# Patient Record
Sex: Male | Born: 1935 | Race: White | Hispanic: No
Health system: Southern US, Community
[De-identification: ages and names within clinical notes are randomized; demographics above are authoritative.]

## PROBLEM LIST (undated history)

## (undated) DIAGNOSIS — F411 Generalized anxiety disorder: Secondary | ICD-10-CM

## (undated) DIAGNOSIS — I10 Essential (primary) hypertension: Secondary | ICD-10-CM

## (undated) DIAGNOSIS — N400 Enlarged prostate without lower urinary tract symptoms: Secondary | ICD-10-CM

## (undated) DIAGNOSIS — I4891 Unspecified atrial fibrillation: Secondary | ICD-10-CM

## (undated) DIAGNOSIS — R351 Nocturia: Secondary | ICD-10-CM

## (undated) DIAGNOSIS — E039 Hypothyroidism, unspecified: Secondary | ICD-10-CM

## (undated) DIAGNOSIS — R599 Enlarged lymph nodes, unspecified: Secondary | ICD-10-CM

## (undated) DIAGNOSIS — J189 Pneumonia, unspecified organism: Secondary | ICD-10-CM

## (undated) DIAGNOSIS — Z87891 Personal history of nicotine dependence: Secondary | ICD-10-CM

## (undated) DIAGNOSIS — I509 Heart failure, unspecified: Secondary | ICD-10-CM

## (undated) DIAGNOSIS — Z950 Presence of cardiac pacemaker: Secondary | ICD-10-CM

## (undated) HISTORY — DX: Pneumonia, unspecified organism: J18.9

## (undated) HISTORY — PX: COLONOSCOPY: SHX174

## (undated) HISTORY — DX: Essential (primary) hypertension: I10

## (undated) HISTORY — DX: Hypothyroidism, unspecified: E03.9

## (undated) HISTORY — DX: Generalized anxiety disorder: F41.1

## (undated) HISTORY — PX: EYE SURGERY: SHX253

## (undated) HISTORY — DX: Presence of cardiac pacemaker: Z95.0

## (undated) HISTORY — DX: Unspecified atrial fibrillation: I48.91

## (undated) HISTORY — DX: Enlarged lymph nodes, unspecified: R59.9

## (undated) HISTORY — PX: HERNIA REPAIR: SHX51

## (undated) HISTORY — DX: Personal history of nicotine dependence: Z87.891

---

## 1998-07-17 ENCOUNTER — Inpatient Hospital Stay (HOSPITAL_COMMUNITY): Admission: EM | Admit: 1998-07-17 | Discharge: 1998-07-20 | Payer: Self-pay | Admitting: *Deleted

## 1998-09-17 HISTORY — PX: EYE SURGERY: SHX253

## 1998-12-21 ENCOUNTER — Ambulatory Visit (HOSPITAL_COMMUNITY): Admission: RE | Admit: 1998-12-21 | Discharge: 1998-12-21 | Payer: Self-pay | Admitting: Cardiology

## 1999-02-16 ENCOUNTER — Encounter: Payer: Self-pay | Admitting: Emergency Medicine

## 1999-02-16 ENCOUNTER — Inpatient Hospital Stay (HOSPITAL_COMMUNITY): Admission: EM | Admit: 1999-02-16 | Discharge: 1999-02-17 | Payer: Self-pay | Admitting: Emergency Medicine

## 1999-12-12 ENCOUNTER — Ambulatory Visit (HOSPITAL_COMMUNITY): Admission: RE | Admit: 1999-12-12 | Discharge: 1999-12-12 | Payer: Self-pay | Admitting: Cardiology

## 2000-11-14 ENCOUNTER — Encounter: Payer: Self-pay | Admitting: Urology

## 2000-11-15 ENCOUNTER — Ambulatory Visit (HOSPITAL_COMMUNITY): Admission: RE | Admit: 2000-11-15 | Discharge: 2000-11-15 | Payer: Self-pay | Admitting: Urology

## 2000-11-15 ENCOUNTER — Encounter (INDEPENDENT_AMBULATORY_CARE_PROVIDER_SITE_OTHER): Payer: Self-pay | Admitting: Specialist

## 2000-11-19 ENCOUNTER — Ambulatory Visit (HOSPITAL_COMMUNITY): Admission: RE | Admit: 2000-11-19 | Discharge: 2000-11-19 | Payer: Self-pay | Admitting: Urology

## 2000-11-19 ENCOUNTER — Encounter: Payer: Self-pay | Admitting: Urology

## 2002-12-23 ENCOUNTER — Ambulatory Visit (HOSPITAL_COMMUNITY): Admission: RE | Admit: 2002-12-23 | Discharge: 2002-12-23 | Payer: Self-pay | Admitting: Gastroenterology

## 2002-12-23 ENCOUNTER — Encounter (INDEPENDENT_AMBULATORY_CARE_PROVIDER_SITE_OTHER): Payer: Self-pay

## 2004-02-05 ENCOUNTER — Emergency Department (HOSPITAL_COMMUNITY): Admission: EM | Admit: 2004-02-05 | Discharge: 2004-02-05 | Payer: Self-pay | Admitting: Emergency Medicine

## 2004-03-06 ENCOUNTER — Emergency Department (HOSPITAL_COMMUNITY): Admission: EM | Admit: 2004-03-06 | Discharge: 2004-03-06 | Payer: Self-pay | Admitting: Emergency Medicine

## 2004-07-20 ENCOUNTER — Ambulatory Visit: Payer: Self-pay | Admitting: Internal Medicine

## 2004-07-26 ENCOUNTER — Ambulatory Visit: Payer: Self-pay | Admitting: Internal Medicine

## 2004-08-11 ENCOUNTER — Ambulatory Visit: Payer: Self-pay

## 2004-08-17 ENCOUNTER — Ambulatory Visit: Payer: Self-pay | Admitting: Internal Medicine

## 2004-09-14 ENCOUNTER — Ambulatory Visit: Payer: Self-pay | Admitting: *Deleted

## 2004-09-19 ENCOUNTER — Ambulatory Visit (HOSPITAL_COMMUNITY): Admission: RE | Admit: 2004-09-19 | Discharge: 2004-09-19 | Payer: Self-pay | Admitting: Internal Medicine

## 2004-09-19 ENCOUNTER — Ambulatory Visit: Payer: Self-pay | Admitting: Internal Medicine

## 2004-10-05 ENCOUNTER — Ambulatory Visit: Payer: Self-pay | Admitting: Cardiology

## 2004-10-14 ENCOUNTER — Ambulatory Visit: Payer: Self-pay | Admitting: Internal Medicine

## 2004-10-25 ENCOUNTER — Ambulatory Visit: Payer: Self-pay | Admitting: Internal Medicine

## 2004-10-26 ENCOUNTER — Ambulatory Visit: Payer: Self-pay | Admitting: Cardiology

## 2004-11-09 ENCOUNTER — Ambulatory Visit: Payer: Self-pay | Admitting: Internal Medicine

## 2004-11-12 ENCOUNTER — Emergency Department (HOSPITAL_COMMUNITY): Admission: EM | Admit: 2004-11-12 | Discharge: 2004-11-13 | Payer: Self-pay | Admitting: Emergency Medicine

## 2004-11-20 ENCOUNTER — Ambulatory Visit: Payer: Self-pay | Admitting: Internal Medicine

## 2004-11-24 ENCOUNTER — Ambulatory Visit (HOSPITAL_COMMUNITY): Admission: RE | Admit: 2004-11-24 | Discharge: 2004-11-24 | Payer: Self-pay | Admitting: Internal Medicine

## 2004-11-30 ENCOUNTER — Ambulatory Visit: Payer: Self-pay | Admitting: Cardiology

## 2004-12-07 ENCOUNTER — Ambulatory Visit: Payer: Self-pay | Admitting: Cardiology

## 2004-12-14 ENCOUNTER — Ambulatory Visit: Payer: Self-pay | Admitting: Internal Medicine

## 2004-12-21 ENCOUNTER — Ambulatory Visit: Payer: Self-pay | Admitting: Internal Medicine

## 2004-12-22 ENCOUNTER — Ambulatory Visit: Payer: Self-pay | Admitting: Internal Medicine

## 2004-12-26 ENCOUNTER — Ambulatory Visit (HOSPITAL_COMMUNITY): Admission: RE | Admit: 2004-12-26 | Discharge: 2004-12-26 | Payer: Self-pay | Admitting: Internal Medicine

## 2004-12-26 ENCOUNTER — Ambulatory Visit: Payer: Self-pay | Admitting: Internal Medicine

## 2005-01-02 ENCOUNTER — Ambulatory Visit: Payer: Self-pay | Admitting: Cardiology

## 2005-01-11 ENCOUNTER — Ambulatory Visit: Payer: Self-pay | Admitting: Cardiology

## 2005-01-18 ENCOUNTER — Ambulatory Visit: Payer: Self-pay | Admitting: Internal Medicine

## 2005-01-22 ENCOUNTER — Ambulatory Visit: Payer: Self-pay | Admitting: Internal Medicine

## 2005-02-08 ENCOUNTER — Ambulatory Visit: Payer: Self-pay | Admitting: Cardiology

## 2005-03-08 ENCOUNTER — Ambulatory Visit: Payer: Self-pay | Admitting: Cardiology

## 2005-04-05 ENCOUNTER — Ambulatory Visit: Payer: Self-pay | Admitting: Cardiology

## 2005-04-18 ENCOUNTER — Ambulatory Visit: Payer: Self-pay | Admitting: Internal Medicine

## 2005-04-25 ENCOUNTER — Ambulatory Visit: Payer: Self-pay | Admitting: Internal Medicine

## 2005-05-03 ENCOUNTER — Ambulatory Visit: Payer: Self-pay | Admitting: Internal Medicine

## 2005-05-23 ENCOUNTER — Ambulatory Visit: Payer: Self-pay | Admitting: Internal Medicine

## 2005-05-28 ENCOUNTER — Ambulatory Visit: Payer: Self-pay | Admitting: Internal Medicine

## 2005-05-30 ENCOUNTER — Ambulatory Visit: Payer: Self-pay | Admitting: Internal Medicine

## 2005-06-04 ENCOUNTER — Ambulatory Visit: Payer: Self-pay | Admitting: Cardiology

## 2005-06-04 ENCOUNTER — Ambulatory Visit: Payer: Self-pay | Admitting: Internal Medicine

## 2005-06-11 ENCOUNTER — Ambulatory Visit: Payer: Self-pay | Admitting: Internal Medicine

## 2005-06-18 ENCOUNTER — Ambulatory Visit: Payer: Self-pay | Admitting: Cardiology

## 2005-06-25 ENCOUNTER — Ambulatory Visit: Payer: Self-pay | Admitting: Cardiology

## 2005-07-11 ENCOUNTER — Ambulatory Visit: Payer: Self-pay | Admitting: Cardiology

## 2005-07-16 ENCOUNTER — Ambulatory Visit: Payer: Self-pay | Admitting: Internal Medicine

## 2005-07-17 ENCOUNTER — Ambulatory Visit: Payer: Self-pay | Admitting: Internal Medicine

## 2005-07-23 ENCOUNTER — Ambulatory Visit: Payer: Self-pay | Admitting: Internal Medicine

## 2005-08-01 ENCOUNTER — Ambulatory Visit: Payer: Self-pay | Admitting: Internal Medicine

## 2005-08-05 ENCOUNTER — Inpatient Hospital Stay (HOSPITAL_COMMUNITY): Admission: EM | Admit: 2005-08-05 | Discharge: 2005-08-08 | Payer: Self-pay | Admitting: Internal Medicine

## 2005-08-07 ENCOUNTER — Ambulatory Visit: Payer: Self-pay | Admitting: Cardiology

## 2005-08-10 ENCOUNTER — Ambulatory Visit: Payer: Self-pay

## 2005-08-20 ENCOUNTER — Ambulatory Visit: Payer: Self-pay | Admitting: Cardiology

## 2005-08-29 ENCOUNTER — Ambulatory Visit: Payer: Self-pay | Admitting: Internal Medicine

## 2005-08-30 ENCOUNTER — Ambulatory Visit: Payer: Self-pay | Admitting: *Deleted

## 2005-09-11 ENCOUNTER — Ambulatory Visit: Payer: Self-pay | Admitting: Internal Medicine

## 2005-10-01 ENCOUNTER — Ambulatory Visit: Payer: Self-pay | Admitting: Internal Medicine

## 2005-10-08 ENCOUNTER — Ambulatory Visit: Payer: Self-pay | Admitting: Internal Medicine

## 2005-10-16 ENCOUNTER — Ambulatory Visit (HOSPITAL_COMMUNITY): Admission: RE | Admit: 2005-10-16 | Discharge: 2005-10-16 | Payer: Self-pay | Admitting: Internal Medicine

## 2005-10-16 ENCOUNTER — Ambulatory Visit: Payer: Self-pay | Admitting: Internal Medicine

## 2005-10-16 HISTORY — PX: PACEMAKER INSERTION: SHX728

## 2005-10-31 ENCOUNTER — Ambulatory Visit: Payer: Self-pay

## 2005-11-05 ENCOUNTER — Ambulatory Visit: Payer: Self-pay | Admitting: Cardiology

## 2005-12-03 ENCOUNTER — Ambulatory Visit: Payer: Self-pay | Admitting: Internal Medicine

## 2005-12-24 ENCOUNTER — Ambulatory Visit: Payer: Self-pay | Admitting: Internal Medicine

## 2005-12-31 ENCOUNTER — Ambulatory Visit: Payer: Self-pay | Admitting: Internal Medicine

## 2006-01-14 ENCOUNTER — Ambulatory Visit: Payer: Self-pay | Admitting: Cardiology

## 2006-01-22 ENCOUNTER — Ambulatory Visit: Payer: Self-pay | Admitting: Internal Medicine

## 2006-02-12 ENCOUNTER — Ambulatory Visit: Payer: Self-pay | Admitting: Internal Medicine

## 2006-03-08 ENCOUNTER — Ambulatory Visit: Payer: Self-pay | Admitting: Internal Medicine

## 2006-03-11 ENCOUNTER — Ambulatory Visit: Payer: Self-pay | Admitting: Cardiology

## 2006-03-22 ENCOUNTER — Ambulatory Visit: Payer: Self-pay | Admitting: Internal Medicine

## 2006-04-08 ENCOUNTER — Ambulatory Visit: Payer: Self-pay | Admitting: Cardiology

## 2006-04-30 ENCOUNTER — Ambulatory Visit: Payer: Self-pay | Admitting: Cardiology

## 2006-05-17 ENCOUNTER — Ambulatory Visit (HOSPITAL_COMMUNITY): Admission: RE | Admit: 2006-05-17 | Discharge: 2006-05-17 | Payer: Self-pay | Admitting: Internal Medicine

## 2006-05-17 ENCOUNTER — Ambulatory Visit: Payer: Self-pay | Admitting: Internal Medicine

## 2006-05-21 ENCOUNTER — Ambulatory Visit: Payer: Self-pay | Admitting: Cardiovascular Disease

## 2006-06-10 ENCOUNTER — Ambulatory Visit: Payer: Self-pay | Admitting: Internal Medicine

## 2006-06-10 ENCOUNTER — Ambulatory Visit: Payer: Self-pay | Admitting: Cardiology

## 2006-06-19 ENCOUNTER — Ambulatory Visit: Payer: Self-pay | Admitting: Internal Medicine

## 2006-07-08 ENCOUNTER — Ambulatory Visit: Payer: Self-pay | Admitting: Cardiovascular Disease

## 2006-07-26 ENCOUNTER — Ambulatory Visit: Payer: Self-pay | Admitting: Cardiology

## 2006-08-05 ENCOUNTER — Ambulatory Visit: Payer: Self-pay | Admitting: Internal Medicine

## 2006-08-05 ENCOUNTER — Ambulatory Visit: Payer: Self-pay | Admitting: Cardiology

## 2006-08-12 ENCOUNTER — Ambulatory Visit: Payer: Self-pay | Admitting: Cardiology

## 2006-09-12 ENCOUNTER — Ambulatory Visit (HOSPITAL_COMMUNITY): Admission: RE | Admit: 2006-09-12 | Discharge: 2006-09-12 | Payer: Self-pay | Admitting: Internal Medicine

## 2006-09-12 ENCOUNTER — Ambulatory Visit: Payer: Self-pay | Admitting: Internal Medicine

## 2006-09-20 ENCOUNTER — Ambulatory Visit: Payer: Self-pay | Admitting: Internal Medicine

## 2006-09-25 ENCOUNTER — Ambulatory Visit: Payer: Self-pay | Admitting: Internal Medicine

## 2006-09-25 ENCOUNTER — Ambulatory Visit (HOSPITAL_COMMUNITY): Admission: RE | Admit: 2006-09-25 | Discharge: 2006-09-26 | Payer: Self-pay | Admitting: Internal Medicine

## 2006-10-02 ENCOUNTER — Ambulatory Visit: Payer: Self-pay | Admitting: Internal Medicine

## 2006-10-02 LAB — CONVERTED CEMR LAB
ALT: 16 units/L (ref 0–40)
AST: 23 units/L (ref 0–37)
Basophils Relative: 0.4 % (ref 0.0–1.0)
Creatinine, Ser: 0.9 mg/dL (ref 0.4–1.5)
Glucose, Bld: 99 mg/dL (ref 70–99)
HCT: 42.4 % (ref 39.0–52.0)
Hemoglobin: 13.9 g/dL (ref 13.0–17.0)
LDL Cholesterol: 125 mg/dL — ABNORMAL HIGH (ref 0–99)
MCHC: 32.8 g/dL (ref 30.0–36.0)
Magnesium: 2.2 mg/dL (ref 1.5–2.5)
Monocytes Absolute: 0.5 10*3/uL (ref 0.2–0.7)
Neutrophils Relative %: 55.8 % (ref 43.0–77.0)
Potassium: 5.1 meq/L (ref 3.5–5.1)
RBC: 4.16 M/uL — ABNORMAL LOW (ref 4.22–5.81)
RDW: 12.5 % (ref 11.5–14.6)
Sodium: 141 meq/L (ref 135–145)
Total CHOL/HDL Ratio: 3.6
VLDL: 15 mg/dL (ref 0–40)
WBC: 5.9 10*3/uL (ref 4.5–10.5)

## 2006-10-03 ENCOUNTER — Ambulatory Visit: Payer: Self-pay | Admitting: Internal Medicine

## 2006-10-09 ENCOUNTER — Ambulatory Visit: Payer: Self-pay

## 2006-10-23 ENCOUNTER — Ambulatory Visit: Payer: Self-pay | Admitting: Internal Medicine

## 2006-10-23 LAB — CONVERTED CEMR LAB
Prothrombin Time: 18.8 s — ABNORMAL HIGH (ref 10.0–14.0)
Vitamin B-12: 928 pg/mL — ABNORMAL HIGH (ref 211–911)

## 2006-11-20 ENCOUNTER — Ambulatory Visit: Payer: Self-pay | Admitting: *Deleted

## 2006-11-28 ENCOUNTER — Ambulatory Visit: Payer: Self-pay | Admitting: Internal Medicine

## 2006-12-11 ENCOUNTER — Ambulatory Visit: Payer: Self-pay | Admitting: Internal Medicine

## 2006-12-11 LAB — CONVERTED CEMR LAB
BUN: 14 mg/dL (ref 6–23)
Calcium: 8.9 mg/dL (ref 8.4–10.5)
Chloride: 105 meq/L (ref 96–112)
GFR calc Af Amer: 139 mL/min
GFR calc non Af Amer: 115 mL/min

## 2006-12-18 ENCOUNTER — Ambulatory Visit: Payer: Self-pay | Admitting: Cardiovascular Disease

## 2006-12-18 ENCOUNTER — Ambulatory Visit: Payer: Self-pay | Admitting: Internal Medicine

## 2007-01-06 ENCOUNTER — Ambulatory Visit: Payer: Self-pay | Admitting: Internal Medicine

## 2007-01-20 ENCOUNTER — Ambulatory Visit: Payer: Self-pay | Admitting: Cardiovascular Disease

## 2007-02-12 ENCOUNTER — Ambulatory Visit: Payer: Self-pay | Admitting: Internal Medicine

## 2007-02-13 ENCOUNTER — Ambulatory Visit: Payer: Self-pay | Admitting: Internal Medicine

## 2007-02-13 LAB — CONVERTED CEMR LAB
Basophils Relative: 0.3 % (ref 0.0–1.0)
CO2: 33 meq/L — ABNORMAL HIGH (ref 19–32)
Creatinine, Ser: 0.9 mg/dL (ref 0.4–1.5)
Eosinophils Relative: 3.3 % (ref 0.0–5.0)
Glucose, Bld: 95 mg/dL (ref 70–99)
HCT: 39.8 % (ref 39.0–52.0)
Hemoglobin: 13.8 g/dL (ref 13.0–17.0)
Lymphocytes Relative: 30.3 % (ref 12.0–46.0)
Monocytes Absolute: 0.6 10*3/uL (ref 0.2–0.7)
Neutro Abs: 2.8 10*3/uL (ref 1.4–7.7)
Neutrophils Relative %: 55.2 % (ref 43.0–77.0)
Potassium: 5 meq/L (ref 3.5–5.1)
RDW: 12.7 % (ref 11.5–14.6)
Sodium: 141 meq/L (ref 135–145)
Vitamin B-12: 891 pg/mL (ref 211–911)

## 2007-02-17 ENCOUNTER — Ambulatory Visit: Payer: Self-pay | Admitting: Cardiovascular Disease

## 2007-02-26 ENCOUNTER — Ambulatory Visit: Payer: Self-pay | Admitting: Internal Medicine

## 2007-03-17 ENCOUNTER — Ambulatory Visit: Payer: Self-pay | Admitting: Cardiology

## 2007-03-31 ENCOUNTER — Ambulatory Visit: Payer: Self-pay | Admitting: Internal Medicine

## 2007-04-14 ENCOUNTER — Ambulatory Visit: Payer: Self-pay | Admitting: Internal Medicine

## 2007-04-14 DIAGNOSIS — T6391XA Toxic effect of contact with unspecified venomous animal, accidental (unintentional), initial encounter: Secondary | ICD-10-CM | POA: Insufficient documentation

## 2007-04-21 ENCOUNTER — Ambulatory Visit: Payer: Self-pay | Admitting: Internal Medicine

## 2007-04-21 LAB — CONVERTED CEMR LAB
BUN: 13 mg/dL (ref 6–23)
Chloride: 102 meq/L (ref 96–112)
Creatinine, Ser: 0.8 mg/dL (ref 0.4–1.5)

## 2007-04-24 ENCOUNTER — Ambulatory Visit: Payer: Self-pay | Admitting: Internal Medicine

## 2007-04-28 ENCOUNTER — Ambulatory Visit: Payer: Self-pay | Admitting: Cardiology

## 2007-05-12 ENCOUNTER — Ambulatory Visit: Payer: Self-pay | Admitting: Cardiology

## 2007-06-04 ENCOUNTER — Ambulatory Visit: Payer: Self-pay | Admitting: Internal Medicine

## 2007-06-09 ENCOUNTER — Ambulatory Visit: Payer: Self-pay | Admitting: Internal Medicine

## 2007-06-09 LAB — CONVERTED CEMR LAB
INR: 5.6 (ref 0.8–1.0)
Prothrombin Time: 30.8 s (ref 10.9–13.3)

## 2007-06-20 ENCOUNTER — Ambulatory Visit: Payer: Self-pay | Admitting: Internal Medicine

## 2007-06-30 ENCOUNTER — Ambulatory Visit: Payer: Self-pay | Admitting: Internal Medicine

## 2007-07-07 ENCOUNTER — Ambulatory Visit: Payer: Self-pay | Admitting: Internal Medicine

## 2007-07-28 ENCOUNTER — Ambulatory Visit: Payer: Self-pay | Admitting: Internal Medicine

## 2007-08-11 ENCOUNTER — Ambulatory Visit: Payer: Self-pay | Admitting: Cardiology

## 2007-09-01 ENCOUNTER — Ambulatory Visit: Payer: Self-pay | Admitting: Internal Medicine

## 2007-09-04 ENCOUNTER — Ambulatory Visit: Payer: Self-pay | Admitting: Internal Medicine

## 2007-09-04 DIAGNOSIS — E039 Hypothyroidism, unspecified: Secondary | ICD-10-CM | POA: Insufficient documentation

## 2007-09-04 HISTORY — DX: Hypothyroidism, unspecified: E03.9

## 2007-09-05 LAB — CONVERTED CEMR LAB
Bilirubin, Direct: 0.2 mg/dL (ref 0.0–0.3)
CO2: 33 meq/L — ABNORMAL HIGH (ref 19–32)
Chloride: 100 meq/L (ref 96–112)
Sodium: 137 meq/L (ref 135–145)
TSH: 0.87 microintl units/mL (ref 0.35–5.50)
Total Protein: 6.8 g/dL (ref 6.0–8.3)

## 2007-09-29 ENCOUNTER — Ambulatory Visit: Payer: Self-pay | Admitting: Cardiology

## 2007-10-03 ENCOUNTER — Ambulatory Visit: Payer: Self-pay | Admitting: Internal Medicine

## 2007-10-03 DIAGNOSIS — I10 Essential (primary) hypertension: Secondary | ICD-10-CM

## 2007-10-03 HISTORY — DX: Essential (primary) hypertension: I10

## 2007-10-17 LAB — CONVERTED CEMR LAB
BUN: 12 mg/dL (ref 6–23)
GFR calc Af Amer: 104 mL/min
Potassium: 5.8 meq/L — ABNORMAL HIGH (ref 3.5–5.1)

## 2007-10-20 ENCOUNTER — Ambulatory Visit: Payer: Self-pay | Admitting: Internal Medicine

## 2007-10-27 ENCOUNTER — Ambulatory Visit: Payer: Self-pay | Admitting: Internal Medicine

## 2007-10-27 LAB — CONVERTED CEMR LAB
BUN: 17 mg/dL (ref 6–23)
Calcium: 9 mg/dL (ref 8.4–10.5)
GFR calc Af Amer: 139 mL/min
GFR calc non Af Amer: 115 mL/min
Glucose, Bld: 63 mg/dL — ABNORMAL LOW (ref 70–99)
Potassium: 5 meq/L (ref 3.5–5.1)

## 2007-10-28 ENCOUNTER — Ambulatory Visit: Payer: Self-pay | Admitting: Internal Medicine

## 2007-11-24 ENCOUNTER — Ambulatory Visit: Payer: Self-pay | Admitting: Cardiology

## 2007-12-23 ENCOUNTER — Ambulatory Visit: Payer: Self-pay | Admitting: Cardiology

## 2008-01-02 ENCOUNTER — Ambulatory Visit: Payer: Self-pay | Admitting: Internal Medicine

## 2008-01-20 ENCOUNTER — Ambulatory Visit: Payer: Self-pay | Admitting: Cardiology

## 2008-02-17 ENCOUNTER — Ambulatory Visit: Payer: Self-pay | Admitting: Cardiology

## 2008-03-16 ENCOUNTER — Ambulatory Visit: Payer: Self-pay | Admitting: Cardiology

## 2008-04-08 ENCOUNTER — Encounter: Payer: Self-pay | Admitting: Internal Medicine

## 2008-04-13 ENCOUNTER — Ambulatory Visit: Payer: Self-pay | Admitting: Internal Medicine

## 2008-04-19 ENCOUNTER — Ambulatory Visit: Payer: Self-pay | Admitting: Internal Medicine

## 2008-04-19 LAB — CONVERTED CEMR LAB
Albumin: 3.6 g/dL (ref 3.5–5.2)
Alkaline Phosphatase: 68 units/L (ref 39–117)
BUN: 14 mg/dL (ref 6–23)
Calcium: 8.6 mg/dL (ref 8.4–10.5)
Creatinine, Ser: 0.8 mg/dL (ref 0.4–1.5)
Eosinophils Absolute: 0.2 10*3/uL (ref 0.0–0.7)
Eosinophils Relative: 2.5 % (ref 0.0–5.0)
GFR calc Af Amer: 119 mL/min
GFR calc non Af Amer: 98 mL/min
Glucose, Bld: 83 mg/dL (ref 70–99)
HCT: 40.7 % (ref 39.0–52.0)
HDL: 47.7 mg/dL (ref >39.0)
Hemoglobin: 13.7 g/dL (ref 13.0–17.0)
MCV: 101.1 fL — ABNORMAL HIGH (ref 78.0–100.0)
Monocytes Absolute: 0.6 10*3/uL (ref 0.1–1.0)
Neutro Abs: 3.4 10*3/uL (ref 1.4–7.7)
Platelets: 244 10*3/uL (ref 150–400)
Potassium: 5 meq/L (ref 3.5–5.1)
TSH: 0.82 microintl units/mL (ref 0.35–5.50)
Total Protein: 6.3 g/dL (ref 6.0–8.3)
Triglycerides: 55 mg/dL (ref 0–149)
WBC: 6.4 10*3/uL (ref 4.5–10.5)

## 2008-04-26 ENCOUNTER — Ambulatory Visit: Payer: Self-pay | Admitting: Internal Medicine

## 2008-04-26 DIAGNOSIS — F411 Generalized anxiety disorder: Secondary | ICD-10-CM | POA: Insufficient documentation

## 2008-04-26 HISTORY — DX: Generalized anxiety disorder: F41.1

## 2008-05-04 ENCOUNTER — Ambulatory Visit: Payer: Self-pay | Admitting: Internal Medicine

## 2008-05-10 ENCOUNTER — Encounter: Admission: RE | Admit: 2008-05-10 | Discharge: 2008-05-10 | Payer: Self-pay | Admitting: Gastroenterology

## 2008-05-10 ENCOUNTER — Encounter: Payer: Self-pay | Admitting: Internal Medicine

## 2008-05-17 ENCOUNTER — Ambulatory Visit: Payer: Self-pay | Admitting: Cardiology

## 2008-05-25 ENCOUNTER — Ambulatory Visit: Payer: Self-pay | Admitting: Internal Medicine

## 2008-05-25 LAB — CONVERTED CEMR LAB
Chloride: 101 meq/L (ref 96–112)
GFR calc Af Amer: 104 mL/min
GFR calc non Af Amer: 86 mL/min
Potassium: 5.3 meq/L — ABNORMAL HIGH (ref 3.5–5.1)
Sodium: 138 meq/L (ref 135–145)
Vitamin B-12: 1035 pg/mL — ABNORMAL HIGH (ref 211–911)

## 2008-06-09 ENCOUNTER — Telehealth: Payer: Self-pay | Admitting: Internal Medicine

## 2008-06-15 ENCOUNTER — Ambulatory Visit: Payer: Self-pay | Admitting: Cardiology

## 2008-07-09 ENCOUNTER — Ambulatory Visit: Payer: Self-pay | Admitting: Internal Medicine

## 2008-07-09 LAB — CONVERTED CEMR LAB
BUN: 12 mg/dL (ref 6–23)
Basophils Absolute: 0 10*3/uL (ref 0.0–0.1)
Basophils Relative: 0.3 % (ref 0.0–3.0)
CO2: 32 meq/L (ref 19–32)
Chloride: 101 meq/L (ref 96–112)
Creatinine, Ser: 0.7 mg/dL (ref 0.4–1.5)
Eosinophils Absolute: 0.1 10*3/uL (ref 0.0–0.7)
Eosinophils Relative: 2.1 % (ref 0.0–5.0)
GFR calc non Af Amer: 115 mL/min
Lymphocytes Relative: 32.6 % (ref 12.0–46.0)
Neutrophils Relative %: 54.5 % (ref 43.0–77.0)
Platelets: 207 10*3/uL (ref 150–400)
Potassium: 4.8 meq/L (ref 3.5–5.1)
RBC: 4.09 M/uL — ABNORMAL LOW (ref 4.22–5.81)
Vitamin B-12: 941 pg/mL — ABNORMAL HIGH (ref 211–911)
WBC: 5.2 10*3/uL (ref 4.5–10.5)

## 2008-07-13 ENCOUNTER — Ambulatory Visit: Payer: Self-pay | Admitting: Cardiovascular Disease

## 2008-08-10 ENCOUNTER — Ambulatory Visit: Payer: Self-pay | Admitting: Cardiology

## 2008-08-30 ENCOUNTER — Ambulatory Visit: Payer: Self-pay | Admitting: Internal Medicine

## 2008-09-01 LAB — CONVERTED CEMR LAB
AST: 26 units/L (ref 0–37)
Alkaline Phosphatase: 61 units/L (ref 39–117)
BUN: 12 mg/dL (ref 6–23)
Bilirubin, Direct: 0.2 mg/dL (ref 0.0–0.3)
Chloride: 105 meq/L (ref 96–112)
GFR calc Af Amer: 139 mL/min
GFR calc non Af Amer: 114 mL/min
Potassium: 4.7 meq/L (ref 3.5–5.1)
Sodium: 140 meq/L (ref 135–145)

## 2008-09-07 ENCOUNTER — Ambulatory Visit: Payer: Self-pay | Admitting: Internal Medicine

## 2008-10-06 ENCOUNTER — Ambulatory Visit: Payer: Self-pay | Admitting: Internal Medicine

## 2008-10-19 ENCOUNTER — Ambulatory Visit: Payer: Self-pay | Admitting: Internal Medicine

## 2008-11-03 ENCOUNTER — Ambulatory Visit: Payer: Self-pay | Admitting: Cardiology

## 2008-11-17 ENCOUNTER — Ambulatory Visit: Payer: Self-pay

## 2008-12-08 ENCOUNTER — Ambulatory Visit: Payer: Self-pay | Admitting: Internal Medicine

## 2008-12-30 ENCOUNTER — Ambulatory Visit: Payer: Self-pay | Admitting: Internal Medicine

## 2008-12-30 DIAGNOSIS — R1011 Right upper quadrant pain: Secondary | ICD-10-CM | POA: Insufficient documentation

## 2008-12-31 ENCOUNTER — Encounter (INDEPENDENT_AMBULATORY_CARE_PROVIDER_SITE_OTHER): Payer: Self-pay

## 2009-01-05 ENCOUNTER — Ambulatory Visit: Payer: Self-pay | Admitting: Internal Medicine

## 2009-01-06 ENCOUNTER — Encounter: Admission: RE | Admit: 2009-01-06 | Discharge: 2009-01-06 | Payer: Self-pay | Admitting: Internal Medicine

## 2009-01-20 ENCOUNTER — Encounter: Payer: Self-pay | Admitting: Internal Medicine

## 2009-01-21 ENCOUNTER — Ambulatory Visit: Payer: Self-pay | Admitting: Cardiovascular Disease

## 2009-01-31 ENCOUNTER — Ambulatory Visit: Payer: Self-pay | Admitting: Cardiovascular Disease

## 2009-02-11 ENCOUNTER — Ambulatory Visit: Payer: Self-pay | Admitting: Internal Medicine

## 2009-02-11 LAB — CONVERTED CEMR LAB: POC INR: 2.3

## 2009-02-15 ENCOUNTER — Encounter: Payer: Self-pay | Admitting: *Deleted

## 2009-03-04 ENCOUNTER — Encounter (INDEPENDENT_AMBULATORY_CARE_PROVIDER_SITE_OTHER): Payer: Self-pay | Admitting: Pharmacist

## 2009-03-04 ENCOUNTER — Ambulatory Visit: Payer: Self-pay | Admitting: Internal Medicine

## 2009-03-04 LAB — CONVERTED CEMR LAB
POC INR: 1.9
Protime: 16.8

## 2009-03-23 ENCOUNTER — Encounter: Payer: Self-pay | Admitting: *Deleted

## 2009-03-25 ENCOUNTER — Ambulatory Visit: Payer: Self-pay | Admitting: Cardiology

## 2009-03-25 LAB — CONVERTED CEMR LAB
INR: 2.7
POC INR: 2.7
Prothrombin Time: 19.9 s

## 2009-04-01 ENCOUNTER — Ambulatory Visit: Payer: Self-pay | Admitting: Internal Medicine

## 2009-04-01 LAB — CONVERTED CEMR LAB
AST: 24 units/L (ref 0–37)
Albumin: 3.7 g/dL (ref 3.5–5.2)
BUN: 10 mg/dL (ref 6–23)
Bilirubin, Direct: 0.2 mg/dL (ref 0.0–0.3)
Calcium: 8.6 mg/dL (ref 8.4–10.5)
Creatinine, Ser: 0.7 mg/dL (ref 0.4–1.5)
GFR calc non Af Amer: 114.28 mL/min (ref >60)
Hemoglobin, Urine: NEGATIVE
Ketones, ur: NEGATIVE mg/dL
Total Bilirubin: 1.2 mg/dL (ref 0.3–1.2)
Total Protein, Urine: NEGATIVE mg/dL
Total Protein: 6.5 g/dL (ref 6.0–8.3)
Urine Glucose: NEGATIVE mg/dL
Urobilinogen, UA: 0.2 (ref 0.0–1.0)

## 2009-04-06 ENCOUNTER — Ambulatory Visit: Payer: Self-pay | Admitting: Internal Medicine

## 2009-04-06 DIAGNOSIS — R599 Enlarged lymph nodes, unspecified: Secondary | ICD-10-CM

## 2009-04-06 HISTORY — DX: Enlarged lymph nodes, unspecified: R59.9

## 2009-04-20 ENCOUNTER — Ambulatory Visit: Payer: Self-pay

## 2009-04-20 ENCOUNTER — Encounter: Payer: Self-pay | Admitting: Internal Medicine

## 2009-04-26 ENCOUNTER — Encounter: Payer: Self-pay | Admitting: Cardiology

## 2009-04-26 ENCOUNTER — Ambulatory Visit: Payer: Self-pay | Admitting: Internal Medicine

## 2009-04-26 LAB — CONVERTED CEMR LAB: POC INR: 1.9

## 2009-05-03 ENCOUNTER — Ambulatory Visit: Payer: Self-pay | Admitting: Internal Medicine

## 2009-05-03 ENCOUNTER — Encounter (INDEPENDENT_AMBULATORY_CARE_PROVIDER_SITE_OTHER): Payer: Self-pay | Admitting: *Deleted

## 2009-05-03 LAB — CONVERTED CEMR LAB
Basophils Absolute: 0 10*3/uL (ref 0.0–0.1)
Basophils Relative: 0.2 % (ref 0.0–3.0)
HCT: 41 % (ref 39.0–52.0)
Hemoglobin: 14 g/dL (ref 13.0–17.0)
Lymphs Abs: 1.8 10*3/uL (ref 0.7–4.0)
Monocytes Relative: 8.2 % (ref 3.0–12.0)
Neutro Abs: 2.7 10*3/uL (ref 1.4–7.7)
RBC: 4.1 M/uL — ABNORMAL LOW (ref 4.22–5.81)
RDW: 12.7 % (ref 11.5–14.6)

## 2009-05-09 ENCOUNTER — Ambulatory Visit: Payer: Self-pay | Admitting: Internal Medicine

## 2009-05-24 ENCOUNTER — Ambulatory Visit: Payer: Self-pay | Admitting: Cardiology

## 2009-06-21 ENCOUNTER — Ambulatory Visit: Payer: Self-pay | Admitting: Cardiology

## 2009-06-21 LAB — CONVERTED CEMR LAB: POC INR: 2.9

## 2009-07-07 ENCOUNTER — Ambulatory Visit: Payer: Self-pay | Admitting: Internal Medicine

## 2009-07-08 LAB — CONVERTED CEMR LAB
Basophils Absolute: 0 10*3/uL (ref 0.0–0.1)
Basophils Relative: 0.6 % (ref 0.0–3.0)
CO2: 33 meq/L — ABNORMAL HIGH (ref 19–32)
Calcium: 8.8 mg/dL (ref 8.4–10.5)
Chloride: 98 meq/L (ref 96–112)
Eosinophils Absolute: 0.2 10*3/uL (ref 0.0–0.7)
Glucose, Bld: 93 mg/dL (ref 70–99)
HCT: 41.5 % (ref 39.0–52.0)
Hemoglobin: 14.4 g/dL (ref 13.0–17.0)
Lymphs Abs: 2 10*3/uL (ref 0.7–4.0)
MCHC: 34.6 g/dL (ref 30.0–36.0)
MCV: 102.4 fL — ABNORMAL HIGH (ref 78.0–100.0)
Monocytes Absolute: 0.6 10*3/uL (ref 0.1–1.0)
Neutro Abs: 2.5 10*3/uL (ref 1.4–7.7)
Potassium: 5.3 meq/L — ABNORMAL HIGH (ref 3.5–5.1)
RBC: 4.05 M/uL — ABNORMAL LOW (ref 4.22–5.81)
RDW: 12.4 % (ref 11.5–14.6)
Sodium: 136 meq/L (ref 135–145)

## 2009-07-14 ENCOUNTER — Ambulatory Visit: Payer: Self-pay | Admitting: Internal Medicine

## 2009-07-14 DIAGNOSIS — Z87891 Personal history of nicotine dependence: Secondary | ICD-10-CM

## 2009-07-14 DIAGNOSIS — R799 Abnormal finding of blood chemistry, unspecified: Secondary | ICD-10-CM | POA: Insufficient documentation

## 2009-07-14 HISTORY — DX: Personal history of nicotine dependence: Z87.891

## 2009-07-19 ENCOUNTER — Ambulatory Visit: Payer: Self-pay | Admitting: Internal Medicine

## 2009-08-16 ENCOUNTER — Ambulatory Visit: Payer: Self-pay | Admitting: Cardiology

## 2009-08-16 LAB — CONVERTED CEMR LAB: POC INR: 2.9

## 2009-09-13 ENCOUNTER — Ambulatory Visit: Payer: Self-pay | Admitting: Cardiology

## 2009-09-23 ENCOUNTER — Ambulatory Visit: Payer: Self-pay | Admitting: Cardiology

## 2009-10-06 ENCOUNTER — Ambulatory Visit: Payer: Self-pay | Admitting: Internal Medicine

## 2009-10-07 LAB — CONVERTED CEMR LAB
Basophils Relative: 0.1 % (ref 0.0–3.0)
CO2: 31 meq/L (ref 19–32)
Calcium: 9.2 mg/dL (ref 8.4–10.5)
Eosinophils Relative: 2.8 % (ref 0.0–5.0)
Glucose, Bld: 98 mg/dL (ref 70–99)
Hemoglobin: 13.1 g/dL (ref 13.0–17.0)
Lymphocytes Relative: 34.4 % (ref 12.0–46.0)
MCHC: 33.3 g/dL (ref 30.0–36.0)
MCV: 102.7 fL — ABNORMAL HIGH (ref 78.0–100.0)
Neutro Abs: 2.6 10*3/uL (ref 1.4–7.7)
Neutrophils Relative %: 51 % (ref 43.0–77.0)
Potassium: 5.1 meq/L (ref 3.5–5.1)
RBC: 3.84 M/uL — ABNORMAL LOW (ref 4.22–5.81)
Sodium: 138 meq/L (ref 135–145)
TSH: 1.08 microintl units/mL (ref 0.35–5.50)
WBC: 5 10*3/uL (ref 4.5–10.5)

## 2009-10-13 ENCOUNTER — Ambulatory Visit: Payer: Self-pay | Admitting: Internal Medicine

## 2009-10-25 ENCOUNTER — Ambulatory Visit: Payer: Self-pay | Admitting: Internal Medicine

## 2009-11-10 ENCOUNTER — Ambulatory Visit: Payer: Self-pay | Admitting: Cardiology

## 2009-11-10 LAB — CONVERTED CEMR LAB: POC INR: 2.1

## 2009-12-08 ENCOUNTER — Ambulatory Visit: Payer: Self-pay | Admitting: Cardiology

## 2009-12-08 LAB — CONVERTED CEMR LAB: POC INR: 2

## 2010-01-09 ENCOUNTER — Ambulatory Visit: Payer: Self-pay | Admitting: Cardiovascular Disease

## 2010-01-09 ENCOUNTER — Ambulatory Visit: Payer: Self-pay | Admitting: Internal Medicine

## 2010-01-09 LAB — CONVERTED CEMR LAB
Basophils Relative: 0.3 % (ref 0.0–3.0)
Chloride: 101 meq/L (ref 96–112)
Eosinophils Relative: 2.6 % (ref 0.0–5.0)
GFR calc non Af Amer: 97.78 mL/min (ref >60)
HCT: 39.5 % (ref 39.0–52.0)
Hemoglobin: 13.7 g/dL (ref 13.0–17.0)
Lymphs Abs: 1.9 10*3/uL (ref 0.7–4.0)
MCV: 99.4 fL (ref 78.0–100.0)
Monocytes Absolute: 0.4 10*3/uL (ref 0.1–1.0)
Monocytes Relative: 9 % (ref 3.0–12.0)
Neutro Abs: 2.1 10*3/uL (ref 1.4–7.7)
Potassium: 4.9 meq/L (ref 3.5–5.1)
RBC: 3.97 M/uL — ABNORMAL LOW (ref 4.22–5.81)
Sodium: 139 meq/L (ref 135–145)
WBC: 4.5 10*3/uL (ref 4.5–10.5)

## 2010-01-10 ENCOUNTER — Ambulatory Visit: Payer: Self-pay | Admitting: Internal Medicine

## 2010-01-31 ENCOUNTER — Telehealth: Payer: Self-pay | Admitting: Internal Medicine

## 2010-02-06 ENCOUNTER — Ambulatory Visit: Payer: Self-pay | Admitting: Cardiology

## 2010-02-06 LAB — CONVERTED CEMR LAB: POC INR: 2.2

## 2010-03-06 ENCOUNTER — Ambulatory Visit: Payer: Self-pay | Admitting: Cardiology

## 2010-04-03 ENCOUNTER — Ambulatory Visit: Payer: Self-pay | Admitting: Cardiology

## 2010-04-17 ENCOUNTER — Encounter: Payer: Self-pay | Admitting: Internal Medicine

## 2010-04-17 ENCOUNTER — Ambulatory Visit: Payer: Self-pay

## 2010-05-01 ENCOUNTER — Ambulatory Visit: Payer: Self-pay | Admitting: Cardiology

## 2010-05-01 LAB — CONVERTED CEMR LAB: POC INR: 2.8

## 2010-05-29 ENCOUNTER — Ambulatory Visit: Payer: Self-pay | Admitting: Internal Medicine

## 2010-05-29 LAB — CONVERTED CEMR LAB: POC INR: 2.7

## 2010-06-26 ENCOUNTER — Ambulatory Visit: Payer: Self-pay | Admitting: Cardiology

## 2010-06-26 LAB — CONVERTED CEMR LAB: POC INR: 2.3

## 2010-07-14 ENCOUNTER — Ambulatory Visit: Payer: Self-pay | Admitting: Internal Medicine

## 2010-07-24 ENCOUNTER — Ambulatory Visit: Payer: Self-pay | Admitting: Internal Medicine

## 2010-07-24 LAB — CONVERTED CEMR LAB: POC INR: 2.6

## 2010-08-22 ENCOUNTER — Ambulatory Visit: Payer: Self-pay | Admitting: Cardiovascular Disease

## 2010-08-22 LAB — CONVERTED CEMR LAB: POC INR: 2.9

## 2010-09-26 ENCOUNTER — Ambulatory Visit: Admission: RE | Admit: 2010-09-26 | Discharge: 2010-09-26 | Payer: Self-pay | Source: Home / Self Care

## 2010-10-19 NOTE — Medication Information (Signed)
Summary: rov/sp  Anticoagulant Therapy  Managed by: Bethena Midget, RN, BSN Referring MD: Daisey Must PCP: Posey Rea, MD Supervising MD: Jens Som MD, Arlys John Indication 1: Atrial Fibrillation (ICD-427.31) Lab Used: [facility] Site: [facility] INR POC 2.2 INR RANGE 2 - 3  Dietary changes: no    Health status changes: no    Bleeding/hemorrhagic complications: no    Recent/future hospitalizations: no    Any changes in medication regimen? no    Recent/future dental: no  Any missed doses?: no       Is patient compliant with meds? yes      Comments: On June 1st having extractions and needs to stop 2 days prior, has been cleared to do so.   Allergies: 1)  ! Welchol (Colesevelam Hcl) 2)  ! Zetia (Ezetimibe) 3)  ! * Clonidine 4)  ! Clonidine Hcl (Clonidine Hcl) 5)  ! Lipitor (Atorvastatin Calcium) 6)  ! Osteo Bi-Flex Adv Double St (Misc Natural Products) 7)  ! Lotensin 8)  ! Cardizem (Diltiazem Hcl) 9)  ! Amoxicillin (Amoxicillin) 10)  ! Amiodarone Hcl (Amiodarone Hcl)  Anticoagulation Management History:      The patient is taking warfarin and comes in today for a routine follow up visit.  Positive risk factors for bleeding include an age of 1 years or older.  The bleeding index is 'intermediate risk'.  Positive CHADS2 values include History of HTN and Age > 59 years old.  The start date was 06/11/2003.  His last INR was 2.7.  Anticoagulation responsible provider: Jens Som MD, Arlys John.  INR POC: 2.2.  Cuvette Lot#: [id number].  Exp: 04/2011.    Anticoagulation Management Assessment/Plan:      The patient's current anticoagulation dose is Coumadin 5 mg tabs: as dirrected.  The target INR is 2 - 3.  The next INR is due 03/06/2010.  Anticoagulation instructions were given to patient.  Results were reviewed/authorized by Bethena Midget, RN, BSN.  He was notified by Bethena Midget, RN, BSN.         Prior Anticoagulation Instructions: INR 2.8  Continue same dose of 1 tablet every day    Current Anticoagulation Instructions: INR 2.2 Continue 5mg s daily. When you resume take 7.5mg s for 2days. Recheck in 4 weeks.

## 2010-10-19 NOTE — Cardiovascular Report (Signed)
Summary: Office Visit   Office Visit   Imported By: Roderic Ovens 05/03/2010 11:34:04  _____________________________________________________________________  External Attachment:    Type:   Image     Comment:   External Document

## 2010-10-19 NOTE — Medication Information (Signed)
Summary: rov/tm  Anticoagulant Therapy  Managed by: Weston Brass, PharmD Referring MD: Daisey Must PCP: Posey Rea, MD Supervising MD: Excell Seltzer MD, Casimiro Needle Indication 1: Atrial Fibrillation (ICD-427.31) Lab Used: [facility] Site: [facility] INR POC 2.8 INR RANGE 2 - 3  Dietary changes: no    Health status changes: no    Bleeding/hemorrhagic complications: no    Recent/future hospitalizations: no    Any changes in medication regimen? no    Recent/future dental: no  Any missed doses?: no       Is patient compliant with meds? yes       Allergies: 1)  ! Welchol (Colesevelam Hcl) 2)  ! Zetia (Ezetimibe) 3)  ! * Clonidine 4)  ! Clonidine Hcl (Clonidine Hcl) 5)  ! Lipitor (Atorvastatin Calcium) 6)  ! Osteo Bi-Flex Adv Double St (Misc Natural Products) 7)  ! Lotensin 8)  ! Cardizem (Diltiazem Hcl) 9)  ! Amoxicillin (Amoxicillin) 10)  ! Amiodarone Hcl (Amiodarone Hcl)  Anticoagulation Management History:      The patient is taking warfarin and comes in today for a routine follow up visit.  Positive risk factors for bleeding include an age of 75 years or older.  The bleeding index is 'intermediate risk'.  Positive CHADS2 values include History of HTN and Age > 75 years old.  The start date was 06/11/2003.  His last INR was 2.7.  Anticoagulation responsible provider: Excell Seltzer MD, Casimiro Needle.  INR POC: 2.8.  Cuvette Lot#: [id number].  Exp: 01/2011.    Anticoagulation Management Assessment/Plan:      The patient's current anticoagulation dose is Coumadin 5 mg tabs: as dirrected.  The target INR is 2 - 3.  The next INR is due 02/06/2010.  Anticoagulation instructions were given to patient.  Results were reviewed/authorized by Weston Brass, PharmD.  He was notified by Weston Brass PharmD.         Prior Anticoagulation Instructions: INR 2.0 Today 7.5mg s then resume 5mg s daily. Recheck in 4 weeks.  Current Anticoagulation Instructions: INR 2.8  Continue same dose of 1 tablet every day

## 2010-10-19 NOTE — Assessment & Plan Note (Signed)
Summary: 3 mos f/u #/cd   Vital Signs:  Patient profile:   77 year old male Weight:      163 pounds Temp:     96.5 degrees F oral Pulse rate:   65 / minute BP sitting:   144 / 80  (left arm)  Vitals Entered By: Tora Perches (October 06, 2009 10:33 AM) CC: f/u Is Patient Diabetic? No   Primary Care Provider:  Simeon Vera, MD  CC:  f/u.  History of Present Illness: The patient presents for a follow up of hypertension, Afib, abn CBC   Preventive Screening-Counseling & Management  Alcohol-Tobacco     Smoking Status: quit  Current Medications (verified): 1)  Levothroid 88 Mcg  Tabs (Levothyroxine Sodium) .... Once Daily 2)  Avapro 300 Mg  Tabs (Irbesartan) .... Once Daily 3)  Furosemide 20 Mg  Tabs (Furosemide) .Marland Kitchen.. 1 Once Daily 4)  Coreg 6.25 Mg  Tabs (Carvedilol) .... 3 Tabs Two Times A Day 5)  Coumadin 5 Mg Tabs (Warfarin Sodium) .... As Dirrected 6)  Depakene 250 Mg Caps (Valproic Acid) .... Once Daily 7)  Vitamin D3 1000 Unit  Tabs (Cholecalciferol) .... Once Daily 8)  Epipen 2-Pak 0.3 Mg/0.59ml (1:1000)  Devi (Epinephrine Hcl (Anaphylaxis)) .... As Dirrected 9)  Botox Cosmetic 50 Unit  Solr (Botulinum Toxin Type A (Cosm)) .... 5 Inject. Each Eye Every 4 Months 10)  Aspirin 81 Mg Tabs (Aspirin) .... Once Daily  Allergies: 1)  ! Welchol (Colesevelam Hcl) 2)  ! Zetia (Ezetimibe) 3)  ! * Clonidine 4)  ! Clonidine Hcl (Clonidine Hcl) 5)  ! Lipitor (Atorvastatin Calcium) 6)  ! Osteo Bi-Flex Adv Double St (Misc Natural Products) 7)  ! Lotensin 8)  ! Cardizem (Diltiazem Hcl) 9)  ! Amoxicillin (Amoxicillin) 10)  ! Amiodarone Hcl (Amiodarone Hcl)  Past History:  Past Medical History: Last updated: 02/09/2009 PACEMAKER, PERMANENT (ICD-V45.01) ATRIAL FIBRILLATION, PAROXYSMAL (ICD-427.31) AV BLOCK, COMPLETE (ICD-426.0) ANTICOAGULATION THERAPY (ICD-V58.61) ESSENTIAL HYPERTENSION, BENIGN (ICD-401.1) RUQ PAIN (ICD-789.01) ANXIETY (ICD-300.00) HYPOTHYROIDISM  (ICD-244.9) BEE STING (ICD-989.5) HA controlled on Depakote  Social History: Last updated: 02/09/2009 Retired Single Former Smoker Alcohol Use - no Drug Use - no  Physical Exam  General:  Well-developed,well-nourished,in no acute distress; alert,appropriate and cooperative throughout examination Nose:  WNL Mouth:  Oral mucosa and oropharynx without lesions or exudates.  Teeth in good repair. Neck:  No deformities, masses, or tenderness noted. Lungs:  CTA Heart:  Irreg Abdomen:  no HSM Msk:  No deformity or scoliosis noted of thoracic or lumbar spine.   Extremities:  WNL Skin:  L shin hematoma  Cervical Nodes:  No lymphadenopathy noted Inguinal Nodes:  No significant adenopathy   Impression & Recommendations:  Problem # 1:  CBC, ABNORMAL (ICD-790.99) Assessment Comment Only  Orders: TLB-TSH (Thyroid Stimulating Hormone) (84443-TSH) TLB-BMP (Basic Metabolic Panel-BMET) (80048-METABOL) TLB-B12, Serum-Total ONLY (16109-U04) TLB-CBC Platelet - w/Differential (85025-CBCD)  Problem # 2:  ATRIAL FIBRILLATION, PAROXYSMAL (ICD-427.31) Assessment: Unchanged  His updated medication list for this problem includes:    Coreg 6.25 Mg Tabs (Carvedilol) .Marland KitchenMarland KitchenMarland KitchenMarland Kitchen 3 tabs two times a day    Coumadin 5 Mg Tabs (Warfarin sodium) .Marland Kitchen... As dirrected    Aspirin 81 Mg Tabs (Aspirin) ..... Once daily  Orders: TLB-TSH (Thyroid Stimulating Hormone) (84443-TSH) TLB-BMP (Basic Metabolic Panel-BMET) (80048-METABOL) TLB-B12, Serum-Total ONLY (54098-J19) TLB-CBC Platelet - w/Differential (85025-CBCD)  Problem # 3:  HYPOTHYROIDISM (ICD-244.9) Assessment: Unchanged  His updated medication list for this problem includes:    Levothroid 88 Mcg Tabs (Levothyroxine  sodium) ..... Once daily  Orders: TLB-TSH (Thyroid Stimulating Hormone) (84443-TSH) TLB-BMP (Basic Metabolic Panel-BMET) (80048-METABOL) TLB-B12, Serum-Total ONLY (04540-J81) TLB-CBC Platelet - w/Differential (85025-CBCD)  Problem # 4:   LYMPHADENOPATHY (ICD-785.6) Assessment: Improved  Complete Medication List: 1)  Levothroid 88 Mcg Tabs (Levothyroxine sodium) .... Once daily 2)  Avapro 300 Mg Tabs (Irbesartan) .... Once daily 3)  Furosemide 20 Mg Tabs (Furosemide) .Marland Kitchen.. 1 once daily 4)  Coreg 6.25 Mg Tabs (Carvedilol) .... 3 tabs two times a day 5)  Coumadin 5 Mg Tabs (Warfarin sodium) .... As dirrected 6)  Depakene 250 Mg Caps (Valproic acid) .... Once daily 7)  Vitamin D3 1000 Unit Tabs (Cholecalciferol) .... Once daily 8)  Epipen 2-pak 0.3 Mg/0.43ml (1:1000) Devi (Epinephrine hcl (anaphylaxis)) .... As dirrected 9)  Botox Cosmetic 50 Unit Solr (Botulinum toxin type a (cosm)) .... 5 inject. each eye every 4 months 10)  Aspirin 81 Mg Tabs (Aspirin) .... Once daily  Patient Instructions: 1)  Please schedule a follow-up appointment in 3 months. 2)  BMP prior to visit, ICD-9: 3)  CBC w/ Diff prior to visit, ICD-9:995.20 Prescriptions: AVAPRO 300 MG  TABS (IRBESARTAN) once daily  #90 x 3   Entered and Authorized by:   Tresa Garter MD   Signed by:   Tresa Garter MD on 10/06/2009   Method used:   Print then Give to Patient   RxID:   [id number]

## 2010-10-19 NOTE — Progress Notes (Signed)
Summary: NEED TO STOP COUMADINN FRO 2 DAY TO HAVE TEETH PULLED   Phone Note From Other Clinic Call back at [phone]   Caller: DR.CARPENTER  Summary of Call: PT HAVING TEETH PULLED NEED TO STOP COUMADIN 2 DAY NEED A CALL BACK Initial call taken by: Judie Grieve,  Jan 31, 2010 12:57 PM  Follow-up for Phone Call        Spoke with Dr. Warren Danes.  He prefers pt's INR be close to 2.0 prior to procedure.  Would like to hold Coumadin for 1-2 days prior to procedure.  There is no date set yet.  He does not need [place] to check an INR prior to the procedure.  Will forward to Dr. Graciela Husbands for his okay.  Follow-up by: Weston Brass PharmD,  Jan 31, 2010 1:52 PM  Additional Follow-up for Phone Call Additional follow up Details #1::        fine Additional Follow-up by: Nathen May, MD, [facility],  Jan 31, 2010 5:05 PM

## 2010-10-19 NOTE — Medication Information (Signed)
Summary: rov/tm  Anticoagulant Therapy  Managed by: Cloyde Reams, RN, BSN Referring MD: Daisey Must PCP: Posey Rea, MD Supervising MD: Shirlee Latch MD, Ean Gettel Indication 1: Atrial Fibrillation (ICD-427.31) Lab Used: [facility] Site: [facility] INR POC 2.1 INR RANGE 2 - 3  Dietary changes: no    Health status changes: no    Bleeding/hemorrhagic complications: no    Recent/future hospitalizations: no    Any changes in medication regimen? no    Recent/future dental: yes     Details: Had dental procedure after last OV, off coumadin x 2 days.    Any missed doses?: no       Is patient compliant with meds? yes       Allergies: 1)  ! Welchol (Colesevelam Hcl) 2)  ! Zetia (Ezetimibe) 3)  ! * Clonidine 4)  ! Clonidine Hcl (Clonidine Hcl) 5)  ! Lipitor (Atorvastatin Calcium) 6)  ! Osteo Bi-Flex Adv Double St (Misc Natural Products) 7)  ! Lotensin 8)  ! Cardizem (Diltiazem Hcl) 9)  ! Amoxicillin (Amoxicillin) 10)  ! Amiodarone Hcl (Amiodarone Hcl)  Anticoagulation Management History:      The patient is taking warfarin and comes in today for a routine follow up visit.  Positive risk factors for bleeding include an age of 75 years or older.  The bleeding index is 'intermediate risk'.  Positive CHADS2 values include History of HTN and Age > 73 years old.  The start date was 06/11/2003.  His last INR was 2.7.  Anticoagulation responsible provider: Shirlee Latch MD, Cleo Villamizar.  INR POC: 2.1.  Cuvette Lot#: [id number].  Exp: 05/2011.    Anticoagulation Management Assessment/Plan:      The patient's current anticoagulation dose is Coumadin 5 mg tabs: as dirrected.  The target INR is 2 - 3.  The next INR is due 04/03/2010.  Anticoagulation instructions were given to patient.  Results were reviewed/authorized by Cloyde Reams, RN, BSN.  He was notified by Cloyde Reams RN.         Prior Anticoagulation Instructions: INR 2.2 Continue 5mg s daily. When you resume take 7.5mg s for 2days. Recheck in 4  weeks.   Current Anticoagulation Instructions: INR 2.1  Continue on same dosage 5mg  daily.  Recheck in 4 weeks.

## 2010-10-19 NOTE — Assessment & Plan Note (Signed)
Summary: 6 MO ROV /NWS #   Vital Signs:  Patient profile:   77 year old male Height:      70 inches Weight:      162 pounds BMI:     23.33 Temp:     96.7 degrees F oral Pulse rate:   72 / minute Pulse rhythm:   regular Resp:     16 per minute BP sitting:   114 / 80  (left arm) Cuff size:   regular  Vitals Entered By: Lanier Prude, CMA(AAMA) (July 14, 2010 10:58 AM) CC: 6 mo f/u Is Patient Diabetic? No   Primary Care Laurette Villescas:  Plotnikov, MD  CC:  6 mo f/u.  History of Present Illness: The patient presents for a follow up of A fib, hypertension, diabetes, hypothyroidism. He had a carot [place] a few years back   Preventive Screening-Counseling & Management  Caffeine-Diet-Exercise     Does Patient Exercise: yes  Current Medications (verified): 1)  Levothroid 88 Mcg  Tabs (Levothyroxine Sodium) .... Once Daily 2)  Avapro 300 Mg  Tabs (Irbesartan) .... Once Daily 3)  Furosemide 20 Mg  Tabs (Furosemide) .Marland Kitchen.. 1 Once Daily 4)  Coreg 6.25 Mg  Tabs (Carvedilol) .... 3 Tabs Two Times A Day 5)  Coumadin 5 Mg Tabs (Warfarin Sodium) .... As Dirrected 6)  Vitamin D3 1000 Unit  Tabs (Cholecalciferol) .... Once Daily 7)  Epipen 2-Pak 0.3 Mg/0.71ml (1:1000)  Devi (Epinephrine Hcl (Anaphylaxis)) .... As Dirrected 8)  Botox Cosmetic 50 Unit  Solr (Botulinum Toxin Type A (Cosm)) .... 2.5 Inject. Each Eye Every 4 Months  Allergies (verified): 1)  ! Welchol (Colesevelam Hcl) 2)  ! Zetia (Ezetimibe) 3)  ! * Clonidine 4)  ! Clonidine Hcl (Clonidine Hcl) 5)  ! Lipitor (Atorvastatin Calcium) 6)  ! Osteo Bi-Flex Adv Double St (Misc Natural Products) 7)  ! Lotensin 8)  ! Cardizem (Diltiazem Hcl) 9)  ! Amoxicillin (Amoxicillin) 10)  ! Amiodarone Hcl (Amiodarone Hcl)  Past History:  Past Medical History: Last updated: 02/09/2009 PACEMAKER, PERMANENT (ICD-V45.01) ATRIAL FIBRILLATION, PAROXYSMAL (ICD-427.31) AV BLOCK, COMPLETE (ICD-426.0) ANTICOAGULATION THERAPY (ICD-V58.61) ESSENTIAL  HYPERTENSION, BENIGN (ICD-401.1) RUQ PAIN (ICD-789.01) ANXIETY (ICD-300.00) HYPOTHYROIDISM (ICD-244.9) BEE STING (ICD-989.5) HA controlled on Depakote  Social History: Retired Single Former Smoker Alcohol Use - no Drug Use - no Regular exercise-yes Does Patient Exercise:  yes  Review of Systems  The patient denies fever, syncope, abdominal pain, melena, and dyspnea on exertion.    Physical Exam  General:  Well-developed,well-nourished,in no acute distress; alert,appropriate and cooperative throughout examination Nose:  WNL Mouth:  Oral mucosa and oropharynx without lesions or exudates.  Teeth in good repair. Neck:  No deformities, masses, or tenderness noted. Lungs:  CTA Heart:  Irreg Abdomen:  no HSM Msk:  No deformity or scoliosis noted of thoracic or lumbar spine.   Neurologic:  alert & oriented X3 and cranial nerves II-XII intact.     Impression & Recommendations:  Problem # 1:  CBC, ABNORMAL (ICD-790.99) Assessment Improved Will watch  Problem # 2:  ATRIAL FIBRILLATION PERMANENT (ICD-427.31) Assessment: Improved  His updated medication list for this problem includes:    Coreg 6.25 Mg Tabs (Carvedilol) .Marland KitchenMarland KitchenMarland KitchenMarland Kitchen 3 tabs two times a day    Coumadin 5 Mg Tabs (Warfarin sodium) .Marland Kitchen... As dirrected  Problem # 3:  ANXIETY (ICD-300.00) Assessment: Improved  Problem # 4:  HYPOTHYROIDISM (ICD-244.9) Assessment: Unchanged  His updated medication list for this problem includes:    Levothroid 88 Mcg Tabs (  Levothyroxine sodium) ..... Once daily  Problem # 5:  LYMPHADENOPATHY (ICD-785.6) R neck Assessment: Unchanged s/p ENT eval and [place]: he was told it was carotid bulb  Complete Medication List: 1)  Levothroid 88 Mcg Tabs (Levothyroxine sodium) .... Once daily 2)  Avapro 300 Mg Tabs (Irbesartan) .... Once daily 3)  Furosemide 20 Mg Tabs (Furosemide) .Marland Kitchen.. 1 once daily 4)  Coreg 6.25 Mg Tabs (Carvedilol) .... 3 tabs two times a day 5)  Coumadin 5 Mg Tabs (Warfarin  sodium) .... As dirrected 6)  Vitamin D3 1000 Unit Tabs (Cholecalciferol) .... Once daily 7)  Epipen 2-pak 0.3 Mg/0.34ml (1:1000) Devi (Epinephrine hcl (anaphylaxis)) .... As dirrected 8)  Botox Cosmetic 50 Unit Solr (Botulinum toxin type a (cosm)) .... 2.5 inject. each eye every 4 months  Patient Instructions: 1)  Please schedule a follow-up appointment in 6 months well w/labs.   Orders Added: 1)  Est. Patient Level IV [16109]   Immunization History:  Influenza Immunization History:    Influenza:  historical (05/31/2010)   Immunization History:  Influenza Immunization History:    Influenza:  Historical (05/31/2010)

## 2010-10-19 NOTE — Medication Information (Signed)
Summary: rov/cs   Anticoagulant Therapy  Managed by: Lyna Poser, PharmD Referring MD: Graciela Husbands MD, Viviann Spare PCP: Posey Rea, MD Supervising MD: Johney Frame MD, Fayrene Fearing Indication 1: Atrial Fibrillation (ICD-427.31) Lab Used: [facility] Site: [facility] INR POC 2.6 INR RANGE 2 - 3  Dietary changes: no    Health status changes: no    Bleeding/hemorrhagic complications: no    Recent/future hospitalizations: no    Any changes in medication regimen? no    Recent/future dental: no  Any missed doses?: no       Is patient compliant with meds? yes       Allergies: 1)  ! Welchol (Colesevelam Hcl) 2)  ! Zetia (Ezetimibe) 3)  ! * Clonidine 4)  ! Clonidine Hcl (Clonidine Hcl) 5)  ! Lipitor (Atorvastatin Calcium) 6)  ! Osteo Bi-Flex Adv Double St (Misc Natural Products) 7)  ! Lotensin 8)  ! Cardizem (Diltiazem Hcl) 9)  ! Amoxicillin (Amoxicillin) 10)  ! Amiodarone Hcl (Amiodarone Hcl)  Anticoagulation Management History:      The patient is taking warfarin and comes in today for a routine follow up visit.  Positive risk factors for bleeding include an age of 75 years or older.  The bleeding index is 'intermediate risk'.  Positive CHADS2 values include History of HTN and Age > 38 years old.  The start date was 06/11/2003.  His last INR was 2.7.  Anticoagulation responsible provider: Davinia Riccardi MD, Fayrene Fearing.  INR POC: 2.6.  Cuvette Lot#: [id number].  Exp: 07/2011.    Anticoagulation Management Assessment/Plan:      The patient's current anticoagulation dose is Coumadin 5 mg tabs: as dirrected.  The target INR is 2 - 3.  The next INR is due 08/22/2010.  Anticoagulation instructions were given to patient.  Results were reviewed/authorized by Lyna Poser, PharmD.         Prior Anticoagulation Instructions: INR 2.3  Continue Coumadin as scheduled:  1 tablet every day of the week.   Return to clinic in 4 weeks.    Current Anticoagulation Instructions: INR 2.6 Continue taking 1 tablet everyday.  Recheck in 4 weeks.

## 2010-10-19 NOTE — Medication Information (Signed)
Summary: rov/sp  Anticoagulant Therapy  Managed by: Cloyde Reams, RN, BSN Referring MD: Graciela Husbands MD, Viviann Spare PCP: Posey Rea, MD Supervising MD: Gala Romney MD, Reuel Boom Indication 1: Atrial Fibrillation (ICD-427.31) Lab Used: [facility] Site: [facility] INR POC 2.4 INR RANGE 2 - 3  Dietary changes: no    Health status changes: no    Bleeding/hemorrhagic complications: no    Recent/future hospitalizations: no    Any changes in medication regimen? no    Recent/future dental: no  Any missed doses?: no       Is patient compliant with meds? yes       Allergies: 1)  ! Welchol (Colesevelam Hcl) 2)  ! Zetia (Ezetimibe) 3)  ! * Clonidine 4)  ! Clonidine Hcl (Clonidine Hcl) 5)  ! Lipitor (Atorvastatin Calcium) 6)  ! Osteo Bi-Flex Adv Double St (Misc Natural Products) 7)  ! Lotensin 8)  ! Cardizem (Diltiazem Hcl) 9)  ! Amoxicillin (Amoxicillin) 10)  ! Amiodarone Hcl (Amiodarone Hcl)  Anticoagulation Management History:      The patient is taking warfarin and comes in today for a routine follow up visit.  Positive risk factors for bleeding include an age of 75 years or older.  The bleeding index is 'intermediate risk'.  Positive CHADS2 values include History of HTN and Age > 29 years old.  The start date was 06/11/2003.  His last INR was 2.7.  Anticoagulation responsible provider: Bensimhon MD, Reuel Boom.  INR POC: 2.4.  Cuvette Lot#: [id number].  Exp: 10/2011.    Anticoagulation Management Assessment/Plan:      The patient's current anticoagulation dose is Coumadin 5 mg tabs: as dirrected.  The target INR is 2 - 3.  The next INR is due 10/24/2010.  Anticoagulation instructions were given to patient.  Results were reviewed/authorized by Cloyde Reams, RN, BSN.  He was notified by Cloyde Reams RN.         Prior Anticoagulation Instructions: INR 2.9  Continue same dose of 1 tablet every day.  Recheck INR in 4 weeks.  Current Anticoagulation Instructions: INR 2.4  Continue on same  dosage 5mg  daily.  Recheck in 4 weeks.

## 2010-10-19 NOTE — Medication Information (Signed)
Summary: rov/tm  Anticoagulant Therapy  Managed by: Rolland Porter, PharmD Referring MD: Graciela Husbands MD, Viviann Spare PCP: Posey Rea, MD Supervising MD: Tenny Craw MD, Gunnar Fusi Indication 1: Atrial Fibrillation (ICD-427.31) Lab Used: [facility] Site: [facility] INR POC 2.7 INR RANGE 2 - 3  Dietary changes: no    Health status changes: no    Bleeding/hemorrhagic complications: no    Recent/future hospitalizations: no    Any changes in medication regimen? no    Recent/future dental: no  Any missed doses?: no       Is patient compliant with meds? yes       Allergies: 1)  ! Welchol (Colesevelam Hcl) 2)  ! Zetia (Ezetimibe) 3)  ! * Clonidine 4)  ! Clonidine Hcl (Clonidine Hcl) 5)  ! Lipitor (Atorvastatin Calcium) 6)  ! Osteo Bi-Flex Adv Double St (Misc Natural Products) 7)  ! Lotensin 8)  ! Cardizem (Diltiazem Hcl) 9)  ! Amoxicillin (Amoxicillin) 10)  ! Amiodarone Hcl (Amiodarone Hcl)  Anticoagulation Management History:      Positive risk factors for bleeding include an age of 17 years or older.  The bleeding index is 'intermediate risk'.  Positive CHADS2 values include History of HTN and Age > 45 years old.  The start date was 06/11/2003.  His last INR was 2.7.  Anticoagulation responsible provider: Tenny Craw MD, Gunnar Fusi.  INR POC: 2.7.  Cuvette Lot#: [id number].  Exp: 06/2011.    Anticoagulation Management Assessment/Plan:      The patient's current anticoagulation dose is Coumadin 5 mg tabs: as dirrected.  The target INR is 2 - 3.  The next INR is due 06/26/2010.  Anticoagulation instructions were given to patient.  Results were reviewed/authorized by Rolland Porter, PharmD.  He was notified by Kennieth Francois.         Prior Anticoagulation Instructions: INR 2.8  Continue 5mg s everyday. Recheck in 4 weeks.   Current Anticoagulation Instructions: INR 2.7  Continue taking one tablet every day. We will see you in four weeks.

## 2010-10-19 NOTE — Medication Information (Signed)
Summary: rov/mlw  Anticoagulant Therapy  Managed by: Cloyde Reams, RN, BSN Referring MD: Daisey Must PCP: Posey Rea, MD Supervising MD: Antoine Poche MD, Fayrene Fearing Indication 1: Atrial Fibrillation (ICD-427.31) Lab Used: [facility] Site: [facility] INR POC 2.1 INR RANGE 2 - 3  Dietary changes: no    Health status changes: no    Bleeding/hemorrhagic complications: no    Recent/future hospitalizations: no    Any changes in medication regimen? no    Recent/future dental: no  Any missed doses?: no       Is patient compliant with meds? yes       Allergies (verified): 1)  ! Welchol (Colesevelam Hcl) 2)  ! Zetia (Ezetimibe) 3)  ! * Clonidine 4)  ! Clonidine Hcl (Clonidine Hcl) 5)  ! Lipitor (Atorvastatin Calcium) 6)  ! Osteo Bi-Flex Adv Double St (Misc Natural Products) 7)  ! Lotensin 8)  ! Cardizem (Diltiazem Hcl) 9)  ! Amoxicillin (Amoxicillin) 10)  ! Amiodarone Hcl (Amiodarone Hcl)  Anticoagulation Management History:      The patient is taking warfarin and comes in today for a routine follow up visit.  Positive risk factors for bleeding include an age of 75 years or older.  The bleeding index is 'intermediate risk'.  Positive CHADS2 values include History of HTN and Age > 75 years old.  The start date was 06/11/2003.  His last INR was 2.7.  Anticoagulation responsible provider: Antoine Poche MD, Fayrene Fearing.  INR POC: 2.1.  Cuvette Lot#: [id number].  Exp: 10/2010.    Anticoagulation Management Assessment/Plan:      The patient's current anticoagulation dose is Coumadin 5 mg tabs: as dirrected.  The target INR is 2 - 3.  The next INR is due 10/21/2009.  Anticoagulation instructions were given to patient.  Results were reviewed/authorized by Cloyde Reams, RN, BSN.  He was notified by Cloyde Reams RN.         Prior Anticoagulation Instructions: INR 3  Take 1 tab daily (5 mg).   Recheck in 3 weeks.  Current Anticoagulation Instructions: INR 2.1  Continue on same dosage 1 tablet  (5mg ) daily.  Recheck in 4 weeks.

## 2010-10-19 NOTE — Medication Information (Signed)
Summary: [name]  Anticoagulant Therapy  Managed by: Bethena Midget, RN, BSN Referring MD: Daisey Must PCP: Posey Rea, MD Supervising MD: Daleen Squibb MD, Maisie Fus Indication 1: Atrial Fibrillation (ICD-427.31) Lab Used: [facility] Site: [facility] INR POC 2.0 INR RANGE 2 - 3  Dietary changes: no    Health status changes: no    Bleeding/hemorrhagic complications: no    Recent/future hospitalizations: no    Any changes in medication regimen? no    Recent/future dental: no  Any missed doses?: no       Is patient compliant with meds? yes       Allergies: 1)  ! Welchol (Colesevelam Hcl) 2)  ! Zetia (Ezetimibe) 3)  ! * Clonidine 4)  ! Clonidine Hcl (Clonidine Hcl) 5)  ! Lipitor (Atorvastatin Calcium) 6)  ! Osteo Bi-Flex Adv Double St (Misc Natural Products) 7)  ! Lotensin 8)  ! Cardizem (Diltiazem Hcl) 9)  ! Amoxicillin (Amoxicillin) 10)  ! Amiodarone Hcl (Amiodarone Hcl)  Anticoagulation Management History:      The patient is taking warfarin and comes in today for a routine follow up visit.  Positive risk factors for bleeding include an age of 75 years or older.  The bleeding index is 'intermediate risk'.  Positive CHADS2 values include History of HTN and Age > 6 years old.  The start date was 06/11/2003.  His last INR was 2.7.  Anticoagulation responsible provider: Daleen Squibb MD, Maisie Fus.  INR POC: 2.0.  Cuvette Lot#: [id number].  Exp: 01/2011.    Anticoagulation Management Assessment/Plan:      The patient's current anticoagulation dose is Coumadin 5 mg tabs: as dirrected.  The target INR is 2 - 3.  The next INR is due 01/06/2010.  Anticoagulation instructions were given to patient.  Results were reviewed/authorized by Bethena Midget, RN, BSN.  He was notified by Bethena Midget, RN, BSN.         Prior Anticoagulation Instructions: The patient is to continue with the same dose of coumadin.  This dosage includes: 5mg  daily.  Current Anticoagulation Instructions: INR 2.0 Today 7.5mg s  then resume 5mg s daily. Recheck in 4 weeks.

## 2010-10-19 NOTE — Medication Information (Signed)
Summary: rov/ln  Anticoagulant Therapy  Managed by: Bethena Midget, RN, BSN Referring MD: Graciela Husbands MD, Viviann Spare PCP: Posey Rea, MD Supervising MD: Shirlee Latch MD, Dalton Indication 1: Atrial Fibrillation (ICD-427.31) Lab Used: [facility] Site: [facility] INR POC 2.8 INR RANGE 2 - 3  Dietary changes: no    Health status changes: no    Bleeding/hemorrhagic complications: no    Recent/future hospitalizations: no    Any changes in medication regimen? no    Recent/future dental: no  Any missed doses?: no       Is patient compliant with meds? yes       Allergies: 1)  ! Welchol (Colesevelam Hcl) 2)  ! Zetia (Ezetimibe) 3)  ! * Clonidine 4)  ! Clonidine Hcl (Clonidine Hcl) 5)  ! Lipitor (Atorvastatin Calcium) 6)  ! Osteo Bi-Flex Adv Double St (Misc Natural Products) 7)  ! Lotensin 8)  ! Cardizem (Diltiazem Hcl) 9)  ! Amoxicillin (Amoxicillin) 10)  ! Amiodarone Hcl (Amiodarone Hcl)  Anticoagulation Management History:      The patient is taking warfarin and comes in today for a routine follow up visit.  Positive risk factors for bleeding include an age of 75 years or older.  The bleeding index is 'intermediate risk'.  Positive CHADS2 values include History of HTN and Age > 75 years old.  The start date was 06/11/2003.  His last INR was 2.7.  Anticoagulation responsible provider: Shirlee Latch MD, Dalton.  INR POC: 2.8.  Cuvette Lot#: [id number].  Exp: 06/2011.    Anticoagulation Management Assessment/Plan:      The patient's current anticoagulation dose is Coumadin 5 mg tabs: as dirrected.  The target INR is 2 - 3.  The next INR is due 05/29/2010.  Anticoagulation instructions were given to patient.  Results were reviewed/authorized by Bethena Midget, RN, BSN.  He was notified by Bethena Midget, RN, BSN.         Prior Anticoagulation Instructions: INR 2.2  Continue same dose of 1 tab daily.  Re-check in 4 weeks.  Current Anticoagulation Instructions: INR 2.8  Continue 5mg s everyday. Recheck  in 4 weeks.

## 2010-10-19 NOTE — Medication Information (Signed)
Summary: rov/tm  Anticoagulant Therapy  Managed by: Charolotte Eke, PharmD Referring MD: Daisey Must PCP: Posey Rea, MD Supervising MD: Antoine Poche MD, Fayrene Fearing Indication 1: Atrial Fibrillation (ICD-427.31) Lab Used: [facility] Site: [facility] INR POC 2.1 INR RANGE 2 - 3  Dietary changes: no    Health status changes: no    Bleeding/hemorrhagic complications: no    Recent/future hospitalizations: no    Any changes in medication regimen? no    Recent/future dental: no  Any missed doses?: no       Is patient compliant with meds? yes       Current Medications (verified): 1)  Levothroid 88 Mcg  Tabs (Levothyroxine Sodium) .... Once Daily 2)  Avapro 300 Mg  Tabs (Irbesartan) .... Once Daily 3)  Furosemide 20 Mg  Tabs (Furosemide) .Marland Kitchen.. 1 Once Daily 4)  Coreg 6.25 Mg  Tabs (Carvedilol) .... 3 Tabs Two Times A Day 5)  Coumadin 5 Mg Tabs (Warfarin Sodium) .... As Dirrected 6)  Vitamin D3 1000 Unit  Tabs (Cholecalciferol) .... Once Daily 7)  Epipen 2-Pak 0.3 Mg/0.48ml (1:1000)  Devi (Epinephrine Hcl (Anaphylaxis)) .... As Dirrected 8)  Botox Cosmetic 50 Unit  Solr (Botulinum Toxin Type A (Cosm)) .... 5 Inject. Each Eye Every 4 Months  Allergies (verified): 1)  ! Welchol (Colesevelam Hcl) 2)  ! Zetia (Ezetimibe) 3)  ! * Clonidine 4)  ! Clonidine Hcl (Clonidine Hcl) 5)  ! Lipitor (Atorvastatin Calcium) 6)  ! Osteo Bi-Flex Adv Double St (Misc Natural Products) 7)  ! Lotensin 8)  ! Cardizem (Diltiazem Hcl) 9)  ! Amoxicillin (Amoxicillin) 10)  ! Amiodarone Hcl (Amiodarone Hcl)  Anticoagulation Management History:      Positive risk factors for bleeding include an age of 35 years or older.  The bleeding index is 'intermediate risk'.  Positive CHADS2 values include History of HTN and Age > 60 years old.  The start date was 06/11/2003.  His last INR was 2.7.  Anticoagulation responsible provider: Antoine Poche MD, Fayrene Fearing.  INR POC: 2.1.  Exp: 12/2010.    Anticoagulation Management  Assessment/Plan:      The patient's current anticoagulation dose is Coumadin 5 mg tabs: as dirrected.  The target INR is 2 - 3.  The next INR is due 12/08/2009.  Anticoagulation instructions were given to patient.  Results were reviewed/authorized by Charolotte Eke, PharmD.  He was notified by Charolotte Eke, PharmD.         Prior Anticoagulation Instructions: INR 2.6  Continue taking 5mg  daily. Recheck in 4 weeks.  Current Anticoagulation Instructions: The patient is to continue with the same dose of coumadin.  This dosage includes: 5mg  daily.

## 2010-10-19 NOTE — Assessment & Plan Note (Signed)
Summary: pacer check/[facility]      Allergies Added:   Primary Provider:  Plotnikov, MD   History of Present Illness:   Mr. Dickison is seen in followup for atrial fibrillation and a permanent. He is status post AV junction ablation following 2 failed PPIs. He is status post pacemaker implantation and his device dependent.  The patient denies SOB, chest pain, edema or palpitations   Current Medications (verified): 1)  Levothroid 88 Mcg  Tabs (Levothyroxine Sodium) .... Once Daily 2)  Avapro 300 Mg  Tabs (Irbesartan) .... Once Daily 3)  Furosemide 20 Mg  Tabs (Furosemide) .Marland Kitchen.. 1 Once Daily 4)  Coreg 6.25 Mg  Tabs (Carvedilol) .... 3 Tabs Two Times A Day 5)  Coumadin 5 Mg Tabs (Warfarin Sodium) .... As Dirrected 6)  Vitamin D3 1000 Unit  Tabs (Cholecalciferol) .... Once Daily 7)  Epipen 2-Pak 0.3 Mg/0.44ml (1:1000)  Devi (Epinephrine Hcl (Anaphylaxis)) .... As Dirrected 8)  Botox Cosmetic 50 Unit  Solr (Botulinum Toxin Type A (Cosm)) .... 5 Inject. Each Eye Every 4 Months 9)  Aspirin 81 Mg Tabs (Aspirin) .... Once Daily  Allergies (verified): 1)  ! Welchol (Colesevelam Hcl) 2)  ! Zetia (Ezetimibe) 3)  ! * Clonidine 4)  ! Clonidine Hcl (Clonidine Hcl) 5)  ! Lipitor (Atorvastatin Calcium) 6)  ! Osteo Bi-Flex Adv Double St (Misc Natural Products) 7)  ! Lotensin 8)  ! Cardizem (Diltiazem Hcl) 9)  ! Amoxicillin (Amoxicillin) 10)  ! Amiodarone Hcl (Amiodarone Hcl)  Past History:  Past Medical History: Last updated: 02/09/2009 PACEMAKER, PERMANENT (ICD-V45.01) ATRIAL FIBRILLATION, PAROXYSMAL (ICD-427.31) AV BLOCK, COMPLETE (ICD-426.0) ANTICOAGULATION THERAPY (ICD-V58.61) ESSENTIAL HYPERTENSION, BENIGN (ICD-401.1) RUQ PAIN (ICD-789.01) ANXIETY (ICD-300.00) HYPOTHYROIDISM (ICD-244.9) BEE STING (ICD-989.5) HA controlled on Depakote  Past Surgical History: Last updated: 02/09/2009 [facility] - PCM - 10/16/05  Family History: Last updated: 09/04/2007 Family History  Hypertension  Social History: Last updated: 02/09/2009 Retired Single Former Smoker Alcohol Use - no Drug Use - no  Vital Signs:  Patient profile:   77 year old male Height:      70 inches Weight:      163 pounds Pulse rate:   65 / minute Pulse rhythm:   regular BP sitting:   141 / 73  (left arm) Cuff size:   large  Vitals Entered By: Oswald Hillock (October 25, 2009 10:56 AM)  Physical Exam  General:  The patient was alert and oriented in no acute distress. HEENT Normal.  Neck veins were flat, carotids were brisk.  Lungs were clear.  Heart sounds were regular without murmurs or gallops.  Abdomen was soft with active bowel sounds. There is no clubbing cyanosis or edema. Skin Warm and dry    PPM Specifications Following MD:  Sherryl Manges, MD     PPM Vendor:  [employer]     PPM Model Number:  [id number]     PPM Serial Number:  [id number] PPM DOI:  10/16/2005     PPM Implanting MD:  Sherryl Manges, MD  Lead 1    Location: RA     DOI: 05/10/1997     Model #: [id number]     Serial #: [id number]     Status: active Lead 2    Location: RV     DOI: 05/10/1997     Model #: [id number]     Serial #: [id number]     Status: active   Indications:  SND   PPM Follow Up Remote Check?  No Battery Voltage:  2.78 V     Battery Est. Longevity:  7 years     Pacer Dependent:  Yes       PPM Device Measurements Atrium  Amplitude: 2.9 mV, Impedance: 369 ohms, Threshold: 0.7 V at 0.4 msec Right Ventricle  Impedance: 1156 ohms, Threshold: 1.25 V at 0.8 msec  Episodes MS Episodes:  105     Percent Mode Switch:  21%     Coumadin:  Yes Atrial Pacing:  76%     Ventricular Pacing:  99%  Parameters Mode:  DDIR     Lower Rate Limit:  65     Upper Rate Limit:  125 Paced AV Delay:  170     Next Cardiology Appt Due:  04/17/2010 Tech Comments:  No parameter changes.  Device function normal.  Longest A-fib 37 days. Checked by [employer].  ROV 6 months clinic. Altha Harm, LPN  October 25, 2009 11:08 AM   Impression &  Recommendations:  Problem # 1:  ATRIAL FIBRILLATION, PAROXYSMAL (ICD-427.31) the patient has permanent atrial fibrillation. We will plan to continue him on his Coumadin. However, we will discontinue his aspirin. It only adds risk  Problem # 2:  AV BLOCK, COMPLETE S/P AV ABLATION (ICD-426.0) stable status post AV junction ablation with pacemaker implantation The following medications were removed from the medication list:    Aspirin 81 Mg Tabs (Aspirin) ..... Once daily His updated medication list for this problem includes:    Coreg 6.25 Mg Tabs (Carvedilol) .Marland KitchenMarland KitchenMarland KitchenMarland Kitchen 3 tabs two times a day    Coumadin 5 Mg Tabs (Warfarin sodium) .Marland Kitchen... As dirrected  Problem # 3:  PACEMAKER, ST J (ICD-V45.01) Device parameters and data were reviewed and no changes were made  Patient Instructions: 1)  Your physician has recommended you make the following change in your medication: STOP ASPIRIN 2)  Your physician recommends that you schedule a follow-up appointment in: 6 MONTHS WITH DEVICE CLINIC

## 2010-10-19 NOTE — Medication Information (Signed)
Summary: rov/mw   Anticoagulant Therapy  Managed by: Weston Brass, PharmD Referring MD: Graciela Husbands MD, Viviann Spare PCP: Posey Rea, MD Supervising MD: Eden Emms MD, Theron Arista Indication 1: Atrial Fibrillation (ICD-427.31) Lab Used: [facility] Site: [name] INR POC 2.9 INR RANGE 2 - 3  Dietary changes: no    Health status changes: no    Bleeding/hemorrhagic complications: no    Recent/future hospitalizations: no    Any changes in medication regimen? no    Recent/future dental: no  Any missed doses?: no       Is patient compliant with meds? yes       Allergies: 1)  ! Welchol (Colesevelam Hcl) 2)  ! Zetia (Ezetimibe) 3)  ! * Clonidine 4)  ! Clonidine Hcl (Clonidine Hcl) 5)  ! Lipitor (Atorvastatin Calcium) 6)  ! Osteo Bi-Flex Adv Double St (Misc Natural Products) 7)  ! Lotensin 8)  ! Cardizem (Diltiazem Hcl) 9)  ! Amoxicillin (Amoxicillin) 10)  ! Amiodarone Hcl (Amiodarone Hcl)  Anticoagulation Management History:      The patient is taking warfarin and comes in today for a routine follow up visit.  Positive risk factors for bleeding include an age of 75 years or older.  The bleeding index is 'intermediate risk'.  Positive CHADS2 values include History of HTN and Age > 56 years old.  The start date was 06/11/2003.  His last INR was 2.7.  Anticoagulation responsible provider: Eden Emms MD, Theron Arista.  INR POC: 2.9.  Cuvette Lot#: [id number].  Exp: 06/2011.    Anticoagulation Management Assessment/Plan:      The patient's current anticoagulation dose is Coumadin 5 mg tabs: as dirrected.  The target INR is 2 - 3.  The next INR is due 09/26/2010.  Anticoagulation instructions were given to patient.  Results were reviewed/authorized by Weston Brass, PharmD.  He was notified by Weston Brass PharmD.         Prior Anticoagulation Instructions: INR 2.6 Continue taking 1 tablet everyday. Recheck in 4 weeks.   Current Anticoagulation Instructions: INR 2.9  Continue same dose of 1 tablet every day.   Recheck INR in 4 weeks.

## 2010-10-19 NOTE — Cardiovascular Report (Signed)
Summary: Office Visit   Office Visit   Imported By: Roderic Ovens 11/04/2009 13:08:21  _____________________________________________________________________  External Attachment:    Type:   Image     Comment:   External Document

## 2010-10-19 NOTE — Assessment & Plan Note (Signed)
Summary: 3 MO ROV /NWS  #   Vital Signs:  Patient profile:   77 year old male Height:      70 inches (177.80 cm) Weight:      160.75 pounds (73.07 kg) O2 Sat:      98 % on Room air Temp:     98.0 degrees F (36.67 degrees C) oral Pulse rate:   65 / minute Pulse rhythm:   regular BP sitting:   112 / 74  (left arm) Cuff size:   regular  Vitals Entered By: Brenton Grills (January 10, 2010 10:58 AM)  O2 Flow:  Room air CC: 3 mo ROV/aj   CC:  3 mo ROV/aj.  Current Medications (verified): 1)  Levothroid 88 Mcg  Tabs (Levothyroxine Sodium) .... Once Daily 2)  Avapro 300 Mg  Tabs (Irbesartan) .... Once Daily 3)  Furosemide 20 Mg  Tabs (Furosemide) .Marland Kitchen.. 1 Once Daily 4)  Coreg 6.25 Mg  Tabs (Carvedilol) .... 3 Tabs Two Times A Day 5)  Coumadin 5 Mg Tabs (Warfarin Sodium) .... As Dirrected 6)  Vitamin D3 1000 Unit  Tabs (Cholecalciferol) .... Once Daily 7)  Epipen 2-Pak 0.3 Mg/0.56ml (1:1000)  Devi (Epinephrine Hcl (Anaphylaxis)) .... As Dirrected 8)  Botox Cosmetic 50 Unit  Solr (Botulinum Toxin Type A (Cosm)) .... 5 Inject. Each Eye Every 4 Months  Allergies (verified): 1)  ! Welchol (Colesevelam Hcl) 2)  ! Zetia (Ezetimibe) 3)  ! * Clonidine 4)  ! Clonidine Hcl (Clonidine Hcl) 5)  ! Lipitor (Atorvastatin Calcium) 6)  ! Osteo Bi-Flex Adv Double St (Misc Natural Products) 7)  ! Lotensin 8)  ! Cardizem (Diltiazem Hcl) 9)  ! Amoxicillin (Amoxicillin) 10)  ! Amiodarone Hcl (Amiodarone Hcl)  Past History:  Past Medical History: Last updated: 02/09/2009 PACEMAKER, PERMANENT (ICD-V45.01) ATRIAL FIBRILLATION, PAROXYSMAL (ICD-427.31) AV BLOCK, COMPLETE (ICD-426.0) ANTICOAGULATION THERAPY (ICD-V58.61) ESSENTIAL HYPERTENSION, BENIGN (ICD-401.1) RUQ PAIN (ICD-789.01) ANXIETY (ICD-300.00) HYPOTHYROIDISM (ICD-244.9) BEE STING (ICD-989.5) HA controlled on Depakote  Social History: Last updated: 02/09/2009 Retired Single Former Smoker Alcohol Use - no Drug Use - no  Review of  Systems  The patient denies fever, weight loss, weight gain, and dyspnea on exertion.    Physical Exam  General:  Well-developed,well-nourished,in no acute distress; alert,appropriate and cooperative throughout examination Nose:  WNL Mouth:  Oral mucosa and oropharynx without lesions or exudates.  Teeth in good repair. Lungs:  CTA Heart:  Irreg Abdomen:  no HSM Msk:  No deformity or scoliosis noted of thoracic or lumbar spine.   Neurologic:  alert & oriented X3 and cranial nerves II-XII intact.   Skin:  L shin hematoma    Impression & Recommendations:  Problem # 1:  CBC, ABNORMAL (ICD-790.99) Assessment Improved The labs were reviewed with the patient.   Problem # 2:  ESSENTIAL HYPERTENSION, BENIGN (ICD-401.1) Assessment: Improved  His updated medication list for this problem includes:    Avapro 300 Mg Tabs (Irbesartan) ..... Once daily    Furosemide 20 Mg Tabs (Furosemide) .Marland Kitchen... 1 once daily    Coreg 6.25 Mg Tabs (Carvedilol) .Marland KitchenMarland KitchenMarland KitchenMarland Kitchen 3 tabs two times a day  BP today: 112/74 Prior BP: 141/73 (10/25/2009)  Labs Reviewed: K+: 4.9 (01/09/2010) Creat: : 0.8 (01/09/2010)   Chol: 155 (04/19/2008)   HDL: 47.7 (04/19/2008)   LDL: 96 (04/19/2008)   TG: 55 (04/19/2008)  Problem # 3:  ATRIAL FIBRILLATION PERMANENT (ICD-427.31) Assessment: Unchanged  His updated medication list for this problem includes:    Coreg 6.25 Mg Tabs (Carvedilol) .Marland KitchenMarland KitchenMarland KitchenMarland Kitchen  3 tabs two times a day    Coumadin 5 Mg Tabs (Warfarin sodium) .Marland Kitchen... As dirrected  Problem # 4:  HYPOTHYROIDISM (ICD-244.9) Assessment: Unchanged  His updated medication list for this problem includes:    Levothroid 88 Mcg Tabs (Levothyroxine sodium) ..... Once daily  Problem # 5:  LYMPHADENOPATHY (ICD-785.6) resolved Assessment: Comment Only  Complete Medication List: 1)  Levothroid 88 Mcg Tabs (Levothyroxine sodium) .... Once daily 2)  Avapro 300 Mg Tabs (Irbesartan) .... Once daily 3)  Furosemide 20 Mg Tabs (Furosemide) .Marland Kitchen.. 1  once daily 4)  Coreg 6.25 Mg Tabs (Carvedilol) .... 3 tabs two times a day 5)  Coumadin 5 Mg Tabs (Warfarin sodium) .... As dirrected 6)  Vitamin D3 1000 Unit Tabs (Cholecalciferol) .... Once daily 7)  Epipen 2-pak 0.3 Mg/0.51ml (1:1000) Devi (Epinephrine hcl (anaphylaxis)) .... As dirrected 8)  Botox Cosmetic 50 Unit Solr (Botulinum toxin type a (cosm)) .... 5 inject. each eye every 4 months  Other Orders: TD Toxoids IM 7 YR + (30865) Admin 1st Vaccine (78469) Admin 1st Vaccine [facility]) [id number])  Patient Instructions: 1)  Please schedule a follow-up appointment in 6 months well w/labs v70.0.   Tetanus/Td Vaccine    Vaccine Type: Td    Site: left deltoid    Mfr: Sanofi Pasteur    Dose: 0.5 ml    Route: IM    Given by: Lucious Groves    Exp. Date: 09/30/2011    Lot #: [id number]    VIS given: 08/05/07 version given January 10, 2010.

## 2010-10-19 NOTE — Medication Information (Signed)
Summary: rov/jm  Anticoagulant Therapy  Managed by: Rolland Porter, PharmD Referring MD: Graciela Husbands MD, Viviann Spare PCP: Posey Rea, MD Supervising MD: Tenny Craw MD, Gunnar Fusi Indication 1: Atrial Fibrillation (ICD-427.31) Lab Used: [facility] Site: [facility] INR POC 2.3 INR RANGE 2 - 3  Dietary changes: no    Health status changes: no    Bleeding/hemorrhagic complications: no    Recent/future hospitalizations: no    Any changes in medication regimen? no    Recent/future dental: yes     Details: Had tooth implants placed about 3 weeks ago, and was off Coumadin for 2 days prior to procedure.   Any missed doses?: no       Is patient compliant with meds? yes       Allergies: 1)  ! Welchol (Colesevelam Hcl) 2)  ! Zetia (Ezetimibe) 3)  ! * Clonidine 4)  ! Clonidine Hcl (Clonidine Hcl) 5)  ! Lipitor (Atorvastatin Calcium) 6)  ! Osteo Bi-Flex Adv Double St (Misc Natural Products) 7)  ! Lotensin 8)  ! Cardizem (Diltiazem Hcl) 9)  ! Amoxicillin (Amoxicillin) 10)  ! Amiodarone Hcl (Amiodarone Hcl)  Anticoagulation Management History:      The patient is taking warfarin and comes in today for a routine follow up visit.  Positive risk factors for bleeding include an age of 22 years or older.  The bleeding index is 'intermediate risk'.  Positive CHADS2 values include History of HTN and Age > 59 years old.  The start date was 06/11/2003.  His last INR was 2.7.  Anticoagulation responsible provider: Tenny Craw MD, Gunnar Fusi.  INR POC: 2.3.  Cuvette Lot#: [id number].  Exp: 07/2011.    Anticoagulation Management Assessment/Plan:      The patient's current anticoagulation dose is Coumadin 5 mg tabs: as dirrected.  The target INR is 2 - 3.  The next INR is due 07/24/2010.  Anticoagulation instructions were given to patient.  Results were reviewed/authorized by Rolland Porter, PharmD.  He was notified by Haynes Hoehn, PharmD Candidate.         Prior Anticoagulation Instructions: INR 2.7  Continue taking one tablet  every day. We will see you in four weeks.     Current Anticoagulation Instructions: INR 2.3  Continue Coumadin as scheduled:  1 tablet every day of the week.   Return to clinic in 4 weeks.

## 2010-10-19 NOTE — Procedures (Signed)
Summary: [facility]      Allergies Added:   Current Medications (verified): 1)  Levothroid 88 Mcg  Tabs (Levothyroxine Sodium) .... Once Daily 2)  Avapro 300 Mg  Tabs (Irbesartan) .... Once Daily 3)  Furosemide 20 Mg  Tabs (Furosemide) .Marland Kitchen.. 1 Once Daily 4)  Coreg 6.25 Mg  Tabs (Carvedilol) .... 3 Tabs Two Times A Day 5)  Coumadin 5 Mg Tabs (Warfarin Sodium) .... As Dirrected 6)  Vitamin D3 1000 Unit  Tabs (Cholecalciferol) .... Once Daily 7)  Epipen 2-Pak 0.3 Mg/0.66ml (1:1000)  Devi (Epinephrine Hcl (Anaphylaxis)) .... As Dirrected 8)  Botox Cosmetic 50 Unit  Solr (Botulinum Toxin Type A (Cosm)) .... 5 Inject. Each Eye Every 4 Months  Allergies (verified): 1)  ! Welchol (Colesevelam Hcl) 2)  ! Zetia (Ezetimibe) 3)  ! * Clonidine 4)  ! Clonidine Hcl (Clonidine Hcl) 5)  ! Lipitor (Atorvastatin Calcium) 6)  ! Osteo Bi-Flex Adv Double St (Misc Natural Products) 7)  ! Lotensin 8)  ! Cardizem (Diltiazem Hcl) 9)  ! Amoxicillin (Amoxicillin) 10)  ! Amiodarone Hcl (Amiodarone Hcl)  PPM Specifications Following MD:  Sherryl Manges, MD     PPM Vendor:  [employer]     PPM Model Number:  [id number]     PPM Serial Number:  [id number] PPM DOI:  10/16/2005     PPM Implanting MD:  Sherryl Manges, MD  Lead 1    Location: [facility]     DOI: 05/10/1997     Model #: [id number]     Serial #: [id number]     Status: active Lead 2    Location: RV     DOI: 05/10/1997     Model #: [id number]     Serial #: [id number]     Status: active   Indications:  SND   PPM Follow Up Battery Voltage:  2.76 V     Battery Est. Longevity:  5.25-7.37yrs     Pacer Dependent:  Yes       PPM Device Measurements Atrium  Amplitude: 5.0 mV, Impedance: 354 ohms,  Right Ventricle  Impedance: 1141 ohms, Threshold: 1.00 V at 0.8 msec  Episodes MS Episodes:  21     Percent Mode Switch:  77%     Coumadin:  Yes Ventricular High Rate:  0     Atrial Pacing:  23%     Ventricular Pacing:  99%  Parameters Mode:  DDIR     Lower Rate Limit:  65     Upper Rate Limit:   125 Paced AV Delay:  170     Tech Comments:  PT IN AF 77% OF TIME.  + COUMADIN.  PACER DEPENDENT --NO RWAVES AT VVI 30. NO CHANGES MADE.  ROV IN 6 MTHS W/SK.  Ricky [name]  April 17, 2010 10:35 AM

## 2010-10-19 NOTE — Medication Information (Signed)
Summary: rov/ewj  Anticoagulant Therapy  Managed by: Weston Brass, PharmD Referring MD: Daisey Must PCP: Posey Rea, MD Supervising MD: Antoine Poche MD, Fayrene Fearing Indication 1: Atrial Fibrillation (ICD-427.31) Lab Used: [facility] Site: [facility] INR POC 2.2 INR RANGE 2 - 3  Dietary changes: no    Health status changes: no    Bleeding/hemorrhagic complications: no    Recent/future hospitalizations: no    Any changes in medication regimen? no    Recent/future dental: no  Any missed doses?: no       Is patient compliant with meds? yes       Allergies: 1)  ! Welchol (Colesevelam Hcl) 2)  ! Zetia (Ezetimibe) 3)  ! * Clonidine 4)  ! Clonidine Hcl (Clonidine Hcl) 5)  ! Lipitor (Atorvastatin Calcium) 6)  ! Osteo Bi-Flex Adv Double St (Misc Natural Products) 7)  ! Lotensin 8)  ! Cardizem (Diltiazem Hcl) 9)  ! Amoxicillin (Amoxicillin) 10)  ! Amiodarone Hcl (Amiodarone Hcl)  Anticoagulation Management History:      The patient is taking warfarin and comes in today for a routine follow up visit.  Positive risk factors for bleeding include an age of 75 years or older.  The bleeding index is 'intermediate risk'.  Positive CHADS2 values include History of HTN and Age > 50 years old.  The start date was 06/11/2003.  His last INR was 2.7.  Anticoagulation responsible provider: Antoine Poche MD, Fayrene Fearing.  INR POC: 2.2.  Cuvette Lot#: [id number].  Exp: 06/2011.    Anticoagulation Management Assessment/Plan:      The patient's current anticoagulation dose is Coumadin 5 mg tabs: as dirrected.  The target INR is 2 - 3.  The next INR is due 05/01/2010.  Anticoagulation instructions were given to patient.  Results were reviewed/authorized by Weston Brass, PharmD.  He was notified by Dillard Cannon.         Prior Anticoagulation Instructions: INR 2.1  Continue on same dosage 5mg  daily.  Recheck in 4 weeks.    Current Anticoagulation Instructions: INR 2.2  Continue same dose of 1 tab daily.  Re-check  in 4 weeks.

## 2010-10-19 NOTE — Medication Information (Signed)
Summary: rov/ewj  Anticoagulant Therapy  Managed by: Cloyde Reams, RN, BSN Referring MD: Daisey Must PCP: Posey Rea, MD Supervising MD: Graciela Husbands MD, Viviann Spare Indication 1: Atrial Fibrillation (ICD-427.31) Lab Used: [facility] Site: [facility] INR POC 2.6 INR RANGE 2 - 3  Dietary changes: no    Health status changes: no    Bleeding/hemorrhagic complications: no    Recent/future hospitalizations: no    Any changes in medication regimen? yes       Details: Started Avelox 1/22. Will finish 1/28.  Recent/future dental: no  Any missed doses?: no       Is patient compliant with meds? yes       Allergies: 1)  ! Welchol (Colesevelam Hcl) 2)  ! Zetia (Ezetimibe) 3)  ! * Clonidine 4)  ! Clonidine Hcl (Clonidine Hcl) 5)  ! Lipitor (Atorvastatin Calcium) 6)  ! Osteo Bi-Flex Adv Double St (Misc Natural Products) 7)  ! Lotensin 8)  ! Cardizem (Diltiazem Hcl) 9)  ! Amoxicillin (Amoxicillin) 10)  ! Amiodarone Hcl (Amiodarone Hcl)  Anticoagulation Management History:      The patient is taking warfarin and comes in today for a routine follow up visit.  Positive risk factors for bleeding include an age of 75 years or older.  The bleeding index is 'intermediate risk'.  Positive CHADS2 values include History of HTN and Age > 36 years old.  The start date was 06/11/2003.  His last INR was 2.7.  Anticoagulation responsible provider: Graciela Husbands MD, Viviann Spare.  INR POC: 2.6.  Cuvette Lot#: [id number].  Exp: 12/2010.    Anticoagulation Management Assessment/Plan:      The patient's current anticoagulation dose is Coumadin 5 mg tabs: as dirrected.  The target INR is 2 - 3.  The next INR is due 11/10/2009.  Anticoagulation instructions were given to patient.  Results were reviewed/authorized by Cloyde Reams, RN, BSN.  He was notified by Lew Dawes, PharmD Candidate.         Prior Anticoagulation Instructions: INR 2.1  Continue on same dosage 1 tablet (5mg ) daily.  Recheck in 4 weeks.    Current  Anticoagulation Instructions: INR 2.6  Continue taking 5mg  daily. Recheck in 4 weeks.

## 2010-10-23 ENCOUNTER — Encounter: Payer: Self-pay | Admitting: Internal Medicine

## 2010-10-23 ENCOUNTER — Encounter (INDEPENDENT_AMBULATORY_CARE_PROVIDER_SITE_OTHER): Payer: MEDICARE | Admitting: Internal Medicine

## 2010-10-23 DIAGNOSIS — I442 Atrioventricular block, complete: Secondary | ICD-10-CM

## 2010-10-23 DIAGNOSIS — Z950 Presence of cardiac pacemaker: Secondary | ICD-10-CM

## 2010-10-24 ENCOUNTER — Encounter (INDEPENDENT_AMBULATORY_CARE_PROVIDER_SITE_OTHER): Payer: MEDICARE

## 2010-10-24 ENCOUNTER — Encounter: Payer: Self-pay | Admitting: Cardiovascular Disease

## 2010-10-24 DIAGNOSIS — Z7901 Long term (current) use of anticoagulants: Secondary | ICD-10-CM

## 2010-10-24 DIAGNOSIS — I4891 Unspecified atrial fibrillation: Secondary | ICD-10-CM

## 2010-10-31 DIAGNOSIS — I4891 Unspecified atrial fibrillation: Secondary | ICD-10-CM

## 2010-10-31 DIAGNOSIS — I4821 Permanent atrial fibrillation: Secondary | ICD-10-CM | POA: Insufficient documentation

## 2010-10-31 HISTORY — DX: Unspecified atrial fibrillation: I48.91

## 2010-11-02 NOTE — Medication Information (Signed)
Summary: [facility]  Anticoagulant Therapy  Managed by: Windell Hummingbird, RN Referring MD: Graciela Husbands MD, Viviann Spare PCP: Posey Rea, MD Supervising MD: Eden Emms MD, Theron Arista Indication 1: Atrial Fibrillation (ICD-427.31) Lab Used: [facility] Site: [facility] INR POC 2.6 INR RANGE 2 - 3  Dietary changes: no    Health status changes: no    Bleeding/hemorrhagic complications: no    Recent/future hospitalizations: no    Any changes in medication regimen? no    Recent/future dental: no  Any missed doses?: no       Is patient compliant with meds? yes       Allergies: 1)  ! Welchol (Colesevelam Hcl) 2)  ! Zetia (Ezetimibe) 3)  ! * Clonidine 4)  ! Clonidine Hcl (Clonidine Hcl) 5)  ! Lipitor (Atorvastatin Calcium) 6)  ! Osteo Bi-Flex Adv Double St (Misc Natural Products) 7)  ! Lotensin 8)  ! Cardizem (Diltiazem Hcl) 9)  ! Amoxicillin (Amoxicillin) 10)  ! Amiodarone Hcl (Amiodarone Hcl)  Anticoagulation Management History:      The patient is taking warfarin and comes in today for a routine follow up visit.  Positive risk factors for bleeding include an age of 75 years or older.  The bleeding index is 'intermediate risk'.  Positive CHADS2 values include History of HTN and Age > 60 years old.  The start date was 06/11/2003.  His last INR was 2.7.  Anticoagulation responsible provider: Eden Emms MD, Theron Arista.  INR POC: 2.6.  Cuvette Lot#: [id number].  Exp: 09/2011.    Anticoagulation Management Assessment/Plan:      The patient's current anticoagulation dose is Coumadin 5 mg tabs: as dirrected.  The target INR is 2 - 3.  The next INR is due 11/21/2010.  Anticoagulation instructions were given to patient.  Results were reviewed/authorized by Windell Hummingbird, RN.  He was notified by Windell Hummingbird, RN.         Prior Anticoagulation Instructions: INR 2.4  Continue on same dosage 5mg  daily.  Recheck in 4 weeks.    Current Anticoagulation Instructions: INR 2.6 Continue taking 1 tablet every day. Recheck  in 4 weeks.

## 2010-11-02 NOTE — Assessment & Plan Note (Signed)
Summary: 1 year rovg/kl  Medications Added MULTIVITAMINS  TABS (MULTIPLE VITAMIN) once daily      Allergies Added:   Primary Provider:  Plotnikov, MD  CC:  1 year rov.  History of Present Illness:   [name] is seen in followup for atrial fibrillation and a permanent. He is status post AV junction ablation following 2 failed PPIs. He is status post pacemaker implantation and his device dependent.  The patient denies SOB, chest pain, edema or palpitations   Current Medications (verified): 1)  Levothroid 88 Mcg  Tabs (Levothyroxine Sodium) .... Once Daily 2)  Avapro 300 Mg  Tabs (Irbesartan) .... Once Daily 3)  Furosemide 20 Mg  Tabs (Furosemide) .Marland Kitchen.. 1 Once Daily 4)  Coreg 6.25 Mg  Tabs (Carvedilol) .... 3 Tabs Two Times A Day 5)  Coumadin 5 Mg Tabs (Warfarin Sodium) .... As Dirrected 6)  Vitamin D3 1000 Unit  Tabs (Cholecalciferol) .... Once Daily 7)  Epipen 2-Pak 0.3 Mg/0.40ml (1:1000)  Devi (Epinephrine Hcl (Anaphylaxis)) .... As Dirrected 8)  Botox Cosmetic 50 Unit  Solr (Botulinum Toxin Type A (Cosm)) .... 2.5 Inject. Each Eye Every 4 Months 9)  Multivitamins  Tabs (Multiple Vitamin) .... Once Daily  Allergies (verified): 1)  ! Welchol (Colesevelam Hcl) 2)  ! Zetia (Ezetimibe) 3)  ! * Clonidine 4)  ! Clonidine Hcl (Clonidine Hcl) 5)  ! Lipitor (Atorvastatin Calcium) 6)  ! Osteo Bi-Flex Adv Double St (Misc Natural Products) 7)  ! Lotensin 8)  ! Cardizem (Diltiazem Hcl) 9)  ! Amoxicillin (Amoxicillin) 10)  ! Amiodarone Hcl (Amiodarone Hcl)  Past History:  Past Medical History: Last updated: 02/09/2009 PACEMAKER, PERMANENT (ICD-V45.01) ATRIAL FIBRILLATION, PAROXYSMAL (ICD-427.31) AV BLOCK, COMPLETE (ICD-426.0) ANTICOAGULATION THERAPY (ICD-V58.61) ESSENTIAL HYPERTENSION, BENIGN (ICD-401.1) RUQ PAIN (ICD-789.01) ANXIETY (ICD-300.00) HYPOTHYROIDISM (ICD-244.9) BEE STING (ICD-989.5) HA controlled on Depakote  Past Surgical History: Last updated:  02/09/2009 [facility] - PCM - 10/16/05  Family History: Last updated: 09/04/2007 Family History Hypertension  Social History: Last updated: 07/14/2010 Retired Single Former Smoker Alcohol Use - no Drug Use - no Regular exercise-yes  Vital Signs:  Patient profile:   77 year old male Height:      70 inches Weight:      160 pounds BMI:     23.04 Pulse rate:   65 / minute Pulse rhythm:   regular BP sitting:   164 / 84  (left arm) Cuff size:   regular  Vitals Entered By: Judithe Modest CMA (October 23, 2010 1:48 PM)  Serial Vital Signs/Assessments:  Time      Position  BP       Pulse  Resp  Temp     By 2:02 PM             156/82   64                    Judithe Modest CMA   Physical Exam  General:  The patient was alert and oriented in no acute distress. HEENT Normal.  Neck veins were flat, carotids were brisk.  Lungs were clear.  Heart sounds were regular without murmurs or gallops.  Abdomen was soft with active bowel sounds. There is no clubbing cyanosis or edema. Skin Warm and dry    EKG  Procedure date:  10/23/2010  Findings:      afib chb pacer  PPM Specifications Following MD:  Sherryl Manges, MD     PPM Vendor:  [facility]  PPM Model Number:  [id number]     PPM Serial Number:  [id number] PPM DOI:  10/16/2005     PPM Implanting MD:  Sherryl Manges, MD  Lead 1    Location: [facility]     DOI: 05/10/1997     Model #: [id number]     Serial #: [id number]     Status: active Lead 2    Location: RV     DOI: 05/10/1997     Model #: 1346T     Serial #: [id number]     Status: active   Indications:  SND   PPM Follow Up Remote Check?  No Battery Voltage:  2.76 V     Battery Est. Longevity:  5 years     Pacer Dependent:  Yes       PPM Device Measurements Atrium  Amplitude: 4.8 mV, Impedance: 357 ohms,  Right Ventricle  Amplitude: 12 mV, Impedance: 1141 ohms, Threshold: 1.0 V at 0.8 msec  Episodes MS Episodes:  1     Percent Mode Switch:  100%     Coumadin:  Yes Atrial  Pacing:  <1%     Ventricular Pacing:  100%  Parameters Mode:  DDIR     Lower Rate Limit:  65     Upper Rate Limit:  125 Paced AV Delay:  170     Next Cardiology Appt Due:  04/18/2011 Tech Comments:  No parameter changes.  Device function normal.  A-flutter with ventricular response @ 30.  No TTM's.  ROV 6 months clinic. Altha Harm, LPN  October 23, 2010 2:36 PM   Impression & Recommendations:  Problem # 1:  ATRIAL FIBRILLATION PERMANENT (ICD-427.31) on coumadin.  discussed alternative agents   will continue with warfarin His updated medication list for this problem includes:    Coreg 6.25 Mg Tabs (Carvedilol) .Marland KitchenMarland KitchenMarland KitchenMarland Kitchen 3 tabs two times a day    Coumadin 5 Mg Tabs (Warfarin sodium) .Marland Kitchen... As dirrected  Problem # 2:  PACEMAKER, ST J (ICD-V45.01) Device parameters and data were reviewed and no changes were made  Problem # 3:  AV BLOCK, COMPLETE S/P AV ABLATION (ICD-426.0)  stable  His updated medication list for this problem includes:    Coreg 6.25 Mg Tabs (Carvedilol) .Marland KitchenMarland KitchenMarland KitchenMarland Kitchen 3 tabs two times a day    Coumadin 5 Mg Tabs (Warfarin sodium) .Marland Kitchen... As dirrected  His updated medication list for this problem includes:    Coreg 6.25 Mg Tabs (Carvedilol) .Marland KitchenMarland KitchenMarland KitchenMarland Kitchen 3 tabs two times a day    Coumadin 5 Mg Tabs (Warfarin sodium) .Marland Kitchen... As dirrected  Patient Instructions: 1)  Your physician recommends that you continue on your current medications as directed. Please refer to the Current Medication list given to you today. 2)  Your physician wants you to follow-up in:   6 months with kristin and paula and year with Dr Logan Bores will receive a reminder letter in the mail two months in advance. If you don't receive a letter, please call our office to schedule the follow-up appointment.

## 2010-11-21 ENCOUNTER — Encounter: Payer: Self-pay | Admitting: Cardiology

## 2010-11-21 ENCOUNTER — Encounter (INDEPENDENT_AMBULATORY_CARE_PROVIDER_SITE_OTHER): Payer: Medicare Other

## 2010-11-21 DIAGNOSIS — I4891 Unspecified atrial fibrillation: Secondary | ICD-10-CM

## 2010-11-21 DIAGNOSIS — Z7901 Long term (current) use of anticoagulants: Secondary | ICD-10-CM

## 2010-11-21 LAB — CONVERTED CEMR LAB: POC INR: 2.9

## 2010-11-28 NOTE — Medication Information (Signed)
Summary: rov/pc  Anticoagulant Therapy  Managed by: Cloyde Reams, RN, BSN Referring MD: Graciela Husbands MD, Viviann Spare PCP: Posey Rea, MD Supervising MD: Antoine Poche MD, Fayrene Fearing Indication 1: Atrial Fibrillation (ICD-427.31) Lab Used: [facility] Site: [facility] INR POC 2.9 INR RANGE 2 - 3  Dietary changes: no    Health status changes: no    Bleeding/hemorrhagic complications: no    Recent/future hospitalizations: no    Any changes in medication regimen? yes       Details: Taking abx x 1 week for dental resons, clindamycin  Recent/future dental: no  Any missed doses?: no       Is patient compliant with meds? yes       Allergies: 1)  ! Welchol (Colesevelam Hcl) 2)  ! Zetia (Ezetimibe) 3)  ! * Clonidine 4)  ! Clonidine Hcl (Clonidine Hcl) 5)  ! Lipitor (Atorvastatin Calcium) 6)  ! Osteo Bi-Flex Adv Double St (Misc Natural Products) 7)  ! Lotensin 8)  ! Cardizem (Diltiazem Hcl) 9)  ! Amoxicillin (Amoxicillin) 10)  ! Amiodarone Hcl (Amiodarone Hcl)  Anticoagulation Management History:      The patient is taking warfarin and comes in today for a routine follow up visit.  Positive risk factors for bleeding include an age of 70 years or older.  The bleeding index is 'intermediate risk'.  Positive CHADS2 values include History of HTN and Age > 5 years old.  The start date was 06/11/2003.  His last INR was 2.7.  Anticoagulation responsible provider: Antoine Poche MD, Fayrene Fearing.  INR POC: 2.9.  Cuvette Lot#: [id number].  Exp: 09/2011.    Anticoagulation Management Assessment/Plan:      The patient's current anticoagulation dose is Coumadin 5 mg tabs: as dirrected.  The target INR is 2 - 3.  The next INR is due 12/19/2010.  Anticoagulation instructions were given to patient.  Results were reviewed/authorized by Cloyde Reams, RN, BSN.  He was notified by Cloyde Reams RN.         Prior Anticoagulation Instructions: INR 2.6 Continue taking 1 tablet every day. Recheck in 4 weeks.  Current  Anticoagulation Instructions: INR 2.9  Continue on same dosage 1 tablet daily.  Recheck in 4 weeks.

## 2010-12-19 ENCOUNTER — Ambulatory Visit (INDEPENDENT_AMBULATORY_CARE_PROVIDER_SITE_OTHER): Payer: Medicare Other | Admitting: *Deleted

## 2010-12-19 DIAGNOSIS — I4891 Unspecified atrial fibrillation: Secondary | ICD-10-CM

## 2010-12-19 DIAGNOSIS — Z7901 Long term (current) use of anticoagulants: Secondary | ICD-10-CM

## 2010-12-19 LAB — POCT INR: INR: 2.4

## 2011-01-02 ENCOUNTER — Other Ambulatory Visit: Payer: Self-pay | Admitting: Internal Medicine

## 2011-01-02 ENCOUNTER — Other Ambulatory Visit: Payer: Self-pay

## 2011-01-02 DIAGNOSIS — Z0000 Encounter for general adult medical examination without abnormal findings: Secondary | ICD-10-CM

## 2011-01-02 DIAGNOSIS — Z0389 Encounter for observation for other suspected diseases and conditions ruled out: Secondary | ICD-10-CM

## 2011-01-03 ENCOUNTER — Encounter: Payer: Self-pay | Admitting: Internal Medicine

## 2011-01-04 ENCOUNTER — Other Ambulatory Visit (INDEPENDENT_AMBULATORY_CARE_PROVIDER_SITE_OTHER): Payer: Medicare Other

## 2011-01-04 DIAGNOSIS — Z125 Encounter for screening for malignant neoplasm of prostate: Secondary | ICD-10-CM

## 2011-01-04 DIAGNOSIS — Z0000 Encounter for general adult medical examination without abnormal findings: Secondary | ICD-10-CM

## 2011-01-04 DIAGNOSIS — Z0389 Encounter for observation for other suspected diseases and conditions ruled out: Secondary | ICD-10-CM

## 2011-01-04 DIAGNOSIS — E785 Hyperlipidemia, unspecified: Secondary | ICD-10-CM

## 2011-01-04 DIAGNOSIS — I10 Essential (primary) hypertension: Secondary | ICD-10-CM

## 2011-01-04 DIAGNOSIS — I4891 Unspecified atrial fibrillation: Secondary | ICD-10-CM

## 2011-01-04 LAB — LIPID PANEL
HDL: 60.4 mg/dL (ref >39.00)
Total CHOL/HDL Ratio: 3
Triglycerides: 65 mg/dL (ref 0.0–149.0)
VLDL: 13 mg/dL (ref 0.0–40.0)

## 2011-01-04 LAB — TSH: TSH: 1.29 u[IU]/mL (ref 0.35–5.50)

## 2011-01-04 LAB — HEPATIC FUNCTION PANEL
Albumin: 3.9 g/dL (ref 3.5–5.2)
Alkaline Phosphatase: 75 U/L (ref 39–117)
Bilirubin, Direct: 0.1 mg/dL (ref 0.0–0.3)
Total Protein: 6.4 g/dL (ref 6.0–8.3)

## 2011-01-04 LAB — CBC WITH DIFFERENTIAL/PLATELET
Basophils Absolute: 0 10*3/uL (ref 0.0–0.1)
Eosinophils Absolute: 0.2 10*3/uL (ref 0.0–0.7)
Lymphocytes Relative: 34 % (ref 12.0–46.0)
MCHC: 34.1 g/dL (ref 30.0–36.0)
MCV: 101.4 fl — ABNORMAL HIGH (ref 78.0–100.0)
Monocytes Absolute: 0.5 10*3/uL (ref 0.1–1.0)
Neutrophils Relative %: 53.2 % (ref 43.0–77.0)
RDW: 13.6 % (ref 11.5–14.6)

## 2011-01-04 LAB — URINALYSIS
Bilirubin Urine: NEGATIVE
Ketones, ur: NEGATIVE
Leukocytes, UA: NEGATIVE
Specific Gravity, Urine: <=1.005 (ref 1.000–1.030)
Urine Glucose: NEGATIVE
pH: 6.5 (ref 5.0–8.0)

## 2011-01-04 LAB — BASIC METABOLIC PANEL
BUN: 14 mg/dL (ref 6–23)
CO2: 32 mEq/L (ref 19–32)
Calcium: 9.1 mg/dL (ref 8.4–10.5)
Chloride: 103 mEq/L (ref 96–112)
Creatinine, Ser: 0.8 mg/dL (ref 0.4–1.5)
Glucose, Bld: 83 mg/dL (ref 70–99)

## 2011-01-04 NOTE — Progress Notes (Signed)
Quick Note:  Voice message left on [employer] system - lab is negative, normal or otherwise stable, pt to continue same tx ______ 

## 2011-01-09 ENCOUNTER — Ambulatory Visit (INDEPENDENT_AMBULATORY_CARE_PROVIDER_SITE_OTHER): Payer: Medicare Other | Admitting: Internal Medicine

## 2011-01-09 ENCOUNTER — Encounter: Payer: Self-pay | Admitting: Internal Medicine

## 2011-01-09 DIAGNOSIS — R799 Abnormal finding of blood chemistry, unspecified: Secondary | ICD-10-CM

## 2011-01-09 DIAGNOSIS — I4891 Unspecified atrial fibrillation: Secondary | ICD-10-CM

## 2011-01-09 DIAGNOSIS — Z0000 Encounter for general adult medical examination without abnormal findings: Secondary | ICD-10-CM | POA: Insufficient documentation

## 2011-01-09 DIAGNOSIS — T6391XA Toxic effect of contact with unspecified venomous animal, accidental (unintentional), initial encounter: Secondary | ICD-10-CM

## 2011-01-09 MED ORDER — PSEUDOEPHEDRINE HCL 30 MG PO TABS: 30.0000 mg | ORAL_TABLET | Freq: Four times a day (QID) | ORAL | Status: DC | PRN

## 2011-01-09 MED ORDER — PREDNISONE 10 MG PO TABS: ORAL_TABLET | ORAL | Status: DC

## 2011-01-09 MED ORDER — DIPHENHYDRAMINE HCL 25 MG PO CAPS: 25.0000 mg | ORAL_CAPSULE | ORAL | Status: DC | PRN

## 2011-01-09 NOTE — Assessment & Plan Note (Signed)
Rate controlled 

## 2011-01-09 NOTE — Assessment & Plan Note (Signed)
The patient is here for annual Medicare wellness examination and management of other chronic and acute problems.   The risk factors are reflected in the social history.  The roster of all physicians providing medical care to patient - is listed in the Snapshot section of the chart.  Activities of daily living:  The patient is 100% inedpendent in all ADLs: dressing, toileting, feeding as well as independent mobility  Home safety : The patient has smoke detectors in the home. They wear seatbelts.No firearms at home ( firearms are present in the home, kept in a safe fashion). There is no violence in the home.   There is no risks for hepatitis, STDs or HIV. There is no   history of blood transfusion. They have no travel history to infectious disease endemic areas of the world.  The patient has (has not) seen their dentist in the last six month. They have (not) seen their eye doctor in the last year. They deny (admit to) any hearing difficulty and have not had audiologic testing in the last year.  They do not  have excessive sun exposure. Discussed the need for sun protection: hats, long sleeves and use of sunscreen if there is significant sun exposure.   Diet: the importance of a healthy diet is discussed. They do have a healthy (unhealthy-high fat/fast food) diet.  The patient has a regular exercise program: _____60__ , ____duration, ___4__per week.  The benefits of regular aerobic exercise were discussed.  Depression screen: there are no signs or vegative symptoms of depression- irritability, change in appetite, anhedonia, sadness/tearfullness.  Cognitive assessment: the patient manages all their financial and personal affairs and is actively engaged. They could relate day,date,year and events; recalled 3/3 objects at 3 minutes; performed clock-face test normally.  The following portions of the patient's history were reviewed and updated as appropriate: allergies, current medications, past family  history, past medical history,  past surgical history, past social history  and problem list.  Vision, hearing, body mass index were assessed and reviewed.   During the course of the visit the patient was educated and counseled about appropriate screening and preventive services including : fall prevention , diabetes screening, nutrition counseling, colorectal cancer screening, and recommended immunizations.

## 2011-01-09 NOTE — Progress Notes (Signed)
Subjective:    Patient ID: Ricky [name], male    DOB: Dec 26, 1922, 77 y.o.   MRN: [id number]  HPI  The patient presents for a follow-up of  Chronic A fib,  hypertension, chronic dyslipidemia controlled with medicines The patient is here for a wellness exam. The patient has been doing well overall without major physical or psychological issues going on lately.   Review of Systems     Objective:   Physical Exam     Review of Systems  Constitutional: Negative for chills, diaphoresis, appetite change and fatigue. Negative for unexpected weight change.  HENT: Negative for congestion, facial swelling and neck pain.  Eyes: Negative for visual disturbance.  Respiratory: Negative for cough, shortness of breath and wheezing.  Cardiovascular: Negative for leg swelling.  Gastrointestinal: Negative for nausea, vomiting, abdominal pain and abdominal distention. Negative for diarrhea, constipation and anal bleeding.  Genitourinary: Negative for flank pain.  Musculoskeletal: Negative for myalgias, joint swelling and gait problem.  Skin: Negative for rash. Negative for pallor and wound.  Psychiatric/Behavioral: Negative for dysphoric mood. Negative for suicidal ideas, behavioral problems, confusion and decreased concentration. The patient is not nervous/anxious.     Objective:    Physical Exam  Constitutional:The patient is oriented to person, place, and time. He appears well-developed.  HENT:  Mouth/Throat: Oropharynx is clear and moist.  Eyes: Conjunctivae are normal. Pupils are equal, round, and reactive to light.  Neck: Normal range of motion. No JVD present. No thyromegaly present.  Cardiovascular: Normal rate, regular rhythm, normal heart sounds and intact distal pulses. Exam reveals no gallop and no friction rub.  No murmur heard.  Pulmonary/Chest: Effort normal and breath sounds normal. No respiratory distress. He has no wheezes. He has no rales. He exhibits no tenderness.    Abdominal: Soft. Bowel sounds are normal. He exhibits no distension and no mass. There is no tenderness. There is no rebound and no guarding.  Musculoskeletal: Normal range of motion. He exhibits no edema and no tenderness.  Lymphadenopathy:  He has no cervical adenopathy.  Neurological: He is alert and oriented to person, place, and time. He has normal reflexes. No cranial nerve deficit. He exhibits normal muscle tone. Coordination normal.  Skin: Skin is warm and dry. No rash noted.  Psychiatric: He has a normal mood and affect. His behavior is normal. Judgment and thought content normal.  Prostate - per Urology   Lab Results  Component Value Date   WBC 5.2 01/04/2011   HGB 14.1 01/04/2011   HCT 41.5 01/04/2011   PLT 217.0 01/04/2011   CHOL 190 01/04/2011   TRIG 65.0 01/04/2011   HDL 60.40 01/04/2011   ALT 20 01/04/2011   AST 25 01/04/2011   NA 141 01/04/2011   K 4.7 01/04/2011   CL 103 01/04/2011   CREATININE 0.8 01/04/2011   BUN 14 01/04/2011   CO2 32 01/04/2011   TSH 1.29 01/04/2011   PSA 0.89 01/04/2011   INR 2.4 12/19/2010        Assessment & Plan:  Well adult exam The patient is here for annual Medicare wellness examination and management of other chronic and acute problems.   The risk factors are reflected in the social history.  The roster of all physicians providing medical care to patient - is listed in the Snapshot section of the chart.  Activities of daily living:  The patient is 100% inedpendent in all ADLs: dressing, toileting, feeding as well as independent mobility  Home safety : The  patient has smoke detectors in the home. They wear seatbelts.No firearms at home ( firearms are present in the home, kept in a safe fashion). There is no violence in the home.   There is no risks for hepatitis, STDs or HIV. There is no   history of blood transfusion. They have no travel history to infectious disease endemic areas of the world.  The patient has (has not) seen their  dentist in the last six month. They have (not) seen their eye doctor in the last year. They deny (admit to) any hearing difficulty and have not had audiologic testing in the last year.  They do not  have excessive sun exposure. Discussed the need for sun protection: hats, long sleeves and use of sunscreen if there is significant sun exposure.   Diet: the importance of a healthy diet is discussed. They do have a healthy (unhealthy-high fat/fast food) diet.  The patient has a regular exercise program: _____60__ , ____duration, ___4__per week.  The benefits of regular aerobic exercise were discussed.  Depression screen: there are no signs or vegative symptoms of depression- irritability, change in appetite, anhedonia, sadness/tearfullness.  Cognitive assessment: the patient manages all their financial and personal affairs and is actively engaged. They could relate day,date,year and events; recalled 3/3 objects at 3 minutes; performed clock-face test normally.  The following portions of the patient's history were reviewed and updated as appropriate: allergies, current medications, past family history, past medical history,  past surgical history, past social history  and problem list.  Vision, hearing, body mass index were assessed and reviewed.   During the course of the visit the patient was educated and counseled about appropriate screening and preventive services including : fall prevention , diabetes screening, nutrition counseling, colorectal cancer screening, and recommended immunizations.   CBC, ABNORMAL Reviewed. Doing OK  BEE STING Meds discussed  Atrial fibrillation Rate controlled

## 2011-01-09 NOTE — Assessment & Plan Note (Signed)
Meds discussed

## 2011-01-09 NOTE — Assessment & Plan Note (Signed)
Reviewed. Doing OK

## 2011-01-09 NOTE — Patient Instructions (Signed)
For bee stings: take Benadryl 2 tabs, Sudafed 2 tabs and Prednisone 4 tabs. Use with Epipen.  Call MD.

## 2011-01-16 ENCOUNTER — Ambulatory Visit (INDEPENDENT_AMBULATORY_CARE_PROVIDER_SITE_OTHER): Payer: Medicare Other | Admitting: *Deleted

## 2011-01-16 DIAGNOSIS — I4891 Unspecified atrial fibrillation: Secondary | ICD-10-CM

## 2011-01-16 LAB — POCT INR: INR: 2.7

## 2011-01-30 NOTE — Assessment & Plan Note (Signed)
Ricky [name]                         ELECTROPHYSIOLOGY OFFICE NOTE   Ricky [name]                     MRN:          [id number]  DATE:04/24/2007                            DOB:          01-10-1923    PRIMARY CARE GIVER:  Ricky [name].   ELECTROPHYSIOLOGIST:  Ricky [name].   ALLERGIES:  TO WELCHOL, LIPITOR, ZETIA, CLONIDINE.  HE HAS INTOLERANCE  OF AMIODARONE AND FLECAINIDE CAUSED LOWER EXTREMITY EDEMA, SO BOTH OF  THESE HAVE BEEN TRIED FOR HIS ATRIAL FIBRILLATION FLUTTER IN THE PAST.   The patient has a history of atrial fibrillation/flutter.  He had atrial  fibrillation ablation at [facility] September 2005.  He had  recurrence and repeat ablation in October 2006.  This was done in the  presence of Flecainide.  He failed Flecainide secondary to lower  extremity edema.  He was tried then in November 2006 on Tikosyn and  converted to sinus rhythm.  However he has had trouble with recurrent  atrial fibrillation.  He had AV node ablation September 25, 2006.  Previously, he had a pacemaker implanted in 1998 for sinus node  dysfunction/syncope set at [facility].  His syncope was thought perhaps caused  by atrial fibrillation post-termination pauses.  Patient's secondary  diagnoses are hypertension, New York Heart Association Class II-III  congestive heart failure, moderate mitral regurgitation, moderate to  severe tricuspid regurgitation, sick sinus syndrome with pacemaker,  paroxysmal atrial fibrillation, dyslipidemia and hypothyroidism which is  treated but it was secondary to amiodarone therapy.   Patient is queried about his exercise tolerance since the AV node  ablation, he said that he is doing well.  He walks his dog 3 times a  day.  Has noted no change actually in his exercise tolerance since prior  to the ablation, but he notes that the palpitations, which were  bothersome,  have completely disappeared.   Currently he has been  bothered by headaches.  He noted they started back  in May and were intermittent.  He had an appointment with Ricky [name]  May 28 and for a while they disappeared.  However he had recurrence of  headaches in June 2008.  He has very specific symptoms.  An aching  starts behind the left eye and spreads to the left ear.  If he closes  the left eye, the headache stops.  These have been occurring since June  2008.  He describes feeling as someone might feel with a hangover.  They  start about 11 o'clock to 12 o'clock each day and every day, and exist  until he closes his eyes at bedtime.  He had a computed tomogram of the  head on August 4.  The study shows mild age-related atrophy, minor  chronic type small vessel changes in the deep white matter, no cortical  or large vessel stroke, no sign of mass hemorrhage, hydrocephalus or  extra-axial collection.  There is benign physiologic calcification in  the basal ganglia, apparently not of any clinical relevance.  They see  no fluid in the paranasal sinuses, the middle ear or mastoids.  There  are no acute orbital or retroorbital pathologies.   The patient did have some question about Avapro as an antihypertensive,  primarily because of its cost.  Ricky [name] at last meeting restarted him  on Lotensin which would be less expensive, however the patient recalls  that he had a dry cough with ACE inhibitors and so he is taking his  Avapro and says this will be fine.   From the cardiac standpoint the patient is doing well, he is glad to be  rid of his palpitations and is exercising to his fullest extent and is  happy with his level of exercise.  He is being plagued by these  headaches which seem to be recurrent with a regular feature and specific  symptoms.  I am making referral to [facility] and  will send them the requisite information, including this note.   MEDICATIONS:  1. Coumadin per the pharmacy here at [facility]  2. Levothyroxine 88 mcg daily.  3. Coreg 18.75 mg twice daily.  The patient was tried at 25 mg twice      daily but had excess fatigue and went back to 18.75.  4. Avapro 300 mg daily.  5. Lasix 20 mg daily.  6. K-Dur 20 mEq 1/2 tablet every other day.  7. Vitamin D3 one thousand units daily.      Ricky Mirza, PA  Electronically Signed      Ricky Salvia, MD, [facility]  Electronically Signed   GM/MedQ  DD: 04/24/2007  DT: 04/25/2007  Job #: [id number]

## 2011-01-30 NOTE — Assessment & Plan Note (Signed)
[facility]                         ELECTROPHYSIOLOGY OFFICE NOTE   Ricky [name], Ricky [name]                     MRN:          [id number]  DATE:03/31/2007                            DOB:          [birth year]    [name] comes in following AV junction ablation for his uncontrolled  atrial fibrillation.  He is doing pretty well from this point of view.  He still continues to have leg issues previously attributed to his  Statin therapy and his amiodarone.  These are much improved but still  limiting.   He also notes that he has had a problem with a headache over the last  couple of months.  This is new for him.  They initially felt it was  related to his glucosamine.  They stopped it.  It has resumed.   He also addressed the question of whether he come off his Avapro,  because of it's expense.  Review of his old chart demonstrates he was on  Lotensin for a short period of time in 2003, and based on the atrial  fibrillation data, I suggested to Dr. Posey Rea that they use irbesartan  which he has done, and he has tolerated this well apart from the effect  on his wallet.   EXAMINATION:  VITAL SIGNS:  On examination today though, his blood  pressure remains quite elevated at 161/93.  His pulse was 65.  LUNGS:  Clear.  HEART:  Sounds were regular.  EXTREMITIES:  Were without edema.   Interrogation of his pacemaker demonstrates adequate heart rate  response.   IMPRESSION:  1. Atrial fibrillation with prior PVI, now status post AV junction      ablation.  2. Status post pacemaker for #1.  3. Headaches, question mechanism, possibly blood pressure.  4. Blood pressure - poorly controlled.  5. Cost difficulties with Avapro.   We will plan to discontinue [name] today and put him on  Lotensin at 24.  I will have him follow up with Loura Pardon in about 2  weeks' time, to see how he is doing from a blood pressure point of view  and a tolerance  point of view.  In the event that he needs further  therapy, consideration could be given to Norvasc.  I do see that he was  on Clonidine some time ago.  I am not sure what prompted his  discontinuation.  It was very shortly utilized in the late spring of  2005.   I will see him again in 6 months' time.  I should also note that he has  a follow-up appointment with Dr. Posey Rea in September, and once we  make a couple of steps toward his blood pressure, will let Dr. Posey Rea  follow this up with him.     Duke Salvia, MD, [facility]  Electronically Signed    SCK/MedQ  DD: 03/31/2007  DT: 03/31/2007  Job #: [id number]

## 2011-01-30 NOTE — Assessment & Plan Note (Signed)
[facility]                         ELECTROPHYSIOLOGY OFFICE NOTE   Ricky [name], Ricky [name]                     MRN:          [id number]  DATE:10/28/2007                            DOB:          [birth year]    HISTORY OF PRESENT ILLNESS:  [name] has atrial fibrillation that is  now permanent.  He is status post AV junction ablation and pacing, and  he continues to do really well following his two failed PVI's.   He does worry a little bit about device failure, but overall has no  complaints of chest pain or shortness of breath.   Intercurrently, he has had Depakote added to his regime.  He is also on  Coreg, Avapro, potassium and levothyroxine.   PHYSICAL EXAMINATION:  VITAL SIGNS:  Blood pressure was 132/70,  pulse  of 68.  LUNGS:  Clear.  HEART:  Sounds were regular.  EXTREMITIES:  Without edema.   Interrogation of his [employer]. Jude pulse generator demonstrates a no  intrinsic ventricular rhythm.  The atrial impedance was 369 with  ventricular impendence 1009.  Ventricular threshold of one volt of 0.8.  Estimated longevity was 10 years.   IMPRESSION:  1. Complete heart block.  2. Status post AV junction ablation.  3. Atrial fibrillation - permanent.  4. Status post pacer.   [name] is doing very well, we will plan to see him again in six  months time.     Duke Salvia, MD, [facility]  Electronically Signed    SCK/MedQ  DD: 10/28/2007  DT: 10/29/2007  Job #: [id number]

## 2011-01-30 NOTE — Assessment & Plan Note (Signed)
[facility]                         ELECTROPHYSIOLOGY OFFICE NOTE   LEELAN, RAJEWSKI                     MRN:          [id number]  DATE:05/04/2008                            DOB:          Jun 06, 1923    [name] is seen in followup for atrial fibrillation which is now  permanent.  He is status post AV junction ablation and continues to do  really quite well.  His blood pressure is elevated today, but his blood  pressure runs well controlled at home.   MEDICATIONS:  1. Coumadin.  2. Coreg 18.75.  3. Avapro 150 b.i.d.  4. Lasix 20.   PHYSICAL EXAMINATION:  VITAL SIGNS:  The blood pressure was noted about  159/92, the pulse was 75.  LUNGS:  Clear.  HEART:  Sounds were regular.  EXTREMITIES:  Without edema.  GENERAL:  He is in no acute distress.  SKIN:  Warm and dry.   Interrogation of his St. Jude pulse generator demonstrates a  fibrillation wave of 2.4.  There was no intrinsic ventricular rhythm.  The impedance was 11 __________ and threshold was 1 volt at 0.8.  Battery voltage was 276.   IMPRESSION:  1. Atrial fibrillation - permanent.  2. Status post atrioventricular junction ablation with complete heart      block.  3. Status post pacer for the above.  4. Laboratories from Dr. [name] is notable for mildly elevated MCV      and a mildly depressed sodium.  His sodium was normal about a year      ago.   We reviewed his laboratories.  We will plan to check him again in about  2 months' time to see if they have reverted to normal.  In addition, I  will check a folate and B12 level.   We will see him again in 6 months' time.     Duke Salvia, MD, [facility]  Electronically Signed    SCK/MedQ  DD: 05/04/2008  DT: 05/05/2008  Job #: [id number]

## 2011-01-30 NOTE — Letter (Signed)
October 19, 2008    Petra Kuba, M.D.  [address], [address]  [place], [place] [address]   RE:  PINCHAS, REITHER  MRN:  [id number]  /  DOB:  1923-01-22   Dear Loraine Leriche,   Mr. Crist came in for followup for his atrial fibrillation.  He is  status post AV junction ablation and pacing.  He describes some issues  related to his virtual colonoscopy for which apparently invasive  colonoscopy is not required.  As related to his issues on Coumadin,  there should be no major difficulties.  For him stopping Coumadin, I  normally would have him stop it for 4-5 days ahead of a time and then  resume at the night before the colonoscopy so the colonoscopy is done at  nadir but it already restarted to return towards normal.   His other medications include:  1. Levothyroxine.  2. Coreg 18.75 b.i.d.  3. Avapro 150 b.i.d.  4. Depakote.   On examination, his blood pressure today was 140/70.  His pulse was 65.  His lungs were clear.  Heart sounds were regular, and the extremities  were without edema.   Interrogation of his pacemaker demonstrated underlying atrial  fibrillation with no intrinsic ventricular rate.  He was paced at 30.  His impedance was 126 and threshold was 1 V at 0.8.   IMPRESSION:  1. Atrial fibrillation - permanent.  2. Status post atrioventricular junction ablation.  3. Thromboembolic risk factors notable for age and hypertension.   Loraine Leriche, Mr. Broski is doing quite well.  I plan to increase his Coreg to  25 b.i.d. at his next visit, but there should be no significant issues  on holding his Coumadin as discussed above for his colonoscopy.   Please let me know if there is anything I can do to help him.     Sincerely,      Duke Salvia, MD, [facility]  Electronically Signed    SCK/MedQ  DD: 10/19/2008  DT: 10/20/2008  Job #: [id number]

## 2011-02-02 NOTE — H&P (Signed)
NAMEDARRILL, VREELAND NO.:  [id number]   MEDICAL RECORD NO.:  [id number]          PATIENT TYPE:  OIB   LOCATION:  2899                         FACILITY:  [facility]   PHYSICIAN:  Doylene Canning. Ladona Ridgel, MD    DATE OF BIRTH:  [birth year]   DATE OF ADMISSION:  05/17/2006  DATE OF DISCHARGE:                                HISTORY & PHYSICAL   PRIMARY CARE PHYSICIAN:  Georgina Quint. Plotnikov, M.D.   ELECTROPHYSIOLOGISTS:  1. Doylene Canning. Ladona Ridgel, M.D.  2. Duke Salvia, MD, [facility].   ALLERGIES:  1. WELCHOL.  2. LIPITOR.  3. ZETIA.  4. CLONIDINE.  5. INTOLERANCE TO AMIODARONE.   Ricky [name] is an 77 year old male with a history of atrial fib/flutter.  He  had atrial fibrillation catheterization ablation at [facility],  September 2005.  He has had recurrence with that, then he had repeat  catheter ablation, July 02, 2005, and was started on flecainide at that  time.  Subsequently, flecainide was discontinued secondary to lower  extremity edema.  On August 05, 2005, he was started on Tikosyn.  He  spontaneously converted to sinus rhythm on this medication.  He has been  mostly in rhythm since November 2006, although he says he feels that maybe  for short bouts he looses this rhythm.   The patient had a pacemaker implanted on May 05, 1997, for sinus node  dysfunction/syncope, possible atrial fibrillation termination pauses.  He  had generator change, January 2007, implanted was a Engineer, water. June Victory dual  chamber pacemaker.   Sunday, August 26, 20007, he felt that he had gone out of rhythm.  He was  not particularly symptomatic with this.  He was not complaining of chest  pain, shortness of breath, dyspnea, diaphoresis, presyncope, or syncope.  But at time he could feel it bounding around.  He checked via his blood  pressure machine and found that his heart rate was up at rest.  In addition,  this morning he found that his blood pressure was also up.  He had called  the  office by way of fax during the week and was asked to come in NPO for  cardioversion today.  Electrocardiogram shows atrial flutter with a rate of  108.  The patient has no prior history of myocardial infarction,  cerebrovascular accident, GI bleed, deep vein thrombosis, peptic ulcer  disease, seizure, or known coronary artery disease.   MEDICATIONS:  1. Coumadin 5 mg daily.  2. Tikosyn 250 mcg b.i.d.  3. Furosemide 40 mg daily.  4. Levothyroxine 88 mcg daily.  5. Coreg CR 40 mg daily.  6. Avapro 300 mg daily.  7. Enteric coated aspirin 81 mg daily.  8. K-Dur 20 mEq daily.  9. Multivitamin daily.  10.Magnesium daily.   Electrocardiogram, May 17, 2006, shows atrial flutter atypical pattern  with a rate of 110.  On some of the leads on this EKG, it looks as if the  patient is V-pacing.  At other times it looks as if he is inappropriately  pacing in the T wave but this must be an  artifact.   LABORATORY STUDIES:  Complete blood count:  White cells 7.3, 14.8 is the  hemoglobin, hematocrit 43.1, platelets 238.  Pro time 28.5, INR is 2.5, PTT  is 38.  Serum electrolytes:  Sodium 131, potassium 4.5, chloride 96,  carbonate 29, glucose 97, BUN is 16, creatinine 0.9.   IMPRESSION:  1. Recurrent atrial flutter.  2. History of atrial fibrillation/flutter, status post radiofrequency      catheter ablation x2.      a.     Failed flecainide secondary to lower extremity edema, started       Tikosyn August 05, 2005, with spontaneous conversion to sinus       rhythm.  3. Pacemaker implanted for syncope.      a.     Atrial fibrillation termination pauses, August 1998.      b.     Generator change, January 2007.   PLAN:  For DC cardioversion today with pacer interrogation.     ______________________________  Maple Mirza, PA    ______________________________  Doylene Canning. Ladona Ridgel, MD    GM/MEDQ  D:  05/17/2006  T:  05/17/2006  Job:  [id number]

## 2011-02-02 NOTE — Assessment & Plan Note (Signed)
[facility]                         ELECTROPHYSIOLOGY OFFICE NOTE   RAMERE, DOWNS                     MRN:          [id number]  DATE:09/20/2006                            DOB:          11/19/1922    Ricky [name] comes in today because he is continuing to have problems  with palpitations and tachyarrhythmias.  He was recently seen in  [place] and he declined a third fibrillation ablation attempt.  Because of his tachyarrhythmias, he comes in today to discuss other  options.  I raised with him the possibility of AV ablation.  He has an  indwelling St. Jude pacemaker currently.  We discussed the potential  benefits as well as potential risks and the potential role of CRT  upgrade and that that might be necessary in somewhere between 5 and 15%  of subjects.  We discussed the potential role of doing it  prophylactically but I told him that 85% of patients typically are  better or much better in the setting of AV ablation.  He would like to  proceed with AV ablation.  We will plan at this point not to upgrade his  device.     Duke Salvia, MD, [facility]  Electronically Signed    SCK/MedQ  DD: 09/20/2006  DT: 09/20/2006  Job #: [id number]

## 2011-02-02 NOTE — Op Note (Signed)
[name], [name]              ACCOUNT NO.:  [id number]   MEDICAL RECORD NO.:  [id number]          PATIENT TYPE:  OIB   LOCATION:  2899                         FACILITY:  [facility]   PHYSICIAN:  Duke Salvia, M.D.  DATE OF BIRTH:  Nov 14, 1922   DATE OF PROCEDURE:  12/26/2004  DATE OF DISCHARGE:                                 OPERATIVE REPORT   PREOPERATIVE DIAGNOSES:  1.  Previously-implanted pacemaker.  2.  Previous atrial fibrillation ablation.  3.  Atrial flutter that appeared to be typical, now in atrial fibrillation.   POSTOPERATIVE DIAGNOSIS:  Persistent cavo-tricuspid isthmus block.   PROCEDURE:  Direct current cardioversion and left atrial and right atrial  recording and pacing.   Following the obtaining of informed consent, the patient was brought to the  electrophysiology laboratory and placed on the fluoroscopic table in supine  position.  After routine prep and drape, cardiac catheterization was  performed via the right femoral vein.  At the end of the procedure, the  catheter was removed, hemostasis was obtained, and the patient was  transferred to the holding area in stable condition.   Prior to the catheterization, however, the patient was cardioverted,  receiving a 200-joule shock delivered synchronously, restoring sinus rhythm.  Following this, the catheter was placed into the right atrium and moved into  the coronary sinus.  At this point, coronary sinus pacing was undertaken,  which demonstrated conduction block across the cavo-tricuspid isthmus.  At  this point the procedure was terminated.  It was elected not to try to  induce an atrial flutter because if was likely left atrial, and the patient  would be poised to follow up with Dr. Sampson Goon for a repeat fibrillation  ablation.   The patient tolerated the procedure well without apparent complications.      SCK/MEDQ  D:  12/26/2004  T:  12/26/2004  Job:  [id number]   cc:   Clydie Braun, M.D.  [facility]  [facility]   Electrophysiology Laboratory   [employer]

## 2011-02-02 NOTE — H&P (Signed)
Ricky [name], Ricky [name] NO.:  [id number]   MEDICAL RECORD NO.:  [id number]          PATIENT TYPE:  OIB   LOCATION:  2899                         FACILITY:  [facility]   PHYSICIAN:  Ricky Salvia, MD, FACCDATE OF BIRTH:  1923/09/12   DATE OF ADMISSION:  09/12/2006  DATE OF DISCHARGE:                              HISTORY & PHYSICAL   ELECTROPHYSIOLOGIST:  Ricky Salvia, MD, [facility].   PRIMARY CAREGIVER.Ricky [name], M.D.   HISTORY OF PRESENT ILLNESS:  Ricky [name] is an 77 year old male with a  history of atrial arrhythmias.  He is on Coumadin.  He presents for  cardioversion today for relief of palpitations secondary to recurrent  atrial flutter.   He has had catheter ablation x2 for atrial fibrillation at [facility]. He  sought consultation for recurrent atrial fibrillation at [facility] in the fall of 2007 but decided against pursuing this when he  learned that they were not yet ready to go forward with an experimental  technique that he had read about. His original ablation for atrial  fibrillation was September 2005.  He did have recurrence. The second  ablation was in October 2006 and this was done on flecainide. This drug  was subsequently discontinued secondary to lower extremity edema.   The patient was started on Tikosyn November 2006 with spontaneous  conversion. He had recurrence August 2007 and underwent cardioversion  May 17, 2006.  He has done well since that time until Friday,  December 14. He started having palpitations and increased heart rate.  Presents for repeat DCCV on Tikosyn. He has allergies to Welchol, Zetia,  Clonidine, Lipitor, Cardizem. Electrocardiogram of August 23, 1924 2007  shows atrial flutter, rate of 96, the QT is 374, the QTC is 472.   ALLERGIES:  WELCHOL, ZETIA, CLONIDINE, LIPITOR, CARDIZEM.   MEDICATIONS:  1. Coumadin.  2. Tikosyn 250 mcg twice daily.  3. Lasix 40 mg daily.  4. Levothyroxine 88 mcg  daily.  5. Coreg 25 mg twice daily.  6. K-Dur 20 mEq daily.  7. Toprol XL 50 mg daily.   PAST MEDICAL HISTORY:  1. Recurrent atrial flutter for DCCV today.  2. Pacemaker implanted for syncope.      a.     Atrial fibrillation in the past with post termination       pauses, implanted August 1998.      b.     Generator change for pacemaker January 2007.  3. History of atrial fibrillation on chronic Coumadin.      a.     Radiofrequency catheter ablation at [facility] September 2005.      b.     Radiofrequency catheter ablation [facility] October 2006 on       flecainide.      c.     Tikosyn started November 2006 with spontaneous conversion to       sinus rhythm.   PHYSICAL EXAM:  VITAL SIGNS:  Temperature 97.0, blood pressure 125/79,  pulse is 66, respirations are 18.  GENERAL:  He is alert and oriented x3.  He feels palpitations for  last 2  weeks.  NECK:  Supple without carotid bruits.  LUNGS:  Clear to auscultation bilaterally.  HEART: Regular rate and rhythm with telemetry strip showing atrial  flutter.  ABDOMEN: Soft, nondistended.  Bowel sounds are present.  EXTREMITIES:  Show no edema.   PLAN:  DCCV today, possible increase of Tikosyn if recurrence.      Ricky [name], [place]      Ricky Salvia, MD, [facility]  Electronically Signed    GM/MEDQ  D:  09/12/2006  T:  09/12/2006  Job:  [id number]

## 2011-02-02 NOTE — Assessment & Plan Note (Signed)
[facility]                           ELECTROPHYSIOLOGY OFFICE NOTE   Ricky [name], Ricky [name]                     MRN:          [id number]  DATE:08/05/2006                            DOB:          07-02-1923    Ricky [name] returns having gone to see Dr. Dory Peru up in [place]. They had a  long discussion regarding RF catheter ablation and at the end of the day  decided not to proceed with a third procedure.   He is currently taking Tykosin, Toprol-XL and Coreg.  He is feeling pretty  good the way he is.  His other medications include:  1. Avapro 300.  2. Levothyroxine 88.  3. Lasix 40.  4. K-Dur.   PHYSICAL EXAMINATION:  VITAL SIGNS:  Blood pressure today was 142/70, pulse  65.  LUNGS:  Clear.  CARDIAC:  Heart sounds were regular.  EXTREMITIES:  Without edema.   Interrogation of his pacemaker demonstrated that he was as in atrial  fibrillation about 5% of the time and that in that, his ventricular rate is  fast so that about 50% of the time or so, maybe 40% he was faster than about  100 beats per minute.   IMPRESSION:  1. Paroxysmal atrial fibrillation with a moderate right ventricular      response.  2. Status post two prior PVIs  with improved control now on Tykosin with      some rapid rates.  3. Left ventricular dysfunction, mild, with ejection fraction of 35-40% a      year ago.  4. Treated hypothyroidism.  5. Ricky [name] will be treated medically now.  Will have to work on      assuring adequacy of rate control.  In the interim will increase his      Coreg from 18.75 to 25 twice daily although I suspect that this will      have an inadequate effect on his rate control.  We may need to increase      his Toprol-XL and will consider AV junction ablation.  I will see him      again in four months time.     Ricky Salvia, MD, [facility]  Electronically Signed    SCK/MedQ  DD: 08/05/2006  DT: 08/05/2006  Job #: [id number]

## 2011-02-02 NOTE — Op Note (Signed)
NAMEMYKAI, WENDORF NO.:  [id number]   MEDICAL RECORD NO.:  [id number]          PATIENT TYPE:  OIB   LOCATION:  2029                         FACILITY:  [facility]   PHYSICIAN:  Duke Salvia, MD, FACCDATE OF BIRTH:  Nov 14, 1922   DATE OF PROCEDURE:  09/25/2006  DATE OF DISCHARGE:                               OPERATIVE REPORT   PREOPERATIVE DIAGNOSIS:  Refractory atrial arrhythmias despite atrial  fibrillation ablation x2.   POSTOPERATIVE DIAGNOSIS:  Refractory atrial arrhythmias despite atrial  fibrillation ablation x2.   PROCEDURE:  AV junction ablation with His electrogram measurement.   Following obtaining informed consent, the patient was brought to the  electrophysiology laboratory and placed on the fluoroscopic table in the  supine position.  After routine prep and drape, cardiac catheterization  was performed with local anesthesia and conscious sedation.  Noninvasive  blood pressure monitoring and transcutaneous oxygen saturation  monitoring were performed continuously throughout the procedure.  Following the procedure, the catheter was removed, hemostasis was  obtained, and the patient was transferred to the floor in stable  condition.   CATHETERS:  7-French 5-mm deflectable tip catheter was inserted via the  right femoral vein initially via SR0 sheath and then an SL2 sheath to  mapping sites in the anterior septal space.   Following the procedure, the catheter was removed as noted.   Following placement of the catheter, no pacing maneuvers were  undertaken.   Intracardiac intervals demonstrated an AA interval of 285 milliseconds  and HV interval of 56 milliseconds.  RF energy was delivered at the site  of a His potential.  Extensive mapping had failed to demonstrate any  prominent His potentials; I referred that this was related to the  patient's prior ablation procedures.   In any case, we initially generated a right bundle branch block and  then  complete heart block.  There was no intrinsic ventricular rhythm.   The patient's device was reprogrammed to a VVI rate of 90 beats per  minute.  The patient tolerated the procedure well.      Duke Salvia, MD, [facility]  Electronically Signed     SCK/MEDQ  D:  09/25/2006  T:  09/25/2006  Job:  [id number]

## 2011-02-02 NOTE — Assessment & Plan Note (Signed)
[name]                           ELECTROPHYSIOLOGY OFFICE NOTE   [name], [name]                     MRN:          [id number]  DATE:06/10/2006                            DOB:          1923/05/14    [name] is seen following cardioversion.  He has anticipated atrial  fibrillation ablation at the [facility] on November 13 or 14th and we  will see him again thereafter.  His exam was unrevealing today.                                   Duke Salvia, MD, [facility]   SCK/MedQ  DD:  06/10/2006  DT:  06/12/2006  Job #:  [id number]

## 2011-02-02 NOTE — Discharge Summary (Signed)
[name], [name] NO.:  [id number]   MEDICAL RECORD NO.:  [id number]          PATIENT TYPE:  OIB   LOCATION:  2029                         FACILITY:  [facility]   PHYSICIAN:  Duke Salvia, MD, FACCDATE OF BIRTH:  02-15-1923   DATE OF ADMISSION:  09/25/2006  DATE OF DISCHARGE:  09/26/2006                               DISCHARGE SUMMARY   PRIMARY CARDIOLOGIST:  Dr. Sherryl Manges.   PRIMARY CARE Sharina Petre:  Dr. Georgina Quint. Plotnikov.   PROCEDURES PERFORMED DURING HOSPITALIZATION:  AV junction ablation with  His electrogram measurements per Dr. Graciela Husbands on September 25, 2006.   PRIMARY DIAGNOSIS:  Tachyarrhythmias in the setting of atrial  fibrillation.   SECONDARY DIAGNOSES:  1. Indwelling St. Jude's pacemaker secondary to syncope.      a.     Atrial fibrillation in the past with post-termination pauses       implanted in August 1998.      b.     Generator change for pacemaker January 2007.  2. History of atrial fibrillation on chronic Coumadin.      a.     Radiofrequency catheter ablation at [facility] September 2005.      b.     Radiofrequency catheter ablation [facility] October 2006 on       Flecainide.      c.     Tikosyn started November 2006 with spontaneous conversion to       sinus rhythm.   HISTORY OF PRESENT ILLNESS:  This is an 77 year old Caucasian male with  a history of atrial arrhythmias, continues on Coumadin who presents for  cardioversion today for release of palpitations secondary to recurrent  atrial flutter.  The patient was seen on September 20, 2006 by Dr. Graciela Husbands in  his office, and the patient agreed to have ablation per Dr. Graciela Husbands at  that visit.   HOSPITAL COURSE:  The patient was admitted, and procedure was performed  as described above.  The patient tolerated the procedure well without  any complications.  The patient's device was reprogrammed to a VVI with  a rate of 90 beats per minute.  There were no other tests or procedures  completed  during the hospitalization.  The patient was seen and examined  by Dr. Sherryl Manges on September 26, 2006, and found to be stable for  discharge.   LABORATORY DATA ON DISCHARGE:  Hemoglobin 14.5, hematocrit 41.9 white  blood cells 7.6, platelets 282; sodium 140, potassium 4.7, chloride 102,  CO2 33, glucose 95, BUN 16, creatinine 0.8, PT 21.0, INR 2.7.   PHYSICAL EXAMINATION:  The patient was found to be in sinus rhythm,  regular rate and rhythm.  LUNGS:  Clear.  There was no edema seen.   DISCHARGE MEDICATIONS:  1. Coumadin per Coumadin clinic.  2. Tikosyn 250 mg b.i.d.  3. Furosemide 40 mg daily.  4. Levothyroxine 88 mcg daily.  5. Coreg 18.75 mg p.o. b.i.d.  6. Avapro 300 mg p.o. daily.  7. Potassium 20 mEq p.o. daily.  The patient is told to stop Toprol as      taken  at home.   FOLLOWUP APPOINTMENT AND PLANS:  1. The patient is scheduled in Device Clinic on January 16 at 1:30      p.m. through Dr. Lubertha Basque office.  2. On October 09, 2006 at 9 a.m., the patient will also follow up in      the device clinic for pacemaker check.  3. October 19, 2006 at 9:30, he will have a followup appointment with      Dr. Graciela Husbands as an office visit.  4. The patient is told to take it easy for three days.  5. The patient has been advised not to take Toprol as he was taking it      at home.   Time spent with the patient to include physician time 35 minutes.      Bettey Mare. Lyman Bishop, NP      Duke Salvia, MD, [facility]  Electronically Signed    KML/MEDQ  D:  09/26/2006  T:  09/26/2006  Job:  [id number]

## 2011-02-02 NOTE — Op Note (Signed)
NAME:  Ricky [name], Ricky [name]                        ACCOUNT NO.:  [id number]   MEDICAL RECORD NO.:  [id number]                   PATIENT TYPE:  AMB   LOCATION:  ENDO                                 FACILITY:  [facility]   PHYSICIAN:  Petra Kuba, M.D.                 DATE OF BIRTH:  04-19-1923   DATE OF PROCEDURE:  12/23/2002  DATE OF DISCHARGE:                                 OPERATIVE REPORT   PROCEDURE PERFORMED:  Colonoscopy.   ENDOSCOPIST:  Petra Kuba, M.D.   INDICATIONS FOR PROCEDURE:  Screening and constipation.  Consent was signed  after the risks, benefits, methods and options were thoroughly discussed in  the office.   MEDICINES USED:  Demerol 50 mg, Versed 5 mg.   DESCRIPTION OF PROCEDURE:  Rectal inspection was pertinent for external  hemorrhoids, small.  Digital exam was negative.  A video pediatric  adjustable colonoscope was inserted and easily advanced around the colon to  the cecum.  Unfortunately at that juncture, we clogged the scope with stool.  We had to wash and suction and try to clear to suction.  The cecum was  identified by the appendiceal orifice and the ileocecal valve although a  small lesion at the cecal bulb could have been missed.  Once we unclogged  the scope, we were able to slowly withdraw.  The prep was fairly adequate.  It did require lots of washing and suctioning for adequate visualization.  On slow withdrawal through the colon, other than a midtransverse small polyp  which was biopsied times two and a rare left-sided diverticula.  No  additional findings were seen.  Once back in the rectum, anorectal  pullthrough on retroflexion was actually interesting in that he might have a  rectocele but no other significant abnormalities.  Possibly some small  hemorrhoids.  Scope was straightened, readvanced a short ways up the left  side of the colon.  Air was suctioned, scope removed.  The patient tolerated  the procedure well.  There was no immediate  obvious complication.   ENDOSCOPIC DIAGNOSES:  1. Questionable small internal and external hemorrhoids.  Questionable     rectocele.  2. Occasional rare left-sided diverticula.  3. Midtransverse small polyp cold biopsied times two.  4. Otherwise within normal limits to the cecum.    PLAN:  Happy to see him back if the constipation worsens.  Consider even  surgical consultation for possible rectocele if things become significantly  worse.  Otherwise if doing well medically, consider repeat colon screening  in five years but will restart Coumadin today.  Happy to see back as above.                                               Petra Kuba, M.D.  MEM/MEDQ  D:  12/23/2002  T:  12/24/2002  Job:  [id number]   cc:   Windy Fast L. Ovidio Hanger, M.D.  [address]  [place]  [place] [address]  Fax: [phone]   Darlin Priestly, M.D.  [phone], [address]  [place]  [place] [address]  Fax: [phone]

## 2011-02-02 NOTE — Assessment & Plan Note (Signed)
[facility]                         ELECTROPHYSIOLOGY OFFICE NOTE   ENOS, MUHL                       MRN:          [id number]  DATE:10/02/2006                            DOB:          1923-02-24    [name] returns today for follow-up.  He is status post AV node  ablation.  He has a history of recurrent and chronic atrial arrhythmias  including atrial tachycardia, atrial flutter, and atrial fibrillation.  He is on __________ despite this.  He returns today for follow-up and to  have his pacemaker rate turned from 90 beats per minute to 80 beats per  minute.  He is status post AV node ablation.  He denies chest pain or  shortness of breath.  He has chronic mild peripheral edema.   PHYSICAL EXAMINATION:  GENERAL:  He is a pleasant, elderly man in no  acute distress.  VITAL SIGNS:  Blood pressure today is 143/91, pulse was 90 and regular,  respirations were 18.  Weight was not recorded.  CARDIOVASCULAR:  Regular rate and rhythm with normal S1 and S2.  LUNGS:  Clear bilaterally to auscultation.  There were no wheezes,  rales, or rhonchi.  EXTREMITIES:  Demonstrated 1+ peripheral edema bilaterally.   His pacemaker was interrogated.  The patient was found to be in atrial  tachycardia/atrial flutter with ventricular pacing.  He had no  underlying escape rhythm.  His rate was decreased from 90 to 80 beats  per minute.  Outputs were left at 3-1/2.  We will plan to see him back  in the office in one week for addition turndown of his ventricular rate.  He will then follow up with Dr. Graciela Husbands after this.     Doylene Canning. Ladona Ridgel, MD  Electronically Signed    GWT/MedQ  DD: 10/02/2006  DT: 10/02/2006  Job #: [id number]

## 2011-02-02 NOTE — Assessment & Plan Note (Signed)
[facility]                         ELECTROPHYSIOLOGY OFFICE NOTE   [name], Ricky                     MRN:          [id number]  DATE:10/09/2006                            DOB:          [birth year]    Ricky [name] was seen today in the clinic on October 09, 2006 for a  followup of his St. Jude model [phone].   Date of implant was October 16, 2005 for sinus node dysfunction with an  AV node ablation done on January 4 of this year.  On interrogation of  his device today, his battery voltage is 2.78.  He is now pacemaker-  dependent.  He has had 410 mode switch episodes totaling 80% of the  time, and his rate is at 80.  I did activate his rate response today,  and he will come back again in two weeks time for a visit with Dr.  Graciela Husbands.      Altha Harm, LPN  Electronically Signed      Duke Salvia, MD, [facility]  Electronically Signed   PO/MedQ  DD: 10/09/2006  DT: 10/09/2006  Job #: [id number]

## 2011-02-02 NOTE — Op Note (Signed)
NAMEHANSEN, Ricky [name]              ACCOUNT NO.:  [id number]   MEDICAL RECORD NO.:  [id number]          PATIENT TYPE:  OIB   LOCATION:  2899                         FACILITY[facility]  [facility]   PHYSICIAN:  Bevelyn Buckles. Bensimhon, MDDATE OF BIRTH:  Aug 07, 1923   DATE OF PROCEDURE:  DATE OF DISCHARGE:                               OPERATIVE REPORT   Direct current cardioversion procedure.   PATIENT IDENTIFICATION:  The patient is an 77 year-old male with  recurrent atrial flutter status post multiple ablations and currently on  Tikosyn.  He was evaluated by Dr. Berton Mount and referred for  cardioversion today due to symptomatic palpitations.   SEDATION:  Per Anesthesia, Dr. Ivin Booty, 225 mg of pentothal.   PROCEDURE NOTE:  After proper sedation, the patient underwent  cardioversion with synchronized 150 joule biphasic shock.  There was a  brief conversion to atrial pacing, however he quickly went back to  atrial flutter within a matter of seconds.  While still sedated, the  patient received a second synchronized shock at 200 joules biphasic.  He  once again converted to atrial pacing which he has maintained currently.   His device is now being evaluated by the [employer]. Jude representative to make  sure his parameters are still adequately set.  I suspect he will revert  to atrial flutter.  Possible increase Tikosyn per Dr. Graciela Husbands.   There were no apparent complications.  Final EKG is pending.      Bevelyn Buckles. Bensimhon, MD  Electronically Signed     DRB/MEDQ  D:  09/12/2006  T:  09/12/2006  Job:  [id number]

## 2011-02-02 NOTE — Assessment & Plan Note (Signed)
[facility]                         ELECTROPHYSIOLOGY OFFICE NOTE   YORDIN, RHODA                     MRN:          [id number]  DATE:01/06/2007                            DOB:          09-08-1923    Mr. Mahon comes in stating he does not have quite enough energy.  Jana Half, from [place], undertook a series of reprogrammings of his  right sensor and finally got what he thought was an ideal program.  We  have reprogrammed such.  We will plan to see him again in 3 weeks' time  to let us know how he is doing.  If he is feeling fine, will plan to see  him in 3 months.     Duke Salvia, MD, [facility]  Electronically Signed    SCK/MedQ  DD: 01/06/2007  DT: 01/06/2007  Job #: [id number]

## 2011-02-02 NOTE — Op Note (Signed)
Ricky [name], Ricky [name] NO.:  [id number]   MEDICAL RECORD NO.:  [id number]          PATIENT TYPE:  OIB   LOCATION:  2899                         FACILITY:  [facility]   PHYSICIAN:  Doylene Canning. Ladona Ridgel, MD    DATE OF BIRTH:  [birth year]   DATE OF PROCEDURE:  05/17/2006  DATE OF DISCHARGE:  05/17/2006                                 OPERATIVE REPORT   PROCEDURE PERFORMED:  DC cardioversion.   INDICATION:  Symptomatic atrial fibrillation.   INTRODUCTION:  The patient is a 77 year old male with a history of  persistent and symptomatic AFib, who has been placed on Tikosyn but has not  returned to sinus rhythm.  He is now referred for DC cardioversion.   PROCEDURE:  After informed consent was obtained, the patient was prepped in  the usual manner.  He was given 200 mg of sodium penthothal under the  direction of the anesthesia service.  He was subsequently DC cardioverted  with 200 joules of synchronized biphasic energy, restoring sinus rhythm.  The patient did have brief, nonsustained atrial tachycardia following the  procedure but did not return to AFib, maintaining sinus rhythm with atrial  pacing and ventricular sensing.  His AFib's pressure algorithm was turned  off for fear that it would be initially pro-arrhythmic.   COMPLICATIONS:  There were no immediate procedure complications.   RESULTS:  This demonstrates successful DC cardioversion in a patient with  symptomatic atrial fibrillation.           ______________________________  Doylene Canning. Ladona Ridgel, MD     GWT/MEDQ  D:  05/17/2006  T:  05/17/2006  Job:  [id number]   cc:   Duke Salvia, MD, [facility]

## 2011-02-02 NOTE — Assessment & Plan Note (Signed)
[facility]                         ELECTROPHYSIOLOGY OFFICE NOTE   ONNIE, HATCHEL                     MRN:          [id number]  DATE:10/23/2006                            DOB:          [birth year]    DATE OF VISIT:  October 23, 2006   REASON FOR VISIT:  Mr. Thune is seen today following AV junction  ablation.  He is feeling much better.  There was initially over the  first couple of days some fluttering's.  This has abated.   Now that he his rate response has been activated, he is walking further  and feeling better than he has in a long time.   MEDICATIONS:  Review of his medications reveals the intercurrent  discontinuation of Toprol. He remains on Tikosyn, which we will need to  review, Coreg, Avapro and Lasix as well as thyroid.   PHYSICAL EXAMINATION:  VITAL SIGNS:  Blood pressure 123/71, pulse 90.  LUNGS:  Clear.  HEART:  Sounds are regular.  EXTREMITIES:  Without edema.   CLINICAL DATA:  Interrogation of his device demonstrated that he was in  atrial fibrillation and had been essentially all of the time since he  was last programmed a couple of weeks ago.  His heart rate excursion is  interesting in that there is a second blip at his upper tracking rate.  This suggests, along with a mode switch count of 96, that there is some  intermittent under-sensing and he is tracking too high.   IMPRESSION:  1. Atrial fibrillation, now persistent.  2. Tikosyn therapy for #1.  Will need to consider discontinuation.  3. Status post AV ablation following two failed PVI's.  4. Previous implanted pacemaker for bradycardia, now device-dependent      with abnormal heart rate excursion.   PLAN:  Will plan to see him again in about four weeks.  I need to look  at the heart rate excursion and we may have to reprogram his device to  DDIR as well as considering discontinuing the Tikosyn.   We will also check a B12 and Folate, as his MCV on his  last blood test  was abnormal.     Duke Salvia, MD, [facility]  Electronically Signed   SCK/MedQ  DD: 10/23/2006  DT: 10/23/2006  Job #: [id number]

## 2011-02-02 NOTE — H&P (Signed)
NAMEMARISSA, LOWREY NO.:  [id number]   MEDICAL RECORD NO.:  [id number]          PATIENT TYPE:  OIB   LOCATION:  2899                         FACILITY:  [facility]   PHYSICIAN:  Duke Salvia, M.D.  DATE OF BIRTH:  [birth year]   DATE OF ADMISSION:  12/26/2004  DATE OF DISCHARGE:                                HISTORY & PHYSICAL   ELECTROPHYSIOLOGIST:  Duke Salvia, M.D.   CARDIOLOGIST:  Carole Binning, M.D.   PRIMARY CARE PHYSICIAN:  Georgina Quint. Plotnikov, M.D.  Dr. Violeta Gelinas at the [facility] in [place] has provided  an atrial fibrillation ablation.   ALLERGIES:  LIPITOR (causes myopathy), WELCHOL and ZETIA (which he is  intolerant to these).   HISTORY:  Mr. Clapper is a 77 year old male with a history of atrial  fibrillation, status post ablation of this in June 12, 2004.  This was  done at [facility]. The patient was no amiodarone at the  time.  The patient had done well after his ablation, but presented November 12, 2004 to the [facility] emergency room with palpitations,  shortness of breath and increased blood pressure.  An electrocardiogram  showed atrial flutter with variable conduction, with atypical features and  elevated waves in V1 and elevations in the inferior leads.  The patient was  given Cardizem 60 mg orally and the heart rate settled down.   The patient then went home, but presented to Dr. Odessa Fleming office on November 20, 2004.  Dr. Graciela Husbands saw the electrocardiograms, analyzed them as atrial flutter  and has planned atrial flutter ablation -- thinking that amiodarone had  concealed a __________ tricuspid pathway.  The patient was scheduled for  this procedure five weeks ago, but was cancelled secondary to subtherapeutic  INR.  INR today is 2.0 and INR for the last four months all 2.0 or greater.   The patient is for ablation today of atrial flutter.  Possibly discontinue  cardioversion if  the flutter is in the left atrium.  The patient is unable  to qualify for Cool-Path secondary to prior ablation procedure.  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   MEDICATIONS:  1.  Aspirin 81 mg q.d.  2.  Vitamin C 500 mg q.d.  3.  Multivitamin q.d.  4.  Coumadin per [facility].  5.  Folic acid 1 mg q.d.  6.  Levoxyl 100 mcg q.d.  7.  Coreg 18.75 mg b.i.d.  8.  __________ 300 mg q.d.  9.  Vitamin B complex q.d.  10. Maxzide 75/50 mg q.d.   SOCIAL HISTORY:  The patient lives alone.  He is a retired Geophysical data processor.  He does not smoke.  He does not partake of alcoholic beverages.  No recreational drugs.   REVIEW OF SYSTEMS:  He is not having any exertional discomforts.  No chest  pain.  The patient does not have any fevers, chills, night sweats, weight  changes,  no postural __________  or adenopathy.  HEENT:  No nasal discharge.  No epistaxis.  No voice changes or hoarseness.  No vertigo.  No photophobia.  INTEGUMENT:  No ulcerations or rashes.  CARDIOPULMONARY:  The patient does  not have chest pain, shortness of breath, dyspnea on exertion; no orthopnea,  no paroxysmal nocturnal dyspnea.  Mild edema of lower extremities when he is  having palpitations.  No syncope or presyncope.  No claudication symptoms.  The patient does have palpitations; however when his heart is racing.  UROGENITAL: The patient has urge and frequency.  I am going to ask for a  Foley to be placed during the procedure.  NEUROLOGIC/PSYCHIATRIC:  The  patient has weakness.  He is able to ambulate without shortness of breath,  but says his legs give out, they just get weak.  There is no cramping  involved as one would have with claudication symptoms.  He has no depression  or anxiety.  GI:  No chronic reflux.  No history of peptic ulcer disease or  GI  bleeding.  ENDOCRINE:  No history of diabetes or thyroid disease.  MUSCULOSKELETAL:  No arthralgias, no joint swelling or deformities. All  other systems are negative.   PHYSICAL EXAMINATION:  VITAL SIGNS:  Temperature 96.5, pulse 79, blood  pressure 147/79, respirations 20.  GENERAL:  The patient is alert and oriented x3; no acute distress.  HEENT:  Normocephalic and atraumatic.  EYES:  Pupils equally round and  reactive to the light.  Extraocular movements are intact.  Sclerae are  clear. ENT:  Nose without discharge.  The patient does not wear dentures.  NECK:  Supple with no carotid bruits auscultated.  There is no jugular  venous distention, no thyromegaly and no cervical lymphadenopathy.  HEART:  Regular rate and rhythm without murmur, rubs or gallop.  LUNGS:  Clear to auscultation and percussion bilaterally.  ABDOMEN:  Soft, nondistended; bowel sounds are present.  No  hepatosplenomegaly.  No rebound or guarding.  Abdominal aorta is not  pulsatile.  EXTREMITIES:  Show no evidence of clubbing, cyanosis or edema.  SKIN:  No rashes, lesions or petechiae.  MUSCULOSKELETAL:  No joint deformity, effusions, kyphosis or scoliosis.  NEUROLOGIC:  Cranial nerves II-XII grossly intact.  Motor control of both  upper and lower extremities is acceptable.   ELECTROCARDIOGRAM:  February 2006 showed atrial flutter at a rate of 127,  with atypical features.   LABORATORY STUDIES:  (Today)  PT 19.2, INR 2.0.  Complete Blood Count:  White cells 5.1, hemoglobin 12.1, hematocrit 37.1, platelets 219.   IMPRESSION:  1.  Admitted for atrial flutter ablation.  The patient has symptoms of      palpitation. If the flutter is in the left atrium, the patient will have      a DC cardioversion.  2.  Coumadin is therapeutic today and for the last four weeks.  3.  History of atrial fibrillation, status post ablation at [facility] in [place], [place] on June 12, 2004.  4.  Hospital visit  November 12, 2004 with palpitations and elevated blood      pressure; heart rate controlled with oral Cardizem 60 mg.  The patient      went home.  5.  Hypertension.  6.  Cardiomyopathy, with ejection fraction 35-40%.  Catheterization in 1997      with ejection fraction of 63% on echocardiogram of August 2005 at      [place].  7.  Nonobstructive coronary  artery disease, per catheterization 1997.  8.  Status post permanent pacemaker (St. Jude) for post atrial fibrillation      termination pauses.   PLAN:  Atrial flutter ablation, or if in left atrium discontinue  Cardioversion (Dr. Graciela Husbands).   FAMILY HISTORY:  Mother died at age 66 of cervical cancer.  Father died at  age 42 of a blood virus.  He is one of nine siblings.  One sister has  valve replaced.  One sister is living at 75, with cerebrovascular accident.  One brother died at age 65 of unknown cause.  One sister died at age 54 of  cancer.  His other siblings are all alive and well without coronary artery  disease.      GM/MEDQ  D:  12/26/2004  T:  12/26/2004  Job:  [id number]   cc:   Carole Binning, M.D. [facility]   Aleksei V. Plotnikov, M.D. [facility]

## 2011-02-02 NOTE — Assessment & Plan Note (Signed)
[facility]                         ELECTROPHYSIOLOGY OFFICE NOTE   ANDERSSON, LARRABEE                     MRN:          [id number]  DATE:11/28/2006                            DOB:          [birth year]    [name] comes in following AV junction ablation with prior failed  PVI.  He is doing really quite well. He thinks his energy level is a  little bit better with his pacer programmed at 65 versus 70 so we have  changed that.  We did have a discussion regarding the fact that he is  now device dependent as related to rate response and their may be some  adjustments that will need to be undertaken with his sensor to optimize  performance.   He is in atrial fibrillation. We will plan to discontinue his Tikosyn.   His potassium's have been running at the higher level of normal. As we  decrease his Lasix from 40 to 20 to change his potassium to every other  day and I will plan to check a BMET in about 3 weeks.   I will see him again in 6 week's time to reassess exercise tolerance.   I should not that his examination today was notable for a blood pressure  of 149/82 with a pulse of 69. His lungs were clear. Heart sounds were  regular and extremities were without edema.   I should also note that we will consider increasing his Coreg at his  next visit from 18.75 to 25 twice daily.     Duke Salvia, MD, [facility]  Electronically Signed    SCK/MedQ  DD: 11/28/2006  DT: 11/29/2006  Job #: [id number]   cc:   Clydie Braun, M.D.

## 2011-02-02 NOTE — Assessment & Plan Note (Signed)
[facility]                         ELECTROPHYSIOLOGY OFFICE NOTE   SHION, BLUESTEIN                     MRN:          [id number]  DATE:12/18/2006                            DOB:          Mar 16, 1923    Mr. Stene was seen today in the clinic on December 18, 2006 at the  patient's request. He had been in on March 13 and we reprogrammed his  sensor to allow for more effective rate response and it did not seem to  be a comfortable response for him. On interrogation of his device today,  his battery voltage is 2.79. He is programmed DDIR at 65 and 125 rates.  I changed his sensor threshold back to -0.5 and his reaction time is  medium. Originally I put it on fast. I did a walk with him and that was  too aggressive, so I put his reaction time back to medium and that  seemed to be a little bit more comfortable. He has a return office visit  on the 21st of this month with Dr. Graciela Husbands and will reevaluate his sensor  at that time.      Altha Harm, LPN  Electronically Signed      Duke Salvia, MD, [facility]  Electronically Signed   PO/MedQ  DD: 12/18/2006  DT: 12/18/2006  Job #: [id number]

## 2011-02-02 NOTE — Op Note (Signed)
NAMEDERICO, MITTON NO.:  [id number]   MEDICAL RECORD NO.:  [id number]          PATIENT TYPE:  OIB   LOCATION:  2899                         FACILITY:  [facility]   PHYSICIAN:  Duke Salvia, M.D.  DATE OF BIRTH:  02/22/1923   DATE OF PROCEDURE:  10/16/2005  DATE OF DISCHARGE:  10/16/2005                                 OPERATIVE REPORT   PREOPERATIVE DIAGNOSIS:  Sinus node dysfunction with a previously implanted  pacemaker now at end of life.   POSTOPERATIVE DIAGNOSIS:  Sinus node dysfunction with a previously implanted  pacemaker now at end of life.   PROCEDURE:  Explantation of a previously implanted device.   Following obtaining informed consent, the patient was brought to the  catheterization laboratory and placed on the fluoroscopic table in the  supine position.  After routine prep and drape, and an x-ray demonstrating  the orientation of the can, lidocaine was infiltrated along the line of the  previous incision and carried down to the layer of the device pocket using  sharp dissection and electrocautery.  The pocket was opened.  The leads were  freed up from their scar tissue and the previously implanted device was  explanted.  The atrial lead was a St. Jude 1388TC, serial number [id number],  and the ventricular lead was a 1346TCE, serial number [id number].  The bipolar P  wave was 5 millivolts with a pacing impedance of 360 ohms and a threshold  0.6 volts at 0.5  milliseconds, and current threshold 0.2 MA.  The bipolar R  wave was 16.5 millivolts with a pacing impedance of 804 ohms and a threshold  of 1.3 volts at 0.5 milliseconds, and current threshold was 1.9 MA.  With  these acceptable parameters recorded, the device was implanted.  The pocket  was copiously irrigated with antibiotic containing saline solution and the  leads were attached to the device which was a Engineer, water. Jude [employer] pulse  generator, model [id number], serial number [id number].  Atrial pacing,  ventricular  pacing, and atrial pacing with intrinsic conduction were identified.  Hemostasis was assured.  The leads and pulse generator were then placed in  the pocket, the wound was closed in three layers in a normal fashion.  The  wound was washed, dried, and a Benzoin and Steri-Strip dressing was applied.  Needle counts, sponge counts, and instrument counts were correct at the end  of the procedure according to the staff.  The patient tolerated the  procedure without apparent complications.           ______________________________  Duke Salvia, M.D.     SCK/MEDQ  D:  10/16/2005  T:  10/16/2005  Job:  [id number]

## 2011-02-02 NOTE — Discharge Summary (Signed)
NAMESAI, ZINN NO.:  [id number]   MEDICAL RECORD NO.:  [id number]          PATIENT TYPE:  OIB   LOCATION:  2899                         FACILITY:  [facility]   PHYSICIAN:  Maple Mirza, P.A. DATE OF BIRTH:  1923-05-01   DATE OF ADMISSION:  10/16/2005  DATE OF DISCHARGE:  10/16/2005                                 DISCHARGE SUMMARY   PRINCIPAL DIAGNOSES:  1.  The patient has a Fish farm manager pacemaker implanted secondary to      atrial fibrillation post termination pauses.  2.  Pacemaker at elective replacement indicator.  3.  Generator change, October 16, 2005, with implantation of St. Jude      Victory XL DR dual chamber pacemaker.   SECONDARY DIAGNOSES:  1.  History of atrial fibrillation/flutter.  2.  Status post radiofrequency catheter ablation, [facility],      September 2005.  3.  Recurrence of atrial fibrillation with repeat radiofrequency catheter      ablation, July 02, 2005, and started on Flecainide.  4.  Lower extremity edema on Flecainide with discontinuance.  5.  Hospitalized November 19th to August 08, 2005, started on Tikosyn      therapy and cardioverted after five doses of Tikosyn to sinus rhythm.      Patient maintained sinus rhythm.  6.  Chronic Coumadin.  7.  Hypertension.  8.  Treated hypothyroidism.   ALLERGIES:  1.  WELCHOL.  2.  LIPITOR.  3.  ZETIA.  4.  CLONIDINE.   PROCEDURE:  October 16, 2005, explantation of existing pacemaker with  implantation of St. Jude Victory dual chamber pacemaker by Dr. Sherryl Manges.   LABORATORY DATA:  Laboratory studies pertaining to this admission taken  October 01, 2005:  Complete blood count:  White cells 5.7, hemoglobin 13.3,  hematocrit 37.7, platelets 266.  PT was 18.3 and INR 2.1.  Serum  electrolytes, January 15th:  Sodium 142, potassium 5, chloride 103,  carbonate 33, glucose 95, BUN is 15, creatinine 0.9, magnesium 2.2.   DISCHARGE MEDICATIONS:  The patient  discharges on his preoperative  indications:  1.  Coumadin as indicated by the Coumadin Clinic.  2.  Tikosyn 250 mcg twice daily.  3.  Lasix 40 mg daily.  4.  Levothyroxine 88 mcg daily.  5.  Coreg 18.75 mg twice daily.  6.  Avapro 300 mg daily.  7.  Aspirin 81 mg daily.  8.  K-Dur 20 mEq daily.  9.  Lanoxin 0.125 mg daily.  10. Multivitamin daily.  11. Mag-Ox 400 mg daily.   FOLLOWUP:  The patient will follow up at the [facility]  [facility] on Monday, February 12th, at 9:15 in the morning.   SPECIAL INSTRUCTIONS:  On January 31st, he will remove the bandage and leave  the incision open to air.  He is asked to keep his incision dry for the next  seven days, sponge bathe until Tuesday, February 6th.      Maple Mirza, P.A.     GM/MEDQ  D:  10/16/2005  T:  10/16/2005  Job:  [id number]   cc:  Georgina Quint. Plotnikov, M.D. [facility]  [address]  [place]  [place] [address]

## 2011-02-02 NOTE — Discharge Summary (Signed)
NAMEMAYANK, Ricky [name]              ACCOUNT NO.:  [id number]   MEDICAL RECORD NO.:  [id number]          PATIENT TYPE:  INP   LOCATION:  2001                         FACILITY:  [facility]   PHYSICIAN:  Ricky [name], M.D.  DATE OF BIRTH:  [birth year]   DATE OF ADMISSION:  08/05/2005  DATE OF DISCHARGE:  08/08/2005                                 DISCHARGE SUMMARY   PRINCIPAL DIAGNOSES:  1.  Admitted for Ticocin therapy.      1.  No spontaneous conversion after 36 hours.      2.  DCCV with conversion to sinus rhythm, hospital day #3.      3.  Maintaining sinus rhythm for a 24-hour period post conversion to          sinus rhythm.  2.  History of atrial fibrillation/flutter.      1.  DCCV about 8 years ago and started on amiodarone.  The patient          stopped amiodarone in September 2005 secondary to neuropathy in the          lower extremities.      2.  Radiofrequency catheter ablation at [facility] in September          2005.      3.  Recurrence with repeat radiofrequency catheter ablation July 02, 2005, and started on flecainide.      4.  Lower extremity edema, on flecainide; this has been stopped, and now          admitted for Ticocin August 05, 2005.  3.  Status post implantation of St. Jude Trilogy pacemaker secondary to post      atrial fibrillation termination pauses.   SECONDARY DIAGNOSES:  1.  Hypertension.  2.  Hypothyroidism.  3.  Chronic Coumadin therapy.   ALLERGIES:  1.  WELCHOL.  2.  LIPITOR.  3.  ZETIA.  4.  CLONIDINE.   PROCEDURES THIS HOSPITALIZATION:  On August 07, 2005, direct current  cardioversion.  The patient from slow atrial flutter to sinus rhythm.   HISTORY OF PRESENT ILLNESS:  Ricky [name] is an 77 year old male who underwent  a repeat attempt at radiofrequency catheter ablation of atrial fibrillation  about the middle of October 2006.  He underwent cardioversion post  procedure, and was placed on flecainide.  He did have  significant  accumulation of edema and shortness of breath, which seems to be attributed  to flecainide, and this was discontinued.  Echocardiogram showed an ejection  fraction of 30% to 35%.  This is a new finding.  He had septal hypokinesis.  He has been started on diuretics, but not experiencing significant  improvement.  His flecainide dose was decreased because of concern of its  use in the context of LV dysfunction.  The patient feels that his heart was  racing with the decreased dose, and so has increased his flecainide.  Because of dyspnea and increased edema with flecainide, this will be  discontinued, and the patient will admitted to begin elective Ticocin  therapy, especially  in light of decreased ejection fraction by  echocardiogram.   HOSPITAL COURSE:  The patient was admitted electively on August 05, 2005  to start Ticocin therapy.  Laboratory studies were drawn, and a creatinine  clearance was estimated.  The patient was started on 250 mcg b.i.d. of  flecainide with strict monitoring of his QTc.  Throughout this  hospitalization, and after multiple doses of flecainide, his QTc had not  appreciably elongated, staying in the 410 to 430 ms range.  He did not  convert spontaneously on Ticocin, and after the fourth dose, he underwent  direct current cardioversion under sedation with conversion to sinus rhythm.  He has maintained sinus rhythm in the 24-hour period post conversion.  He  has been on Lasix throughout this hospitalization, as well as potassium.  He  has lost 2.3 pounds in a 3-day period on Lasix.  His potassium has shown a  steady decline, even with potassium supplementation from 5.4 to 3.8.  He was  given a make up dose of potassium prior to discharge on August 08, 2005.  In addition, his INR showed a steady decline this hospitalization on 5 mg  daily, from 2.4 on admission to 1.8 on August 08, 2005.  This patient will  discharge on Lovenox injections, and he  will resume potassium at 5 mg daily,  and without the 2.5 mg Monday dose.  The patient feels well at the time of  discharge.  He is achieving 97% oxygen saturation on room air, ambulating  independently.  Blood pressure 109/62.  His magnesium was 2.1 on August 08, 2005.  His basic metabolic panel on August 08, 2005 revealed a sodium  of 141, potassium 3.8, chloride 102, bicarbonate 28, BUN 12, creatinine 0.9,  glucose 89.  CBC was not obtained this hospitalization.  His PT on the day  of discharge was 21.4, INR 1.8.  The patient had received 7.5 mg of Coumadin  on August 07, 2005, and his INR should rebound in the target range of 2 to  3 on August 09, 2005.   FOLLOW UP:  1.  He will follow up in the Coumadin Clinic on Friday, August 10, 2005.      The [facility]in Clinic will call him with a time to come by.  2.  He will also present to the office of [facility] on Thursday,      September 06, 2005, for a follow up.  This appointment will be made and      transmitted to the patient.   DISCHARGE MEDICATIONS:  1.  Lovenox 70 mg one syringe to be given at 11 p.m. on the evening of      August 08, 2005.  2.  The patient is to continue his magnesium 400 mg daily.  3.  Ticocin 250 mcg one tablet in the morning and one tablet in the evening,      strictly 12 hours apart.  4.  Coreg 18.75 mg twice daily.  5.  Avapro 300 mg daily.  6.  Lanoxin 0.125 mg daily.  7.  Enteric-coated aspirin 81 mg daily.  8.  Levoxyl 8 mcg daily.  9.  Maxzide 75/50 one tablet daily.  10. Coumadin 5 mg daily.  11. Multivitamin daily.  12. Vitamin C 500 mg daily.  13. Vitamin B complex daily.  14. Folic acid 1 mg daily.   LABORATORY DATA:  There are no outstanding labs or studies.   The duration of this dictation is  20 minutes.      Ricky [name], P.A.    ______________________________  Ricky [name], M.D.    GM/MEDQ  D:  08/08/2005  T:  08/09/2005  Job:  [id number]  cc:   Georgina Quint. Plotnikov, M.D. [facility]  [address]  [place]  [place] [address]   Clydie Braun, M.D.  [facility]

## 2011-02-02 NOTE — Op Note (Signed)
[facility]  Patient:    Ricky [name], Ricky [name]                     MRN: [id number] Proc. Date: 11/15/00 Adm. Date:  [id number] Disc. Date: [id number] Attending:  Nelma Rothman Iii CC:         Lacretia Leigh. Quintella Reichert, M.D.   Operative Report  PREOPERATIVE DIAGNOSIS:  Right epididymal mass.  POSTOPERATIVE DIAGNOSIS:  Right epididymal mass.  OPERATION PERFORMED:  Right epididymectomy.  SURGEON:  Gaynelle Arabian, M.D.  ANESTHESIA:  Spinal.  ESTIMATED BLOOD LOSS:  15 cc.  TUBES:  A 1/4 inch Penrose drain.  COMPLICATIONS:  None.  INDICATIONS FOR PROCEDURE:  Ricky [name] is a very nice 77 year old white male who presented with some tenderness in the right testicular area. On examination, he was found to have a firm mass and on ultrasound there was a heteroechogenic epididymal mass which may be inflammatory but a neoplasm was certainly a possibility. After understanding the risks, benefits, and alternatives, stopping his Coumadin, he has elected to proceed with right epididymectomy.  DESCRIPTION OF PROCEDURE:  The patient was placed in the supine position and after proper spinal anesthesia was prepped and draped with Betadine in sterile fashion. A transverse right scrotal incision was made, sharp dissection was carried down through dartos tunica. The tunica vaginalis was incised and the testicle and adnexa were delivered into the operative field. There was some enlargement of the epididymis to the mid epididymal area and a very firm mass within it which we felt needed to be excised. Towards the tail of the epididymis, the epididymis was encircled and ligated proximally and distally with 3-0 chromic catgut. Dissection was carried down to the testicle and serosal margins were incised with Bovie coagulation cautery and bluntly and the was epididymal testicular junction was taken down and package clamped and the head and body of the epididymis was removed and  submitted to pathology. Each of the areas were ligated with 3-0 chromic stick ties to close the rete testis off. Good hemostasis noted to be present, thorough irrigation was performed. The testicle and adnexa were placed back into the scrotal sac. The testicle appeared to be well vascularized. A 1/4 inch Penrose drain was placed through a separate stab incision and sutured in place with 3-0 chromic catgut. Dartos tunica was closed with a running 3-0 chromic catgut suture and the skin was closed with a running 3-0 chromic horizontal type mattress suture. The wound was dressed, ______ in scrotal support. The patient tolerated the procedure well and was taken to the recovery room stable.  Of note, his chest x-ray had an abnormality and he will obtain a CT of the chest today. DD:  11/15/00 TD:  11/18/00 Job: [id number]

## 2011-02-02 NOTE — Discharge Summary (Signed)
NAMEDEBRA, Ricky [name]              ACCOUNT NO.:  [id number]   MEDICAL RECORD NO.:  [id number]          PATIENT TYPE:  OIB   LOCATION:  2899                         FACILITY:  [facility]   PHYSICIAN:  Duke Salvia, M.D.  DATE OF BIRTH:  1923-01-20   DATE OF ADMISSION:  12/26/2004  DATE OF DISCHARGE:  12/26/2004                                 DISCHARGE SUMMARY   __________   DISCHARGE DIAGNOSES:  1.  Admission for atrial flutter on November 12, 2004 in a setting of      palpitation, elevated blood pressure and shortness of breath.  The      patient treated with Cardizem and released.  2.  Admitted to [facility] December 26, 2004 for possible atrial      flutter ablation versus DC cardioversion if flutter is a left atrial      mechanism.  The patient is status post DC cardioversion April 11 by Dr.      Sherryl Manges.   SECONDARY DISCHARGE DIAGNOSES:  1.  History of atrial fibrillation, status post atrial fibrillation ablation      at [facility], June 12, 2004.  2.  Hypertension.  3.  Cardiomyopathy, ejection fraction 35-40% on catheterization 1997,      ejection fraction 63% on echocardiogram in [facility], August 2005.  4.  Nonobstructive coronary artery disease per left heart catheterization,      1997.  5.  Status post [employer] pacemaker in patient with post atrial fibrillation      termination pauses.  6.  Hypothyroidism.   PROCEDURE:  DC cardioversion of atrial flutter on December 26, 2004.   DISCHARGE DISPOSITION:  [name] is ready for discharge post DC  cardioversion April 11.  He is having no respiratory distress and he does  not feel a palpitation, he is not short of breath.  His INR on admission was  2.0 and he has a 4-week history of therapeutic INRs, ranging between 2.0 and  2.7.   DISCHARGE MEDICATIONS:  1.  Coumadin as before, 5 mg tablets daily except for one half tablet or 2.5      mg Wednesday and Friday.  2.  Enteric-coated  aspirin 81 mg daily.  3.  Vitamin C 500 mg daily.  4.  Multivitamin daily.  5.  Coreg 18.75 mg b.i.d.  6.  Folic acid 1 mg daily.  7.  Levoxyl 100 mcg daily.  8.  __________  300 mg daily.  9.  Vitamin B complex daily.  10. Maxzide 7/50 daily.  11. For pain management Tylenol 325 mg one to two tablets q.4-6h.   He is to avoid driving for the next 2 days and to avoid straining or heavy  lifting for the next 2 weeks.   DIET:  Low sodium, low cholesterol.   He may shower.  He is to call [phone] if he experiences pain or swelling of  his catheterization site.  His office visit is at [facility], [address]  1. Coumadin Clinic, Tuesday, January 02, 2005 at 11:15  a.m. with  electrocardiogram.  2. Dr. Graciela Husbands Thursday, Jan 18, 2005 at 2:30  p.m. with possible medication changes.  Of note, the patient is anxious to  be able to come off Coumadin therapy.   BRIEF HISTORY:  [name] is a 77 year old male who has a history of atrial  fibrillation.  He is status post radiofrequency catheter ablation of his  atrial fibrillation on June 12, 2004.  This was done at [facility], [place] and the patient was taking amiodarone at the time.  The patient has done well since that time but presented November 12, 2004 to  [facility] emergency room with palpitations, shortness of  breath and increased blood pressure.  Electrocardiogram showed atrial  flutter with variable conduction, atypical features.  He had elevation of A  waves in V1 and also elevation in the inferior leads.  His heart rate  settled down after having been given Cardizem 60 mg orally.  The patient  then went home.  He presented subsequently to Dr. Graciela Husbands November 20, 2004.  Dr.  Graciela Husbands looked at the electrocardiograms, analyzing them as atrial flutter and  planned radiofrequency catheter ablation.  The thought was that amiodarone  had concealed a cavo tricuspid pathway.  The patient  was scheduled for  radiofrequency catheter ablation 5 weeks ago but this was cancelled  secondary to low INR.  INR for the last 4 months has been 2.0 or greater and  INR on presentation was 2.0.  The patient will present for ablation and  possible DC cardioversion if the flutter is left atrial location.  The  patient was unable to qualify for a __________ secondary to prior ablation  for atrial fibrillation.   HOSPITAL COURSE:  The patient presents December 26, 2004 for elective  radiofrequency catheter ablation of atrial flutter.  The finding was that  was a left atrial process, the patient was DC cardioverted successfully with  no return or stimulation of tachyarrhythmia.  The patient is discharging the  same day with the medications and followup as shown above.      GM/MEDQ  D:  12/26/2004  T:  12/26/2004  Job:  [id number]   cc:   Clydie Braun, M.D.  [facility]  [place], [place]

## 2011-02-03 ENCOUNTER — Observation Stay (HOSPITAL_COMMUNITY)
Admission: EM | Admit: 2011-02-03 | Discharge: 2011-02-04 | Disposition: A | Discharge status: Home / Self Care | Payer: Medicare Other | Attending: Internal Medicine | Admitting: Internal Medicine

## 2011-02-03 ENCOUNTER — Emergency Department (HOSPITAL_COMMUNITY): Payer: Medicare Other

## 2011-02-03 DIAGNOSIS — I4892 Unspecified atrial flutter: Secondary | ICD-10-CM | POA: Insufficient documentation

## 2011-02-03 DIAGNOSIS — Z7901 Long term (current) use of anticoagulants: Secondary | ICD-10-CM | POA: Insufficient documentation

## 2011-02-03 DIAGNOSIS — E039 Hypothyroidism, unspecified: Secondary | ICD-10-CM | POA: Insufficient documentation

## 2011-02-03 DIAGNOSIS — I2510 Atherosclerotic heart disease of native coronary artery without angina pectoris: Secondary | ICD-10-CM | POA: Insufficient documentation

## 2011-02-03 DIAGNOSIS — Z79899 Other long term (current) drug therapy: Secondary | ICD-10-CM | POA: Insufficient documentation

## 2011-02-03 DIAGNOSIS — F411 Generalized anxiety disorder: Secondary | ICD-10-CM | POA: Insufficient documentation

## 2011-02-03 DIAGNOSIS — I498 Other specified cardiac arrhythmias: Secondary | ICD-10-CM | POA: Insufficient documentation

## 2011-02-03 DIAGNOSIS — Z950 Presence of cardiac pacemaker: Secondary | ICD-10-CM | POA: Insufficient documentation

## 2011-02-03 DIAGNOSIS — I10 Essential (primary) hypertension: Secondary | ICD-10-CM | POA: Insufficient documentation

## 2011-02-03 DIAGNOSIS — I4891 Unspecified atrial fibrillation: Secondary | ICD-10-CM | POA: Insufficient documentation

## 2011-02-03 DIAGNOSIS — R079 Chest pain, unspecified: Principal | ICD-10-CM | POA: Insufficient documentation

## 2011-02-03 LAB — COMPREHENSIVE METABOLIC PANEL
AST: 26 U/L (ref 0–37)
Albumin: 3.7 g/dL (ref 3.5–5.2)
Calcium: 9.5 mg/dL (ref 8.4–10.5)
Creatinine, Ser: 0.68 mg/dL (ref 0.4–1.5)
GFR calc Af Amer: >60 mL/min (ref >60)

## 2011-02-03 LAB — PROTIME-INR: Prothrombin Time: 22.5 seconds — ABNORMAL HIGH (ref 11.6–15.2)

## 2011-02-03 LAB — POCT CARDIAC MARKERS
CKMB, poc: <1 ng/mL — ABNORMAL LOW (ref 1.0–8.0)
Troponin i, poc: <0.05 ng/mL (ref 0.00–0.09)
Troponin i, poc: <0.05 ng/mL (ref 0.00–0.09)

## 2011-02-03 LAB — APTT: aPTT: 36 seconds (ref 24–37)

## 2011-02-03 LAB — CK TOTAL AND CKMB (NOT AT ARMC)
CK, MB: 2.3 ng/mL (ref 0.3–4.0)
Total CK: 54 U/L (ref 7–232)

## 2011-02-03 LAB — DIFFERENTIAL
Eosinophils Absolute: 0.2 10*3/uL (ref 0.0–0.7)
Lymphs Abs: 1.8 10*3/uL (ref 0.7–4.0)
Monocytes Relative: 8 % (ref 3–12)
Neutro Abs: 3.1 10*3/uL (ref 1.7–7.7)
Neutrophils Relative %: 56 % (ref 43–77)

## 2011-02-03 LAB — CBC
Hemoglobin: 13.8 g/dL (ref 13.0–17.0)
MCH: 33 pg (ref 26.0–34.0)
MCV: 98.1 fL (ref 78.0–100.0)
RBC: 4.18 MIL/uL — ABNORMAL LOW (ref 4.22–5.81)

## 2011-02-04 DIAGNOSIS — R079 Chest pain, unspecified: Secondary | ICD-10-CM

## 2011-02-04 LAB — COMPREHENSIVE METABOLIC PANEL
AST: 20 U/L (ref 0–37)
Albumin: 3.2 g/dL — ABNORMAL LOW (ref 3.5–5.2)
Chloride: 101 mEq/L (ref 96–112)
Creatinine, Ser: 0.67 mg/dL (ref 0.4–1.5)
GFR calc Af Amer: >60 mL/min (ref >60)
Potassium: 4.2 mEq/L (ref 3.5–5.1)
Total Bilirubin: 0.7 mg/dL (ref 0.3–1.2)

## 2011-02-04 LAB — CARDIAC PANEL(CRET KIN+CKTOT+MB+TROPI)
CK, MB: 2.3 ng/mL (ref 0.3–4.0)
Troponin I: <0.3 ng/mL (ref <0.30)

## 2011-02-06 ENCOUNTER — Telehealth: Payer: Self-pay | Admitting: Internal Medicine

## 2011-02-06 NOTE — H&P (Signed)
Ricky [name], Ricky [name] NO.:  [id number]  MEDICAL RECORD NO.:  [id number]           PATIENT TYPE:  I  LOCATION:  2037                         FACILITY:  [facility]  PHYSICIAN:  Armanda Magic, M.D.     DATE OF BIRTH:  [birth year]  DATE OF ADMISSION:  02/03/2011 DATE OF DISCHARGE:                             HISTORY & PHYSICAL   REFERRING PHYSICIAN:  Duke Salvia, MD, [facility]  PRIMARY CARDIOLOGIST:  Duke Salvia, MD, [facility]  CHIEF COMPLAINT:  Chest pain.  HISTORY OF PRESENT ILLNESS:  This is an 77 year old male with a history of atrial fibrillation status post AV node ablation and permanent pacemaker, hypertension who presented with complaints of chest pain.  He stated over the past 2 days he would get a strange feeling in his chest and then feel a pressure in the midsternal region.  He would check his pulse and said his pulse was 36 beats per minute.  The pressure did not radiate and lasted only a few seconds to a minute at a time.  He denied any shortness of breath, diaphoresis, or nausea.  These symptoms have been occurring over the past few days.  He denies any dizziness or syncope.  PAST MEDICAL HISTORY:  Chronic atrial fibrillation with remote history of radiofrequency ablation back in 2005 and 2006, status post permanent pacemaker for syncope, felt due to posttermination pauses.  He had generator change out in 2007.  He has a history of hypertension, anxiety, hypothyroidism, and nonobstructive coronary artery disease by cath in 1997, ejection fraction at the time of cath was 35-40%, but repeat 2-D echocardiogram in 2005 showed EF 65%.  ALLERGIES: 1. WELCHOL. 2. ZETIA. 3. CLONIDINE. 4. LIPITOR. 5. CARDIZEM. 6. AMIODARONE. 7. AMOXICILLIN. 8. LOTENSIN.  PAST SURGICAL HISTORY:  Status post permanent pacemaker placement.  FAMILY HISTORY:  His mother died at 34 of cervical cancer.  His father died at 80 of viral infection.  He has nine siblings, one  sister had a valve replacement.  SOCIAL HISTORY:  He is single.  He is retired.  He denies any tobacco or alcohol use.  No IV drug use.  MEDICATIONS: 1. Levothroid 88 mcg daily. 2. Avapro 300 mg daily. 3. Furosemide 20 mg daily. 4. Coreg 6.25 mg 3 tablets b.i.d. 5. Coumadin. 6. Vitamin D3 1000 units daily. 7. Botox in each eye every 4 months. 8. Multivitamin.  REVIEW OF SYSTEMS:  Otherwise as stated in HPI is negative.  PHYSICAL EXAMINATION:  VITAL SIGNS:  Blood pressure is 178/96, heart rate 65, and O2 saturations 100% on room air. GENERAL:  This is a well-developed, well-nourished white male in no distress. HEENT:  Benign. NECK:  Supple without lymphadenopathy.  Carotid upstrokes are +2 bilaterally.  No bruits. LUNGS:  Clear to auscultation throughout. HEART:  Regular rate and rhythm.  No rubs or gallops.  There is a 1/6 systolic murmur at the left lower sternal border. ABDOMEN:  Soft, nontender, and nondistended.  Normoactive bowel sounds. No hepatosplenomegaly. EXTREMITIES:  No cyanosis, erythema, or edema.  +2 dorsalis pedis pulses bilaterally.  LABORATORY DATA:  Sodium 136, potassium 4.3, chloride 100,  bicarb 28, BUN 12, and creatinine 0.68.  White cell count 5.5, hemoglobin 13.8, hematocrit 41, and platelet count 208.  LFTs are normal.  INR 1.96. Point-of-care markers negative x2.  Chest x-ray shows no active disease. Pacemaker interrogation in the emergency room showed a 100% atrial fibrillation and 100% paced.  Normal threshold in the DDIR mode at 65 beats per minute with no brady arrhythmias noted.  EKG shows atrial flutter with V pacing at 66 beats per minute.  ASSESSMENT: 1. Chest pain, atypical, with negative point-of-care markers and paced     EKG. 2. Subjective slow heart rate with normal functioning pacemaker by     pacemaker interrogation.  The patient is adamant that his heart     rate is in the 30s. 3. Chronic atrial fibrillation. 4. History of  syncope, status post permanent pacemaker placement. 5. Systemic anticoagulation with subtherapeutic INR. 6. Nonobstructive coronary artery disease by catheterization in 1997. 7. History of cardiomyopathy with ejection fraction 35-40%, cath in     1997, but echo done in 2005 showed normal EF of 65%.  PLAN:  Admit to telemetry bed.  Cycle cardiac enzymes.  Coumadin per pharmacy.  We will make n.p.o. after midnight.  Further workup per [facility].     Armanda Magic, M.D.    TT/MEDQ  D:  02/03/2011  T:  02/04/2011  Job:  [id number]  Electronically Signed by Armanda Magic M.D. on 02/06/2011 01:41:20 PM

## 2011-02-06 NOTE — Telephone Encounter (Signed)
Spoke with pt who reports he was given Benicar when recently hospitalized and he feels this helped with his blood pressure. He was not discharged on this medicine.  Is currently on Avapro 300 mg daily and Coreg 18.75 mg twice daily. He is asking if he can be changed to Benicar.  I asked pt to monitor his blood pressure daily and call us with the readings early next week to see if any med changes needed.  Blood pressure this AM was 140/82. Pt agreeable with this plan.

## 2011-02-07 NOTE — Telephone Encounter (Signed)
Will forward to Dr. Klein for review. 

## 2011-02-08 NOTE — Telephone Encounter (Signed)
Reasonable to try benicar if bp are not well controlled on cozaar

## 2011-02-13 ENCOUNTER — Ambulatory Visit (INDEPENDENT_AMBULATORY_CARE_PROVIDER_SITE_OTHER): Payer: Medicare Other | Admitting: *Deleted

## 2011-02-13 DIAGNOSIS — I4891 Unspecified atrial fibrillation: Secondary | ICD-10-CM

## 2011-02-19 NOTE — Telephone Encounter (Signed)
Will discuss with Dr. Graciela Husbands what dose of benicar the patient should be on since he was not discharged on this med.

## 2011-02-25 NOTE — Telephone Encounter (Signed)
In hosp he received 40 mg daily we can try that Will need bmet in two weeks steeev

## 2011-02-27 NOTE — Telephone Encounter (Signed)
I spoke with the patient. I made him aware that Dr. Graciela Husbands has said we could change his Avapro to Benicar, but the patient reports to me that his BP readings have been pretty well controlled since 5/22. He averages a SBP in the 120-130's. He did report a couple of readings of 151/79 & 140/85, Since the patient is doing ok, I have explained we will not change his meds now. He will bring his BP readings to his post hospital visit with Lilian Coma on 03/06/11. The patient is agreeable.

## 2011-03-06 ENCOUNTER — Ambulatory Visit (INDEPENDENT_AMBULATORY_CARE_PROVIDER_SITE_OTHER): Payer: Medicare Other | Admitting: Physician Assistant

## 2011-03-06 ENCOUNTER — Encounter: Payer: Self-pay | Admitting: Physician Assistant

## 2011-03-06 VITALS — BP 132/78 | HR 65 | Ht 72.0 in | Wt 161.8 lb

## 2011-03-06 DIAGNOSIS — R079 Chest pain, unspecified: Secondary | ICD-10-CM

## 2011-03-06 DIAGNOSIS — I493 Ventricular premature depolarization: Secondary | ICD-10-CM

## 2011-03-06 DIAGNOSIS — I4949 Other premature depolarization: Secondary | ICD-10-CM

## 2011-03-06 DIAGNOSIS — I10 Essential (primary) hypertension: Secondary | ICD-10-CM

## 2011-03-06 DIAGNOSIS — I4891 Unspecified atrial fibrillation: Secondary | ICD-10-CM

## 2011-03-06 DIAGNOSIS — IMO0002 Reserved for concepts with insufficient information to code with codable children: Secondary | ICD-10-CM

## 2011-03-06 NOTE — Patient Instructions (Signed)
Your physician has requested that you have an echocardiogram PVC. Echocardiography is a painless test that uses sound waves to create images of your heart. It provides your doctor with information about the size and shape of your heart and how well your heart's chambers and valves are working. This procedure takes approximately one hour. There are no restrictions for this procedure.  Your physician recommends that you schedule a follow-up appointment in: Pt needs an appointment in August to have device check with Gunnar Fusi or Belenda Cruise

## 2011-03-06 NOTE — Progress Notes (Signed)
History of Present Illness: Primary Electrophysiologist:  Dr. Sherryl Manges   Ricky [name] is a 77 y.o. male with a history of atrial fibrillation/flutter that is permanent, status post AV nodal ablation, chronic Coumadin therapy, status post pacemaker, hypothyroidism, hypertension and anxiety.  Cardiac catheterization in 1997 demonstrated no obstructive disease.  Last echocardiogram in 2005 demonstrated normal LV function.  He presents for post-hospitalization follow up.  He was admitted 5/19-5/20.  He has no chest discomfort and noted a pulse rate of 36 on his own.  He ruled out for myocardial infarction.  His EKG seemed to demonstrate atypical atrial flutter.  It was felt that the patient was experiencing palpitations and a subjective heart rate in the 30s in the setting of PVCs with reduced cardiac output during his PVCs.  Reassurance was provided to the patient.  Today he notes that he has not had any further chest discomfort.  His pulse rate seems to be normal.  He has been watching his blood pressures carefully.  He called the office once to determine whether or not he should take Benecar which was given to him in the hospital in lieu of Avapro.  However, his blood pressure seemed to be well controlled with the Avapro.  He denies dyspnea.  He denies syncope.  He denies palpitations.  Past Medical History  Diagnosis Date  . HYPOTHYROIDISM 09/04/2007  . ANXIETY 04/26/2008  . Essential hypertension, benign 10/03/2007  . LYMPHADENOPATHY 04/06/2009  . TOBACCO USE, QUIT 07/14/2009  . Atrial fibrillation 10/31/2010    Coumadin therapy  . Pacemaker     Status post AV nodal ablation    Current Outpatient Prescriptions  Medication Sig Dispense Refill  . carvedilol (COREG) 6.25 MG tablet Take 18.75 mg by mouth 2 (two) times daily with a meal.        . Cholecalciferol 1000 UNITS tablet Take 1,000 Units by mouth daily.        . diphenhydrAMINE (BENADRYL) 25 mg capsule Take 1 capsule (25 mg total) by  mouth every 4 (four) hours as needed for itching (1-2 for bee stings).  30 capsule  2  . EPINEPHrine (EPIPEN 2-PAK) 0.3 mg/0.3 mL DEVI Inject 0.3 mg into the muscle once as needed.        . furosemide (LASIX) 20 MG tablet Take 20 mg by mouth daily.        . irbesartan (AVAPRO) 300 MG tablet Take 300 mg by mouth at bedtime.        Marland Kitchen levothyroxine (LEVOTHROID) 88 MCG tablet Take 88 mcg by mouth daily.        . Multiple Vitamin (MULTIVITAMIN) tablet Take 1 tablet by mouth daily.        . nitroGLYCERIN (NITROSTAT) 0.4 MG SL tablet Place 0.4 mg under the tongue every 5 (five) minutes as needed.        . pseudoephedrine (SUDAFED) 30 MG tablet Take 1 tablet (30 mg total) by mouth every 6 (six) hours as needed for congestion (take 2 tabs prn bee sting).  24 tablet  2  . warfarin (COUMADIN) 5 MG tablet Take by mouth as directed.        . predniSONE (DELTASONE) 10 MG tablet Take 4 tabs prn bee sting  20 tablet  1  . DISCONTD: carvedilol (COREG) 6.25 MG tablet Take 6.25 mg by mouth 2 (two) times daily with a meal. Take 3 tablets three times a day         Allergies: Allergies  Allergen  Reactions  . Amiodarone Hcl   . Amoxicillin   . Atorvastatin   . Benazepril Hcl   . Clonidine Hydrochloride   . Colesevelam   . Diltiazem Hcl   . Ezetimibe     Vital Signs: BP 132/78  Pulse 65  Ht 6' (1.829 m)  Wt 161 lb 12.8 oz (73.392 kg)  BMI 21.94 kg/m2  PHYSICAL EXAM: Well nourished, well developed, in no acute distress HEENT: normal Neck: no JVD Cardiac:  normal S1, S2; RRR; 1/6 systolic murmur Along the left sternal border Lungs:  clear to auscultation bilaterally, no wheezing, rhonchi or rales Abd: soft, nontender, no hepatomegaly Ext: no edema Skin: warm and dry Neuro:  CNs 2-12 intact, no focal abnormalities noted  EKG:  Atrial flutter, heart rate 65, ventricular paced  ASSESSMENT AND PLAN:

## 2011-03-06 NOTE — Assessment & Plan Note (Signed)
No apparent recurrence.  He has not had an echocardiogram several years.  I will set him up for an echocardiogram to reassess his LV function.

## 2011-03-06 NOTE — Assessment & Plan Note (Signed)
This was in the setting of symptomatic PVCs.  He has had no recurrence.  No further workup at this time.  He will follow up with Dr. Graciela Husbands as scheduled.

## 2011-03-06 NOTE — Assessment & Plan Note (Signed)
Controlled.  

## 2011-03-06 NOTE — Assessment & Plan Note (Signed)
Continue Coumadin.  He has had an AV nodal ablation.  His rate is controlled.  Proceed with 2-D echocardiogram is noted.  Follow up with Dr. Graciela Husbands as scheduled.

## 2011-03-13 ENCOUNTER — Ambulatory Visit (INDEPENDENT_AMBULATORY_CARE_PROVIDER_SITE_OTHER): Payer: Medicare Other | Admitting: *Deleted

## 2011-03-13 DIAGNOSIS — I4891 Unspecified atrial fibrillation: Secondary | ICD-10-CM

## 2011-03-13 LAB — POCT INR: INR: 2.2

## 2011-03-15 ENCOUNTER — Encounter: Payer: Self-pay | Admitting: Physician Assistant

## 2011-03-15 ENCOUNTER — Ambulatory Visit (HOSPITAL_COMMUNITY): Payer: Medicare Other | Attending: Internal Medicine | Admitting: Radiology

## 2011-03-15 DIAGNOSIS — I079 Rheumatic tricuspid valve disease, unspecified: Secondary | ICD-10-CM | POA: Insufficient documentation

## 2011-03-15 DIAGNOSIS — R002 Palpitations: Secondary | ICD-10-CM | POA: Insufficient documentation

## 2011-03-15 DIAGNOSIS — I10 Essential (primary) hypertension: Secondary | ICD-10-CM | POA: Insufficient documentation

## 2011-03-15 DIAGNOSIS — I493 Ventricular premature depolarization: Secondary | ICD-10-CM

## 2011-03-15 DIAGNOSIS — I059 Rheumatic mitral valve disease, unspecified: Secondary | ICD-10-CM | POA: Insufficient documentation

## 2011-04-09 NOTE — Discharge Summary (Signed)
NAMEBERNHARD, Ricky [name] NO.:  [id number]  MEDICAL RECORD NO.:  [id number]           PATIENT TYPE:  I  LOCATION:  2037                         FACILITY:  [facility]  PHYSICIAN:  Ricky Range, [name]       DATE OF BIRTH:  January 11, 1923  DATE OF ADMISSION:  02/03/2011 DATE OF DISCHARGE:  02/04/2011                              DISCHARGE SUMMARY   CARDIOLOGIST:  Ricky Salvia, [name], Ricky [name]  PRIMARY CARE PHYSICIAN:  Ricky Quint. Plotnikov, [name]  REASON FOR ADMISSION:  Chest pain.  DISCHARGE DIAGNOSES: 1. Chest pain, suspect secondary to symptomatic premature ventricular     complexes. 2. Primary atrial fibrillation/flutter, on chronic Coumadin therapy. 3. Status post atrioventricular nodal ablation with current pacemaker     implantation. 4. Hypertension. 5. Hypothyroidism. 6. Nonobstructive coronary disease by cardiac catheterization in 1997. 7. Normal left ventricular function by echocardiogram in 2005. 8. Anxiety. ADMISSION HISTORY:  Ricky [name] is an 77 year old male patient with a history atrial fibrillation/flutter status post AV nodal ablation, pacemaker implantation who presented with complaints of strange feeling in his chest and pressure in the mid sternal region.  He also noted a pulse of 36 when this would happen.  He was admitted for further evaluation.  The patient ruled out for myocardial infarction by enzymes.  He was evaluated on the morning of discharge and found to be pain free without shortness of breath or dizziness.  Ricky [name] saw the patient, reviewed his EKG.  It appeared to be an atypical flutter.  He is also ventricular paced.  Given the atypical nature of his chest pain with resolution and negative cardiac markers, no further workup was recommended at this time.  It was felt that his palpitation and subjective heart rate in 30s was likely related to PVCs with reduced cardiac output during a PVC. It was felt that his pacemaker function is normal.  He  was kept on Coumadin for his atrial fibrillation.  It was felt that the patient could follow up Ricky [name], as previously scheduled or return sooner if he had recurrent symptoms.  He is now discharged home in stable condition.  LABORATORY AND ANCILLARY DATA:  Hemoglobin 13.8, INR Feb 04, 2011, 2.2, potassium 4.2, creatinine 0.67.  Cardiac markers negative x3.  TSH 1.396.  Chest x-ray, emphysematous changes, no evidence of acute cardiopulmonary disease.  ALLERGIES:  LIPITOR, WELCHOL, and ZETIA.  DISCHARGE MEDICATIONS: 1. Cholecalciferol 1000 units daily. 2. Nitroglycerin p.r.n. chest pain. 3. Avapro 300 mg daily. 4. Coreg 6.25 mg 3 tablets twice daily. 5. Furosemide 20 mg daily. 6. Levothyroxine 88 mcg daily. 7. Warfarin 5 mg daily.  ACTIVITIES:  He is to increase activity slowly.  DIET:  Low-fat, low-sodium diet.  Wound care is not applicable.  FOLLOWUP:  With Ricky [name] as scheduled.  He has been advised to call for sooner or followup as needed.  Total physician and PA time greater than 30 minutes since discharge.     Tereso Newcomer, PA-C   ______________________________ Ricky Range, [name]    SW/MEDQ  D:  02/04/2011  T:  02/05/2011  Job:  [id number]  cc:  Ricky Quint. Plotnikov, [name]  Electronically Signed by Tereso Newcomer PA-C on 03/19/2011 12:03:08 PM Electronically Signed by Ricky Range [name] on 04/09/2011 09:57:21 AM

## 2011-04-10 ENCOUNTER — Ambulatory Visit (INDEPENDENT_AMBULATORY_CARE_PROVIDER_SITE_OTHER): Payer: Medicare Other | Admitting: *Deleted

## 2011-04-10 DIAGNOSIS — I4891 Unspecified atrial fibrillation: Secondary | ICD-10-CM

## 2011-05-08 ENCOUNTER — Ambulatory Visit (INDEPENDENT_AMBULATORY_CARE_PROVIDER_SITE_OTHER): Payer: Medicare Other | Admitting: *Deleted

## 2011-05-08 DIAGNOSIS — I4891 Unspecified atrial fibrillation: Secondary | ICD-10-CM

## 2011-05-08 LAB — POCT INR: INR: 2.4

## 2011-05-09 ENCOUNTER — Ambulatory Visit (INDEPENDENT_AMBULATORY_CARE_PROVIDER_SITE_OTHER): Payer: Medicare Other | Admitting: *Deleted

## 2011-05-09 DIAGNOSIS — I442 Atrioventricular block, complete: Secondary | ICD-10-CM

## 2011-05-09 NOTE — Progress Notes (Signed)
PPM check 

## 2011-05-23 ENCOUNTER — Other Ambulatory Visit: Payer: Self-pay | Admitting: Internal Medicine

## 2011-06-05 ENCOUNTER — Ambulatory Visit (INDEPENDENT_AMBULATORY_CARE_PROVIDER_SITE_OTHER): Payer: Medicare Other | Admitting: *Deleted

## 2011-06-05 DIAGNOSIS — I4891 Unspecified atrial fibrillation: Secondary | ICD-10-CM

## 2011-06-05 LAB — POCT INR: INR: 2.7

## 2011-06-12 ENCOUNTER — Other Ambulatory Visit: Payer: Self-pay | Admitting: *Deleted

## 2011-06-12 MED ORDER — IRBESARTAN 300 MG PO TABS: 300.0000 mg | ORAL_TABLET | Freq: Every day | ORAL | Status: DC

## 2011-07-03 ENCOUNTER — Encounter: Payer: Medicare Other | Admitting: *Deleted

## 2011-07-04 ENCOUNTER — Encounter: Payer: Medicare Other | Admitting: *Deleted

## 2011-07-06 ENCOUNTER — Ambulatory Visit (INDEPENDENT_AMBULATORY_CARE_PROVIDER_SITE_OTHER): Payer: Medicare Other | Admitting: *Deleted

## 2011-07-06 DIAGNOSIS — IMO0002 Reserved for concepts with insufficient information to code with codable children: Secondary | ICD-10-CM

## 2011-07-06 DIAGNOSIS — Z7901 Long term (current) use of anticoagulants: Secondary | ICD-10-CM

## 2011-07-06 DIAGNOSIS — I4891 Unspecified atrial fibrillation: Secondary | ICD-10-CM

## 2011-07-06 LAB — POCT INR: INR: 3

## 2011-07-09 ENCOUNTER — Other Ambulatory Visit: Payer: Self-pay | Admitting: Internal Medicine

## 2011-07-09 NOTE — Telephone Encounter (Signed)
Please handle RF as patient is managed by coumadin clinic, [facility]

## 2011-07-10 ENCOUNTER — Encounter: Payer: Self-pay | Admitting: Internal Medicine

## 2011-07-10 ENCOUNTER — Ambulatory Visit (INDEPENDENT_AMBULATORY_CARE_PROVIDER_SITE_OTHER): Payer: Medicare Other | Admitting: Internal Medicine

## 2011-07-10 DIAGNOSIS — E039 Hypothyroidism, unspecified: Secondary | ICD-10-CM

## 2011-07-10 DIAGNOSIS — IMO0002 Reserved for concepts with insufficient information to code with codable children: Secondary | ICD-10-CM

## 2011-07-10 DIAGNOSIS — I10 Essential (primary) hypertension: Secondary | ICD-10-CM

## 2011-07-10 DIAGNOSIS — T6391XA Toxic effect of contact with unspecified venomous animal, accidental (unintentional), initial encounter: Secondary | ICD-10-CM

## 2011-07-10 DIAGNOSIS — I4891 Unspecified atrial fibrillation: Secondary | ICD-10-CM

## 2011-07-10 NOTE — Assessment & Plan Note (Signed)
Continue with current prescription therapy as reflected on the Med list.  

## 2011-07-10 NOTE — Assessment & Plan Note (Signed)
Meds discussed

## 2011-07-10 NOTE — Progress Notes (Signed)
  Subjective:    Patient ID: Ricky [name], male    DOB: 11-28-1922, 77 y.o.   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  HENT: Negative for nosebleeds, congestion, sore throat, sneezing, trouble swallowing and neck pain.   Eyes: Negative for itching and visual disturbance.  Respiratory: Negative for cough.   Cardiovascular: Negative for chest pain, palpitations and leg swelling.  Gastrointestinal: Negative for nausea, diarrhea, blood in stool and abdominal distention.  Genitourinary: Negative for frequency and hematuria.  Musculoskeletal: Negative for back pain, joint swelling and gait problem.  Skin: Negative for rash.  Neurological: Negative for dizziness, tremors, speech difficulty and weakness.  Psychiatric/Behavioral: Negative for sleep disturbance, dysphoric mood and agitation. The patient is not nervous/anxious.        Objective:   Physical Exam  Constitutional: He is oriented to person, place, and time. He appears well-developed.  HENT:  Mouth/Throat: Oropharynx is clear and moist.  Eyes: Conjunctivae are normal. Pupils are equal, round, and reactive to light.  Neck: Normal range of motion. No JVD present. No thyromegaly present.  Cardiovascular: Normal rate, regular rhythm, normal heart sounds and intact distal pulses.  Exam reveals no gallop and no friction rub.   No murmur heard. Pulmonary/Chest: Effort normal and breath sounds normal. No respiratory distress. He has no wheezes. He has no rales. He exhibits no tenderness.  Abdominal: Soft. Bowel sounds are normal. He exhibits no distension and no mass. There is no tenderness. There is no rebound and no guarding.  Musculoskeletal: Normal range of motion. He exhibits no edema and no tenderness.  Lymphadenopathy:   He has no cervical adenopathy.  Neurological: He is alert and oriented to person, place, and time. He has normal reflexes. No cranial nerve deficit. He exhibits normal muscle tone. Coordination normal.  Skin: Skin is warm and dry. No rash noted.  Psychiatric: He has a normal mood and affect. His behavior is normal. Judgment and thought content normal.          Assessment & Plan:   He just had labs at the [place]

## 2011-08-03 ENCOUNTER — Ambulatory Visit (INDEPENDENT_AMBULATORY_CARE_PROVIDER_SITE_OTHER): Payer: Medicare Other | Admitting: *Deleted

## 2011-08-03 DIAGNOSIS — Z7901 Long term (current) use of anticoagulants: Secondary | ICD-10-CM

## 2011-08-03 DIAGNOSIS — IMO0002 Reserved for concepts with insufficient information to code with codable children: Secondary | ICD-10-CM

## 2011-08-03 DIAGNOSIS — I4891 Unspecified atrial fibrillation: Secondary | ICD-10-CM

## 2011-09-14 ENCOUNTER — Ambulatory Visit (INDEPENDENT_AMBULATORY_CARE_PROVIDER_SITE_OTHER): Payer: Medicare Other | Admitting: *Deleted

## 2011-09-14 ENCOUNTER — Encounter: Payer: Medicare Other | Admitting: *Deleted

## 2011-09-14 DIAGNOSIS — Z7901 Long term (current) use of anticoagulants: Secondary | ICD-10-CM

## 2011-09-14 DIAGNOSIS — IMO0002 Reserved for concepts with insufficient information to code with codable children: Secondary | ICD-10-CM

## 2011-09-14 DIAGNOSIS — I4891 Unspecified atrial fibrillation: Secondary | ICD-10-CM

## 2011-10-02 ENCOUNTER — Other Ambulatory Visit: Payer: Self-pay | Admitting: Internal Medicine

## 2011-10-12 ENCOUNTER — Encounter: Payer: Medicare Other | Admitting: *Deleted

## 2011-10-15 ENCOUNTER — Ambulatory Visit (INDEPENDENT_AMBULATORY_CARE_PROVIDER_SITE_OTHER): Payer: Medicare Other | Admitting: *Deleted

## 2011-10-15 DIAGNOSIS — I4891 Unspecified atrial fibrillation: Secondary | ICD-10-CM

## 2011-10-15 DIAGNOSIS — IMO0002 Reserved for concepts with insufficient information to code with codable children: Secondary | ICD-10-CM

## 2011-10-15 DIAGNOSIS — Z7901 Long term (current) use of anticoagulants: Secondary | ICD-10-CM

## 2011-10-15 LAB — POCT INR: INR: 2.2

## 2011-10-22 ENCOUNTER — Other Ambulatory Visit: Payer: Self-pay | Admitting: *Deleted

## 2011-10-23 ENCOUNTER — Other Ambulatory Visit: Payer: Self-pay | Admitting: *Deleted

## 2011-10-23 MED ORDER — IRBESARTAN 300 MG PO TABS: 300.0000 mg | ORAL_TABLET | Freq: Every day | ORAL | Status: DC

## 2011-10-23 NOTE — Progress Notes (Signed)
Rf faxed to [facility] at [phone]

## 2011-11-06 ENCOUNTER — Encounter: Payer: Self-pay | Admitting: Internal Medicine

## 2011-11-06 ENCOUNTER — Ambulatory Visit (INDEPENDENT_AMBULATORY_CARE_PROVIDER_SITE_OTHER): Payer: Medicare Other | Admitting: Internal Medicine

## 2011-11-06 DIAGNOSIS — I442 Atrioventricular block, complete: Secondary | ICD-10-CM

## 2011-11-06 DIAGNOSIS — IMO0002 Reserved for concepts with insufficient information to code with codable children: Secondary | ICD-10-CM

## 2011-11-06 DIAGNOSIS — Z950 Presence of cardiac pacemaker: Secondary | ICD-10-CM | POA: Insufficient documentation

## 2011-11-06 DIAGNOSIS — I10 Essential (primary) hypertension: Secondary | ICD-10-CM

## 2011-11-06 DIAGNOSIS — I4891 Unspecified atrial fibrillation: Secondary | ICD-10-CM

## 2011-11-06 LAB — PACEMAKER DEVICE OBSERVATION
AL IMPEDENCE PM: 393 Ohm
ATRIAL PACING PM: 1
BATTERY VOLTAGE: 2.76 V
BRDY-0002RV: 65 {beats}/min

## 2011-11-06 NOTE — Progress Notes (Signed)
  HPI  Ricky [name] is a 78 y.o. male is seen in followup for atrial fibrillation and a permanent. He is status post AV junction ablation following 2 failed PPIs. He is status post pacemaker implantation and his device dependent.  The patient denies SOB, chest pain, edema or palpitations He is concerned about his blood presssure  Past Medical History  Diagnosis Date  . HYPOTHYROIDISM 09/04/2007  . ANXIETY 04/26/2008  . Essential hypertension, benign 10/03/2007  . LYMPHADENOPATHY 04/06/2009  . TOBACCO USE, QUIT 07/14/2009  . Atrial fibrillation 10/31/2010    Coumadin therapy;  Echo 6/12: EF 50-55%, moderate MR, moderate TR, PASP 52, mild LAE  . Pacemaker     Status post AV nodal ablation    Past Surgical History  Procedure Date  . Pacemaker insertion 10/16/05    Current Outpatient Prescriptions  Medication Sig Dispense Refill  . carvedilol (COREG) 6.25 MG tablet Take 18.75 mg by mouth 2 (two) times daily with a meal.        . Cholecalciferol 1000 UNITS tablet Take 1,000 Units by mouth daily.        . diphenhydrAMINE (BENADRYL) 25 mg capsule Take 1 capsule (25 mg total) by mouth every 4 (four) hours as needed for itching (1-2 for bee stings).  30 capsule  2  . EPINEPHrine (EPIPEN 2-PAK) 0.3 mg/0.3 mL DEVI Inject 0.3 mg into the muscle once as needed.        . furosemide (LASIX) 20 MG tablet TAKE ONE TABLET BY MOUTH EVERY DAY  90 tablet  3  . irbesartan (AVAPRO) 300 MG tablet Take 1 tablet (300 mg total) by mouth at bedtime.  112 tablet  2  . levothyroxine (LEVOTHROID) 88 MCG tablet Take 88 mcg by mouth daily.        . Multiple Vitamin (MULTIVITAMIN) tablet Take 1 tablet by mouth daily.        . nitroGLYCERIN (NITROSTAT) 0.4 MG SL tablet Place 0.4 mg under the tongue every 5 (five) minutes as needed.        . predniSONE (DELTASONE) 10 MG tablet Take 4 tabs prn bee sting       . pseudoephedrine (SUDAFED) 30 MG tablet Take 1 tablet (30 mg total) by mouth every 6 (six) hours as needed  for congestion (take 2 tabs prn bee sting).  24 tablet  2  . warfarin (COUMADIN) 5 MG tablet TAKE AS DIRECTED  90 tablet  1    Allergies  Allergen Reactions  . Amiodarone Hcl   . Amoxicillin   . Atorvastatin   . Benazepril Hcl   . Clonidine Hydrochloride   . Colesevelam   . Diltiazem Hcl   . Ezetimibe     Review of Systems negative except from HPI and PMH  Physical Exam There were no vitals taken for this visit. Well developed and well nourished in no acute distress HENT normal E scleral and icterus clear Neck Supple JVP flat; carotids brisk and full Clear to ausculation Regular rate and rhythm, no murmurs gallops or rub Soft with active bowel sounds No clubbing cyanosis none Edema Alert and oriented, grossly normal motor and sensory function Skin Warm and Dry   Assessment and  Plan

## 2011-11-06 NOTE — Assessment & Plan Note (Signed)
The patient's device was interrogated.  The information was reviewed. No changes were made in the programming.    

## 2011-11-06 NOTE — Assessment & Plan Note (Signed)
Stable

## 2011-11-06 NOTE — Patient Instructions (Addendum)
Your physician wants you to follow-up in: 6 months with Kristin/Paula for a device check & 1 year with Dr. Klein. You will receive a reminder letter in the mail two months in advance. If you don't receive a letter, please call our office to schedule the follow-up appointment.  Your physician recommends that you continue on your current medications as directed. Please refer to the Current Medication list given to you today.  

## 2011-11-06 NOTE — Assessment & Plan Note (Signed)
Status post AV ablation and largely asymptomatic. He is on anticoagulation with warfarin

## 2011-11-06 NOTE — Assessment & Plan Note (Signed)
Blood pressure is elevated. I suggested that he can take an extra dose of carvedilol when this happens.

## 2011-11-08 ENCOUNTER — Telehealth: Payer: Self-pay | Admitting: *Deleted

## 2011-11-08 ENCOUNTER — Other Ambulatory Visit (INDEPENDENT_AMBULATORY_CARE_PROVIDER_SITE_OTHER): Payer: Medicare Other

## 2011-11-08 DIAGNOSIS — E039 Hypothyroidism, unspecified: Secondary | ICD-10-CM

## 2011-11-08 DIAGNOSIS — I4891 Unspecified atrial fibrillation: Secondary | ICD-10-CM

## 2011-11-08 DIAGNOSIS — R799 Abnormal finding of blood chemistry, unspecified: Secondary | ICD-10-CM

## 2011-11-08 LAB — CBC WITH DIFFERENTIAL/PLATELET
Basophils Absolute: 0 10*3/uL (ref 0.0–0.1)
Basophils Relative: 0.3 % (ref 0.0–3.0)
Eosinophils Absolute: 0.2 10*3/uL (ref 0.0–0.7)
Lymphocytes Relative: 29.7 % (ref 12.0–46.0)
MCHC: 33.4 g/dL (ref 30.0–36.0)
Neutrophils Relative %: 56.9 % (ref 43.0–77.0)
Platelets: 218 10*3/uL (ref 150.0–400.0)
RBC: 4.07 Mil/uL — ABNORMAL LOW (ref 4.22–5.81)

## 2011-11-08 LAB — COMPREHENSIVE METABOLIC PANEL
ALT: 20 U/L (ref 0–53)
AST: 26 U/L (ref 0–37)
Albumin: 3.9 g/dL (ref 3.5–5.2)
Alkaline Phosphatase: 67 U/L (ref 39–117)
Calcium: 8.9 mg/dL (ref 8.4–10.5)
Chloride: 101 mEq/L (ref 96–112)
Potassium: 4.9 mEq/L (ref 3.5–5.1)
Sodium: 138 mEq/L (ref 135–145)
Total Protein: 6.5 g/dL (ref 6.0–8.3)

## 2011-11-08 LAB — LIPID PANEL
LDL Cholesterol: 87 mg/dL (ref 0–99)
Total CHOL/HDL Ratio: 2

## 2011-11-08 LAB — TSH: TSH: 0.78 u[IU]/mL (ref 0.35–5.50)

## 2011-11-08 NOTE — Telephone Encounter (Signed)
Received call from lab requesting orders. Per Dr Posey Rea order entered for cbc, lipid, tsh, cmp.

## 2011-11-13 ENCOUNTER — Encounter: Payer: Self-pay | Admitting: Internal Medicine

## 2011-11-13 ENCOUNTER — Ambulatory Visit (INDEPENDENT_AMBULATORY_CARE_PROVIDER_SITE_OTHER): Payer: Medicare Other | Admitting: Internal Medicine

## 2011-11-13 DIAGNOSIS — E039 Hypothyroidism, unspecified: Secondary | ICD-10-CM

## 2011-11-13 DIAGNOSIS — I4891 Unspecified atrial fibrillation: Secondary | ICD-10-CM

## 2011-11-13 DIAGNOSIS — F411 Generalized anxiety disorder: Secondary | ICD-10-CM

## 2011-11-13 DIAGNOSIS — I119 Hypertensive heart disease without heart failure: Secondary | ICD-10-CM

## 2011-11-13 MED ORDER — FUROSEMIDE 20 MG PO TABS: 20.0000 mg | ORAL_TABLET | Freq: Every day | ORAL | Status: DC

## 2011-11-13 MED ORDER — CARVEDILOL 25 MG PO TABS: 25.0000 mg | ORAL_TABLET | Freq: Two times a day (BID) | ORAL | Status: DC

## 2011-11-13 NOTE — Assessment & Plan Note (Signed)
Continue with current prescription therapy as reflected on the Med list.  

## 2011-11-13 NOTE — Progress Notes (Signed)
Patient ID: Ricky [name], male   DOB: 07-17-1923, 78 y.o.   MRN: [id number]  Subjective:    Patient ID: Ricky [name], male    DOB: 03/23/1923, 78 y.o.   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  HENT: Negative for nosebleeds, congestion, sore throat, sneezing, trouble swallowing and neck pain.   Eyes: Negative for itching and visual disturbance.  Respiratory: Negative for cough.   Cardiovascular: Negative for chest pain, palpitations and leg swelling.  Gastrointestinal: Negative for nausea, diarrhea, blood in stool and abdominal distention.  Genitourinary: Negative for frequency and hematuria.  Musculoskeletal: Negative for back pain, joint swelling and gait problem.  Skin: Negative for rash.  Neurological: Negative for dizziness, tremors, speech difficulty and weakness.  Psychiatric/Behavioral: Negative for sleep disturbance, dysphoric mood and agitation. The patient is not nervous/anxious.        Objective:   Physical Exam  Constitutional: He is oriented to person, place, and time. He appears well-developed.  HENT:  Mouth/Throat: Oropharynx is clear and moist.  Eyes: Conjunctivae are normal. Pupils are equal, round, and reactive to light.  Neck: Normal range of motion. No JVD present. No thyromegaly present.  Cardiovascular: Normal rate, regular rhythm, normal heart sounds and intact distal pulses.  Exam reveals no gallop and no friction rub.   No murmur heard. Pulmonary/Chest: Effort normal and breath sounds normal. No  respiratory distress. He has no wheezes. He has no rales. He exhibits no tenderness.  Abdominal: Soft. Bowel sounds are normal. He exhibits no distension and no mass. There is no tenderness. There is no rebound and no guarding.  Musculoskeletal: Normal range of motion. He exhibits no edema and no tenderness.  Lymphadenopathy:    He has no cervical adenopathy.  Neurological: He is alert and oriented to person, place, and time. He has normal reflexes. No cranial nerve deficit. He exhibits normal muscle tone. Coordination normal.  Skin: Skin is warm and dry. No rash noted.  Psychiatric: He has a normal mood and affect. His behavior is normal. Judgment and thought content normal.    Lab Results  Component Value Date   WBC 4.8 11/08/2011   HGB 13.8 11/08/2011   HCT 41.2 11/08/2011   PLT 218.0 11/08/2011   GLUCOSE 66* 11/08/2011   CHOL 164 11/08/2011   TRIG 47.0 11/08/2011   HDL 68.10 11/08/2011   LDLCALC 87 11/08/2011   ALT 20 11/08/2011   AST 26 11/08/2011   NA 138 11/08/2011   K 4.9 11/08/2011   CL 101 11/08/2011   CREATININE 0.8 11/08/2011   BUN 16 11/08/2011   CO2 32 11/08/2011   TSH 0.78 11/08/2011   PSA 0.89 01/04/2011   INR 2.2 10/15/2011         Assessment & Plan:

## 2011-11-13 NOTE — Assessment & Plan Note (Signed)
Increased coreg to 25 mg bid

## 2011-11-26 ENCOUNTER — Ambulatory Visit (INDEPENDENT_AMBULATORY_CARE_PROVIDER_SITE_OTHER): Payer: Medicare Other

## 2011-11-26 DIAGNOSIS — I4891 Unspecified atrial fibrillation: Secondary | ICD-10-CM

## 2011-11-26 DIAGNOSIS — Z7901 Long term (current) use of anticoagulants: Secondary | ICD-10-CM

## 2011-11-30 ENCOUNTER — Encounter: Payer: Self-pay | Admitting: Internal Medicine

## 2011-12-18 ENCOUNTER — Telehealth: Payer: Self-pay | Admitting: Internal Medicine

## 2011-12-18 NOTE — Telephone Encounter (Signed)
New Msg: Pt calling wanting to speak with nurse/MD regarding pt appt in Feb 2013 to get device checked. Please return pt call to discuss further.

## 2011-12-18 NOTE — Telephone Encounter (Signed)
I spoke with the patient. He states that when he saw Dr. Graciela Husbands in February, he thought the [facility]. [employer] rep was the one who interrogated his device. He was told at that time that the "voltage was being lowered" to help the battery last longer. He was told by the rep that he would feel no different. He states that since that time, he has had less energy and tires very easily. He in general is just not feeling as good. I explained I could Kristin/ Gunnar Fusi speak with him. He refuses to speak with anyone but Dr. Graciela Husbands. He wants to know if his device and be reset to what it was. I explained I will forward to Dr. Graciela Husbands to call him, but it may be later this evening before this is done. He is agreeable.

## 2011-12-19 NOTE — Telephone Encounter (Signed)
Can you look and see what we did when he was here and let me know so i can call him  thanks

## 2011-12-20 NOTE — Telephone Encounter (Signed)
Dr. Graciela Husbands spoke with the patient. He explained we could bring him in appointment with the device clinic to recheck his programming to make sure nothing was inadvertantly changed. I will forward to Kristin/ Gunnar Fusi to call and schedule him.

## 2011-12-21 NOTE — Telephone Encounter (Signed)
Pt scheduled for device check on 12-24-11 @ 1600. Pt aware of appt.

## 2011-12-24 ENCOUNTER — Encounter: Payer: Self-pay | Admitting: Internal Medicine

## 2011-12-24 ENCOUNTER — Ambulatory Visit (INDEPENDENT_AMBULATORY_CARE_PROVIDER_SITE_OTHER): Payer: Medicare Other | Admitting: *Deleted

## 2011-12-24 DIAGNOSIS — I442 Atrioventricular block, complete: Secondary | ICD-10-CM

## 2011-12-24 DIAGNOSIS — R0989 Other specified symptoms and signs involving the circulatory and respiratory systems: Secondary | ICD-10-CM

## 2011-12-24 LAB — PACEMAKER DEVICE OBSERVATION
AL IMPEDENCE PM: 369 Ohm
DEVICE MODEL PM: 1647680

## 2012-01-07 ENCOUNTER — Ambulatory Visit (INDEPENDENT_AMBULATORY_CARE_PROVIDER_SITE_OTHER): Payer: Medicare Other | Admitting: *Deleted

## 2012-01-07 DIAGNOSIS — Z7901 Long term (current) use of anticoagulants: Secondary | ICD-10-CM

## 2012-01-07 DIAGNOSIS — I4891 Unspecified atrial fibrillation: Secondary | ICD-10-CM

## 2012-02-18 ENCOUNTER — Ambulatory Visit (INDEPENDENT_AMBULATORY_CARE_PROVIDER_SITE_OTHER): Payer: Medicare Other

## 2012-02-18 DIAGNOSIS — Z7901 Long term (current) use of anticoagulants: Secondary | ICD-10-CM

## 2012-02-18 DIAGNOSIS — I4891 Unspecified atrial fibrillation: Secondary | ICD-10-CM

## 2012-02-20 ENCOUNTER — Ambulatory Visit (INDEPENDENT_AMBULATORY_CARE_PROVIDER_SITE_OTHER): Payer: Medicare Other | Admitting: Internal Medicine

## 2012-02-20 ENCOUNTER — Encounter: Payer: Self-pay | Admitting: Internal Medicine

## 2012-02-20 ENCOUNTER — Other Ambulatory Visit (INDEPENDENT_AMBULATORY_CARE_PROVIDER_SITE_OTHER): Payer: Medicare Other

## 2012-02-20 VITALS — BP 150/72 | HR 80 | Temp 97.8°F | Resp 16 | Wt 156.0 lb

## 2012-02-20 DIAGNOSIS — Z950 Presence of cardiac pacemaker: Secondary | ICD-10-CM

## 2012-02-20 DIAGNOSIS — I4891 Unspecified atrial fibrillation: Secondary | ICD-10-CM

## 2012-02-20 DIAGNOSIS — R209 Unspecified disturbances of skin sensation: Secondary | ICD-10-CM

## 2012-02-20 DIAGNOSIS — N320 Bladder-neck obstruction: Secondary | ICD-10-CM

## 2012-02-20 DIAGNOSIS — Z125 Encounter for screening for malignant neoplasm of prostate: Secondary | ICD-10-CM

## 2012-02-20 DIAGNOSIS — T6391XA Toxic effect of contact with unspecified venomous animal, accidental (unintentional), initial encounter: Secondary | ICD-10-CM

## 2012-02-20 DIAGNOSIS — R202 Paresthesia of skin: Secondary | ICD-10-CM

## 2012-02-20 DIAGNOSIS — E039 Hypothyroidism, unspecified: Secondary | ICD-10-CM

## 2012-02-20 LAB — CBC WITH DIFFERENTIAL/PLATELET
Basophils Absolute: 0 10*3/uL (ref 0.0–0.1)
Eosinophils Absolute: 0.3 10*3/uL (ref 0.0–0.7)
HCT: 40.6 % (ref 39.0–52.0)
Hemoglobin: 13.4 g/dL (ref 13.0–17.0)
Lymphs Abs: 1.7 10*3/uL (ref 0.7–4.0)
MCHC: 33 g/dL (ref 30.0–36.0)
Monocytes Absolute: 0.6 10*3/uL (ref 0.1–1.0)
Monocytes Relative: 10.2 % (ref 3.0–12.0)
Neutro Abs: 3.3 10*3/uL (ref 1.4–7.7)
Platelets: 201 10*3/uL (ref 150.0–400.0)
RDW: 13.4 % (ref 11.5–14.6)

## 2012-02-20 LAB — BASIC METABOLIC PANEL
Calcium: 8.6 mg/dL (ref 8.4–10.5)
GFR: 121.47 mL/min (ref >60.00)
Sodium: 138 mEq/L (ref 135–145)

## 2012-02-20 LAB — VITAMIN B12: Vitamin B-12: 606 pg/mL (ref 211–911)

## 2012-02-20 LAB — PSA: PSA: 0.81 ng/mL (ref 0.10–4.00)

## 2012-02-20 LAB — TSH: TSH: 0.35 u[IU]/mL (ref 0.35–5.50)

## 2012-02-20 LAB — SEDIMENTATION RATE: Sed Rate: 10 mm/hr (ref 0–22)

## 2012-02-20 MED ORDER — PREDNISONE 10 MG PO TABS: ORAL_TABLET | ORAL | Status: DC

## 2012-02-20 MED ORDER — DIPHENHYDRAMINE HCL 25 MG PO CAPS: 25.0000 mg | ORAL_CAPSULE | ORAL | Status: DC | PRN

## 2012-02-20 MED ORDER — PSEUDOEPHEDRINE HCL 30 MG PO TABS: 30.0000 mg | ORAL_TABLET | Freq: Four times a day (QID) | ORAL | Status: DC | PRN

## 2012-02-20 NOTE — Assessment & Plan Note (Signed)
Continue with current prescription therapy as reflected on the Med list.  

## 2012-02-20 NOTE — Assessment & Plan Note (Signed)
Prn meds given ° °

## 2012-02-20 NOTE — Progress Notes (Signed)
  Subjective:    Patient ID: Ricky [name], male    DOB: 1923-02-17, 78 y.o.   MRN: [id number]  HPI  The patient presents for a follow-up of  chronic A fib, hypertension, chronic dyslipidemia, allergy to wasp stings   BP is elevated at times   BP Readings from Last 3 Encounters:  02/20/12 150/72  11/13/11 150/80  11/06/11 162/90   Wt Readings from Last 3 Encounters:  02/20/12 156 lb (70.761 kg)  11/13/11 160 lb (72.576 kg)  11/06/11 158 lb 6.4 oz (71.85 kg)      Review of Systems  Constitutional: Negative for appetite change, fatigue and unexpected weight change.  HENT: Negative for nosebleeds, congestion, sore throat, sneezing, trouble swallowing and neck pain.   Eyes: Negative for itching and visual disturbance.  Respiratory: Negative for cough.   Cardiovascular: Negative for chest pain, palpitations and leg swelling.  Gastrointestinal: Negative for nausea, diarrhea, blood in stool and abdominal distention.  Genitourinary: Negative for frequency and hematuria.  Musculoskeletal: Negative for back pain, joint swelling and gait problem.  Skin: Negative for rash.  Neurological: Negative for dizziness, tremors, speech difficulty and weakness.  Psychiatric/Behavioral: Negative for sleep disturbance, dysphoric mood and agitation. The patient is not nervous/anxious.        Objective:   Physical Exam  Constitutional: He is oriented to person, place, and time. He appears well-developed.  HENT:  Mouth/Throat: Oropharynx is clear and moist.  Eyes: Conjunctivae are normal. Pupils are equal, round, and reactive to light.  Neck: Normal range of motion. No JVD present. No thyromegaly present.  Cardiovascular: Normal rate, regular rhythm, normal heart sounds and intact distal pulses.  Exam reveals no gallop and no friction rub.   No murmur heard. Pulmonary/Chest: Effort normal and breath sounds normal. No respiratory distress. He has no wheezes. He has no rales. He exhibits no  tenderness.  Abdominal: Soft. Bowel sounds are normal. He exhibits no distension and no mass. There is no tenderness. There is no rebound and no guarding.  Musculoskeletal: Normal range of motion. He exhibits no edema and no tenderness.  Lymphadenopathy:    He has no cervical adenopathy.  Neurological: He is alert and oriented to person, place, and time. He has normal reflexes. No cranial nerve deficit. He exhibits normal muscle tone. Coordination normal.  Skin: Skin is warm and dry. No rash noted.  Psychiatric: He has a normal mood and affect. His behavior is normal. Judgment and thought content normal.    Lab Results  Component Value Date   WBC 4.8 11/08/2011   HGB 13.8 11/08/2011   HCT 41.2 11/08/2011   PLT 218.0 11/08/2011   GLUCOSE 66* 11/08/2011   CHOL 164 11/08/2011   TRIG 47.0 11/08/2011   HDL 68.10 11/08/2011   LDLCALC 87 11/08/2011   ALT 20 11/08/2011   AST 26 11/08/2011   NA 138 11/08/2011   K 4.9 11/08/2011   CL 101 11/08/2011   CREATININE 0.8 11/08/2011   BUN 16 11/08/2011   CO2 32 11/08/2011   TSH 0.78 11/08/2011   PSA 0.89 01/04/2011   INR 2.3 02/18/2012         Assessment & Plan:

## 2012-02-20 NOTE — Assessment & Plan Note (Signed)
Doing well 

## 2012-03-31 ENCOUNTER — Ambulatory Visit (INDEPENDENT_AMBULATORY_CARE_PROVIDER_SITE_OTHER): Payer: Medicare Other

## 2012-03-31 DIAGNOSIS — Z7901 Long term (current) use of anticoagulants: Secondary | ICD-10-CM

## 2012-03-31 DIAGNOSIS — I4891 Unspecified atrial fibrillation: Secondary | ICD-10-CM

## 2012-03-31 LAB — POCT INR: INR: 4

## 2012-04-01 ENCOUNTER — Other Ambulatory Visit: Payer: Self-pay | Admitting: Internal Medicine

## 2012-04-02 ENCOUNTER — Telehealth: Payer: Self-pay | Admitting: Internal Medicine

## 2012-04-28 ENCOUNTER — Ambulatory Visit (INDEPENDENT_AMBULATORY_CARE_PROVIDER_SITE_OTHER): Payer: Medicare Other | Admitting: *Deleted

## 2012-04-28 DIAGNOSIS — Z7901 Long term (current) use of anticoagulants: Secondary | ICD-10-CM

## 2012-04-28 DIAGNOSIS — I4891 Unspecified atrial fibrillation: Secondary | ICD-10-CM

## 2012-05-05 ENCOUNTER — Ambulatory Visit (INDEPENDENT_AMBULATORY_CARE_PROVIDER_SITE_OTHER): Payer: Medicare Other | Admitting: *Deleted

## 2012-05-05 ENCOUNTER — Encounter: Payer: Self-pay | Admitting: Internal Medicine

## 2012-05-05 DIAGNOSIS — I442 Atrioventricular block, complete: Secondary | ICD-10-CM

## 2012-05-05 DIAGNOSIS — I4891 Unspecified atrial fibrillation: Secondary | ICD-10-CM

## 2012-05-05 LAB — PACEMAKER DEVICE OBSERVATION
AL AMPLITUDE: 5 mv
AL IMPEDENCE PM: 374 Ohm
BAMS-0001: 160 {beats}/min
BATTERY VOLTAGE: 2.75 V
RV LEAD THRESHOLD: 0.375 V
VENTRICULAR PACING PM: 99

## 2012-05-05 NOTE — Progress Notes (Signed)
Pacer check in clinic  

## 2012-05-17 ENCOUNTER — Other Ambulatory Visit: Payer: Self-pay | Admitting: Internal Medicine

## 2012-05-21 ENCOUNTER — Encounter: Payer: Self-pay | Admitting: Pharmacist

## 2012-06-03 ENCOUNTER — Ambulatory Visit (INDEPENDENT_AMBULATORY_CARE_PROVIDER_SITE_OTHER): Payer: Medicare Other | Admitting: General Practice

## 2012-06-03 DIAGNOSIS — Z7901 Long term (current) use of anticoagulants: Secondary | ICD-10-CM

## 2012-06-03 DIAGNOSIS — I4891 Unspecified atrial fibrillation: Secondary | ICD-10-CM

## 2012-06-03 LAB — POCT INR: INR: 2.3

## 2012-06-25 ENCOUNTER — Ambulatory Visit (INDEPENDENT_AMBULATORY_CARE_PROVIDER_SITE_OTHER): Payer: Medicare Other | Admitting: Internal Medicine

## 2012-06-25 ENCOUNTER — Encounter: Payer: Self-pay | Admitting: Internal Medicine

## 2012-06-25 VITALS — BP 146/80 | HR 80 | Resp 16 | Wt 156.0 lb

## 2012-06-25 DIAGNOSIS — N320 Bladder-neck obstruction: Secondary | ICD-10-CM

## 2012-06-25 DIAGNOSIS — G245 Blepharospasm: Secondary | ICD-10-CM

## 2012-06-25 DIAGNOSIS — E039 Hypothyroidism, unspecified: Secondary | ICD-10-CM

## 2012-06-25 DIAGNOSIS — Z23 Encounter for immunization: Secondary | ICD-10-CM

## 2012-06-25 DIAGNOSIS — F411 Generalized anxiety disorder: Secondary | ICD-10-CM

## 2012-06-25 DIAGNOSIS — I4891 Unspecified atrial fibrillation: Secondary | ICD-10-CM

## 2012-06-25 MED ORDER — IRBESARTAN 300 MG PO TABS: 300.0000 mg | ORAL_TABLET | Freq: Every day | ORAL | Status: DC

## 2012-06-25 NOTE — Progress Notes (Signed)
   Subjective:    Patient ID: Ricky [name], male    DOB: 10-Jun-1923, 78 y.o.   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  HENT: Negative for nosebleeds, congestion, sore throat, sneezing, trouble swallowing and neck pain.   Eyes: Negative for itching and visual disturbance.  Respiratory: Negative for cough.   Cardiovascular: Negative for chest pain, palpitations and leg swelling.  Gastrointestinal: Negative for nausea, diarrhea, blood in stool and abdominal distention.  Genitourinary: Negative for frequency and hematuria.  Musculoskeletal: Negative for back pain, joint swelling and gait problem.  Skin: Negative for rash.  Neurological: Negative for dizziness, tremors, speech difficulty and weakness.  Psychiatric/Behavioral: Negative for disturbed wake/sleep cycle, dysphoric mood and agitation. The patient is not nervous/anxious.        Objective:   Physical Exam  Constitutional: He is oriented to person, place, and time. He appears well-developed.  HENT:  Mouth/Throat: Oropharynx is clear and moist.  Eyes: Conjunctivae normal are normal. Pupils are equal, round, and reactive to light.  Neck: Normal range of motion. No JVD present. No thyromegaly present.  Cardiovascular: Normal rate, regular rhythm, normal heart sounds and intact distal pulses.  Exam reveals no gallop and no friction rub.   No murmur heard. Pulmonary/Chest: Effort normal and breath sounds normal. No respiratory distress. He has no wheezes. He has  no rales. He exhibits no tenderness.  Abdominal: Soft. Bowel sounds are normal. He exhibits no distension and no mass. There is no tenderness. There is no rebound and no guarding.  Musculoskeletal: Normal range of motion. He exhibits no edema and no tenderness.  Lymphadenopathy:    He has no cervical adenopathy.  Neurological: He is alert and oriented to person, place, and time. He has normal reflexes. No cranial nerve deficit. He exhibits normal muscle tone. Coordination normal.  Skin: Skin is warm and dry. No rash noted.  Psychiatric: He has a normal mood and affect. His behavior is normal. Judgment and thought content normal.    Lab Results  Component Value Date   WBC 5.8 02/20/2012   HGB 13.4 02/20/2012   HCT 40.6 02/20/2012   PLT 201.0 02/20/2012   GLUCOSE 69* 02/20/2012   CHOL 164 11/08/2011   TRIG 47.0 11/08/2011   HDL 68.10 11/08/2011   LDLCALC 87 11/08/2011   ALT 20 11/08/2011   AST 26 11/08/2011   NA 138 02/20/2012   K 4.8 02/20/2012   CL 100 02/20/2012   CREATININE 0.7 02/20/2012   BUN 18 02/20/2012   CO2 31 02/20/2012   TSH 0.35 02/20/2012   PSA 0.81 02/20/2012   INR 2.3 06/03/2012         Assessment & Plan:

## 2012-06-25 NOTE — Assessment & Plan Note (Signed)
Rate controlled 

## 2012-06-25 NOTE — Assessment & Plan Note (Signed)
He needs Botox inj  Neurol cons w/Dr Tat

## 2012-06-25 NOTE — Assessment & Plan Note (Signed)
Continue with current prescription therapy as reflected on the Med list.  

## 2012-06-25 NOTE — Assessment & Plan Note (Signed)
Not on Rx 

## 2012-06-26 ENCOUNTER — Encounter: Payer: Self-pay | Admitting: Internal Medicine

## 2012-07-15 ENCOUNTER — Ambulatory Visit (INDEPENDENT_AMBULATORY_CARE_PROVIDER_SITE_OTHER): Payer: Medicare Other | Admitting: Internal Medicine

## 2012-07-15 ENCOUNTER — Ambulatory Visit (INDEPENDENT_AMBULATORY_CARE_PROVIDER_SITE_OTHER): Payer: Medicare Other | Admitting: General Practice

## 2012-07-15 ENCOUNTER — Encounter: Payer: Self-pay | Admitting: Internal Medicine

## 2012-07-15 VITALS — BP 120/80 | HR 80 | Temp 97.6°F | Resp 16 | Wt 156.0 lb

## 2012-07-15 DIAGNOSIS — I4891 Unspecified atrial fibrillation: Secondary | ICD-10-CM

## 2012-07-15 DIAGNOSIS — S61409A Unspecified open wound of unspecified hand, initial encounter: Secondary | ICD-10-CM | POA: Insufficient documentation

## 2012-07-15 DIAGNOSIS — Z7901 Long term (current) use of anticoagulants: Secondary | ICD-10-CM

## 2012-07-15 LAB — POCT INR: INR: 2.2

## 2012-07-15 NOTE — Assessment & Plan Note (Signed)
10/13 L hand laceration 1 wk old DT is up to date Wound was dressed, instructions given

## 2012-07-15 NOTE — Progress Notes (Signed)
Patient ID: Ricky [name], male   DOB: 09-04-1923, 78 y.o.   MRN: [id number]   Subjective:    Patient ID: Ricky [name], male    DOB: Mar 23, 1923, 78 y.o.   MRN: [id number]  Hand Injury  Pertinent negatives include no chest pain.  He cut it 1 wk ago against the railing  The patient presents for a follow-up of  chronic A fib, hypertension, chronic dyslipidemia, allergy to wasp stings   F/u blepharospasm BP is elevated at times   BP Readings from Last 3 Encounters:  07/15/12 120/80  06/25/12 146/80  02/20/12 150/72   Wt Readings from Last 3 Encounters:  07/15/12 156 lb (70.761 kg)  06/25/12 156 lb (70.761 kg)  02/20/12 156 lb (70.761 kg)      Review of Systems  Constitutional: Negative for appetite change, fatigue and unexpected weight change.  HENT: Negative for nosebleeds, congestion, sore throat, sneezing, trouble swallowing and neck pain.   Eyes: Negative for itching and visual disturbance.  Respiratory: Negative for cough.   Cardiovascular: Negative for chest pain, palpitations and leg swelling.  Gastrointestinal: Negative for nausea, diarrhea, blood in stool and abdominal distention.  Genitourinary: Negative for frequency and hematuria.  Musculoskeletal: Negative for back pain, joint swelling and gait problem.  Skin: Negative for rash.  Neurological: Negative for dizziness, tremors, speech difficulty and weakness.  Psychiatric/Behavioral: Negative for disturbed wake/sleep cycle, dysphoric mood and agitation. The patient is not nervous/anxious.        Objective:   Physical Exam  Constitutional: He is oriented to person, place, and time. He appears well-developed.  HENT:  Mouth/Throat: Oropharynx is clear and moist.  Eyes: Conjunctivae normal are normal. Pupils are equal, round, and reactive to light.  Neck: Normal range of motion. No JVD present. No thyromegaly present.  Cardiovascular: Normal rate, regular rhythm, normal heart sounds and intact distal pulses.   Exam reveals no gallop and no friction rub.   No murmur heard. Pulmonary/Chest: Effort normal and breath sounds normal. No respiratory distress. He has no wheezes. He has no rales. He exhibits no tenderness.  Abdominal: Soft. Bowel sounds are normal. He exhibits no distension and no mass. There is no tenderness. There is no rebound and no guarding.  Musculoskeletal: Normal range of motion. He exhibits no edema and no tenderness.  Lymphadenopathy:    He has no cervical adenopathy.  Neurological: He is alert and oriented to person, place, and time. He has normal reflexes. No cranial nerve deficit. He exhibits normal muscle tone. Coordination normal.  Skin: Skin is warm and dry. No rash noted.  Psychiatric: He has a normal mood and affect. His behavior is normal. Judgment and thought content normal.   L ulnar palm 2.5x0.3 cm granulating laceration Lab Results  Component Value Date   WBC 5.8 02/20/2012   HGB 13.4 02/20/2012   HCT 40.6 02/20/2012   PLT 201.0 02/20/2012   GLUCOSE 69* 02/20/2012   CHOL 164 11/08/2011   TRIG 47.0 11/08/2011   HDL 68.10 11/08/2011   LDLCALC 87 11/08/2011   ALT 20 11/08/2011   AST 26 11/08/2011   NA 138 02/20/2012   K 4.8 02/20/2012   CL 100 02/20/2012   CREATININE 0.7 02/20/2012   BUN 18 02/20/2012   CO2 31 02/20/2012   TSH 0.35 02/20/2012   PSA 0.81 02/20/2012   INR 2.2 07/15/2012         Assessment & Plan:

## 2012-07-28 ENCOUNTER — Encounter: Payer: Self-pay | Admitting: Internal Medicine

## 2012-08-26 ENCOUNTER — Ambulatory Visit (INDEPENDENT_AMBULATORY_CARE_PROVIDER_SITE_OTHER): Payer: Medicare Other | Admitting: General Practice

## 2012-08-26 DIAGNOSIS — Z7901 Long term (current) use of anticoagulants: Secondary | ICD-10-CM

## 2012-08-26 DIAGNOSIS — I4891 Unspecified atrial fibrillation: Secondary | ICD-10-CM

## 2012-08-26 LAB — POCT INR: INR: 2.5

## 2012-09-27 ENCOUNTER — Other Ambulatory Visit: Payer: Self-pay | Admitting: Internal Medicine

## 2012-09-29 ENCOUNTER — Other Ambulatory Visit: Payer: Self-pay | Admitting: General Practice

## 2012-09-29 MED ORDER — WARFARIN SODIUM 5 MG PO TABS: 5.0000 mg | ORAL_TABLET | Freq: Every day | ORAL | Status: DC

## 2012-10-07 ENCOUNTER — Ambulatory Visit (INDEPENDENT_AMBULATORY_CARE_PROVIDER_SITE_OTHER): Payer: Medicare Other | Admitting: General Practice

## 2012-10-07 DIAGNOSIS — I4891 Unspecified atrial fibrillation: Secondary | ICD-10-CM

## 2012-10-07 DIAGNOSIS — Z7901 Long term (current) use of anticoagulants: Secondary | ICD-10-CM

## 2012-10-07 LAB — POCT INR: INR: 2.2

## 2012-10-22 ENCOUNTER — Encounter: Payer: Self-pay | Admitting: Internal Medicine

## 2012-10-22 ENCOUNTER — Ambulatory Visit (INDEPENDENT_AMBULATORY_CARE_PROVIDER_SITE_OTHER): Payer: Medicare Other | Admitting: Internal Medicine

## 2012-10-22 ENCOUNTER — Emergency Department (HOSPITAL_COMMUNITY): Payer: Medicare Other

## 2012-10-22 ENCOUNTER — Observation Stay (HOSPITAL_COMMUNITY)
Admission: EM | Admit: 2012-10-22 | Discharge: 2012-10-24 | Disposition: A | Discharge status: Home / Self Care | Payer: Medicare Other | Attending: Internal Medicine | Admitting: Internal Medicine

## 2012-10-22 ENCOUNTER — Telehealth: Payer: Self-pay | Admitting: Internal Medicine

## 2012-10-22 ENCOUNTER — Encounter (HOSPITAL_COMMUNITY): Payer: Self-pay | Admitting: *Deleted

## 2012-10-22 VITALS — BP 140/90 | HR 64 | Temp 96.4°F | Resp 16 | Wt 166.0 lb

## 2012-10-22 DIAGNOSIS — I509 Heart failure, unspecified: Secondary | ICD-10-CM

## 2012-10-22 DIAGNOSIS — J9600 Acute respiratory failure, unspecified whether with hypoxia or hypercapnia: Principal | ICD-10-CM | POA: Insufficient documentation

## 2012-10-22 DIAGNOSIS — I5033 Acute on chronic diastolic (congestive) heart failure: Secondary | ICD-10-CM

## 2012-10-22 DIAGNOSIS — R0902 Hypoxemia: Secondary | ICD-10-CM

## 2012-10-22 DIAGNOSIS — E039 Hypothyroidism, unspecified: Secondary | ICD-10-CM

## 2012-10-22 DIAGNOSIS — M7989 Other specified soft tissue disorders: Secondary | ICD-10-CM | POA: Insufficient documentation

## 2012-10-22 DIAGNOSIS — I119 Hypertensive heart disease without heart failure: Secondary | ICD-10-CM | POA: Diagnosis present

## 2012-10-22 DIAGNOSIS — I4821 Permanent atrial fibrillation: Secondary | ICD-10-CM | POA: Diagnosis present

## 2012-10-22 DIAGNOSIS — J069 Acute upper respiratory infection, unspecified: Secondary | ICD-10-CM

## 2012-10-22 DIAGNOSIS — I110 Hypertensive heart disease with heart failure: Secondary | ICD-10-CM | POA: Insufficient documentation

## 2012-10-22 DIAGNOSIS — Z950 Presence of cardiac pacemaker: Secondary | ICD-10-CM | POA: Insufficient documentation

## 2012-10-22 DIAGNOSIS — F411 Generalized anxiety disorder: Secondary | ICD-10-CM | POA: Diagnosis present

## 2012-10-22 DIAGNOSIS — M6281 Muscle weakness (generalized): Secondary | ICD-10-CM | POA: Insufficient documentation

## 2012-10-22 DIAGNOSIS — M549 Dorsalgia, unspecified: Secondary | ICD-10-CM | POA: Insufficient documentation

## 2012-10-22 DIAGNOSIS — I4891 Unspecified atrial fibrillation: Secondary | ICD-10-CM

## 2012-10-22 DIAGNOSIS — Z7901 Long term (current) use of anticoagulants: Secondary | ICD-10-CM | POA: Insufficient documentation

## 2012-10-22 DIAGNOSIS — R630 Anorexia: Secondary | ICD-10-CM | POA: Insufficient documentation

## 2012-10-22 LAB — COMPREHENSIVE METABOLIC PANEL
Albumin: 3.6 g/dL (ref 3.5–5.2)
BUN: 15 mg/dL (ref 6–23)
Calcium: 9.2 mg/dL (ref 8.4–10.5)
Creatinine, Ser: 0.67 mg/dL (ref 0.50–1.35)
GFR calc Af Amer: >90 mL/min (ref >90)
Total Protein: 7.2 g/dL (ref 6.0–8.3)

## 2012-10-22 LAB — TROPONIN I: Troponin I: <0.3 ng/mL (ref <0.30)

## 2012-10-22 LAB — PROTIME-INR
INR: 2.28 — ABNORMAL HIGH (ref 0.00–1.49)
Prothrombin Time: 24.1 s — ABNORMAL HIGH (ref 11.6–15.2)

## 2012-10-22 LAB — CBC WITH DIFFERENTIAL/PLATELET
Basophils Absolute: 0 10*3/uL (ref 0.0–0.1)
Basophils Relative: 0 % (ref 0–1)
Eosinophils Absolute: 0.1 10*3/uL (ref 0.0–0.7)
MCH: 33.6 pg (ref 26.0–34.0)
MCHC: 33.7 g/dL (ref 30.0–36.0)
Neutro Abs: 4.6 10*3/uL (ref 1.7–7.7)
Neutrophils Relative %: 62 % (ref 43–77)
Platelets: 220 10*3/uL (ref 150–400)
RDW: 13.1 % (ref 11.5–15.5)

## 2012-10-22 LAB — PRO B NATRIURETIC PEPTIDE: Pro B Natriuretic peptide (BNP): 1735 pg/mL — ABNORMAL HIGH (ref 0–450)

## 2012-10-22 LAB — TSH: TSH: 0.825 u[IU]/mL (ref 0.350–4.500)

## 2012-10-22 LAB — URINALYSIS, ROUTINE W REFLEX MICROSCOPIC
Bilirubin Urine: NEGATIVE
Glucose, UA: NEGATIVE mg/dL
Hgb urine dipstick: NEGATIVE
Ketones, ur: NEGATIVE mg/dL
Leukocytes, UA: NEGATIVE
Nitrite: NEGATIVE
Protein, ur: NEGATIVE mg/dL
Specific Gravity, Urine: 1.008 (ref 1.005–1.030)
Urobilinogen, UA: 0.2 mg/dL (ref 0.0–1.0)
pH: 8 (ref 5.0–8.0)

## 2012-10-22 MED ORDER — ONDANSETRON HCL 4 MG/2ML IJ SOLN: 4.0000 mg | Freq: Four times a day (QID) | INTRAMUSCULAR | Status: DC | PRN

## 2012-10-22 MED ORDER — LEVOTHYROXINE SODIUM 75 MCG PO TABS
75.0000 ug | ORAL_TABLET | Freq: Every day | ORAL | Status: DC
  Administered 2012-10-23 – 2012-10-24 (×2): 75 ug via ORAL
  Filled 2012-10-22 (×2): qty 1

## 2012-10-22 MED ORDER — FUROSEMIDE 10 MG/ML IJ SOLN
40.0000 mg | Freq: Two times a day (BID) | INTRAMUSCULAR | Status: DC
  Administered 2012-10-23 – 2012-10-24 (×4): 40 mg via INTRAVENOUS
  Filled 2012-10-22 (×5): qty 4

## 2012-10-22 MED ORDER — ACETAMINOPHEN 325 MG PO TABS: 650.0000 mg | ORAL_TABLET | ORAL | Status: DC | PRN

## 2012-10-22 MED ORDER — SODIUM CHLORIDE 0.9 % IJ SOLN: 3.0000 mL | INTRAMUSCULAR | Status: DC | PRN

## 2012-10-22 MED ORDER — DIPHENHYDRAMINE HCL 25 MG PO CAPS: 25.0000 mg | ORAL_CAPSULE | ORAL | Status: DC | PRN

## 2012-10-22 MED ORDER — CHOLECALCIFEROL 25 MCG (1000 UT) PO TABS: 1000.0000 [IU] | ORAL_TABLET | Freq: Every day | ORAL | Status: DC

## 2012-10-22 MED ORDER — WARFARIN SODIUM 5 MG PO TABS: 5.0000 mg | ORAL_TABLET | Freq: Every day | ORAL | Status: DC

## 2012-10-22 MED ORDER — VITAMIN D3 25 MCG (1000 UNIT) PO TABS
1000.0000 [IU] | ORAL_TABLET | Freq: Every day | ORAL | Status: DC
  Administered 2012-10-22 – 2012-10-24 (×3): 1000 [IU] via ORAL
  Filled 2012-10-22 (×3): qty 1

## 2012-10-22 MED ORDER — SODIUM CHLORIDE 0.9 % IV SOLN: 250.0000 mL | INTRAVENOUS | Status: DC | PRN

## 2012-10-22 MED ORDER — SODIUM CHLORIDE 0.9 % IJ SOLN
3.0000 mL | Freq: Two times a day (BID) | INTRAMUSCULAR | Status: DC
  Administered 2012-10-22 – 2012-10-24 (×4): 3 mL via INTRAVENOUS

## 2012-10-22 MED ORDER — FUROSEMIDE 10 MG/ML IJ SOLN
40.0000 mg | Freq: Once | INTRAMUSCULAR | Status: AC
  Administered 2012-10-22: 40 mg via INTRAVENOUS
  Filled 2012-10-22: qty 4

## 2012-10-22 MED ORDER — ONE-DAILY MULTI VITAMINS PO TABS: 1.0000 | ORAL_TABLET | Freq: Every day | ORAL | Status: DC

## 2012-10-22 MED ORDER — IRBESARTAN 300 MG PO TABS
300.0000 mg | ORAL_TABLET | Freq: Every day | ORAL | Status: DC
  Administered 2012-10-22 – 2012-10-23 (×2): 300 mg via ORAL
  Filled 2012-10-22 (×3): qty 1

## 2012-10-22 MED ORDER — ONDANSETRON HCL 4 MG/2ML IJ SOLN: 4.0000 mg | Freq: Three times a day (TID) | INTRAMUSCULAR | Status: DC | PRN

## 2012-10-22 MED ORDER — ADULT MULTIVITAMIN W/MINERALS CH
1.0000 | ORAL_TABLET | Freq: Every day | ORAL | Status: DC
  Administered 2012-10-22 – 2012-10-24 (×3): 1 via ORAL
  Filled 2012-10-22 (×3): qty 1

## 2012-10-22 MED ORDER — CARVEDILOL 25 MG PO TABS
25.0000 mg | ORAL_TABLET | Freq: Two times a day (BID) | ORAL | Status: DC
  Administered 2012-10-22 – 2012-10-24 (×4): 25 mg via ORAL
  Filled 2012-10-22 (×5): qty 1

## 2012-10-22 NOTE — ED Provider Notes (Signed)
History     CSN: [id number]  Arrival date & time 10/22/12  1223   First MD Initiated Contact with Patient 10/22/12 1303      Chief Complaint  Patient presents with  . Shortness of Breath  . Cough    (Consider location/radiation/quality/duration/timing/severity/associated sxs/prior treatment) HPI Comments: Patient presents with 5 days of URI type symptoms with dry cough unrelieved by over-the-counter medicines. Saw PCP today and referred to ED for apparent new onset CHF. He has a history of atrial fibrillation status post pacemaker on Coumadin. Denies any chest pain, fever chills. Says his breathing is difficult worse with exertion. He is unable to lay flat for the past 4 days. She's noticed increasing leg swelling over the past week as well. States she's been compliant with his home Lasix. Good by mouth intake and urine output.  The history is provided by the patient.    Past Medical History  Diagnosis Date  . HYPOTHYROIDISM 09/04/2007  . ANXIETY 04/26/2008  . Essential hypertension, benign 10/03/2007  . LYMPHADENOPATHY 04/06/2009  . TOBACCO USE, QUIT 07/14/2009  . Atrial fibrillation 10/31/2010    Coumadin therapy;  Echo 6/12: EF 50-55%, moderate MR, moderate TR, PASP 52, mild LAE  . Pacemaker     Status post AV nodal ablation    Past Surgical History  Procedure Date  . Pacemaker insertion 10/16/05    Family History  Problem Relation Age of Onset  . Hypertension Other   . Hypertension Mother   . Heart disease Father     History  Substance Use Topics  . Smoking status: Former Games developer  . Smokeless tobacco: Not on file  . Alcohol Use: No      Review of Systems  Constitutional: Positive for activity change, appetite change and fatigue. Negative for fever.  HENT: Negative for congestion and rhinorrhea.   Respiratory: Positive for cough and shortness of breath.   Cardiovascular: Positive for leg swelling. Negative for chest pain.  Gastrointestinal: Negative for nausea,  vomiting and abdominal pain.  Genitourinary: Negative for dysuria and hematuria.  Musculoskeletal: Positive for back pain and joint swelling.  Skin: Negative for rash.  Neurological: Positive for weakness. Negative for dizziness and headaches.  A complete 10 system review of systems was obtained and all systems are negative except as noted in the HPI and PMH.    Allergies  Amiodarone hcl; Amoxicillin; Atorvastatin; Benazepril hcl; Clonidine hydrochloride; Colesevelam; Diltiazem hcl; and Ezetimibe  Home Medications   Current Outpatient Rx  Name  Route  Sig  Dispense  Refill  . CARVEDILOL 25 MG PO TABS   Oral   Take 1 tablet (25 mg total) by mouth 2 (two) times daily.   180 tablet   3   . CHOLECALCIFEROL 1000 UNITS PO TABS   Oral   Take 1,000 Units by mouth daily.           Marland Kitchen EPINEPHRINE 0.3 MG/0.3ML IJ DEVI   Intramuscular   Inject 0.3 mg into the muscle once as needed. For allergic reaction         . FUROSEMIDE 20 MG PO TABS   Oral   Take 1 tablet (20 mg total) by mouth daily.   90 tablet   3   . IRBESARTAN 300 MG PO TABS   Oral   Take 1 tablet (300 mg total) by mouth at bedtime.   90 tablet   3   . LEVOTHYROXINE SODIUM 75 MCG PO TABS   Oral   Take 75  mcg by mouth daily.         Marland Kitchen ONE-DAILY MULTI VITAMINS PO TABS   Oral   Take 1 tablet by mouth daily.           . WARFARIN SODIUM 5 MG PO TABS   Oral   Take 1 tablet (5 mg total) by mouth daily. Take as directed by coumadin clinic.   90 tablet   1     90 day supply   . DIPHENHYDRAMINE HCL 25 MG PO CAPS   Oral   Take 1 capsule (25 mg total) by mouth every 4 (four) hours as needed for itching (1-2 for bee stings).   30 capsule   2   . NITROGLYCERIN 0.4 MG SL SUBL   Sublingual   Place 0.4 mg under the tongue every 5 (five) minutes as needed. For chest pain           BP 169/81  Pulse 65  Temp 97.9 F (36.6 C) (Oral)  Resp 27  SpO2 97%  Physical Exam  Constitutional: He is oriented to  person, place, and time. He appears well-developed and well-nourished. No distress.  HENT:  Head: Normocephalic and atraumatic.  Mouth/Throat: Oropharynx is clear and moist. No oropharyngeal exudate.  Eyes: Conjunctivae normal and EOM are normal. Pupils are equal, round, and reactive to light.  Neck: Normal range of motion. Neck supple.  Cardiovascular: Normal rate, regular rhythm and normal heart sounds.   No murmur heard. Pulmonary/Chest: Effort normal. No respiratory distress. He has rales.       Bibasilar rales  Abdominal: Soft. There is no tenderness. There is no rebound and no guarding.  Musculoskeletal: Normal range of motion. He exhibits edema.       + 2 pitting edema to mid tibia bilaterally Intact DP and PT pulses  Neurological: He is alert and oriented to person, place, and time. No cranial nerve deficit. He exhibits normal muscle tone. Coordination normal.  Skin: Skin is warm.    ED Course  Procedures (including critical care time)  Labs Reviewed  CBC WITH DIFFERENTIAL - Abnormal; Notable for the following:    RBC 4.17 (*)     All other components within normal limits  COMPREHENSIVE METABOLIC PANEL - Abnormal; Notable for the following:    Glucose, Bld 103 (*)     GFR calc non Af Amer 84 (*)     All other components within normal limits  PRO B NATRIURETIC PEPTIDE - Abnormal; Notable for the following:    Pro B Natriuretic peptide (BNP) 1735.0 (*)     All other components within normal limits  PROTIME-INR - Abnormal; Notable for the following:    Prothrombin Time 24.1 (*)     INR 2.28 (*)     All other components within normal limits  TROPONIN I  URINALYSIS, ROUTINE W REFLEX MICROSCOPIC  POCT I-STAT TROPONIN I  TSH  INFLUENZA PANEL BY PCR   Dg Chest 2 View  10/22/2012  *RADIOLOGY REPORT*  Clinical Data: Shortness of breath.  Cough.  Upper respiratory tract symptoms.  CHEST - 2 VIEW  Comparison: Two-view chest 02/03/2011.  Findings: The heart size is normal.   Emphysematous changes are again noted.  No focal airspace consolidation is evident.  Scarring and cephalization of the hila bilaterally is stable.  Pacing wires are stable.  The visualized soft tissues and bony thorax are unremarkable.  A fracture of the right humeral head is stable.  IMPRESSION:  1.  Emphysema. 2.  No acute cardiopulmonary disease or significant interval change.   Original Report Authenticated By: Marin Roberts, M.D.      No diagnosis found.    MDM  5 days of breath was URI type symptoms with dry nonproductive cough and shortness of breath. Worsening leg swelling and breathing on exertion. PCP sent the ED today for concern for CHF. Denies any chest pain or fever.  No distress, bibasilar rhonchi, chest x-ray clear. Mild lower extremity edema. INR therapeutic at 2.3.  Question diastolic heart failure exacerbation as patient had normal EF in June 2013. BNP 1500. PE less likely given patient's therapeutic Coumadin. Doubt ACS.  Patient in no distress satting in the high 90s at rest however after exertion he drops to the mid 70s. Admission for hypoxia on exertion and diaphoresis. Flu swab sent.     Date: 10/22/2012  Rate: 72  Rhythm: paced  QRS Axis: normal  Intervals: normal  ST/T Wave abnormalities: normal  Conduction Disutrbances:none  Narrative Interpretation:   Old EKG Reviewed: unchanged    Glynn Octave, MD 10/22/12 [phone]

## 2012-10-22 NOTE — Patient Instructions (Signed)
Heart Failure  Heart failure (HF) is a condition in which the heart has trouble pumping blood. This means your heart does not pump blood efficiently for your body to work well. In some cases of HF, fluid may back up into your lungs or you may have swelling (edema) in your lower legs. HF is a long-term (chronic) condition. It is important for you to take good care of yourself and follow your caregiver's treatment plan.  CAUSES   · Health conditions:  · High blood pressure (hypertension) causes the heart muscle to work harder than normal. When pressure in the blood vessels is high, the heart needs to pump (contract) with more force in order to circulate blood throughout the body. High blood pressure eventually causes the heart to become stiff and weak.  · Coronary artery disease (CAD) is the buildup of cholesterol and fat (plaques) in the arteries of the heart. The blockage in the arteries deprives the heart muscle of oxygen and blood. This can cause chest pain and may lead to a heart attack. High blood pressure can also contribute to CAD.  · Heart attack (myocardial infarction) occurs when 1 or more arteries in the heart become blocked. The loss of oxygen damages the muscle tissue of the heart. When this happens, part of the heart muscle dies. The injured tissue does not contract as well and weakens the heart's ability to pump blood.  · Abnormal heart valves can cause HF when the heart valves do not open and close properly. This makes the heart muscle pump harder to keep the blood flowing.  · Heart muscle disease (cardiomyopathy or myocarditis) is damage to the heart muscle from a variety of causes. These can include drug or alcohol abuse, infections, or unknown reasons. These can increase the risk of HF.  · Lung disease makes the heart work harder because the lungs do not work properly. This can cause a strain on the heart leading it to fail.  · Diabetes increases the risk of HF. High blood sugar contributes to high  fat (lipid) levels in the blood. Diabetes can also cause slow damage to tiny blood vessels that carry important nutrients to the heart muscle. When the heart does not get enough oxygen and food, it can cause the heart to become weak and stiff. This leads to a heart that does not contract efficiently.  · Other diseases can contribute to HF. These include abnormal heart rhythms, thyroid problems, and low blood counts (anemia).  · Unhealthy lifestyle habits:  · Obesity.  · Smoking.  · Eating foods high in fat and cholesterol.  · Eating or drinking beverages high in salt.  · Drug or alcohol abuse.  · Lack of exercise.  SYMPTOMS   HF symptoms may vary and can be hard to detect. Symptoms may include:  · Shortness of breath with activity, such as climbing stairs.  · Persistent cough.  · Swelling of the feet, ankles, legs, or abdomen.  · Unexplained weight gain.  · Difficulty breathing when lying flat.  · Waking from sleep because of the need to sit up and get more air.  · Rapid heartbeat.  · Fatigue and loss of energy.  · Feeling lightheaded or close to fainting.  DIAGNOSIS   A diagnosis of HF is based on your history, symptoms, physical examination, and diagnostic tests.  Diagnostic tests for HF may include:  · EKG.  · Chest X-ray.  · Blood tests.  · Exercise stress test.  · Blood   oxygen test (arterial blood gas).  · Evaluation by a heart doctor (cardiologist).  · Ultrasound evaluation of the heart (echocardiogram).  · Heart artery test to look for blockages (angiogram).  · Radioactive imaging to look at the heart (radionuclide test).  TREATMENT   Treatment is aimed at managing the symptoms of HF. Medicines, lifestyle changes, or surgical intervention may be necessary to treat HF.  · Medicines to help treat HF may include:  · Angiotensin-converting enzyme (ACE) inhibitors. These block the effects of a blood protein called angiotensin-converting enzyme. ACE inhibitors relax (dilate) the blood vessels and help lower blood  pressure. This decreases the workload of the heart, slows the progression of HF, and improves symptoms.  · Angiotensin receptor blockers (ARBs). These medications work similar to ACE inhibitors. ARBs may be an alternative for people who cannot tolerate an ACE inhibitor.  · Aldosterone antagonists. This medication helps get rid of extra fluid from your body. This lowers the volume of blood the heart has to pump.  · Water pills (diuretics). Diuretics cause the kidneys to remove salt and water from the blood. The extra fluid is removed by urination. By removing extra fluid from the body, diuretics help lower the workload of the heart and help prevent fluid buildup in the lungs so breathing is easier.  · Beta blockers. These prevent the heart from beating too fast and improve heart muscle strength. Beta blockers help maintain a normal heart rate, control blood pressure, and improve HF symptoms.  · Digitalis. This increases the force of the heartbeat and may be helpful to people with HF or heart rhythm problems.  · Healthy lifestyle changes include:  · Stopping smoking.  · Eating a healthy diet. Avoid foods high in fat. Avoid foods fried in oil or made with fat. A dietician can help with healthy food choices.  · Limiting how much salt you eat.  · Limiting alcohol intake to no more than 1 drink per day for women and 2 drinks per day for men. Drinking more than that is harmful to your heart. If your heart has already been damaged by alcohol or you have severe HF, drinking alcohol should be stopped completely.  · Exercising as directed by your caregiver.  · Surgical treatment for HF may include:  · Procedures to open blocked arteries, repair damaged heart valves, or remove damaged heart muscle tissue.  · A pacemaker to help heart muscle function and to control certain abnormal heart rhythms.  · A defibrillator to possibly prevent sudden cardiac death.  HOME CARE INSTRUCTIONS   · Activity level. Your caregiver can help you  determine what type of exercise program may be helpful. It is important to maintain your strength. Pace your physical activity to avoid shortness of breath or chest pain. Rest for 1 hour before and after meals. A cardiac rehabilitation program may be helpful to some people with HF.  · Diet. Eat a heart healthy diet. Food choices should be low in saturated fat and cholesterol. Talk to a dietician to learn about heart healthy foods.  · Salt intake. When you have HF, you need to limit the amount of salt you eat. Eat less than 1500 milligrams (mg) of salt per day or as recommended by your caregiver.  · Weight monitoring. Weigh yourself every day. You should weigh yourself in the morning after you urinate and before you eat breakfast. Wear the same amount of clothing each time you weigh yourself. Record your weight daily. Bring your recorded   weights to your clinic visits. Tell your caregiver right away if you have gained 3 lb/1.4 kg in 1 day, or 5 lb/2.3 kg in a week or whatever amount you were told to report.  · Blood pressure monitoring. This should be done as directed by your caregiver. A home blood pressure cuff can be purchased at a drugstore. Record your blood pressure numbers and bring them to your clinic visits. Tell your caregiver if you become dizzy or lightheaded upon standing up.  · Smoking. If you are currently a smoker, it is time to quit. Nicotine makes your heart work harder by causing your blood vessels to constrict. Do not use nicotine gum or patches before talking to your caregiver.  · Follow up. Be sure to schedule a follow-up visit with your caregiver. Keep all your appointments.  SEEK MEDICAL CARE IF:   · Your weight increases by 3 lb/1.4 kg in 1 day or 5 lb/2.3 kg in a week.  · You notice increasing shortness of breath that is unusual for you. This may happen during rest, sleep, or with activity.  · You cough more than normal, especially with physical activity.  · You notice more swelling in your  hands, feet, ankles, or belly (abdomen).  · You are unable to sleep because it is hard to breathe.  · You cough up bloody mucus (sputum).  · You begin to feel "jumping" or "fluttering" sensations (palpitations) in your chest.  SEEK IMMEDIATE MEDICAL CARE IF:   · You have severe chest pain or pressure which may include symptoms such as:  · Pain or pressure in the arms, neck, jaw, or back.  · Feeling sweaty.  · Feeling sick to your stomach (nauseous).  · Feeling short of breath while at rest.  · Having a fast or irregular heartbeat.  · You experience stroke symptoms. These symptoms include:  · Facial weakness or numbness.  · Weakness or numbness in an arm, leg, or on one side of your body.  · Blurred vision.  · Difficulty talking or thinking.  · Dizziness or fainting.  · Severe headache.  MAKE SURE YOU:   · Understand these instructions.  · Will watch your condition.  · Will get help right away if you are not doing well or get worse.  Document Released: 09/03/2005 Document Revised: 03/04/2012 Document Reviewed: 12/16/2009  [employer]® Patient Information ©2013 [employer].

## 2012-10-22 NOTE — ED Notes (Signed)
Nilley RN called for report, will call back to get report in about .

## 2012-10-22 NOTE — ED Notes (Signed)
Pt deseat while ambulating to 78%.

## 2012-10-22 NOTE — Assessment & Plan Note (Addendum)
2/14 acute We had a long discussion re: CHF. I do not think he should be at home (he has a 77 year old dog he is worried about). His son will take the pt to [facility] ER now for further management.

## 2012-10-22 NOTE — Assessment & Plan Note (Signed)
TSH Continue with current prescription therapy as reflected on the Med list.  

## 2012-10-22 NOTE — Assessment & Plan Note (Signed)
They will go to ER -- [facility]

## 2012-10-22 NOTE — Progress Notes (Signed)
Subjective:     Cough This is a new problem. The current episode started in the past 7 days. The problem has been gradually worsening. The cough is non-productive. Associated symptoms include shortness of breath and wheezing. Pertinent negatives include no rash or sore throat. The symptoms are aggravated by lying down. He has tried OTC cough suppressant (alka-selter) for the symptoms. The treatment provided no relief. CAD  Shortness of Breath This is a new problem. The current episode started in the past 7 days. Associated symptoms include leg swelling, orthopnea, PND and wheezing. Pertinent negatives include no neck pain, rash or sore throat. The treatment provided no relief. His past medical history is significant for CAD. (CAD)  He cut it 1 wk ago against the railing  The patient presents for a follow-up of  chronic A fib, hypertension, chronic dyslipidemia, allergy to wasp stings   F/u blepharospasm BP is elevated at times   BP Readings from Last 3 Encounters:  10/22/12 140/90  07/15/12 120/80  06/25/12 146/80   Wt Readings from Last 3 Encounters:  10/22/12 166 lb (75.297 kg)  07/15/12 156 lb (70.761 kg)  06/25/12 156 lb (70.761 kg)      Review of Systems  Constitutional: Negative for appetite change, fatigue and unexpected weight change.  HENT: Negative for nosebleeds, congestion, sore throat, sneezing, trouble swallowing and neck pain.   Eyes: Negative for itching and visual disturbance.  Respiratory: Positive for cough, chest tightness, shortness of breath and wheezing.   Cardiovascular: Positive for orthopnea, leg swelling and PND. Negative for palpitations.  Gastrointestinal: Negative for nausea, diarrhea, blood in stool and abdominal distention.  Genitourinary: Negative for frequency and hematuria.  Musculoskeletal: Negative for back pain, joint swelling and gait problem.  Skin: Negative for rash.  Neurological: Negative for dizziness, tremors, speech difficulty and  weakness.  Psychiatric/Behavioral: Negative for sleep disturbance, dysphoric mood and agitation. The patient is not nervous/anxious.        Objective:   Physical Exam  Constitutional: He is oriented to person, place, and time. He appears well-developed. He appears distressed.       Mild resp distress  HENT:  Mouth/Throat: Oropharynx is clear and moist.  Eyes: Conjunctivae normal are normal. Pupils are equal, round, and reactive to light.  Neck: Normal range of motion. No JVD present. No thyromegaly present.  Cardiovascular: Intact distal pulses.  Exam reveals no gallop and no friction rub.   Murmur heard. Pulmonary/Chest: Effort normal. No respiratory distress. He has no wheezes. He has rales (B). He exhibits no tenderness.  Abdominal: Soft. Bowel sounds are normal. He exhibits no distension and no mass. There is no tenderness. There is no rebound and no guarding.  Musculoskeletal: Normal range of motion. He exhibits edema (2+ B). He exhibits no tenderness.  Lymphadenopathy:    He has no cervical adenopathy.  Neurological: He is alert and oriented to person, place, and time. He has normal reflexes. No cranial nerve deficit. He exhibits normal muscle tone. Coordination normal.  Skin: Skin is warm and dry. No rash noted.  Psychiatric: He has a normal mood and affect. His behavior is normal. Judgment and thought content normal.   Lab Results  Component Value Date   WBC 5.8 02/20/2012   HGB 13.4 02/20/2012   HCT 40.6 02/20/2012   PLT 201.0 02/20/2012   GLUCOSE 69* 02/20/2012   CHOL 164 11/08/2011   TRIG 47.0 11/08/2011   HDL 68.10 11/08/2011   LDLCALC 87 11/08/2011   ALT 20  11/08/2011   AST 26 11/08/2011   NA 138 02/20/2012   K 4.8 02/20/2012   CL 100 02/20/2012   CREATININE 0.7 02/20/2012   BUN 18 02/20/2012   CO2 31 02/20/2012   TSH 0.35 02/20/2012   PSA 0.81 02/20/2012   INR 2.2 10/07/2012         Assessment & Plan:

## 2012-10-22 NOTE — ED Notes (Signed)
Onset 5 days ago cold symptoms dry cough took over the counter without relief. Doctor sent patient to ED for evaluation for CHF. Ax4 answering and following commands appropriate.  Denies pain.

## 2012-10-22 NOTE — Progress Notes (Addendum)
ANTICOAGULATION CONSULT NOTE - Initial Consult  Pharmacy Consult for Coumadin Indication: atrial fibrillation  Allergies  Allergen Reactions  . Amiodarone Hcl     Weakness in muscles   . Amoxicillin     Rash  . Atorvastatin     unknown  . Benazepril Hcl     unknown  . Clonidine Hydrochloride     unknown  . Colesevelam     unknown  . Diltiazem Hcl     unknown  . Ezetimibe     Unknown     Vital Signs: Temp: 97.9 F (36.6 C) (02/05 1612) Temp src: Oral (02/05 1612) BP: 171/83 mmHg (02/05 1700) Pulse Rate: 65  (02/05 1700)  Labs:  Basename 10/22/12 1444  HGB 14.0  HCT 41.6  PLT 220  APTT --  LABPROT 24.1*  INR 2.28*  HEPARINUNFRC --  CREATININE 0.67  CKTOTAL --  CKMB --  TROPONINI <0.30    The CrCl is unknown because both a height and weight (above a minimum accepted value) are required for this calculation.   Medical History: Past Medical History  Diagnosis Date  . HYPOTHYROIDISM 09/04/2007  . ANXIETY 04/26/2008  . Essential hypertension, benign 10/03/2007  . LYMPHADENOPATHY 04/06/2009  . TOBACCO USE, QUIT 07/14/2009  . Atrial fibrillation 10/31/2010    Coumadin therapy;  Echo 6/12: EF 50-55%, moderate MR, moderate TR, PASP 52, mild LAE  . Pacemaker     Status post AV nodal ablation    Medications:    Assessment: 79 year old male admitted with shortness of breath on chronic coumadin for afib. INR therapeutic on admit with no bleeding complications noted. Patient has been taking warfarin 5mg  daily prior to admission and last INR at office on 1/21 was also therapeutic at 2.2. Will continue home regimen and monitor INR daily x3 days per policy then likely 3x weekly.  Goal of Therapy:  INR 2-3 Monitor platelets by anticoagulation protocol: Yes   Plan:  Warfarin 5mg  daily(dose already taken today) INR daily x3d   Severiano Gilbert 10/22/2012,6:34 PM

## 2012-10-22 NOTE — Telephone Encounter (Signed)
Patient Information:  Caller Name: Tyler  Phone: [phone]  Patient: Ricky [name], Ricky [name]  Gender: Male  DOB: Aug 31, 1923  Age: 79 Years  PCP: Plotnikov, Alex (Adults only)  Office Follow Up:  Does the office need to follow up with this patient?: No  Instructions For The Office: N/A   Symptoms  Reason For Call & Symptoms: pt reports he has a cold and cough.  Cough is constant.  Pt states he cannot lay flat; feels like he is going to suffocate.  Pt also reports legs are swollen from hips to toes  Reviewed Health History In EMR: Yes  Reviewed Medications In EMR: Yes  Reviewed Allergies In EMR: Yes  Reviewed Surgeries / Procedures: Yes  Date of Onset of Symptoms: 10/16/2012  Guideline(s) Used:  Cough  Disposition Per Guideline:   Go to Office Now  Reason For Disposition Reached:   Increasing ankle swelling  Advice Given:  Call Back If:  Difficulty breathing  You become worse.  Appointment Scheduled:  10/22/2012 11:00:00 Appointment Scheduled Provider:  Sonda Primes (Adults only)

## 2012-10-22 NOTE — H&P (Signed)
Patient's PCP: Sonda Primes, MD  Chief Complaint: Shortness of breath  History of Present Illness: Ricky [name] is a 79 y.o. Caucasian male with history of hypothyroidism, anxiety, hypertension, atrial fibrillation on chronic anticoagulation, and status post pacemaker after AV nodal ablation presents with the above complaints.  Patient reports that he felt like he was coming down with an upper respiratory viral infection on 10/16/2012 and reports having some sinus congestion with a dry cough on 10/17/2012.  He continued to have shortness of breath and had difficulty laying down in bed.  He also noted that he has had increasing lower extremity edema as a result he presented to his primary care physician who thought that the patient may be having congestive heart failure and instructed him to come to the emergency department for further evaluation.  Patient denies any fevers, chills, nausea, vomiting, chest pain, abdominal pain, diarrhea, headaches or vision changes.  He does report having cough which is nonproductive.  Patient desaturated on room air when he ambulated into the mid 70s.  Review of Systems: All systems reviewed with the patient and positive as per history of present illness, otherwise all other systems are negative.  Past Medical History  Diagnosis Date  . HYPOTHYROIDISM 09/04/2007  . ANXIETY 04/26/2008  . Essential hypertension, benign 10/03/2007  . LYMPHADENOPATHY 04/06/2009  . TOBACCO USE, QUIT 07/14/2009  . Atrial fibrillation 10/31/2010    Coumadin therapy;  Echo 6/12: EF 50-55%, moderate MR, moderate TR, PASP 52, mild LAE  . Pacemaker     Status post AV nodal ablation   Past Surgical History  Procedure Date  . Pacemaker insertion 10/16/05   Family History  Problem Relation Age of Onset  . Hypertension Other   . Hypertension Mother   . Heart disease Father    History   Social History  . Marital Status: Widowed    Spouse Name: N/A    Number of Children: N/A  .  Years of Education: N/A   Occupational History  . Not on file.   Social History Main Topics  . Smoking status: Former Games developer  . Smokeless tobacco: Not on file  . Alcohol Use: No  . Drug Use: No  . Sexually Active:    Other Topics Concern  . Not on file   Social History Narrative  . No narrative on file   Allergies: Amiodarone hcl; Amoxicillin; Atorvastatin; Benazepril hcl; Clonidine hydrochloride; Colesevelam; Diltiazem hcl; and Ezetimibe  Home Meds: Prior to Admission medications   Medication Sig Start Date End Date Taking? Authorizing Provider  carvedilol (COREG) 25 MG tablet Take 1 tablet (25 mg total) by mouth 2 (two) times daily. 11/13/11 11/12/12 Yes Georgina Quint Plotnikov, MD  Cholecalciferol 1000 UNITS tablet Take 1,000 Units by mouth daily.     Yes Historical Provider, MD  EPINEPHrine (EPIPEN 2-PAK) 0.3 mg/0.3 mL DEVI Inject 0.3 mg into the muscle once as needed. For allergic reaction   Yes Historical Provider, MD  furosemide (LASIX) 20 MG tablet Take 1 tablet (20 mg total) by mouth daily. 11/13/11  Yes Georgina Quint Plotnikov, MD  irbesartan (AVAPRO) 300 MG tablet Take 1 tablet (300 mg total) by mouth at bedtime. 06/25/12  Yes Georgina Quint Plotnikov, MD  levothyroxine (SYNTHROID, LEVOTHROID) 75 MCG tablet Take 75 mcg by mouth daily.   Yes Historical Provider, MD  Multiple Vitamin (MULTIVITAMIN) tablet Take 1 tablet by mouth daily.     Yes Historical Provider, MD  warfarin (COUMADIN) 5 MG tablet Take 1 tablet (  5 mg total) by mouth daily. Take as directed by coumadin clinic. 09/29/12  Yes Tresa Garter, MD  diphenhydrAMINE (BENADRYL) 25 mg capsule Take 1 capsule (25 mg total) by mouth every 4 (four) hours as needed for itching (1-2 for bee stings). 02/20/12 02/19/13  Georgina Quint Plotnikov, MD  nitroGLYCERIN (NITROSTAT) 0.4 MG SL tablet Place 0.4 mg under the tongue every 5 (five) minutes as needed. For chest pain    Historical Provider, MD    Physical Exam: Blood pressure 169/81, pulse  65, temperature 97.9 F (36.6 C), temperature source Oral, resp. rate 27, SpO2 97.00%. General: Awake, Oriented x3, No acute distress. HEENT: EOMI, Moist mucous membranes Neck: Supple CV: S1 and S2 Lungs: Clear to ascultation bilaterally Abdomen: Soft, Nontender, Nondistended, +bowel sounds. Ext: Good pulses.  2-3+ lower extremity edema. No clubbing or cyanosis noted. Neuro: Cranial Nerves II-XII grossly intact. Has 5/5 motor strength in upper and lower extremities.  Lab results:  [facility] 10/22/12 1444  NA 135  K 4.2  CL 96  CO2 30  GLUCOSE 103*  BUN 15  CREATININE 0.67  CALCIUM 9.2  MG --  PHOS --    Basename 10/22/12 1444  AST 31  ALT 21  ALKPHOS 90  BILITOT 0.8  PROT 7.2  ALBUMIN 3.6   No results found for this basename: LIPASE:2,AMYLASE:2 in the last 72 hours  Basename 10/22/12 1444  WBC 7.4  NEUTROABS 4.6  HGB 14.0  HCT 41.6  MCV 99.8  PLT 220    Basename 10/22/12 1444  CKTOTAL --  CKMB --  CKMBINDEX --  TROPONINI <0.30   No components found with this basename: POCBNP:3 No results found for this basename: DDIMER in the last 72 hours No results found for this basename: HGBA1C:2 in the last 72 hours No results found for this basename: CHOL:2,HDL:2,LDLCALC:2,TRIG:2,CHOLHDL:2,LDLDIRECT:2 in the last 72 hours No results found for this basename: TSH,T4TOTAL,FREET3,T3FREE,THYROIDAB in the last 72 hours No results found for this basename: VITAMINB12:2,FOLATE:2,FERRITIN:2,TIBC:2,IRON:2,RETICCTPCT:2 in the last 72 hours Imaging results:  Dg Chest 2 View  10/22/2012  *RADIOLOGY REPORT*  Clinical Data: Shortness of breath.  Cough.  Upper respiratory tract symptoms.  CHEST - 2 VIEW  Comparison: Two-view chest 02/03/2011.  Findings: The heart size is normal.  Emphysematous changes are again noted.  No focal airspace consolidation is evident.  Scarring and cephalization of the hila bilaterally is stable.  Pacing wires are stable.  The visualized soft tissues and bony  thorax are unremarkable.  A fracture of the right humeral head is stable.  IMPRESSION:  1.  Emphysema. 2.  No acute cardiopulmonary disease or significant interval change.   Original Report Authenticated By: Marin Roberts, M.D.    Other results: EKG: Ventricular lead paced with heart rate of 72.  Assessment & Plan by Problem: Acute hypoxic respiratory failure due to acute on chronic diastolic congestive heart failure Patient's INR is therapeutic, do not suspect a thromboembolic event.  Influenza panel has been sent, suspect will likely be negative.  Chest x-ray shows emphysematous changes otherwise no cardiopulmonary disease, given patient does not have purulent sputum do not suspect bronchitis at this time.  Will diuresis the patient on IV Lasix and observe response.  Monitor I.'s and O.'s and weight daily.  If no improvement with diuresis or if having a productive purulent sputum consider antibiotics at that time.  Will need a walking desaturation prior to discharge to see if patient qualifies for oxygen.  Cycle cardiac enzymes.  2-D echocardiogram on  10/14/2010, EF of 55-50%.  Hypothyroidism Continue levothyroxine.  Atrial fibrillation status post pacemaker Rate controlled.  Continue anticoagulation on Coumadin.  Hypertension Continue home antihypertensive medications.  Generalized weakness Will request PT consultation.  Prophylaxis Therapeutic INR.  CODE STATUS Full code.  This was discussed with the patient at the time of admission.  He indicated that he does not wish to be a possible long-term but wants everything done temporarily.  Disposition Admit the patient as inpatient to telemetry.  Time spent on admission, talking to the patient, and coordinating care was: 60 mins.  Omar Orrego A, MD 10/22/2012, 4:59 PM

## 2012-10-23 ENCOUNTER — Encounter (HOSPITAL_COMMUNITY): Payer: Self-pay | Admitting: *Deleted

## 2012-10-23 DIAGNOSIS — J9600 Acute respiratory failure, unspecified whether with hypoxia or hypercapnia: Principal | ICD-10-CM

## 2012-10-23 DIAGNOSIS — I4891 Unspecified atrial fibrillation: Secondary | ICD-10-CM

## 2012-10-23 DIAGNOSIS — I5033 Acute on chronic diastolic (congestive) heart failure: Secondary | ICD-10-CM

## 2012-10-23 LAB — BASIC METABOLIC PANEL
BUN: 15 mg/dL (ref 6–23)
Creatinine, Ser: 0.74 mg/dL (ref 0.50–1.35)
GFR calc non Af Amer: 81 mL/min — ABNORMAL LOW (ref >90)
Glucose, Bld: 96 mg/dL (ref 70–99)
Potassium: 3.8 mEq/L (ref 3.5–5.1)

## 2012-10-23 LAB — INFLUENZA PANEL BY PCR (TYPE A & B)
H1N1 flu by pcr: NOT DETECTED
Influenza A By PCR: NEGATIVE

## 2012-10-23 LAB — TROPONIN I: Troponin I: <0.3 ng/mL (ref <0.30)

## 2012-10-23 MED ORDER — WARFARIN - PHARMACIST DOSING INPATIENT: Freq: Every day | Status: DC

## 2012-10-23 MED ORDER — HYDRALAZINE HCL 20 MG/ML IJ SOLN
5.0000 mg | Freq: Three times a day (TID) | INTRAMUSCULAR | Status: DC | PRN
  Administered 2012-10-23: 5 mg via INTRAVENOUS
  Filled 2012-10-23 (×2): qty 0.25

## 2012-10-23 MED ORDER — WARFARIN SODIUM 5 MG PO TABS
5.0000 mg | ORAL_TABLET | Freq: Every day | ORAL | Status: DC
  Administered 2012-10-23: 5 mg via ORAL
  Filled 2012-10-23 (×2): qty 1

## 2012-10-23 NOTE — Progress Notes (Signed)
Occupational Therapy Evaluation Patient Details Name: Ricky [name] MRN: [id number] DOB: 12/23/1922 Today's Date: 10/23/2012 Time: 1105-1140 OT Time Calculation (min): 35 min  OT Assessment / Plan / Recommendation Clinical Impression  79 yo with admission due to increased SOB and increased LE edema. Pt ambulating with O2 97% RA. Able to demonstrate ability to safety D/C home with intermittent S of family . Son states they will provide needed level of assistance. Also discussed home safety and recommendations. No further OT indicated at this time.    OT Assessment  Patient does not need any further OT services    Follow Up Recommendations  No OT follow up    Barriers to Discharge  none    Equipment Recommendations  Tub/shower seat    Recommendations for Other Services  none  Frequency    eval only   Precautions / Restrictions Precautions Precautions: None   Pertinent Vitals/Pain No c/o pain. BP 183/88 sitting. O2 SATS 97 RA    ADL  Eating/Feeding: Independent Where Assessed - Eating/Feeding: Chair Grooming: Independent Where Assessed - Grooming: Unsupported standing Upper Body Bathing: Set up Where Assessed - Upper Body Bathing: Unsupported sitting Lower Body Bathing: Set up Where Assessed - Lower Body Bathing: Unsupported sit to stand Upper Body Dressing: Modified independent Where Assessed - Upper Body Dressing: Unsupported sitting Lower Body Dressing: Set up Where Assessed - Lower Body Dressing: Unsupported sit to stand Toilet Transfer: Supervision/safety Toilet Transfer Method: Sit to Barista: Comfort height toilet Toileting - Clothing Manipulation and Hygiene: Independent Where Assessed - Engineer, mining and Hygiene: Sit to stand from 3-in-1 or toilet Tub/Shower Transfer: Supervision/safety Tub/Shower Transfer Method: Ambulating Equipment Used: Gait belt Transfers/Ambulation Related to ADLs: mod I. S with ambulation  without AD in hall ADL Comments: discussed home safety with pt/son. Rec for pt to have sons sstay with him initially after D/C home for 2-3 nights and for pt to have S during shower. Rec pt use showerchair and grab bars  to reduce risk of falls. Family appeared receptive.    OT Diagnosis:    OT Problem List:   OT Treatment Interventions:     OT Goals Acute Rehab OT Goals OT Goal Formulation:  (eval only)  Visit Information  Last OT Received On: 10/23/12 Assistance Needed: +1    Subjective Data      Prior Functioning     Home Living Lives With: Alone Available Help at Discharge: Family;Available PRN/intermittently Type of Home: House Home Access: Stairs to enter [employer] of Steps: 4 Entrance Stairs-Rails: Right;Left;Can reach both Home Layout: One level Bathroom Shower/Tub: Forensic scientist: Standard Bathroom Accessibility: Yes How Accessible: Accessible via walker Home Adaptive Equipment: None Additional Comments: son comes 3x/wk; 2nd son works near by and available Prior Function Level of Independence: Independent Able to Take Stairs?: Yes Driving: Yes 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 Dominant Hand: Right         Vision/Perception Vision - History Baseline Vision: No visual deficits   Copywriter, advertising Overall Cognitive Status: Appears within functional limits for tasks assessed/performed Arousal/Alertness: Awake/alert Orientation Level: Oriented X4 / Intact Behavior During Session: WFL for tasks performed    Extremity/Trunk Assessment Right Upper Extremity Assessment RUE ROM/Strength/Tone: WFL for tasks assessed RUE Sensation: WFL - Light Touch;WFL - Proprioception RUE Coordination: WFL - gross/fine motor Left Upper Extremity Assessment LUE ROM/Strength/Tone: WFL for tasks assessed LUE Sensation: WFL - Light Touch;WFL - Proprioception LUE  Coordination:  WFL - gross/fine motor Right Lower Extremity Assessment RLE ROM/Strength/Tone: Deficits;Due to pain (R knee pain otherwide WFL) RLE Sensation: WFL - Light Touch;WFL - Proprioception RLE Coordination: WFL - gross/fine motor Left Lower Extremity Assessment LLE ROM/Strength/Tone: WFL for tasks assessed LLE Sensation: WFL - Light Touch;WFL - Proprioception LLE Coordination: WFL - gross/fine motor Trunk Assessment Trunk Assessment: Kyphotic     Mobility Bed Mobility Bed Mobility: Not assessed Supine to Sit: 6: Modified independent (Device/Increase time);HOB elevated Sitting - Scoot to Edge of Bed: 7: Independent Details for Bed Mobility Assistance: pt in a hurry to use urinal due to lasix and did not have time to put [facility] to 0 Transfers Transfers: Sit to Stand;Stand to Sit Sit to Stand: 6: Modified independent (Device/Increase time);With upper extremity assist;From chair/3-in-1 Stand to Sit: 6: Modified independent (Device/Increase time);With upper extremity assist;To chair/3-in-1 Details for Transfer Assistance: x3     Exercise     Balance Balance Balance Assessed: Yes Dynamic Sitting Balance Dynamic Sitting - Balance Support: No upper extremity supported;Feet supported Dynamic Sitting - Level of Assistance: 7: Independent Static Standing Balance Static Standing - Balance Support: No upper extremity supported Static Standing - Level of Assistance: 7: Independent Rhomberg - Eyes Opened: 30  Dynamic Standing Balance Dynamic Standing - Balance Support: No upper extremity supported Dynamic Standing - Level of Assistance: 7: Independent Dynamic Standing - Balance Activities: Reaching for objects High Level Balance High Level Balance Comments: Pt with 1 LOB when picking item up from floor but independently regained.    End of Session OT - End of Session Equipment Utilized During Treatment: Gait belt Activity Tolerance: Patient tolerated treatment well Patient left: in  chair;with call bell/phone within reach;with family/visitor present Nurse Communication: Mobility status;Other (comment) (BP)  GO     Elin Seats,HILLARY 10/23/2012, 11:53 AM Luisa Dago, OTR/L  [phone] 10/23/2012

## 2012-10-23 NOTE — Evaluation (Signed)
Physical Therapy Evaluation Patient Details Name: Ricky [name] MRN: [id number] DOB: 1922-09-24 Today's Date: 10/23/2012 Time: 1191-4782 PT Time Calculation (min): 23 min  PT Assessment / Plan / Recommendation Clinical Impression  79 yo adm with respiratory issues and acute on chronic CHF. Pt very active and independent PTA. No skilled PT needs identified.     PT Assessment  Patent does not need any further PT services    Follow Up Recommendations  No PT follow up                Equipment Recommendations  None recommended by PT               Precautions / Restrictions Precautions Precautions: None   Pertinent Vitals/Pain VSS; see doc flowsheets Reported slight Rt knee pain on first walking; improved with activity      Mobility  Bed Mobility Bed Mobility: Supine to Sit;Sitting - Scoot to Edge of Bed Supine to Sit: 6: Modified independent (Device/Increase time);HOB elevated Sitting - Scoot to Edge of Bed: 7: Independent Details for Bed Mobility Assistance: pt in a hurry to use urinal due to lasix and did not have time to put [facility] to 0 Transfers Transfers: Sit to Stand;Stand to Sit Sit to Stand: 7: Independent;Without upper extremity assist;From bed;From chair/3-in-1 Stand to Sit: 7: Independent;With upper extremity assist;With armrests;Without upper extremity assist;To bed;To chair/3-in-1 Details for Transfer Assistance: x3 Ambulation/Gait Ambulation/Gait Assistance: 7: Independent Ambulation Distance (Feet): 50 Feet Assistive device: None Ambulation/Gait Assistance Details: limited to in room due to droplet precautions; pt able to maneuver in small spaces, around obstacles, no imbalance or dizzines Gait Pattern: Within Functional Limits       Visit Information  Last PT Received On: 10/23/12 Assistance Needed: +1    Subjective Data  Subjective: Pt reports he is very active; takes care of his home and yard Patient Stated Goal: return home I'ly   Prior  Functioning  Home Living Lives With: Alone Available Help at Discharge: Family;Available PRN/intermittently Type of Home: House Home Access: Stairs to enter [employer] of Steps: 4 Entrance Stairs-Rails: Right;Left;Can reach both Home Layout: One level Bathroom Shower/Tub: Forensic scientist: Standard Bathroom Accessibility: Yes How Accessible: Accessible via walker Home Adaptive Equipment: None Additional Comments: son comes 3x/wk; 2nd son works near by and available Prior Function Level of Independence: Independent Able to Take Stairs?: Yes Driving: Yes Vocation: Retired Comments: worked in Acupuncturist Overall Cognitive Status: Appears within functional limits for tasks assessed/performed Arousal/Alertness: Awake/alert Orientation Level: Oriented X4 / Intact Behavior During Session: [facility] for tasks performed    Extremity/Trunk Assessment Right Lower Extremity Assessment RLE ROM/Strength/Tone: WFL for tasks assessed RLE Sensation: WFL - Light Touch RLE Coordination: WFL - gross motor Left Lower Extremity Assessment LLE ROM/Strength/Tone: WFL for tasks assessed LLE Sensation: WFL - Light Touch LLE Coordination: WFL - gross motor Trunk Assessment Trunk Assessment: Kyphotic   Balance Balance Balance Assessed: Yes Dynamic Sitting Balance Dynamic Sitting - Balance Support: No upper extremity supported;Feet supported Dynamic Sitting - Level of Assistance: 7: Independent Static Standing Balance Static Standing - Balance Support: No upper extremity supported Static Standing - Level of Assistance: 7: Independent Rhomberg - Eyes Opened: 30  Dynamic Standing Balance Dynamic Standing - Balance Support: No upper extremity supported Dynamic Standing - Level of Assistance: 7: Independent Dynamic Standing - Balance Activities: Reaching for objects  End of Session PT - End of Session Activity Tolerance: Patient tolerated  treatment well  Patient left: in chair;with call bell/phone within reach Nurse Communication: Mobility status;Other (comment) (96-98% on RA and left on RA)  GP     Mirtie Bastyr 10/23/2012, 9:22 AM  Pager [phone]

## 2012-10-23 NOTE — Progress Notes (Signed)
[facility] PROGRESS NOTE  Ricky [name] ZOX:[id number] DOB: November 19, 1922 DOA: 10/22/2012 PCP: Ricky Primes, MD  Assessment/Plan: Acute hypoxic respiratory failure due to acute on chronic diastolic congestive heart failure  Patient's INR is therapeutic, do not suspect a thromboembolic event. Influenza panel has been  negative. Chest x-ray shows emphysematous changes otherwise no cardiopulmonary disease, given patient does not have purulent sputum do not suspect bronchitis at this time. Will diuresis the patient on IV Lasix and observe response. Monitor I.'s and O.'s and weight daily. cardiac enzymes. Negative. 2-D echocardiogram on 10/14/2010, EF of 55-50%.  I/O last 3 completed shifts: In: 124 [P.O.:120; IV Piggyback:4] Out: 2510 [Urine:2510] Total I/O In: 484 [P.O.:480; IV Piggyback:4] Out: 1500 [Urine:1500]   Continue with IV lasix for 24 more hours and change to po in am.  Hypothyroidism  Continue levothyroxine.  Atrial fibrillation status post pacemaker  Rate controlled. Continue anticoagulation on Coumadin.  Hypertension  Continue home antihypertensive medications.  Generalized weakness   PT consultation recommended no PT Follow up and his oxygen sats have remained well over 91% on ambulation.  Prophylaxis  Therapeutic INR   Code Status: full code Family Communication: daughter at bedside Disposition Plan: possibly in am.    Consultants:  none  Antibiotics:    HPI/Subjective: No new complaints , wants to go home.   Objective: Filed Vitals:   10/23/12 1100 10/23/12 1151 10/23/12 1332 10/23/12 1546  BP: 183/88 180/84 124/49 103/55  Pulse: 65 65 65 65  Temp:   97.6 F (36.4 C)   TempSrc:   Oral   Resp:   20 19  Height:      Weight:      SpO2: 97%  100% 97%    Intake/Output Summary (Last 24 hours) at 10/23/12 1628 Last data filed at 10/23/12 1338  Gross per 24 hour  Intake    608 ml  Output   4010 ml  Net  -3402 ml   Filed Weights   10/22/12  1910 10/23/12 0638  Weight: 71.5 kg (157 lb 10.1 oz) 69.7 kg (153 lb 10.6 oz)    Exam: Alert afebrile comfortable sitting up int he chair on RA.  CV: S1 and S2  Lungs: Clear to ascultation bilaterally  Abdomen: Soft, Nontender, Nondistended, +bowel sounds.  Ext: Good pulses. 2-3+ lower extremity edema. No clubbing or cyanosis noted.   Data Reviewed: Basic Metabolic Panel:  Lab 10/23/12 8119 10/22/12 1444  NA 136 135  K 3.8 4.2  CL 97 96  CO2 32 30  GLUCOSE 96 103*  BUN 15 15  CREATININE 0.74 0.67  CALCIUM 9.1 9.2  MG 2.0 --  PHOS -- --   Liver Function Tests:  Lab 10/22/12 1444  AST 31  ALT 21  ALKPHOS 90  BILITOT 0.8  PROT 7.2  ALBUMIN 3.6   No results found for this basename: LIPASE:5,AMYLASE:5 in the last 168 hours No results found for this basename: AMMONIA:5 in the last 168 hours CBC:  Lab 10/22/12 1444  WBC 7.4  NEUTROABS 4.6  HGB 14.0  HCT 41.6  MCV 99.8  PLT 220   Cardiac Enzymes:  Lab 10/23/12 0011 10/22/12 1822 10/22/12 1444  CKTOTAL -- -- --  CKMB -- -- --  CKMBINDEX -- -- --  TROPONINI <0.30 <0.30 <0.30   BNP (last 3 results)  Basename 10/22/12 1444  PROBNP 1735.0*   CBG: No results found for this basename: GLUCAP:5 in the last 168 hours  No results found for this or  any previous visit (from the past 240 hour(s)).   Studies: Dg Chest 2 View  10/22/2012  *RADIOLOGY REPORT*  Clinical Data: Shortness of breath.  Cough.  Upper respiratory tract symptoms.  CHEST - 2 VIEW  Comparison: Two-view chest 02/03/2011.  Findings: The heart size is normal.  Emphysematous changes are again noted.  No focal airspace consolidation is evident.  Scarring and cephalization of the hila bilaterally is stable.  Pacing wires are stable.  The visualized soft tissues and bony thorax are unremarkable.  A fracture of the right humeral head is stable.  IMPRESSION:  1.  Emphysema. 2.  No acute cardiopulmonary disease or significant interval change.   Original Report  Authenticated By: Marin Roberts, M.D.     Scheduled Meds:   . carvedilol  25 mg Oral BID  . cholecalciferol  1,000 Units Oral Daily  . furosemide  40 mg Intravenous BID  . irbesartan  300 mg Oral QHS  . levothyroxine  75 mcg Oral Daily  . multivitamin with minerals  1 tablet Oral Daily  . sodium chloride  3 mL Intravenous Q12H  . warfarin  5 mg Oral q1800  . Warfarin - Pharmacist Dosing Inpatient   Does not apply q1800   Continuous Infusions:   Principal Problem:  *Acute respiratory failure Active Problems:  HYPOTHYROIDISM  ANXIETY  Atrial fibrillation  Long term (current) use of anticoagulants  Pacemaker-St. Jude  Hypertensive heart disease  Acute on chronic diastolic heart failure       Ricky [name]  [facility] Pager [phone]. If 8PM-8AM, please contact night-coverage at [email or web address], password [facility] 10/23/2012, 4:28 PM  LOS: 1 day

## 2012-10-23 NOTE — Care Management Note (Unsigned)
    Page 1 of 1   10/23/2012     11:13:53 AM   CARE MANAGEMENT NOTE 10/23/2012  Patient:  Ricky [name], Ricky [name]   Account Number:  [id number]  Date Initiated:  10/23/2012  Documentation initiated by:  [facility]  Subjective/Objective Assessment:   79yo male admitted with SOB     Action/Plan:   Chest x-ray shows emphysematous changes otherwise no cardiopulmonary disease, given patient does not have purulent sputum do not suspect bronchitis at this time. Will diuresis the patient on IV Lasix and observe response   Anticipated DC Date:  10/23/2012   Anticipated DC Plan:  HOME W HOME HEALTH SERVICES      DC Planning Services  CM consult      Choice offered to / List presented to:  C-1 Patient        HH arranged  HH-1 RN      [facility] agency  [facility]   Status of service:  Completed, signed off Medicare Important Message given?   (If response is "NO", the following Medicare IM given date fields will be blank) Date Medicare IM given:   Date Additional Medicare IM given:    Discharge Disposition:    Per UR Regulation:    If discussed at Long Length of Stay Meetings, dates discussed:    Comments:  10/23/12 @ 1000 Spoke with pt regarding receiving HHRN visits.  Pt chose to have services through [facility].  Called Lupita Leash to set up. Oletta Cohn, RN, BSN, [place] [phone]

## 2012-10-23 NOTE — Progress Notes (Signed)
Pt Bp trend since 1900 2-5 sys >170. Page Md. Order PRN Hydralazine., BP check at 0903 162/71 holding hydralazine at this time. Will continue to monitor

## 2012-10-23 NOTE — Progress Notes (Signed)
Utilization Review Completed Daisey Caloca J. Sarrah Fiorenza, RN, BSN, NCM [phone]  

## 2012-10-23 NOTE — Progress Notes (Signed)
ANTICOAGULATION CONSULT NOTE - Follow Up Consult  Pharmacy Consult for Coumadin Indication: atrial fibrillation  Allergies  Allergen Reactions  . Amiodarone Hcl     Weakness in muscles   . Amoxicillin     Rash  . Atorvastatin     unknown  . Benazepril Hcl     unknown  . Clonidine Hydrochloride     unknown  . Colesevelam     unknown  . Diltiazem Hcl     unknown  . Ezetimibe     Unknown     Patient Measurements: Height: 6' (182.9 cm) Weight: 153 lb 10.6 oz (69.7 kg) IBW/kg (Calculated) : 77.6  Heparin Dosing Weight:   Vital Signs: Temp: 97.7 F (36.5 C) (02/06 0638) Temp src: Oral (02/06 0638) BP: 162/71 mmHg (02/06 0903) Pulse Rate: 65  (02/06 0903)  Labs:  Basename 10/23/12 0620 10/23/12 0011 10/22/12 1822 10/22/12 1444  HGB -- -- -- 14.0  HCT -- -- -- 41.6  PLT -- -- -- 220  APTT -- -- -- --  LABPROT 25.5* -- -- 24.1*  INR 2.46* -- -- 2.28*  HEPARINUNFRC -- -- -- --  CREATININE 0.74 -- -- 0.67  CKTOTAL -- -- -- --  CKMB -- -- -- --  TROPONINI -- <0.30 <0.30 <0.30    Estimated Creatinine Clearance: 64.1 ml/min (by C-G formula based on Cr of 0.74).  Assessment: 89yom on chronic Coumadin for hx Afib. INR (2.46) remains therapeutic on home regimen (5mg  daily) - will continue. - H/H and Plts wnl - No significant bleeding reported  Goal of Therapy:  INR 2-3   Plan:  1. Continue Coumadin 5mg  daily 2. Follow-up AM INR  Cleon Dew [phone] 10/23/2012,9:28 AM

## 2012-10-23 NOTE — Plan of Care (Signed)
Problem: Phase I Progression Outcomes Goal: EF % per last Echo/documented,Core Reminder form on chart Outcome: Completed/Met Date Met:  10/23/12 50-55%  02/2011

## 2012-10-24 LAB — BASIC METABOLIC PANEL
BUN: 19 mg/dL (ref 6–23)
Chloride: 98 mEq/L (ref 96–112)
Creatinine, Ser: 0.81 mg/dL (ref 0.50–1.35)
GFR calc non Af Amer: 78 mL/min — ABNORMAL LOW (ref >90)
Glucose, Bld: 96 mg/dL (ref 70–99)
Potassium: 4.1 mEq/L (ref 3.5–5.1)

## 2012-10-24 MED ORDER — FUROSEMIDE 40 MG PO TABS: 40.0000 mg | ORAL_TABLET | Freq: Every day | ORAL | Status: DC

## 2012-10-24 NOTE — Progress Notes (Signed)
Late entry for [facility] (Nov 09, 2012)  11-09-2012 1100  OT G-codes **NOT FOR INPATIENT CLASS**  Functional Assessment Tool Used Chart review and clinical judgement  Functional Limitation Self care  Self Care Current Status [phone]) CI  Self Care Goal Status (X9147) CI  Self Care Discharge Status (W2956) CI  OT General Charges  $OT Visit 1 Procedure  OT Evaluation  $Initial OT Evaluation Tier I 1 Procedure  OT Treatments  $Self Care/Home Management  23-37 mins

## 2012-10-24 NOTE — Discharge Summary (Signed)
Physician Discharge Summary  Ricky [name] ZOX:[id number] DOB: [birth year] DOA: 10/22/2012  PCP: Ricky Primes, [name]  Admit date: 10/22/2012 Discharge date: 10/24/2012  Time spent: 30 minutes  Recommendations for Outpatient Follow-up:  1. Follow up with cardiology next week.  Discharge Diagnoses:  Principal Problem:  *Acute respiratory failure Active Problems:  HYPOTHYROIDISM  ANXIETY  Atrial fibrillation  Long term (current) use of anticoagulants  Pacemaker-[employer]  Hypertensive heart disease  Acute on chronic diastolic heart failure   Discharge Condition: stable  Diet recommendation: low sodium diet  Filed Weights   10/22/12 1910 10/23/12 0638 10/24/12 0530  Weight: 71.5 kg (157 lb 10.1 oz) 69.7 kg (153 lb 10.6 oz) 69.264 kg (152 lb 11.2 oz)    History of present illness:  [name] is a 79 y.o. Caucasian male with history of hypothyroidism, anxiety, hypertension, atrial fibrillation on chronic anticoagulation, and status post pacemaker after AV nodal ablation presents with the above complaints. Patient reports that he felt like he was coming down with an upper respiratory viral infection on 10/16/2012 and reports having some sinus congestion with a dry cough on 10/17/2012. He continued to have shortness of breath and had difficulty laying down in bed. He also noted that he has had increasing lower extremity edema as a result he presented to his primary care physician who thought that the patient may be having congestive heart failure and instructed him to come to the emergency department for further evaluation. Patient denies any fevers, chills, nausea, vomiting, chest pain, abdominal pain, diarrhea, headaches or vision changes. He does report having cough which is nonproductive. Patient desaturated on room air when he ambulated into the mid 70s.   Hospital Course:  Acute hypoxic respiratory failure due to acute on chronic diastolic congestive heart failure  . Influenza  panel has been negative. Chest x-ray shows emphysematous changes otherwise no cardiopulmonary disease, given patient does not have purulent sputum do not suspect bronchitis at this time. Diuresed the patient on IV Lasix andhe is being discharged on 40 mg of po lasix..  cardiac enzymes. Negative. 2-D echocardiogram on 10/14/2010, EF of 55-50%.  I/O last 3 completed shifts:  In: 124 [P.O.:120; IV Piggyback:4]  Out: 2510 [Urine:2510]  Total I/O  In: 484 [P.O.:480; IV Piggyback:4]  Out: 1500 [Urine:1500]  Increased his lasix to 40 mg daily  Hypothyroidism  Continue levothyroxine.  Atrial fibrillation status post pacemaker  Rate controlled. Continue anticoagulation on Coumadin.  Hypertension  Continue home antihypertensive medications.  Generalized weakness  PT consultation recommended no PT Follow up and his oxygen sats have remained well over 91% on ambulation.  Prophylaxis  Therapeutic INR   Discharge Exam: Filed Vitals:   10/23/12 1546 10/23/12 2130 10/24/12 0530 10/24/12 0955  BP: 103/55 124/64 156/78 126/54  Pulse: 65 65  65  Temp:  97.8 F (36.6 C) 97.1 F (36.2 C)   TempSrc:  Oral Oral   Resp: 19 16 18 18   Height:      Weight:   69.264 kg (152 lb 11.2 oz)   SpO2: 97% 98% 97% 100%   Alert afebrile comfortable sitting up int he chair on RA.  CV: S1 and S2  Lungs: Clear to ascultation bilaterally  Abdomen: Soft, Nontender, Nondistended, +bowel sounds.  Ext: Good pulses. 2-3+ lower extremity edema. No clubbing or cyanosis noted.       Discharge Instructions  Discharge Orders    Future Appointments: Provider: Department: Dept Phone: Center:   10/27/2012 1:15 PM Ricky Quint  Plotnikov, [name] [facility] [phone] [facility]   10/28/2012 10:45 AM Ricky Salvia, [name] [facility] Main Office [place]) [phone] [facility]   11/21/2012 10:00 AM [facility] [phone] [facility]     Future Orders  Please Complete By Expires   Diet - low sodium heart healthy      Discharge instructions      Comments:   Follow up with your PCP in 1 to 2 weeks Please obtain a bMP to check renal function in one week as we are increasing your lasix dose.   Activity as tolerated - No restrictions      (HEART FAILURE PATIENTS) Call [name]:  Anytime you have any of the following symptoms: 1) 3 pound weight gain in 24 hours or 5 pounds in 1 week 2) shortness of breath, with or without a dry hacking cough 3) swelling in the hands, feet or stomach 4) if you have to sleep on extra pillows at night in order to breathe.          Medication List     As of 10/24/2012 10:05 AM    TAKE these medications         carvedilol 25 MG tablet   Commonly known as: COREG   Take 1 tablet (25 mg total) by mouth 2 (two) times daily.      Cholecalciferol 1000 UNITS tablet   Take 1,000 Units by mouth daily.      diphenhydrAMINE 25 mg capsule   Commonly known as: BENADRYL   Take 1 capsule (25 mg total) by mouth every 4 (four) hours as needed for itching (1-2 for bee stings).      EPIPEN 2-PAK 0.3 mg/0.3 mL Devi   Generic drug: EPINEPHrine   Inject 0.3 mg into the muscle once as needed. For allergic reaction      furosemide 40 MG tablet   Commonly known as: LASIX   Take 1 tablet (40 mg total) by mouth daily.      irbesartan 300 MG tablet   Commonly known as: AVAPRO   Take 1 tablet (300 mg total) by mouth at bedtime.      levothyroxine 75 MCG tablet   Commonly known as: SYNTHROID, LEVOTHROID   Take 75 mcg by mouth daily.      multivitamin tablet   Take 1 tablet by mouth daily.      nitroGLYCERIN 0.4 MG SL tablet   Commonly known as: NITROSTAT   Place 0.4 mg under the tongue every 5 (five) minutes as needed. For chest pain      warfarin 5 MG tablet   Commonly known as: COUMADIN   Take 1 tablet (5 mg total) by mouth daily. Take as directed by coumadin clinic.          The results of significant diagnostics  from this hospitalization (including imaging, microbiology, ancillary and laboratory) are listed below for reference.    Significant Diagnostic Studies: Dg Chest 2 View  10/22/2012  *RADIOLOGY REPORT*  Clinical Data: Shortness of breath.  Cough.  Upper respiratory tract symptoms.  CHEST - 2 VIEW  Comparison: Two-view chest 02/03/2011.  Findings: The heart size is normal.  Emphysematous changes are again noted.  No focal airspace consolidation is evident.  Scarring and cephalization of the hila bilaterally is stable.  Pacing wires are stable.  The visualized soft tissues and bony thorax are unremarkable.  A fracture of the right humeral head is stable.  IMPRESSION:  1.  Emphysema. 2.  No acute cardiopulmonary disease or significant interval change.   Original Report Authenticated By: Marin Roberts, M.D.     Microbiology: No results found for this or any previous visit (from the past 240 hour(s)).   Labs: Basic Metabolic Panel:  Lab 10/24/12 9604 10/23/12 0620 10/22/12 1444  NA 138 136 135  K 4.1 3.8 4.2  CL 98 97 96  CO2 33* 32 30  GLUCOSE 96 96 103*  BUN 19 15 15   CREATININE 0.81 0.74 0.67  CALCIUM 9.1 9.1 9.2  MG -- 2.0 --  PHOS -- -- --   Liver Function Tests:  Lab 10/22/12 1444  AST 31  ALT 21  ALKPHOS 90  BILITOT 0.8  PROT 7.2  ALBUMIN 3.6   No results found for this basename: LIPASE:5,AMYLASE:5 in the last 168 hours No results found for this basename: AMMONIA:5 in the last 168 hours CBC:  Lab 10/22/12 1444  WBC 7.4  NEUTROABS 4.6  HGB 14.0  HCT 41.6  MCV 99.8  PLT 220   Cardiac Enzymes:  Lab 10/23/12 0011 10/22/12 1822 10/22/12 1444  CKTOTAL -- -- --  CKMB -- -- --  CKMBINDEX -- -- --  TROPONINI <0.30 <0.30 <0.30   BNP: BNP (last 3 results)  Basename 10/22/12 1444  PROBNP 1735.0*   CBG: No results found for this basename: GLUCAP:5 in the last 168 hours     Signed:  Antinio Sanderfer  [facility] 10/24/2012, 10:05 AM

## 2012-10-24 NOTE — Progress Notes (Signed)
Physical Therapy Late entry for G-code clarification. Refer to prior PT note for full details of eval.   October 27, 2012 0923  PT G-Codes **NOT FOR INPATIENT CLASS**  Functional Assessment Tool Used clinical judgment  Functional Limitation Mobility: Walking and moving around  Mobility: Walking and Moving Around Current Status (W0981) CH  Mobility: Walking and Moving Around Goal Status (X9147) CH  Mobility: Walking and Moving Around Discharge Status [phone]) [facility]    10/24/2012 Veda Canning, PT Pager: [phone]

## 2012-10-24 NOTE — Progress Notes (Signed)
Patient evaluated for long-term disease management services with [facility]. Patient will receive a post discharge transition of care call and evaluated for monthly home visits for assessments and for education. Spoke with patient at bedside who states his son stays with 3 days a week. Consents signed at bedside. Left [facility] Care Mangement packet with patient      Raiford Noble, MSN, RN,BSN [facility] Liaison [phone]

## 2012-10-24 NOTE — Progress Notes (Signed)
PT d/c to home w/son. D.c instructions and medications reviewed with Pt and son by Consulting civil engineer,

## 2012-10-24 NOTE — Progress Notes (Signed)
ANTICOAGULATION CONSULT NOTE - Follow Up Consult  Pharmacy Consult for Coumadin Indication: atrial fibrillation  Allergies  Allergen Reactions  . Amiodarone Hcl     Weakness in muscles   . Amoxicillin     Rash  . Atorvastatin     unknown  . Benazepril Hcl     unknown  . Clonidine Hydrochloride     unknown  . Colesevelam     unknown  . Diltiazem Hcl     unknown  . Ezetimibe     Unknown     Patient Measurements: Height: 6' (182.9 cm) Weight: 152 lb 11.2 oz (69.264 kg) (scale A) IBW/kg (Calculated) : 77.6  Heparin Dosing Weight:   Vital Signs: Temp: 97.1 F (36.2 C) (02/07 0530) Temp src: Oral (02/07 0530) BP: 156/78 mmHg (02/07 0530) Pulse Rate: 65  (02/06 2130)  Labs:  Basename 10/24/12 0520 10/23/12 0620 10/23/12 0011 10/22/12 1822 10/22/12 1444  HGB -- -- -- -- 14.0  HCT -- -- -- -- 41.6  PLT -- -- -- -- 220  APTT -- -- -- -- --  LABPROT 24.9* 25.5* -- -- 24.1*  INR 2.38* 2.46* -- -- 2.28*  HEPARINUNFRC -- -- -- -- --  CREATININE 0.81 0.74 -- -- 0.67  CKTOTAL -- -- -- -- --  CKMB -- -- -- -- --  TROPONINI -- -- <0.30 <0.30 <0.30    Estimated Creatinine Clearance: 63 ml/min (by C-G formula based on Cr of 0.81).  Assessment: 89yom continuing Coumadin for hx Afib. INR (2.38) remains therapeutic on home regimen (5mg  daily) - will continue. - No CBC this AM - No significant bleeding reported  Goal of Therapy:  INR 2-3    Plan:  1. Continue Coumadin 5mg  daily 2. Follow-up AM INR - will plan to change to 3x weekly (MWF) if INR stable in AM  Cleon Dew [phone] 10/24/2012,8:27 AM

## 2012-10-27 ENCOUNTER — Ambulatory Visit (INDEPENDENT_AMBULATORY_CARE_PROVIDER_SITE_OTHER): Payer: Medicare Other | Admitting: Internal Medicine

## 2012-10-27 ENCOUNTER — Encounter: Payer: Self-pay | Admitting: Internal Medicine

## 2012-10-27 VITALS — BP 160/70 | Temp 97.0°F | Resp 16 | Wt 160.0 lb

## 2012-10-27 DIAGNOSIS — I509 Heart failure, unspecified: Secondary | ICD-10-CM

## 2012-10-27 DIAGNOSIS — I119 Hypertensive heart disease without heart failure: Secondary | ICD-10-CM

## 2012-10-27 DIAGNOSIS — J069 Acute upper respiratory infection, unspecified: Secondary | ICD-10-CM

## 2012-10-27 DIAGNOSIS — E039 Hypothyroidism, unspecified: Secondary | ICD-10-CM

## 2012-10-27 DIAGNOSIS — N390 Urinary tract infection, site not specified: Secondary | ICD-10-CM

## 2012-10-27 DIAGNOSIS — I4891 Unspecified atrial fibrillation: Secondary | ICD-10-CM

## 2012-10-27 MED ORDER — TORSEMIDE 20 MG PO TABS: 20.0000 mg | ORAL_TABLET | Freq: Every day | ORAL | Status: DC

## 2012-10-27 MED ORDER — DOXYCYCLINE HYCLATE 100 MG PO TABS: 100.0000 mg | ORAL_TABLET | Freq: Two times a day (BID) | ORAL | Status: DC

## 2012-10-27 NOTE — Progress Notes (Signed)
Subjective:  Post-hosp f/u Doing better C/o yellow nasal d/c and congestion   Cough This is a new problem. The current episode started in the past 7 days. The problem has been rapidly improving. The cough is non-productive. Associated symptoms include shortness of breath and wheezing. Pertinent negatives include no rash or sore throat. The symptoms are aggravated by lying down. He has tried OTC cough suppressant (alka-selter) for the symptoms. The treatment provided no relief. CAD  Shortness of Breath This is a new problem. The current episode started in the past 7 days. The problem has been rapidly improving. Associated symptoms include leg swelling, orthopnea, PND and wheezing. Pertinent negatives include no neck pain, rash or sore throat. The treatment provided no relief. His past medical history is significant for CAD. (CAD)   The patient presents for a follow-up of  chronic A fib, hypertension, chronic dyslipidemia, allergy to wasp stings   F/u blepharospasm BP is elevated at times   BP Readings from Last 3 Encounters:  10/27/12 160/70  10/24/12 126/54  10/22/12 140/90   Wt Readings from Last 3 Encounters:  10/27/12 160 lb (72.576 kg)  10/24/12 152 lb 11.2 oz (69.264 kg)  10/22/12 166 lb (75.297 kg)      Review of Systems  Constitutional: Negative for appetite change, fatigue and unexpected weight change.  HENT: Negative for nosebleeds, congestion, sore throat, sneezing, trouble swallowing and neck pain.   Eyes: Negative for itching and visual disturbance.  Respiratory: Positive for cough, chest tightness, shortness of breath and wheezing.   Cardiovascular: Positive for orthopnea, leg swelling and PND. Negative for palpitations.  Gastrointestinal: Negative for nausea, diarrhea, blood in stool and abdominal distention.  Genitourinary: Negative for frequency and hematuria.  Musculoskeletal: Negative for back pain, joint swelling and gait problem.  Skin: Negative for rash.   Neurological: Negative for dizziness, tremors, speech difficulty and weakness.  Psychiatric/Behavioral: Negative for sleep disturbance, dysphoric mood and agitation. The patient is not nervous/anxious.        Objective:   Physical Exam  Constitutional: He is oriented to person, place, and time. He appears well-developed. He appears distressed.  Mild resp distress  HENT:  Mouth/Throat: Oropharynx is clear and moist.  Eyes: Conjunctivae are normal. Pupils are equal, round, and reactive to light.  Neck: Normal range of motion. No JVD present. No thyromegaly present.  Cardiovascular: Intact distal pulses.  Exam reveals no gallop and no friction rub.   Murmur heard. Pulmonary/Chest: Effort normal. No respiratory distress. He has no wheezes. He has rales (B). He exhibits no tenderness.  Abdominal: Soft. Bowel sounds are normal. He exhibits no distension and no mass. There is no tenderness. There is no rebound and no guarding.  Musculoskeletal: Normal range of motion. He exhibits edema (2+ B). He exhibits no tenderness.  Lymphadenopathy:    He has no cervical adenopathy.  Neurological: He is alert and oriented to person, place, and time. He has normal reflexes. No cranial nerve deficit. He exhibits normal muscle tone. Coordination normal.  Skin: Skin is warm and dry. No rash noted.  Psychiatric: He has a normal mood and affect. His behavior is normal. Judgment and thought content normal.   Lab Results  Component Value Date   WBC 7.4 10/22/2012   HGB 14.0 10/22/2012   HCT 41.6 10/22/2012   PLT 220 10/22/2012   GLUCOSE 96 10/24/2012   CHOL 164 11/08/2011   TRIG 47.0 11/08/2011   HDL 68.10 11/08/2011   LDLCALC 87 11/08/2011  ALT 21 10/22/2012   AST 31 10/22/2012   NA 138 10/24/2012   K 4.1 10/24/2012   CL 98 10/24/2012   CREATININE 0.81 10/24/2012   BUN 19 10/24/2012   CO2 33* 10/24/2012   TSH 0.825 10/22/2012   PSA 0.81 02/20/2012   INR 2.38* 10/24/2012         Assessment & Plan:

## 2012-10-27 NOTE — Patient Instructions (Signed)
Heart Failure  Heart failure (HF) is a condition in which the heart has trouble pumping blood. This means your heart does not pump blood efficiently for your body to work well. In some cases of HF, fluid may back up into your lungs or you may have swelling (edema) in your lower legs. HF is a long-term (chronic) condition. It is important for you to take good care of yourself and follow your caregiver's treatment plan.  CAUSES   · Health conditions:  · High blood pressure (hypertension) causes the heart muscle to work harder than normal. When pressure in the blood vessels is high, the heart needs to pump (contract) with more force in order to circulate blood throughout the body. High blood pressure eventually causes the heart to become stiff and weak.  · Coronary artery disease (CAD) is the buildup of cholesterol and fat (plaques) in the arteries of the heart. The blockage in the arteries deprives the heart muscle of oxygen and blood. This can cause chest pain and may lead to a heart attack. High blood pressure can also contribute to CAD.  · Heart attack (myocardial infarction) occurs when 1 or more arteries in the heart become blocked. The loss of oxygen damages the muscle tissue of the heart. When this happens, part of the heart muscle dies. The injured tissue does not contract as well and weakens the heart's ability to pump blood.  · Abnormal heart valves can cause HF when the heart valves do not open and close properly. This makes the heart muscle pump harder to keep the blood flowing.  · Heart muscle disease (cardiomyopathy or myocarditis) is damage to the heart muscle from a variety of causes. These can include drug or alcohol abuse, infections, or unknown reasons. These can increase the risk of HF.  · Lung disease makes the heart work harder because the lungs do not work properly. This can cause a strain on the heart leading it to fail.  · Diabetes increases the risk of HF. High blood sugar contributes to high  fat (lipid) levels in the blood. Diabetes can also cause slow damage to tiny blood vessels that carry important nutrients to the heart muscle. When the heart does not get enough oxygen and food, it can cause the heart to become weak and stiff. This leads to a heart that does not contract efficiently.  · Other diseases can contribute to HF. These include abnormal heart rhythms, thyroid problems, and low blood counts (anemia).  · Unhealthy lifestyle habits:  · Obesity.  · Smoking.  · Eating foods high in fat and cholesterol.  · Eating or drinking beverages high in salt.  · Drug or alcohol abuse.  · Lack of exercise.  SYMPTOMS   HF symptoms may vary and can be hard to detect. Symptoms may include:  · Shortness of breath with activity, such as climbing stairs.  · Persistent cough.  · Swelling of the feet, ankles, legs, or abdomen.  · Unexplained weight gain.  · Difficulty breathing when lying flat.  · Waking from sleep because of the need to sit up and get more air.  · Rapid heartbeat.  · Fatigue and loss of energy.  · Feeling lightheaded or close to fainting.  DIAGNOSIS   A diagnosis of HF is based on your history, symptoms, physical examination, and diagnostic tests.  Diagnostic tests for HF may include:  · EKG.  · Chest X-ray.  · Blood tests.  · Exercise stress test.  · Blood   oxygen test (arterial blood gas).  · Evaluation by a heart doctor (cardiologist).  · Ultrasound evaluation of the heart (echocardiogram).  · Heart artery test to look for blockages (angiogram).  · Radioactive imaging to look at the heart (radionuclide test).  TREATMENT   Treatment is aimed at managing the symptoms of HF. Medicines, lifestyle changes, or surgical intervention may be necessary to treat HF.  · Medicines to help treat HF may include:  · Angiotensin-converting enzyme (ACE) inhibitors. These block the effects of a blood protein called angiotensin-converting enzyme. ACE inhibitors relax (dilate) the blood vessels and help lower blood  pressure. This decreases the workload of the heart, slows the progression of HF, and improves symptoms.  · Angiotensin receptor blockers (ARBs). These medications work similar to ACE inhibitors. ARBs may be an alternative for people who cannot tolerate an ACE inhibitor.  · Aldosterone antagonists. This medication helps get rid of extra fluid from your body. This lowers the volume of blood the heart has to pump.  · Water pills (diuretics). Diuretics cause the kidneys to remove salt and water from the blood. The extra fluid is removed by urination. By removing extra fluid from the body, diuretics help lower the workload of the heart and help prevent fluid buildup in the lungs so breathing is easier.  · Beta blockers. These prevent the heart from beating too fast and improve heart muscle strength. Beta blockers help maintain a normal heart rate, control blood pressure, and improve HF symptoms.  · Digitalis. This increases the force of the heartbeat and may be helpful to people with HF or heart rhythm problems.  · Healthy lifestyle changes include:  · Stopping smoking.  · Eating a healthy diet. Avoid foods high in fat. Avoid foods fried in oil or made with fat. A dietician can help with healthy food choices.  · Limiting how much salt you eat.  · Limiting alcohol intake to no more than 1 drink per day for women and 2 drinks per day for men. Drinking more than that is harmful to your heart. If your heart has already been damaged by alcohol or you have severe HF, drinking alcohol should be stopped completely.  · Exercising as directed by your caregiver.  · Surgical treatment for HF may include:  · Procedures to open blocked arteries, repair damaged heart valves, or remove damaged heart muscle tissue.  · A pacemaker to help heart muscle function and to control certain abnormal heart rhythms.  · A defibrillator to possibly prevent sudden cardiac death.  HOME CARE INSTRUCTIONS   · Activity level. Your caregiver can help you  determine what type of exercise program may be helpful. It is important to maintain your strength. Pace your physical activity to avoid shortness of breath or chest pain. Rest for 1 hour before and after meals. A cardiac rehabilitation program may be helpful to some people with HF.  · Diet. Eat a heart healthy diet. Food choices should be low in saturated fat and cholesterol. Talk to a dietician to learn about heart healthy foods.  · Salt intake. When you have HF, you need to limit the amount of salt you eat. Eat less than 1500 milligrams (mg) of salt per day or as recommended by your caregiver.  · Weight monitoring. Weigh yourself every day. You should weigh yourself in the morning after you urinate and before you eat breakfast. Wear the same amount of clothing each time you weigh yourself. Record your weight daily. Bring your recorded   weights to your clinic visits. Tell your caregiver right away if you have gained 3 lb/1.4 kg in 1 day, or 5 lb/2.3 kg in a week or whatever amount you were told to report.  · Blood pressure monitoring. This should be done as directed by your caregiver. A home blood pressure cuff can be purchased at a drugstore. Record your blood pressure numbers and bring them to your clinic visits. Tell your caregiver if you become dizzy or lightheaded upon standing up.  · Smoking. If you are currently a smoker, it is time to quit. Nicotine makes your heart work harder by causing your blood vessels to constrict. Do not use nicotine gum or patches before talking to your caregiver.  · Follow up. Be sure to schedule a follow-up visit with your caregiver. Keep all your appointments.  SEEK MEDICAL CARE IF:   · Your weight increases by 3 lb/1.4 kg in 1 day or 5 lb/2.3 kg in a week.  · You notice increasing shortness of breath that is unusual for you. This may happen during rest, sleep, or with activity.  · You cough more than normal, especially with physical activity.  · You notice more swelling in your  hands, feet, ankles, or belly (abdomen).  · You are unable to sleep because it is hard to breathe.  · You cough up bloody mucus (sputum).  · You begin to feel "jumping" or "fluttering" sensations (palpitations) in your chest.  SEEK IMMEDIATE MEDICAL CARE IF:   · You have severe chest pain or pressure which may include symptoms such as:  · Pain or pressure in the arms, neck, jaw, or back.  · Feeling sweaty.  · Feeling sick to your stomach (nauseous).  · Feeling short of breath while at rest.  · Having a fast or irregular heartbeat.  · You experience stroke symptoms. These symptoms include:  · Facial weakness or numbness.  · Weakness or numbness in an arm, leg, or on one side of your body.  · Blurred vision.  · Difficulty talking or thinking.  · Dizziness or fainting.  · Severe headache.  MAKE SURE YOU:   · Understand these instructions.  · Will watch your condition.  · Will get help right away if you are not doing well or get worse.  Document Released: 09/03/2005 Document Revised: 03/04/2012 Document Reviewed: 12/16/2009  [employer]® Patient Information ©2013 [employer].

## 2012-10-28 ENCOUNTER — Ambulatory Visit (INDEPENDENT_AMBULATORY_CARE_PROVIDER_SITE_OTHER): Payer: Medicare Other | Admitting: Internal Medicine

## 2012-10-28 ENCOUNTER — Encounter: Payer: Self-pay | Admitting: Internal Medicine

## 2012-10-28 ENCOUNTER — Telehealth: Payer: Self-pay | Admitting: Internal Medicine

## 2012-10-28 VITALS — BP 130/70 | HR 65 | Ht 72.0 in | Wt 157.8 lb

## 2012-10-28 DIAGNOSIS — I509 Heart failure, unspecified: Secondary | ICD-10-CM

## 2012-10-28 DIAGNOSIS — I442 Atrioventricular block, complete: Secondary | ICD-10-CM

## 2012-10-28 DIAGNOSIS — Z950 Presence of cardiac pacemaker: Secondary | ICD-10-CM

## 2012-10-28 DIAGNOSIS — I4891 Unspecified atrial fibrillation: Secondary | ICD-10-CM

## 2012-10-28 DIAGNOSIS — I119 Hypertensive heart disease without heart failure: Secondary | ICD-10-CM

## 2012-10-28 LAB — PACEMAKER DEVICE OBSERVATION
ATRIAL PACING PM: 1
BAMS-0001: 160 {beats}/min
BRDY-0002RV: 65 {beats}/min
BRDY-0004RV: 125 {beats}/min
DEVICE MODEL PM: 1647680
VENTRICULAR PACING PM: 100

## 2012-10-28 NOTE — Assessment & Plan Note (Addendum)
The patient presented with volume overload and responded to diuretics. We will need to assess left ventricular function. We will get an echo. He is currently euvolemic

## 2012-10-28 NOTE — Telephone Encounter (Signed)
I left a message for Ricky [name] to call. 

## 2012-10-28 NOTE — Assessment & Plan Note (Signed)
Stable

## 2012-10-28 NOTE — Progress Notes (Signed)
Patient Care Team: Tresa Garter, MD as PCP - General Duke Salvia, MD (Cardiology)   HPI  Ricky [name] is a 79 y.o. male is seen in followup for atrial fibrillation and a permanent. He is status post AV junction ablation following 2 failed PPIs. He is status post pacemaker implantation and his device dependent.    He says into the area from where he developed a cold and then subsequently significant edema. He was hospitalized for congestive heart failure for a few days and put on diuretics. Medical record describes "diastolic heart failure" but there is no assessment of left ventricular function I can find.  He is currently feeling better     Past Medical History  Diagnosis Date  . HYPOTHYROIDISM 09/04/2007  . ANXIETY 04/26/2008  . Essential hypertension, benign 10/03/2007  . LYMPHADENOPATHY 04/06/2009  . TOBACCO USE, QUIT 07/14/2009  . Atrial fibrillation 10/31/2010    Coumadin therapy;  Echo 6/12: EF 50-55%, moderate MR, moderate TR, PASP 52, mild LAE  . Pacemaker     Status post AV nodal ablation    Past Surgical History  Procedure Laterality Date  . Pacemaker insertion  10/16/05    Current Outpatient Prescriptions  Medication Sig Dispense Refill  . carvedilol (COREG) 25 MG tablet Take 1 tablet (25 mg total) by mouth 2 (two) times daily.  180 tablet  3  . Cholecalciferol 1000 UNITS tablet Take 1,000 Units by mouth daily.        . diphenhydrAMINE (BENADRYL) 25 mg capsule Take 1 capsule (25 mg total) by mouth every 4 (four) hours as needed for itching (1-2 for bee stings).  30 capsule  2  . doxycycline (VIBRA-TABS) 100 MG tablet Take 1 tablet (100 mg total) by mouth 2 (two) times daily.  20 tablet  0  . EPINEPHrine (EPIPEN 2-PAK) 0.3 mg/0.3 mL DEVI Inject 0.3 mg into the muscle once as needed. For allergic reaction      . irbesartan (AVAPRO) 300 MG tablet Take 1 tablet (300 mg total) by mouth at bedtime.  90 tablet  3  . levothyroxine (SYNTHROID, LEVOTHROID) 75  MCG tablet Take 75 mcg by mouth daily.      . Multiple Vitamin (MULTIVITAMIN) tablet Take 1 tablet by mouth daily.        . nitroGLYCERIN (NITROSTAT) 0.4 MG SL tablet Place 0.4 mg under the tongue every 5 (five) minutes as needed. For chest pain      . torsemide (DEMADEX) 20 MG tablet Take 1-2 tablets (20-40 mg total) by mouth daily.  60 tablet  11  . warfarin (COUMADIN) 5 MG tablet Take 1 tablet (5 mg total) by mouth daily. Take as directed by coumadin clinic.  90 tablet  1   No current facility-administered medications for this visit.    Allergies  Allergen Reactions  . Amiodarone Hcl     Weakness in muscles   . Amoxicillin     Rash  . Atorvastatin     unknown  . Benazepril Hcl     unknown  . Clonidine Hydrochloride     unknown  . Colesevelam     unknown  . Diltiazem Hcl     unknown  . Ezetimibe     Unknown     Review of Systems negative except from HPI and PMH  Physical Exam BP 130/70  Pulse 65  Ht 6' (1.829 m)  Wt 157 lb 12.8 oz (71.578 kg)  BMI 21.4 kg/m2 Well developed and well nourished  in no acute distress HENT normal E scleral and icterus clear Neck Supple JVP 8-10t; carotids brisk and full Clear to ausculation  Regular rate and rhythm, no murmurs gallops or rub Soft with active bowel sounds No clubbing cyanosis Trace Edema Alert and oriented, grossly normal motor and sensory function Skin Warm and Dry

## 2012-10-28 NOTE — Assessment & Plan Note (Signed)
Stable post pacingstable

## 2012-10-28 NOTE — Assessment & Plan Note (Signed)
permanent

## 2012-10-28 NOTE — Patient Instructions (Addendum)
Your physician wants you to follow-up in: 6 months with Kristin/Paula for a device check & 1 year with Dr. Graciela Husbands. You will receive a reminder letter in the mail two months in advance. If you don't receive a letter, please call our office to schedule the follow-up appointment.  Your physician has requested that you have an echocardiogram. Echocardiography is a painless test that uses sound waves to create images of your heart. It provides your doctor with information about the size and shape of your heart and how well your heart's chambers and valves are working. This procedure takes approximately one hour. There are no restrictions for this procedure.  Your physician recommends that you continue on your current medications as directed. Please refer to the Current Medication list given to you today.

## 2012-10-28 NOTE — Assessment & Plan Note (Signed)
The patient's device was interrogated.  The information was reviewed. No changes were made in the programming.    

## 2012-10-28 NOTE — Telephone Encounter (Signed)
Pt needs a scale that will ask HF questions and she wants to talk to you to get approval from Dr. Graciela Husbands to help prevent hospitalization. She also needs last EF if we have it because pt mentioned he has another echo coming up soon and they would like to have the comparison

## 2012-10-31 ENCOUNTER — Encounter: Payer: Self-pay | Admitting: Internal Medicine

## 2012-10-31 DIAGNOSIS — J069 Acute upper respiratory infection, unspecified: Secondary | ICD-10-CM | POA: Insufficient documentation

## 2012-10-31 NOTE — Assessment & Plan Note (Signed)
Doxy x 10 d 

## 2012-10-31 NOTE — Assessment & Plan Note (Signed)
Continue with current prescription therapy as reflected on the Med list.  

## 2012-10-31 NOTE — Assessment & Plan Note (Signed)
Improved - doing well Switched to [employer]

## 2012-11-03 ENCOUNTER — Encounter: Payer: Self-pay | Admitting: Internal Medicine

## 2012-11-03 ENCOUNTER — Ambulatory Visit (HOSPITAL_COMMUNITY): Payer: Medicare Other | Attending: Internal Medicine | Admitting: Radiology

## 2012-11-03 DIAGNOSIS — I079 Rheumatic tricuspid valve disease, unspecified: Secondary | ICD-10-CM | POA: Insufficient documentation

## 2012-11-03 DIAGNOSIS — I509 Heart failure, unspecified: Secondary | ICD-10-CM

## 2012-11-03 DIAGNOSIS — R0789 Other chest pain: Secondary | ICD-10-CM | POA: Insufficient documentation

## 2012-11-03 DIAGNOSIS — E039 Hypothyroidism, unspecified: Secondary | ICD-10-CM | POA: Insufficient documentation

## 2012-11-03 DIAGNOSIS — Z950 Presence of cardiac pacemaker: Secondary | ICD-10-CM

## 2012-11-03 DIAGNOSIS — I4891 Unspecified atrial fibrillation: Secondary | ICD-10-CM

## 2012-11-03 DIAGNOSIS — I10 Essential (primary) hypertension: Secondary | ICD-10-CM | POA: Insufficient documentation

## 2012-11-03 DIAGNOSIS — I442 Atrioventricular block, complete: Secondary | ICD-10-CM

## 2012-11-03 NOTE — Telephone Encounter (Signed)
I spoke with Hospital doctor at [facility]. Last EF was given to her from 02/2011. She will work on getting his scale to him.

## 2012-11-03 NOTE — Progress Notes (Signed)
Echocardiogram performed.  

## 2012-11-12 ENCOUNTER — Telehealth: Payer: Self-pay | Admitting: Internal Medicine

## 2012-11-12 NOTE — Telephone Encounter (Signed)
Echo results given 

## 2012-11-12 NOTE — Telephone Encounter (Signed)
Follow Up    Pt is following up on ECHO results.

## 2012-11-26 ENCOUNTER — Other Ambulatory Visit (INDEPENDENT_AMBULATORY_CARE_PROVIDER_SITE_OTHER): Payer: Medicare Other

## 2012-11-26 ENCOUNTER — Ambulatory Visit (INDEPENDENT_AMBULATORY_CARE_PROVIDER_SITE_OTHER): Payer: Medicare Other | Admitting: Internal Medicine

## 2012-11-26 ENCOUNTER — Encounter: Payer: Self-pay | Admitting: Internal Medicine

## 2012-11-26 ENCOUNTER — Ambulatory Visit (INDEPENDENT_AMBULATORY_CARE_PROVIDER_SITE_OTHER): Payer: Medicare Other | Admitting: General Practice

## 2012-11-26 VITALS — BP 140/82 | HR 80 | Temp 96.6°F | Resp 16 | Wt 158.0 lb

## 2012-11-26 DIAGNOSIS — I4891 Unspecified atrial fibrillation: Secondary | ICD-10-CM

## 2012-11-26 DIAGNOSIS — I5032 Chronic diastolic (congestive) heart failure: Secondary | ICD-10-CM | POA: Insufficient documentation

## 2012-11-26 DIAGNOSIS — I509 Heart failure, unspecified: Secondary | ICD-10-CM

## 2012-11-26 DIAGNOSIS — F411 Generalized anxiety disorder: Secondary | ICD-10-CM

## 2012-11-26 DIAGNOSIS — Z7901 Long term (current) use of anticoagulants: Secondary | ICD-10-CM

## 2012-11-26 DIAGNOSIS — E039 Hypothyroidism, unspecified: Secondary | ICD-10-CM

## 2012-11-26 LAB — BASIC METABOLIC PANEL
CO2: 34 mEq/L — ABNORMAL HIGH (ref 19–32)
Chloride: 100 mEq/L (ref 96–112)
Glucose, Bld: 74 mg/dL (ref 70–99)
Potassium: 4.8 mEq/L (ref 3.5–5.1)
Sodium: 138 mEq/L (ref 135–145)

## 2012-11-26 MED ORDER — CARVEDILOL 25 MG PO TABS: 25.0000 mg | ORAL_TABLET | Freq: Two times a day (BID) | ORAL | Status: DC

## 2012-11-26 MED ORDER — TORSEMIDE 20 MG PO TABS: 20.0000 mg | ORAL_TABLET | Freq: Every day | ORAL | Status: DC

## 2012-11-26 NOTE — Progress Notes (Signed)
   Subjective:     HPI  The patient presents for a follow-up of  chronic A fib, hypertension, chronic dyslipidemia, allergy to wasp stings   F/u blepharospasm BP is elevated at times   BP Readings from Last 3 Encounters:  11/26/12 140/82  10/28/12 130/70  10/27/12 160/70   Wt Readings from Last 3 Encounters:  11/26/12 158 lb (71.668 kg)  10/28/12 157 lb 12.8 oz (71.578 kg)  10/27/12 160 lb (72.576 kg)      Review of Systems  Constitutional: Negative for appetite change, fatigue and unexpected weight change.  HENT: Negative for nosebleeds, congestion, sneezing and trouble swallowing.   Eyes: Negative for itching and visual disturbance.  Respiratory: Positive for chest tightness.   Cardiovascular: Negative for palpitations.  Gastrointestinal: Negative for nausea, diarrhea, blood in stool and abdominal distention.  Genitourinary: Negative for frequency and hematuria.  Musculoskeletal: Negative for back pain, joint swelling and gait problem.  Neurological: Negative for dizziness, tremors, speech difficulty and weakness.  Psychiatric/Behavioral: Negative for sleep disturbance, dysphoric mood and agitation. The patient is not nervous/anxious.        Objective:   Physical Exam  Constitutional: He is oriented to person, place, and time. He appears well-developed. He appears distressed.  Mild resp distress  HENT:  Mouth/Throat: Oropharynx is clear and moist.  Eyes: Conjunctivae are normal. Pupils are equal, round, and reactive to light.  Neck: Normal range of motion. No JVD present. No thyromegaly present.  Cardiovascular: Intact distal pulses.  Exam reveals no gallop and no friction rub.   Murmur heard. Pulmonary/Chest: Effort normal. No respiratory distress. He has no wheezes. He has rales (B). He exhibits no tenderness.  Abdominal: Soft. Bowel sounds are normal. He exhibits no distension and no mass. There is no tenderness. There is no rebound and no guarding.   Musculoskeletal: Normal range of motion. He exhibits edema (2+ B). He exhibits no tenderness.  Lymphadenopathy:    He has no cervical adenopathy.  Neurological: He is alert and oriented to person, place, and time. He has normal reflexes. No cranial nerve deficit. He exhibits normal muscle tone. Coordination normal.  Skin: Skin is warm and dry. No rash noted.  Psychiatric: He has a normal mood and affect. His behavior is normal. Judgment and thought content normal.   Lab Results  Component Value Date   WBC 7.4 10/22/2012   HGB 14.0 10/22/2012   HCT 41.6 10/22/2012   PLT 220 10/22/2012   GLUCOSE 96 10/24/2012   CHOL 164 11/08/2011   TRIG 47.0 11/08/2011   HDL 68.10 11/08/2011   LDLCALC 87 11/08/2011   ALT 21 10/22/2012   AST 31 10/22/2012   NA 138 10/24/2012   K 4.1 10/24/2012   CL 98 10/24/2012   CREATININE 0.81 10/24/2012   BUN 19 10/24/2012   CO2 33* 10/24/2012   TSH 0.825 10/22/2012   PSA 0.81 02/20/2012   INR 2.38* 10/24/2012         Assessment & Plan:

## 2012-11-26 NOTE — Assessment & Plan Note (Signed)
Continue with current prescription therapy as reflected on the Med list.  

## 2012-11-26 NOTE — Assessment & Plan Note (Signed)
Resolved

## 2012-11-26 NOTE — Assessment & Plan Note (Signed)
Doing well 

## 2013-01-07 ENCOUNTER — Ambulatory Visit (INDEPENDENT_AMBULATORY_CARE_PROVIDER_SITE_OTHER): Payer: Medicare Other | Admitting: General Practice

## 2013-01-07 DIAGNOSIS — Z7901 Long term (current) use of anticoagulants: Secondary | ICD-10-CM

## 2013-01-07 DIAGNOSIS — I4891 Unspecified atrial fibrillation: Secondary | ICD-10-CM

## 2013-02-18 ENCOUNTER — Ambulatory Visit (INDEPENDENT_AMBULATORY_CARE_PROVIDER_SITE_OTHER): Payer: Medicare Other | Admitting: Family Medicine

## 2013-02-18 DIAGNOSIS — Z7901 Long term (current) use of anticoagulants: Secondary | ICD-10-CM

## 2013-02-18 DIAGNOSIS — I4891 Unspecified atrial fibrillation: Secondary | ICD-10-CM

## 2013-02-25 ENCOUNTER — Encounter: Payer: Self-pay | Admitting: Internal Medicine

## 2013-02-25 ENCOUNTER — Ambulatory Visit (INDEPENDENT_AMBULATORY_CARE_PROVIDER_SITE_OTHER): Payer: Medicare Other | Admitting: Internal Medicine

## 2013-02-25 VITALS — BP 135/75 | HR 68 | Resp 16

## 2013-02-25 DIAGNOSIS — I5022 Chronic systolic (congestive) heart failure: Secondary | ICD-10-CM

## 2013-02-25 DIAGNOSIS — I4891 Unspecified atrial fibrillation: Secondary | ICD-10-CM

## 2013-02-25 DIAGNOSIS — E039 Hypothyroidism, unspecified: Secondary | ICD-10-CM

## 2013-02-25 DIAGNOSIS — G245 Blepharospasm: Secondary | ICD-10-CM

## 2013-02-25 DIAGNOSIS — I509 Heart failure, unspecified: Secondary | ICD-10-CM

## 2013-02-25 NOTE — Assessment & Plan Note (Signed)
On Botox inj

## 2013-02-25 NOTE — Assessment & Plan Note (Signed)
Continue with current prescription therapy as reflected on the Med list.  

## 2013-02-25 NOTE — Progress Notes (Signed)
   Subjective:     HPI  The patient presents for a follow-up of  chronic A fib, hypertension, chronic dyslipidemia, allergy to wasp stings   F/u blepharospasm BP is elevated at times, nl at home No CHF   BP Readings from Last 3 Encounters:  02/25/13 160/78  11/26/12 140/82  10/28/12 130/70   Wt Readings from Last 3 Encounters:  11/26/12 158 lb (71.668 kg)  10/28/12 157 lb 12.8 oz (71.578 kg)  10/27/12 160 lb (72.576 kg)      Review of Systems  Constitutional: Negative for appetite change, fatigue and unexpected weight change.  HENT: Negative for nosebleeds, congestion, sneezing and trouble swallowing.   Eyes: Negative for itching and visual disturbance.  Respiratory: Positive for chest tightness.   Cardiovascular: Negative for palpitations.  Gastrointestinal: Negative for nausea, diarrhea, blood in stool and abdominal distention.  Genitourinary: Negative for frequency and hematuria.  Musculoskeletal: Negative for back pain, joint swelling and gait problem.  Neurological: Negative for dizziness, tremors, speech difficulty and weakness.  Psychiatric/Behavioral: Negative for sleep disturbance, dysphoric mood and agitation. The patient is not nervous/anxious.        Objective:   Physical Exam  Constitutional: He is oriented to person, place, and time. He appears well-developed. He appears distressed.  Mild resp distress  HENT:  Mouth/Throat: Oropharynx is clear and moist.  Eyes: Conjunctivae are normal. Pupils are equal, round, and reactive to light.  Neck: Normal range of motion. No JVD present. No thyromegaly present.  Cardiovascular: Intact distal pulses.  Exam reveals no gallop and no friction rub.   Murmur heard. Pulmonary/Chest: Effort normal. No respiratory distress. He has no wheezes. He has rales (B). He exhibits no tenderness.  Abdominal: Soft. Bowel sounds are normal. He exhibits no distension and no mass. There is no tenderness. There is no rebound and no  guarding.  Musculoskeletal: Normal range of motion. He exhibits edema (2+ B). He exhibits no tenderness.  Lymphadenopathy:    He has no cervical adenopathy.  Neurological: He is alert and oriented to person, place, and time. He has normal reflexes. No cranial nerve deficit. He exhibits normal muscle tone. Coordination normal.  Skin: Skin is warm and dry. No rash noted.  Psychiatric: He has a normal mood and affect. His behavior is normal. Judgment and thought content normal.   Lab Results  Component Value Date   WBC 7.4 10/22/2012   HGB 14.0 10/22/2012   HCT 41.6 10/22/2012   PLT 220 10/22/2012   GLUCOSE 74 11/26/2012   CHOL 164 11/08/2011   TRIG 47.0 11/08/2011   HDL 68.10 11/08/2011   LDLCALC 87 11/08/2011   ALT 21 10/22/2012   AST 31 10/22/2012   NA 138 11/26/2012   K 4.8 11/26/2012   CL 100 11/26/2012   CREATININE 0.9 11/26/2012   BUN 25* 11/26/2012   CO2 34* 11/26/2012   TSH 0.71 11/26/2012   PSA 0.81 02/20/2012   INR 2.0 02/18/2013         Assessment & Plan:

## 2013-02-25 NOTE — Assessment & Plan Note (Signed)
No relapse Continue with current prescription therapy as reflected on the Med list.  

## 2013-02-25 NOTE — Assessment & Plan Note (Signed)
Doing well 

## 2013-03-16 ENCOUNTER — Encounter: Payer: Self-pay | Admitting: Internal Medicine

## 2013-03-16 ENCOUNTER — Ambulatory Visit (INDEPENDENT_AMBULATORY_CARE_PROVIDER_SITE_OTHER): Payer: Medicare Other | Admitting: Internal Medicine

## 2013-03-16 VITALS — BP 160/90 | HR 80 | Temp 97.9°F | Resp 16 | Wt 158.0 lb

## 2013-03-16 DIAGNOSIS — R1903 Right lower quadrant abdominal swelling, mass and lump: Secondary | ICD-10-CM

## 2013-03-16 DIAGNOSIS — Z7901 Long term (current) use of anticoagulants: Secondary | ICD-10-CM

## 2013-03-16 NOTE — Assessment & Plan Note (Addendum)
R groin/RLQ swelling c/w hernia POC [place] S[facility] ref Dr Derrell Lolling

## 2013-03-16 NOTE — Patient Instructions (Addendum)
Go to ER if severe painHernia A hernia occurs when an internal organ pushes out through a weak spot in the abdominal wall. Hernias most commonly occur in the groin and around the navel. Hernias often can be pushed back into place (reduced). Most hernias tend to get worse over time. Some abdominal hernias can get stuck in the opening (irreducible or incarcerated hernia) and cannot be reduced. An irreducible abdominal hernia which is tightly squeezed into the opening is at risk for impaired blood supply (strangulated hernia). A strangulated hernia is a medical emergency. Because of the risk for an irreducible or strangulated hernia, surgery may be recommended to repair a hernia. CAUSES   Heavy lifting.  Prolonged coughing.  Straining to have a bowel movement.  A cut (incision) made during an abdominal surgery. HOME CARE INSTRUCTIONS   Bed rest is not required. You may continue your normal activities.  Avoid lifting more than 10 pounds (4.5 kg) or straining.  Cough gently. If you are a smoker it is best to stop. Even the best hernia repair can break down with the continual strain of coughing. Even if you do not have your hernia repaired, a cough will continue to aggravate the problem.  Do not wear anything tight over your hernia. Do not try to keep it in with an outside bandage or truss. These can damage abdominal contents if they are trapped within the hernia sac.  Eat a normal diet.  Avoid constipation. Straining over long periods of time will increase hernia size and encourage breakdown of repairs. If you cannot do this with diet alone, stool softeners may be used. SEEK IMMEDIATE MEDICAL CARE IF:   You have a fever.  You develop increasing abdominal pain.  You feel nauseous or vomit.  Your hernia is stuck outside the abdomen, looks discolored, feels hard, or is tender.  You have any changes in your bowel habits or in the hernia that are unusual for you.  You have increased pain or  swelling around the hernia.  You cannot push the hernia back in place by applying gentle pressure while lying down. MAKE SURE YOU:   Understand these instructions.  Will watch your condition.  Will get help right away if you are not doing well or get worse. Document Released: 09/03/2005 Document Revised: 11/26/2011 Document Reviewed: 04/22/2008 [facility] Patient Information 2014 [place], [place].

## 2013-03-16 NOTE — Progress Notes (Signed)
Subjective:     HPI  C/o mild pain and swelling in the R groin x 1 week  The patient presents for a follow-up of  chronic A fib, hypertension, chronic dyslipidemia, allergy to wasp stings   F/u blepharospasm BP is elevated at times, nl at home No CHF   BP Readings from Last 3 Encounters:  03/16/13 160/90  02/25/13 135/75  11/26/12 140/82   Wt Readings from Last 3 Encounters:  03/16/13 158 lb (71.668 kg)  11/26/12 158 lb (71.668 kg)  10/28/12 157 lb 12.8 oz (71.578 kg)      Review of Systems  Constitutional: Negative for appetite change, fatigue and unexpected weight change.  HENT: Negative for nosebleeds, congestion, sneezing and trouble swallowing.   Eyes: Negative for itching and visual disturbance.  Respiratory: Positive for chest tightness.   Cardiovascular: Negative for palpitations.  Gastrointestinal: Negative for nausea, diarrhea, blood in stool and abdominal distention.  Genitourinary: Negative for frequency and hematuria.  Musculoskeletal: Negative for back pain, joint swelling and gait problem.  Neurological: Negative for dizziness, tremors, speech difficulty and weakness.  Psychiatric/Behavioral: Negative for sleep disturbance, dysphoric mood and agitation. The patient is not nervous/anxious.        Objective:   Physical Exam  Constitutional: He is oriented to person, place, and time. He appears well-developed. He appears distressed.  Mild resp distress  HENT:  Mouth/Throat: Oropharynx is clear and moist.  Eyes: Conjunctivae are normal. Pupils are equal, round, and reactive to light.  Neck: Normal range of motion. No JVD present. No thyromegaly present.  Cardiovascular: Intact distal pulses.  Exam reveals no gallop and no friction rub.   Murmur heard. Pulmonary/Chest: Effort normal. No respiratory distress. He has no wheezes. He has rales (B). He exhibits no tenderness.  Abdominal: Soft. Bowel sounds are normal. He exhibits no distension and no mass.  There is no tenderness. There is no rebound and no guarding.  Musculoskeletal: Normal range of motion. He exhibits edema (2+ B). He exhibits no tenderness.  Lymphadenopathy:    He has no cervical adenopathy.  Neurological: He is alert and oriented to person, place, and time. He has normal reflexes. No cranial nerve deficit. He exhibits normal muscle tone. Coordination normal.  Skin: Skin is warm and dry. No rash noted.  Psychiatric: He has a normal mood and affect. His behavior is normal. Judgment and thought content normal.   R groin/RLQ swelling c/w hernia Lab Results  Component Value Date   WBC 7.4 10/22/2012   HGB 14.0 10/22/2012   HCT 41.6 10/22/2012   PLT 220 10/22/2012   GLUCOSE 74 11/26/2012   CHOL 164 11/08/2011   TRIG 47.0 11/08/2011   HDL 68.10 11/08/2011   LDLCALC 87 11/08/2011   ALT 21 10/22/2012   AST 31 10/22/2012   NA 138 11/26/2012   K 4.8 11/26/2012   CL 100 11/26/2012   CREATININE 0.9 11/26/2012   BUN 25* 11/26/2012   CO2 34* 11/26/2012   TSH 0.71 11/26/2012   PSA 0.81 02/20/2012   INR 2.0 02/18/2013   Procedure Note :    Procedure :   Point of care (POC) sonography examination   Indication: R groin/RLQ swelling/mass   Equipment used: Sonosite M-Turbo with HFL38x/13-6 MHz transducer linear probe. The images were stored in the unit and later transferred in storage.  The patient was placed in a standing position.  This study revealed a R groin/RLQ mobile subcutaneous struture c/w intestine (hernia)  Impression: This study revealed a  R groin/RLQ mobile subcutaneous struture c/w intestine (hernia)          Assessment & Plan:

## 2013-03-16 NOTE — Assessment & Plan Note (Signed)
Per coumadin clinic.

## 2013-03-23 ENCOUNTER — Telehealth: Payer: Self-pay | Admitting: *Deleted

## 2013-03-23 NOTE — Telephone Encounter (Signed)
Pt sent a fax stating his referral with Dr Derrell Lolling at [facility] was made with Dr. Jamey Ripa on 03/27/13. He wants to know if Dr. Jamey Ripa on the 11 th is ok or should he want until 04/10/13 when Dr. Derrell Lolling will be available. Please advise.

## 2013-03-24 NOTE — Telephone Encounter (Signed)
Thank you!  AP 

## 2013-03-25 NOTE — Telephone Encounter (Signed)
Either way: Dr Jamey Ripa and Dr Derrell Lolling are both great surgeons. Thx!

## 2013-03-25 NOTE — Telephone Encounter (Signed)
Which do you recommend? Please advise

## 2013-03-26 ENCOUNTER — Other Ambulatory Visit: Payer: Self-pay | Admitting: Internal Medicine

## 2013-03-26 NOTE — Telephone Encounter (Signed)
Pt informed

## 2013-03-27 ENCOUNTER — Ambulatory Visit (INDEPENDENT_AMBULATORY_CARE_PROVIDER_SITE_OTHER): Payer: Medicare Other | Admitting: Surgery

## 2013-03-27 ENCOUNTER — Encounter (INDEPENDENT_AMBULATORY_CARE_PROVIDER_SITE_OTHER): Payer: Self-pay | Admitting: Surgery

## 2013-03-27 VITALS — BP 124/70 | HR 70 | Temp 98.0°F | Resp 18 | Ht 72.0 in | Wt 160.0 lb

## 2013-03-27 DIAGNOSIS — K4090 Unilateral inguinal hernia, without obstruction or gangrene, not specified as recurrent: Secondary | ICD-10-CM

## 2013-03-27 NOTE — Progress Notes (Signed)
NAME: Ricky [name] DOB: 1923-01-11 MRN: [id number]                                                                                      DATE: 03/27/2013  PCP: Sonda Primes, MD Referring Provider: Tresa Garter, MD  IMPRESSION:  Right inguinal hernia, reducible Bilateral right and left lower quadrant abdominal laxity without obvious hernia Coumadin therapy for atrial fibrillation  PLAN:   I reviewed the situation at length with the patient. He would like to go ahead and schedule inguinal hernia repair. We discussed risks and complications. I told him that we will need to confirm with his medical physicians paraoperative management of his Coumadin therapy. I think he can be done safely as an outpatient.                 CC:  Chief Complaint  Patient presents with  . Mass    RLQ abd mass    HPI:  Ricky [name] is a 79 y.o.  male who presents for evaluation of a right inguinal bulge consistent with a hernia. He was recently seen by his primary care physician because of a bulge in the right lower abdomen and this has been present about 10 days. It seems to get smaller when he lies down. It is not symptomatic. His primary physician thought this represented a hernia and requested surgical evaluation. He's never had any hernias or other prior abdominal surgeries or problems. He has no urinary tract symptoms. He occasionally has some mild constipation but this is a life long problem with no recent changes.  PMH:  has a past medical history of HYPOTHYROIDISM (09/04/2007); ANXIETY (04/26/2008); Essential hypertension, benign (10/03/2007); LYMPHADENOPATHY (04/06/2009); TOBACCO USE, QUIT (07/14/2009); Atrial fibrillation (10/31/2010); and Pacemaker.  PSH:   has past surgical history that includes Pacemaker insertion (10/16/05) and Eye surgery (2000).  ALLERGIES:   Allergies  Allergen Reactions  . Amiodarone Hcl     Weakness in muscles   . Amoxicillin     Rash  . Atorvastatin    unknown  . Benazepril Hcl     unknown  . Clonidine Hydrochloride     unknown  . Colesevelam     unknown  . Diltiazem Hcl     unknown  . Ezetimibe     Unknown     MEDICATIONS: Current outpatient prescriptions:carvedilol (COREG) 25 MG tablet, Take 1 tablet (25 mg total) by mouth 2 (two) times daily., Disp: 180 tablet, Rfl: 3;  Cholecalciferol 1000 UNITS tablet, Take 1,000 Units by mouth daily.  , Disp: , Rfl: ;  EPINEPHrine (EPIPEN 2-PAK) 0.3 mg/0.3 mL DEVI, Inject 0.3 mg into the muscle once as needed. For allergic reaction, Disp: , Rfl:  irbesartan (AVAPRO) 300 MG tablet, Take 1 tablet (300 mg total) by mouth at bedtime., Disp: 90 tablet, Rfl: 3;  levothyroxine (SYNTHROID, LEVOTHROID) 75 MCG tablet, Take 75 mcg by mouth daily., Disp: , Rfl: ;  Multiple Vitamin (MULTIVITAMIN) tablet, Take 1 tablet by mouth daily.  , Disp: , Rfl: ;  nitroGLYCERIN (NITROSTAT) 0.4 MG SL tablet, Place 0.4 mg under the tongue every 5 (five) minutes as needed. For  chest pain, Disp: , Rfl:  warfarin (COUMADIN) 5 MG tablet, TAKE ONE TABLET BY MOUTH EVERY DAY. TAKE AS DIRECTED BY COUMADIN CLINIC., Disp: 90 tablet, Rfl: 0;  diphenhydrAMINE (BENADRYL) 25 mg capsule, Take 1 capsule (25 mg total) by mouth every 4 (four) hours as needed for itching (1-2 for bee stings)., Disp: 30 capsule, Rfl: 2;  torsemide (DEMADEX) 20 MG tablet, Take 1-2 tablets (20-40 mg total) by mouth daily., Disp: 180 tablet, Rfl: 3  ROS: He has filled out our 12 point review of systems and it is negative . EXAM:   VITAL SIGNS: BP 124/70  Pulse 70  Temp(Src) 98 F (36.7 C)  Resp 18  Ht 6' (1.829 m)  Wt 160 lb (72.576 kg)  BMI 21.7 kg/m2  GENERAL:  The patient is alert, oriented, and generally healthy-appearing, NAD. Mood and affect are normal.  HEENT:  The head is normocephalic, the eyes nonicteric, the pupils were round regular and equal. EOMs are normal. Pharynx normal. Dentition good.  NECK:  The neck is supple and there are no masses  or thyromegaly.  LUNGS:  Normal respirations and clear to auscultation.  HEART:  Regular rhythm, with no murmurs rubs or gallops. Pulses are intact carotid dorsalis pedis and posterior tibial. No significant varicosities are noted.  ABDOMEN:  Soft, flat, and nontender. No masses or organomegaly is noted. No hernias are noted. Bowel sounds are normal.when he stands up there appears to be some bulging in the right and left lower quadrant but I don't feel a hernia. He does have a right inguinal bulge is visible when he stands. There is also secondary bulge that is near the area of the deep ring. On inguinal exam he clearly has a right inguinal hernia. There is no evidence of a left inguinal hernia. The hernia reduces easily.  GU:  Normal male, uncircumcised  EXTREMITIES:  Good range of motion, no edema.   DATA REVIEWED:  I have reviewed the notes electronic medical record    Avanna Sowder J 03/27/2013  CC: Plotnikov, Georgina Quint, MD, Sonda Primes, MD

## 2013-03-27 NOTE — Patient Instructions (Signed)
We will check with your medical physicians about how to manage the Coumadin around the time of surgery. We will then schedule surgery to repair the right inguinal hernia

## 2013-03-30 ENCOUNTER — Other Ambulatory Visit: Payer: Self-pay | Admitting: Internal Medicine

## 2013-04-01 ENCOUNTER — Ambulatory Visit (INDEPENDENT_AMBULATORY_CARE_PROVIDER_SITE_OTHER): Payer: Medicare Other | Admitting: General Practice

## 2013-04-01 DIAGNOSIS — I4891 Unspecified atrial fibrillation: Secondary | ICD-10-CM

## 2013-04-01 DIAGNOSIS — Z7901 Long term (current) use of anticoagulants: Secondary | ICD-10-CM

## 2013-04-03 ENCOUNTER — Telehealth (INDEPENDENT_AMBULATORY_CARE_PROVIDER_SITE_OTHER): Payer: Self-pay | Admitting: *Deleted

## 2013-04-03 NOTE — Telephone Encounter (Signed)
Dr Graciela Husbands sent me an inbasket note that he can stop coumadin and is stable from cards standpoint for surgery Do I need to fill out posting sheet or put orders in?

## 2013-04-03 NOTE — Telephone Encounter (Signed)
Patient called to ask if we have received clearance for patient to be able to proceed with surgery.  Patient just wanting an update.

## 2013-04-06 NOTE — Telephone Encounter (Signed)
It does not appear that orders have been placed at this time so yes you would need to place orders.

## 2013-04-10 ENCOUNTER — Ambulatory Visit (INDEPENDENT_AMBULATORY_CARE_PROVIDER_SITE_OTHER): Payer: Medicare Other | Admitting: General Surgery

## 2013-04-15 ENCOUNTER — Telehealth (INDEPENDENT_AMBULATORY_CARE_PROVIDER_SITE_OTHER): Payer: Self-pay | Admitting: Surgery

## 2013-04-15 NOTE — Telephone Encounter (Signed)
Pt wants surgery scheduled no orders  States was told by Dr Jamey Ripa would receive a call never had

## 2013-04-17 ENCOUNTER — Other Ambulatory Visit (INDEPENDENT_AMBULATORY_CARE_PROVIDER_SITE_OTHER): Payer: Self-pay | Admitting: Surgery

## 2013-04-27 ENCOUNTER — Ambulatory Visit (INDEPENDENT_AMBULATORY_CARE_PROVIDER_SITE_OTHER): Payer: Medicare Other | Admitting: *Deleted

## 2013-04-27 ENCOUNTER — Telehealth: Payer: Self-pay | Admitting: General Practice

## 2013-04-27 DIAGNOSIS — I4891 Unspecified atrial fibrillation: Secondary | ICD-10-CM

## 2013-04-27 LAB — PACEMAKER DEVICE OBSERVATION
AL IMPEDENCE PM: 391 Ohm
BRDY-0002RV: 65 {beats}/min
BRDY-0004RV: 125 {beats}/min
DEVICE MODEL PM: 1647680
RV LEAD THRESHOLD: 0.5 V

## 2013-04-27 NOTE — Telephone Encounter (Signed)
Message copied by Garrison Columbus on Mon Apr 27, 2013  9:04 AM ------      Message from: Tresa Garter      Created: Fri Apr 24, 2013  5:08 PM       Yes, please.      Thx      AP      ----- Message -----         From: Garrison Columbus, RN         Sent: 04/24/2013   2:09 PM           To: Tresa Garter, MD            Patient to have hernia surgery on 8/20.  Lovenox bridge??            Cindy       ------

## 2013-04-27 NOTE — Progress Notes (Signed)
PPM check in office. 

## 2013-04-28 ENCOUNTER — Telehealth: Payer: Self-pay | Admitting: General Practice

## 2013-04-28 NOTE — Telephone Encounter (Signed)
Instructed patient to take last dose of coumadin on 8/15 until after surgery on the 8/20. 8/21 - Take 7.5 mg of coumadin 8/22 - Take 7.5 mg of coumadin 8/23 - Take 5 mg of coumadin 8/24 - Take 5 mg of coumadin 8/25 - Take 5 mg of coumadin 8/26 - Take 5 mg of coumadin 8/27 - Re-check in clinic.

## 2013-04-28 NOTE — Telephone Encounter (Signed)
Message copied by Garrison Columbus on Tue Apr 28, 2013  8:22 AM ------      Message from: Duke Salvia      Created: Mon Apr 27, 2013  6:22 PM       No need to bridge      steve      ----- Message -----         From: Garrison Columbus, RN         Sent: 04/01/2013  11:06 AM           To: Duke Salvia, MD            Patient will be having hernia surgery in the next 30 days or so.  Do you want pt to have Lovenox bridge?  Thanks!            Bailey Mech, RN      [facility] - Coumadin [facility]       ------

## 2013-04-30 ENCOUNTER — Encounter (HOSPITAL_BASED_OUTPATIENT_CLINIC_OR_DEPARTMENT_OTHER): Payer: Self-pay | Admitting: *Deleted

## 2013-04-30 NOTE — Progress Notes (Signed)
Pt very sharp for age-he was able to remember all phone numbers for children-drives0to come by 8/19 for pt bmet-dr klein has given clearance-will get pacer form filled out-

## 2013-04-30 NOTE — Progress Notes (Signed)
Reviewed with Dr Sampson Goon -ok-pacer rep does not need to be here per Dr Graciela Husbands

## 2013-05-05 ENCOUNTER — Encounter (HOSPITAL_BASED_OUTPATIENT_CLINIC_OR_DEPARTMENT_OTHER)
Admission: RE | Admit: 2013-05-05 | Discharge: 2013-05-05 | Disposition: A | Discharge status: Home / Self Care | Payer: Medicare Other | Source: Ambulatory Visit | Attending: Surgery | Admitting: Surgery

## 2013-05-05 ENCOUNTER — Telehealth (INDEPENDENT_AMBULATORY_CARE_PROVIDER_SITE_OTHER): Payer: Self-pay

## 2013-05-05 LAB — PROTIME-INR: Prothrombin Time: 16 seconds — ABNORMAL HIGH (ref 11.6–15.2)

## 2013-05-05 LAB — BASIC METABOLIC PANEL
CO2: 32 mEq/L (ref 19–32)
Calcium: 9.2 mg/dL (ref 8.4–10.5)
Creatinine, Ser: 0.82 mg/dL (ref 0.50–1.35)

## 2013-05-05 NOTE — Telephone Encounter (Signed)
Ricky [name] at [facility] wants to know if cxr can be used from Feb 2014. Pts orders include cxr. I advised her I will send request to Dr Jamey Ripa and his assistant. Arline Asp can be reached at [phone].

## 2013-05-05 NOTE — Telephone Encounter (Signed)
Cindy at [facility] advised ok to use feb cxr if ok with anesthesia.

## 2013-05-05 NOTE — Telephone Encounter (Signed)
The prior xray is OK with me if OK with anesthesia

## 2013-05-06 ENCOUNTER — Encounter (HOSPITAL_BASED_OUTPATIENT_CLINIC_OR_DEPARTMENT_OTHER): Payer: Self-pay | Admitting: *Deleted

## 2013-05-06 ENCOUNTER — Ambulatory Visit (HOSPITAL_BASED_OUTPATIENT_CLINIC_OR_DEPARTMENT_OTHER): Payer: Medicare Other | Admitting: Anesthesiology

## 2013-05-06 ENCOUNTER — Encounter (HOSPITAL_BASED_OUTPATIENT_CLINIC_OR_DEPARTMENT_OTHER)
Admission: RE | Disposition: A | Discharge status: Home / Self Care | Payer: Self-pay | Source: Ambulatory Visit | Attending: Surgery

## 2013-05-06 ENCOUNTER — Ambulatory Visit (HOSPITAL_BASED_OUTPATIENT_CLINIC_OR_DEPARTMENT_OTHER)
Admission: RE | Admit: 2013-05-06 | Discharge: 2013-05-06 | Disposition: A | Discharge status: Home / Self Care | Payer: Medicare Other | Source: Ambulatory Visit | Attending: Surgery | Admitting: Surgery

## 2013-05-06 ENCOUNTER — Encounter (HOSPITAL_BASED_OUTPATIENT_CLINIC_OR_DEPARTMENT_OTHER): Payer: Self-pay | Admitting: Anesthesiology

## 2013-05-06 DIAGNOSIS — Z7901 Long term (current) use of anticoagulants: Secondary | ICD-10-CM | POA: Insufficient documentation

## 2013-05-06 DIAGNOSIS — Z79899 Other long term (current) drug therapy: Secondary | ICD-10-CM | POA: Insufficient documentation

## 2013-05-06 DIAGNOSIS — I4891 Unspecified atrial fibrillation: Secondary | ICD-10-CM | POA: Insufficient documentation

## 2013-05-06 DIAGNOSIS — E039 Hypothyroidism, unspecified: Secondary | ICD-10-CM | POA: Insufficient documentation

## 2013-05-06 DIAGNOSIS — K4090 Unilateral inguinal hernia, without obstruction or gangrene, not specified as recurrent: Secondary | ICD-10-CM

## 2013-05-06 DIAGNOSIS — Z87891 Personal history of nicotine dependence: Secondary | ICD-10-CM | POA: Insufficient documentation

## 2013-05-06 DIAGNOSIS — Z880 Allergy status to penicillin: Secondary | ICD-10-CM | POA: Insufficient documentation

## 2013-05-06 DIAGNOSIS — I10 Essential (primary) hypertension: Secondary | ICD-10-CM | POA: Insufficient documentation

## 2013-05-06 DIAGNOSIS — Z950 Presence of cardiac pacemaker: Secondary | ICD-10-CM | POA: Insufficient documentation

## 2013-05-06 DIAGNOSIS — F411 Generalized anxiety disorder: Secondary | ICD-10-CM | POA: Insufficient documentation

## 2013-05-06 DIAGNOSIS — Z888 Allergy status to other drugs, medicaments and biological substances status: Secondary | ICD-10-CM | POA: Insufficient documentation

## 2013-05-06 HISTORY — DX: Benign prostatic hyperplasia without lower urinary tract symptoms: N40.0

## 2013-05-06 HISTORY — PX: INGUINAL HERNIA REPAIR: SHX194

## 2013-05-06 HISTORY — DX: Nocturia: R35.1

## 2013-05-06 LAB — GLUCOSE, CAPILLARY: Glucose-Capillary: 107 mg/dL — ABNORMAL HIGH (ref 70–99)

## 2013-05-06 SURGERY — REPAIR, HERNIA, INGUINAL, ADULT
Anesthesia: General | Site: Abdomen | Laterality: Right | Wound class: Clean

## 2013-05-06 MED ORDER — OXYCODONE HCL 5 MG PO TABS: 5.0000 mg | ORAL_TABLET | Freq: Once | ORAL | Status: DC | PRN

## 2013-05-06 MED ORDER — LIDOCAINE HCL (CARDIAC) 20 MG/ML IV SOLN
INTRAVENOUS | Status: DC | PRN
  Administered 2013-05-06: 50 mg via INTRAVENOUS

## 2013-05-06 MED ORDER — HYDROMORPHONE HCL PF 1 MG/ML IJ SOLN
0.2500 mg | INTRAMUSCULAR | Status: DC | PRN
  Administered 2013-05-06 (×2): 0.5 mg via INTRAVENOUS

## 2013-05-06 MED ORDER — MIDAZOLAM HCL 2 MG/2ML IJ SOLN
1.0000 mg | INTRAMUSCULAR | Status: DC | PRN
  Administered 2013-05-06 (×2): 1 mg via INTRAVENOUS

## 2013-05-06 MED ORDER — FENTANYL CITRATE 0.05 MG/ML IJ SOLN
50.0000 ug | INTRAMUSCULAR | Status: DC | PRN
  Administered 2013-05-06: 100 ug via INTRAVENOUS

## 2013-05-06 MED ORDER — LACTATED RINGERS IV SOLN
INTRAVENOUS | Status: DC
  Administered 2013-05-06: 09:00:00 via INTRAVENOUS
  Administered 2013-05-06: 10 mL/h via INTRAVENOUS

## 2013-05-06 MED ORDER — CHLORHEXIDINE GLUCONATE 4 % EX LIQD: 1.0000 "application " | Freq: Once | CUTANEOUS | Status: DC

## 2013-05-06 MED ORDER — MEPERIDINE HCL 25 MG/ML IJ SOLN: 6.2500 mg | INTRAMUSCULAR | Status: DC | PRN

## 2013-05-06 MED ORDER — ONDANSETRON HCL 4 MG/2ML IJ SOLN
INTRAMUSCULAR | Status: DC | PRN
  Administered 2013-05-06: 4 mg via INTRAVENOUS

## 2013-05-06 MED ORDER — BUPIVACAINE HCL (PF) 0.25 % IJ SOLN
INTRAMUSCULAR | Status: DC | PRN
  Administered 2013-05-06: 16 mL

## 2013-05-06 MED ORDER — OXYCODONE HCL 5 MG/5ML PO SOLN: 5.0000 mg | Freq: Once | ORAL | Status: DC | PRN

## 2013-05-06 MED ORDER — ONDANSETRON HCL 4 MG/2ML IJ SOLN: 4.0000 mg | Freq: Once | INTRAMUSCULAR | Status: DC | PRN

## 2013-05-06 MED ORDER — CIPROFLOXACIN IN D5W 400 MG/200ML IV SOLN
400.0000 mg | INTRAVENOUS | Status: AC
  Administered 2013-05-06: 400 mg via INTRAVENOUS

## 2013-05-06 MED ORDER — BUPIVACAINE-EPINEPHRINE PF 0.5-1:200000 % IJ SOLN
INTRAMUSCULAR | Status: DC | PRN
  Administered 2013-05-06: 30 mL

## 2013-05-06 MED ORDER — HYDROCODONE-ACETAMINOPHEN 5-325 MG PO TABS: 1.0000 | ORAL_TABLET | ORAL | Status: DC | PRN

## 2013-05-06 MED ORDER — PROPOFOL 10 MG/ML IV BOLUS
INTRAVENOUS | Status: DC | PRN
  Administered 2013-05-06: 140 mg via INTRAVENOUS

## 2013-05-06 SURGICAL SUPPLY — 43 items
ADH SKN CLS APL DERMABOND .7 (GAUZE/BANDAGES/DRESSINGS) ×1
BLADE HEX COATED 2.75 (ELECTRODE) ×2 IMPLANT
BLADE SURG 10 STRL SS (BLADE) ×2 IMPLANT
BLADE SURG ROTATE 9660 (MISCELLANEOUS) ×1 IMPLANT
CHLORAPREP W/TINT 26ML (MISCELLANEOUS) ×2 IMPLANT
CLIP TI WIDE RED SMALL 6 (CLIP) IMPLANT
CLOTH BEACON ORANGE TIMEOUT ST (SAFETY) ×2 IMPLANT
COVER MAYO STAND STRL (DRAPES) ×2 IMPLANT
COVER TABLE BACK 60X90 (DRAPES) ×2 IMPLANT
DECANTER SPIKE VIAL GLASS SM (MISCELLANEOUS) IMPLANT
DERMABOND ADVANCED (GAUZE/BANDAGES/DRESSINGS) ×1
DERMABOND ADVANCED .7 DNX12 (GAUZE/BANDAGES/DRESSINGS) ×1 IMPLANT
DRAIN PENROSE 1/2X12 LTX STRL (WOUND CARE) ×2 IMPLANT
DRAPE LAPAROTOMY TRNSV 102X78 (DRAPE) ×2 IMPLANT
DRAPE UTILITY XL STRL (DRAPES) ×2 IMPLANT
ELECT REM PT RETURN 9FT ADLT (ELECTROSURGICAL) ×2
ELECTRODE REM PT RTRN 9FT ADLT (ELECTROSURGICAL) ×1 IMPLANT
GLOVE BIOGEL PI IND STRL 7.0 (GLOVE) IMPLANT
GLOVE BIOGEL PI INDICATOR 7.0 (GLOVE) ×1
GLOVE ECLIPSE 6.5 STRL STRAW (GLOVE) ×1 IMPLANT
GLOVE EUDERMIC 7 POWDERFREE (GLOVE) ×2 IMPLANT
GLOVE EXAM NITRILE MD LF STRL (GLOVE) ×1 IMPLANT
GOWN PREVENTION PLUS XLARGE (GOWN DISPOSABLE) ×4 IMPLANT
MESH HERNIA 3X6 (Mesh General) ×1 IMPLANT
NDL HYPO 25X1 1.5 SAFETY (NEEDLE) ×1 IMPLANT
NEEDLE HYPO 22GX1.5 SAFETY (NEEDLE) ×2 IMPLANT
NEEDLE HYPO 25X1 1.5 SAFETY (NEEDLE) ×2 IMPLANT
NS IRRIG 1000ML POUR BTL (IV SOLUTION) IMPLANT
PACK BASIN DAY SURGERY FS (CUSTOM PROCEDURE TRAY) ×2 IMPLANT
PENCIL BUTTON HOLSTER BLD 10FT (ELECTRODE) ×2 IMPLANT
SLEEVE SCD COMPRESS KNEE MED (MISCELLANEOUS) ×1 IMPLANT
SPONGE INTESTINAL PEANUT (DISPOSABLE) ×2 IMPLANT
SPONGE LAP 4X18 X RAY DECT (DISPOSABLE) ×2 IMPLANT
SUT MNCRL AB 4-0 PS2 18 (SUTURE) ×2 IMPLANT
SUT PROLENE 2 0 CT2 30 (SUTURE) ×4 IMPLANT
SUT SILK 2 0 SH (SUTURE) IMPLANT
SUT VIC AB 3-0 CT1 27 (SUTURE) ×4
SUT VIC AB 3-0 CT1 27XBRD (SUTURE) ×2 IMPLANT
SUT VIC AB 4-0 BRD 54 (SUTURE) ×2 IMPLANT
SYR CONTROL 10ML LL (SYRINGE) ×2 IMPLANT
TAPE HYPAFIX 4 X10 (GAUZE/BANDAGES/DRESSINGS) IMPLANT
TOWEL OR 17X24 6PK STRL BLUE (TOWEL DISPOSABLE) ×2 IMPLANT
TOWEL OR NON WOVEN STRL DISP B (DISPOSABLE) ×2 IMPLANT

## 2013-05-06 NOTE — Op Note (Signed)
Ricky [name] [birth year] [id number] 04/17/2013  Preoperative diagnosis: right inguinal hernia, probably indirect  Postoperative diagnosis: indirect right inguinal hernia  Procedure: repair of indirect right inguinal hernia with mesh  Surgeon: Currie Paris, MD, FACS  Anesthesia:GA combined with regional for post-op pain  Clinical History and Indications: this patient has presented with a reducible visible right inguinal hernia. He wished to have this repaired.  Description of Procedure:The patient was seen in the holding area and the plans for the procedure as noted above confirmed with the patient. We reviewed again the risks and complications and the patient had no further questions. I then marked the right  as the operative side. This was confirmed with the patient. He wishes to proceed.  The patient was taken to the operating room and after satisfactory Gen. anesthesia was obtained the right  inguinal area was prepped and draped as a sterile field. A time out was done.  I used 0.25% plain Marcaine local anesthesia and to help with postoperative pain management. The area of the incision was infiltrated first as well as a field block going medially and inferiorly. An oblique incision was made and deepened to the external oblique aponeurosis. Bleeders were either cauterized or tied with 3-0 Vicryl. The external oblique aponeurosis was opened in the line of its fibers and elevated off of the underlying tissue. The ilioinguinal nerve was noted and protected.  The spermatic cord was then dissected up off of the inguinal floor and surrounded with a drain. An indirect sac was identified. I think it was chronic as it was fairly stuck to the adjacent cord structures. I was able to dissected off down to the neck. I then twisted it and suture ligated and amputated the excess sac. It retracted under the deep ring.  I then took some Bard mesh and cut it to shape. It was anchored at the pubic  tubercle and a running 2-0 Prolene used to suture it to the edge of the inferior shelving edge of the external oblique. It was split laterally to go around the cord and then laid gently over the internal oblique medially. Several more sutures of 2-0 Prolene were used to anchor the mesh to the internal oblique. The tails of the mesh were crossed lateral to the cord structures and deep ring and tacked together.  This appeared to produce a nice coverage and repair with no tension. There was adequate space for the cord structures to exit through the mesh and deep ring.  I checked to make sure everything was dry. Additional local had been infiltrated as I was working to be sure we had complete anesthesia of the entire operative field.  The incision was then closed with a running 3-0 Vicryl on the external oblique, closing it over the repair. Scarpa's fascia was closed with a running 3-0 Vicryl and the skin with a running 4-0 Monocryl subcuticular and Dermabond on the skin.  The patient tolerated the procedure well. There were no operative complications. There was minimal blood loss. All counts were correct.  Currie Paris, MD, FACS 05/06/2013 10:33 AM

## 2013-05-06 NOTE — Transfer of Care (Signed)
Immediate Anesthesia Transfer of Care Note  Patient: Ricky [name]  Procedure(s) Performed: Procedure(s): HERNIA REPAIR INGUINAL ADULT (Right)  Patient Location: PACU  Anesthesia Type:General  Level of Consciousness: sedated  Airway & Oxygen Therapy: Patient Spontanous Breathing and Patient connected to face mask oxygen  Post-op Assessment: Report given to PACU RN and Post -op Vital signs reviewed and stable  Post vital signs: Reviewed and stable  Complications: No apparent anesthesia complications

## 2013-05-06 NOTE — H&P (Signed)
NAME: Ricky [name]  DOB: [birth year]  MRN: [id number]  DATE: 03/27/2013  PCP: Sonda Primes, MD  Referring Provider: Tresa Garter, MD  IMPRESSION:  Right inguinal hernia, reducible  Bilateral right and left lower quadrant abdominal laxity without obvious hernia  Coumadin therapy for atrial fibrillation, now stopped for surgery PLAN:  RIH repair. I have reviewed again with patient and he has no other questions and I marked the Right inguinal area as the operative site. CC:  Chief Complaint   Patient presents with   .  Mass     RLQ abd mass   HPI: Ricky [name] is a 79 y.o. male who presents for surgery for a RIH PMH: has a past medical history of HYPOTHYROIDISM (09/04/2007); ANXIETY (04/26/2008); Essential hypertension, benign (10/03/2007); LYMPHADENOPATHY (04/06/2009); TOBACCO USE, QUIT (07/14/2009); Atrial fibrillation (10/31/2010); and Pacemaker.  PSH: has past surgical history that includes Pacemaker insertion (10/16/05) and Eye surgery (2000).  ALLERGIES:  Allergies   Allergen  Reactions   .  Amiodarone Hcl      Weakness in muscles   .  Amoxicillin      Rash   .  Atorvastatin      unknown   .  Benazepril Hcl      unknown   .  Clonidine Hydrochloride      unknown   .  Colesevelam      unknown   .  Diltiazem Hcl      unknown   .  Ezetimibe      Unknown   MEDICATIONS:  Current Facility-Administered Medications  Medication Dose Route Frequency Provider Last Rate Last Dose  . chlorhexidine (HIBICLENS) 4 % liquid 1 application  1 application Topical Once Currie Paris, MD      . Melene Muller ON 05/07/2013] chlorhexidine (HIBICLENS) 4 % liquid 1 application  1 application Topical Once Currie Paris, MD      . ciprofloxacin (CIPRO) IVPB 400 mg  400 mg Intravenous On Call to OR Currie Paris, MD   400 mg at 05/06/13 0907  . fentaNYL (SUBLIMAZE) injection 50-100 mcg  50-100 mcg Intravenous PRN Rosezella Florida, MD   100 mcg at 05/06/13 0846  . lactated  ringers infusion   Intravenous Continuous Rosezella Florida, MD 10 mL/hr at 05/06/13 [phone]    . midazolam (VERSED) injection 1-2 mg  1-2 mg Intravenous PRN Rosezella Florida, MD   1 mg at 05/06/13 1191   Facility-Administered Medications Ordered in Other Encounters  Medication Dose Route Frequency Provider Last Rate Last Dose  . bupivacaine-EPINEPHrine (PF) (MARCAINE W/ EPI) 0.5-1:200000 % injection    Anesthesia Intra-op Aubery Lapping, MD   30 mL at 05/06/13 0840      .  EXAM:  VITAL SIGNS:  BP 124/70  Pulse 70  Temp(Src) 98 F (36.7 C)  Resp 18  Ht 6' (1.829 m)  Wt 160 lb (72.576 kg)  BMI 21.7 kg/m2  GENERAL:  The patient is alert, oriented, and generally healthy-appearing, NAD. Mood and affect are normal.  HEENT:  The head is normocephalic, the eyes nonicteric, the pupils were round regular and equal. EOMs are normal. Pharynx normal. Dentition good.  NECK:  The neck is supple and there are no masses or thyromegaly.  LUNGS:  Normal respirations and clear to auscultation.  HEART:  Regular rhythm, with no murmurs rubs or gallops. Pulses are intact carotid dorsalis pedis and posterior tibial. No significant varicosities are noted.  ABDOMEN:  Soft, flat,  and nontender. No masses or organomegaly is noted. No hernias are noted. Bowel sounds are normal.when he stands up there appears to be some bulging in the right and left lower quadrant but I don't feel a hernia. He does have a right inguinal bulge is visible when he stands. There is also secondary bulge that is near the area of the deep ring. On inguinal exam he clearly has a right inguinal hernia. There is no evidence of a left inguinal hernia. The hernia reduces easily.  GU:  Normal male, uncircumcised  EXTREMITIES:  Good range of motion, no edema.

## 2013-05-06 NOTE — Anesthesia Preprocedure Evaluation (Signed)
Anesthesia Evaluation  Patient identified by MRN, date of birth, ID band Patient awake    Reviewed: Allergy & Precautions, H&P , NPO status , Patient's Chart, lab work & pertinent test results  Airway Mallampati: I TM Distance: >3 FB Neck ROM: Full    Dental   Pulmonary          Cardiovascular hypertension, Pt. on medications +CHF + pacemaker     Neuro/Psych    GI/Hepatic   Endo/Other    Renal/GU      Musculoskeletal   Abdominal   Peds  Hematology   Anesthesia Other Findings   Reproductive/Obstetrics                           Anesthesia Physical Anesthesia Plan  ASA: III  Anesthesia Plan: General   Post-op Pain Management:    Induction: Intravenous  Airway Management Planned: LMA  Additional Equipment:   Intra-op Plan:   Post-operative Plan: Extubation in OR  Informed Consent: I have reviewed the patients History and Physical, chart, labs and discussed the procedure including the risks, benefits and alternatives for the proposed anesthesia with the patient or authorized representative who has indicated his/her understanding and acceptance.     Plan Discussed with: CRNA and Surgeon  Anesthesia Plan Comments:         Anesthesia Quick Evaluation

## 2013-05-06 NOTE — Progress Notes (Signed)
AssistedDr. Ossey with right, ultrasound guided, transabdominal plane block. Side rails up, monitors on throughout procedure. See vital signs in flow sheet. Tolerated Procedure well.  

## 2013-05-06 NOTE — Anesthesia Postprocedure Evaluation (Signed)
Anesthesia Post Note  Patient: Ricky [name]  Procedure(s) Performed: Procedure(s) (LRB): HERNIA REPAIR INGUINAL ADULT (Right)  Anesthesia type: general  Patient location: PACU  Post pain: Pain level controlled  Post assessment: Patient's Cardiovascular Status Stable  Last Vitals:  Filed Vitals:   05/06/13 1245  BP: 186/85  Pulse: 65  Temp: 36.3 C  Resp: 20    Post vital signs: Reviewed and stable  Level of consciousness: sedated  Complications: No apparent anesthesia complications

## 2013-05-06 NOTE — Anesthesia Procedure Notes (Addendum)
Anesthesia Regional Block:  TAP block  Pre-Anesthetic Checklist: ,, timeout performed, Correct Patient, Correct Site, Correct Laterality, Correct Procedure, Correct Position, site marked, Risks and benefits discussed,  Surgical consent,  Pre-op evaluation,  At surgeon's request and post-op pain management  Laterality: Right  Prep: chloraprep and alcohol swabs       Needles:  Injection technique: Single-shot  Needle Type: Echogenic Stimulator Needle          Additional Needles:  Procedures: ultrasound guided (picture in chart) TAP block Narrative:  Start time: 05/06/2013 8:45 AM End time: 05/06/2013 8:58 AM Injection made incrementally with aspirations every 5 mL.  Performed by: Personally  Anesthesiologist: Arta Bruce MD  Additional Notes: Monitors applied. Patient sedated. Sterile prep and drape,hand hygiene and sterile gloves were used. Relevant anatomy identified.Needle position confirmed.Local anesthetic injected incrementally after negative aspiration. Local anesthetic spread visualized in Transversus Abdominus Plane. Vascular puncture avoided. No complications. Image printed for medical record.The patient tolerated the procedure well.       TAP block Procedure Name: LMA Insertion Date/Time: 05/06/2013 9:42 AM Performed by: Caren Macadam Pre-anesthesia Checklist: Patient identified, Emergency Drugs available, Suction available and Patient being monitored Patient Re-evaluated:Patient Re-evaluated prior to inductionOxygen Delivery Method: Circle System Utilized Preoxygenation: Pre-oxygenation with 100% oxygen Intubation Type: IV induction Ventilation: Mask ventilation without difficulty LMA: LMA inserted LMA Size: 5.0 Number of attempts: 1 Airway Equipment and Method: bite block Placement Confirmation: positive ETCO2 and breath sounds checked- equal and bilateral Tube secured with: Tape Dental Injury: Teeth and Oropharynx as per pre-operative assessment

## 2013-05-07 ENCOUNTER — Encounter (HOSPITAL_BASED_OUTPATIENT_CLINIC_OR_DEPARTMENT_OTHER): Payer: Self-pay | Admitting: Surgery

## 2013-05-08 ENCOUNTER — Telehealth (INDEPENDENT_AMBULATORY_CARE_PROVIDER_SITE_OTHER): Payer: Self-pay

## 2013-05-08 NOTE — Telephone Encounter (Signed)
Spoke with pt letting him know that I have scheduled him to come into the office to see Dr. Jamey Ripa on 9/23 @ 210pm.

## 2013-05-13 ENCOUNTER — Ambulatory Visit (INDEPENDENT_AMBULATORY_CARE_PROVIDER_SITE_OTHER): Payer: Medicare Other | Admitting: General Practice

## 2013-05-13 DIAGNOSIS — I4891 Unspecified atrial fibrillation: Secondary | ICD-10-CM

## 2013-05-13 DIAGNOSIS — Z7901 Long term (current) use of anticoagulants: Secondary | ICD-10-CM

## 2013-05-13 LAB — POCT INR: INR: 2.1

## 2013-05-28 ENCOUNTER — Ambulatory Visit (INDEPENDENT_AMBULATORY_CARE_PROVIDER_SITE_OTHER): Payer: Medicare Other | Admitting: Internal Medicine

## 2013-05-28 ENCOUNTER — Encounter: Payer: Self-pay | Admitting: Internal Medicine

## 2013-05-28 VITALS — BP 130/78 | HR 68 | Temp 97.1°F | Resp 16 | Wt 163.0 lb

## 2013-05-28 DIAGNOSIS — Z23 Encounter for immunization: Secondary | ICD-10-CM

## 2013-05-28 DIAGNOSIS — S40029A Contusion of unspecified upper arm, initial encounter: Secondary | ICD-10-CM

## 2013-05-28 DIAGNOSIS — S40022A Contusion of left upper arm, initial encounter: Secondary | ICD-10-CM

## 2013-05-28 DIAGNOSIS — S7000XA Contusion of unspecified hip, initial encounter: Secondary | ICD-10-CM

## 2013-05-28 DIAGNOSIS — S60219A Contusion of unspecified wrist, initial encounter: Secondary | ICD-10-CM | POA: Insufficient documentation

## 2013-05-28 DIAGNOSIS — T148XXA Other injury of unspecified body region, initial encounter: Secondary | ICD-10-CM | POA: Insufficient documentation

## 2013-05-28 DIAGNOSIS — S7002XA Contusion of left hip, initial encounter: Secondary | ICD-10-CM

## 2013-05-28 NOTE — Assessment & Plan Note (Signed)
9/14 L arm Heat Vicodin prn

## 2013-05-28 NOTE — Assessment & Plan Note (Signed)
9/14 L  Vicodin prn Gentle heat

## 2013-05-28 NOTE — Patient Instructions (Addendum)
ARNICA ointment on bruises; gentle heat

## 2013-05-28 NOTE — Progress Notes (Signed)
   Subjective:     HPI  F/u hernia repair R  C/o a fall on sat night - fell in his bedroom- tripped. C/o L elbow bruise and L him  The patient presents for a follow-up of  chronic A fib, hypertension, chronic dyslipidemia, allergy to wasp stings   F/u blepharospasm BP is elevated at times, nl at home No CHF   BP Readings from Last 3 Encounters:  05/28/13 130/78  05/06/13 186/85  05/06/13 186/85   Wt Readings from Last 3 Encounters:  05/28/13 163 lb (73.936 kg)  05/06/13 160 lb 6 oz (72.746 kg)  05/06/13 160 lb 6 oz (72.746 kg)      Review of Systems  Constitutional: Negative for appetite change, fatigue and unexpected weight change.  HENT: Negative for nosebleeds, congestion, sneezing and trouble swallowing.   Eyes: Negative for itching and visual disturbance.  Respiratory: Positive for chest tightness.   Cardiovascular: Negative for palpitations.  Gastrointestinal: Negative for nausea, diarrhea, blood in stool and abdominal distention.  Genitourinary: Negative for frequency and hematuria.  Musculoskeletal: Negative for back pain, joint swelling and gait problem.  Neurological: Negative for dizziness, tremors, speech difficulty and weakness.  Psychiatric/Behavioral: Negative for sleep disturbance, dysphoric mood and agitation. The patient is not nervous/anxious.        Objective:   Physical Exam  Constitutional: He is oriented to person, place, and time. He appears well-developed. He appears distressed.  Mild resp distress  HENT:  Mouth/Throat: Oropharynx is clear and moist.  Eyes: Conjunctivae are normal. Pupils are equal, round, and reactive to light.  Neck: Normal range of motion. No JVD present. No thyromegaly present.  Cardiovascular: Intact distal pulses.  Exam reveals no gallop and no friction rub.   Murmur heard. Pulmonary/Chest: Effort normal. No respiratory distress. He has no wheezes. He has rales (B). He exhibits no tenderness.  Abdominal: Soft. Bowel  sounds are normal. He exhibits no distension and no mass. There is no tenderness. There is no rebound and no guarding.  Musculoskeletal: Normal range of motion. He exhibits edema (2+ B). He exhibits no tenderness.  Lymphadenopathy:    He has no cervical adenopathy.  Neurological: He is alert and oriented to person, place, and time. He has normal reflexes. No cranial nerve deficit. He exhibits normal muscle tone. Coordination normal.  Skin: Skin is warm and dry. No rash noted.  Psychiatric: He has a normal mood and affect. His behavior is normal. Judgment and thought content normal.   R groin/RLQ hernia repaired L elbow and L hip large bruise/hematoma   Lab Results  Component Value Date   WBC 7.4 10/22/2012   HGB 11.4* 05/06/2013   HCT 41.6 10/22/2012   PLT 220 10/22/2012   GLUCOSE 51* 05/05/2013   CHOL 164 11/08/2011   TRIG 47.0 11/08/2011   HDL 68.10 11/08/2011   LDLCALC 87 11/08/2011   ALT 21 10/22/2012   AST 31 10/22/2012   NA 136 05/05/2013   K 4.9 05/05/2013   CL 99 05/05/2013   CREATININE 0.82 05/05/2013   BUN 14 05/05/2013   CO2 32 05/05/2013   TSH 0.71 11/26/2012   PSA 0.81 02/20/2012   INR 2.1 05/13/2013           Assessment & Plan:

## 2013-06-01 ENCOUNTER — Encounter: Payer: Self-pay | Admitting: Internal Medicine

## 2013-06-04 ENCOUNTER — Telehealth: Payer: Self-pay | Admitting: *Deleted

## 2013-06-04 DIAGNOSIS — M25552 Pain in left hip: Secondary | ICD-10-CM

## 2013-06-04 NOTE — Telephone Encounter (Signed)
Pt called states he is still experiencing left hip pain.  Pt is requesting a CT or U/S to evaluate it.  Please advise

## 2013-06-04 NOTE — Telephone Encounter (Signed)
Spoke with pt, scheduled 9.19.14 @ 2:45pm

## 2013-06-04 NOTE — Telephone Encounter (Signed)
OK hip x ray OV any MD today or tomorrow Thx

## 2013-06-05 ENCOUNTER — Other Ambulatory Visit (INDEPENDENT_AMBULATORY_CARE_PROVIDER_SITE_OTHER): Payer: Medicare Other

## 2013-06-05 ENCOUNTER — Encounter: Payer: Self-pay | Admitting: Internal Medicine

## 2013-06-05 ENCOUNTER — Ambulatory Visit (INDEPENDENT_AMBULATORY_CARE_PROVIDER_SITE_OTHER): Payer: Medicare Other | Admitting: Internal Medicine

## 2013-06-05 ENCOUNTER — Ambulatory Visit (INDEPENDENT_AMBULATORY_CARE_PROVIDER_SITE_OTHER)
Admission: RE | Admit: 2013-06-05 | Discharge: 2013-06-05 | Disposition: A | Discharge status: Home / Self Care | Payer: Medicare Other | Source: Ambulatory Visit | Attending: Internal Medicine | Admitting: Internal Medicine

## 2013-06-05 VITALS — BP 130/80 | HR 65 | Temp 97.4°F | Ht 72.0 in | Wt 162.0 lb

## 2013-06-05 DIAGNOSIS — I5022 Chronic systolic (congestive) heart failure: Secondary | ICD-10-CM

## 2013-06-05 DIAGNOSIS — I10 Essential (primary) hypertension: Secondary | ICD-10-CM

## 2013-06-05 DIAGNOSIS — G245 Blepharospasm: Secondary | ICD-10-CM

## 2013-06-05 DIAGNOSIS — I4891 Unspecified atrial fibrillation: Secondary | ICD-10-CM

## 2013-06-05 DIAGNOSIS — I509 Heart failure, unspecified: Secondary | ICD-10-CM

## 2013-06-05 DIAGNOSIS — S7002XS Contusion of left hip, sequela: Secondary | ICD-10-CM

## 2013-06-05 DIAGNOSIS — M25559 Pain in unspecified hip: Secondary | ICD-10-CM

## 2013-06-05 DIAGNOSIS — Z7901 Long term (current) use of anticoagulants: Secondary | ICD-10-CM

## 2013-06-05 DIAGNOSIS — M25552 Pain in left hip: Secondary | ICD-10-CM

## 2013-06-05 DIAGNOSIS — IMO0002 Reserved for concepts with insufficient information to code with codable children: Secondary | ICD-10-CM

## 2013-06-05 DIAGNOSIS — E039 Hypothyroidism, unspecified: Secondary | ICD-10-CM

## 2013-06-05 LAB — CBC WITH DIFFERENTIAL/PLATELET
Basophils Absolute: 0 10*3/uL (ref 0.0–0.1)
Eosinophils Absolute: 0.1 10*3/uL (ref 0.0–0.7)
HCT: 35.5 % — ABNORMAL LOW (ref 39.0–52.0)
Lymphs Abs: 1.9 10*3/uL (ref 0.7–4.0)
MCV: 99.6 fl (ref 78.0–100.0)
Monocytes Absolute: 0.7 10*3/uL (ref 0.1–1.0)
Neutrophils Relative %: 66.2 % (ref 43.0–77.0)
Platelets: 308 10*3/uL (ref 150.0–400.0)
RDW: 14.3 % (ref 11.5–14.6)

## 2013-06-05 LAB — BASIC METABOLIC PANEL
BUN: 15 mg/dL (ref 6–23)
Chloride: 101 mEq/L (ref 96–112)
Creatinine, Ser: 0.7 mg/dL (ref 0.4–1.5)
Glucose, Bld: 82 mg/dL (ref 70–99)
Potassium: 5.2 mEq/L — ABNORMAL HIGH (ref 3.5–5.1)

## 2013-06-05 NOTE — Patient Instructions (Signed)
No specific treatment needed today Please consider OTC glycerin supp or dulcolox if the constipation becomes more of a problem Please continue all other medications as before, and refills have been done if requested. Please have the pharmacy call with any other refills you may need.

## 2013-06-07 ENCOUNTER — Encounter: Payer: Self-pay | Admitting: Internal Medicine

## 2013-06-07 DIAGNOSIS — I10 Essential (primary) hypertension: Secondary | ICD-10-CM | POA: Insufficient documentation

## 2013-06-07 HISTORY — DX: Essential (primary) hypertension: I10

## 2013-06-07 NOTE — Assessment & Plan Note (Signed)
stable overall by history and exam, recent data reviewed with pt, and pt to continue medical treatment as before,  to f/u any worsening symptoms or concerns BP Readings from Last 3 Encounters:  06/05/13 130/80  05/28/13 130/78  05/06/13 186/85

## 2013-06-07 NOTE — Assessment & Plan Note (Signed)
No signfiicant change except for tracking of the bruising subq distally, ok to follow, pt educated, reassured, likely to take approx 4 wks to resolve

## 2013-06-07 NOTE — Assessment & Plan Note (Signed)
Ok to cont coumadin as is

## 2013-06-07 NOTE — Progress Notes (Signed)
Subjective:    Patient ID: Ricky [name], male    DOB: 11-09-1922, 79 y.o.   MRN: [id number]  HPI  Here to f/u, unfort suffered a fall to left buttock sept 6 with initial pain and bruise to the site, on chronic coumadin related to afib, no other injury or bleeding, he noticed yesterday the bruising seemed to be tracking lower to the left greater trochanter area and wondering if somehow something is getting worse.  No increased pain, swelling, fever, or worsening ambulation.  Pt denies chest pain, increased sob or doe, wheezing, orthopnea, PND, increased LE swelling, palpitations, dizziness or syncope. Aug 27 INR 2.1 Past Medical History  Diagnosis Date  . HYPOTHYROIDISM 09/04/2007  . ANXIETY 04/26/2008  . Essential hypertension, benign 10/03/2007  . LYMPHADENOPATHY 04/06/2009  . TOBACCO USE, QUIT 07/14/2009  . Atrial fibrillation 10/31/2010    Coumadin therapy;  Echo 6/12: EF 50-55%, moderate MR, moderate TR, PASP 52, mild LAE  . Pacemaker     Status post AV nodal ablation  . Nocturia   . BPH (benign prostatic hypertrophy)   . HTN (hypertension) 06/07/2013   Past Surgical History  Procedure Laterality Date  . Pacemaker insertion  10/16/05  . Colonoscopy    . Eye surgery  2000    eye muscle release  . Eye surgery      both cataracts  . Inguinal hernia repair Right 05/06/2013    Procedure: HERNIA REPAIR INGUINAL ADULT;  Surgeon: Currie Paris, MD;  Location: [facility];  Service: General;  Laterality: Right;    reports that he quit smoking about 34 years ago. He does not have any smokeless tobacco history on file. He reports that he does not drink alcohol or use illicit drugs. family history includes Cancer in his mother; Heart disease in his father; Hypertension in his mother and other. Allergies  Allergen Reactions  . Amiodarone Hcl     Weakness in muscles   . Amoxicillin     Rash  . Atorvastatin     unknown  . Benazepril Hcl     unknown  . Clonidine  Hydrochloride     unknown  . Colesevelam     unknown  . Diltiazem Hcl     unknown  . Ezetimibe     Unknown    Current Outpatient Prescriptions on File Prior to Visit  Medication Sig Dispense Refill  . carvedilol (COREG) 25 MG tablet Take 1 tablet (25 mg total) by mouth 2 (two) times daily.  180 tablet  3  . Cholecalciferol 1000 UNITS tablet Take 1,000 Units by mouth daily.        . clindamycin (CLEOCIN) 150 MG capsule Take 150 mg by mouth 3 (three) times daily.      Marland Kitchen EPINEPHrine (EPIPEN 2-PAK) 0.3 mg/0.3 mL DEVI Inject 0.3 mg into the muscle once as needed. For allergic reaction      . HYDROcodone-acetaminophen (NORCO) 5-325 MG per tablet Take 1 tablet by mouth every 4 (four) hours as needed for pain.  30 tablet  0  . irbesartan (AVAPRO) 300 MG tablet Take 1 tablet (300 mg total) by mouth at bedtime.  90 tablet  3  . levothyroxine (SYNTHROID, LEVOTHROID) 75 MCG tablet Take 75 mcg by mouth daily.      . Multiple Vitamin (MULTIVITAMIN) tablet Take 1 tablet by mouth daily.        . nitroGLYCERIN (NITROSTAT) 0.4 MG SL tablet Place 0.4 mg under the tongue every 5 (five)  minutes as needed. For chest pain      . torsemide (DEMADEX) 20 MG tablet Take 20 mg by mouth daily.      Marland Kitchen warfarin (COUMADIN) 5 MG tablet TAKE ONE TABLET BY MOUTH EVERY DAY. TAKE AS DIRECTED BY COUMADIN CLINIC.  90 tablet  0  . warfarin (COUMADIN) 5 MG tablet Take 1 tablet (5 mg total) by mouth daily.  90 tablet  0  . diphenhydrAMINE (BENADRYL) 25 mg capsule Take 1 capsule (25 mg total) by mouth every 4 (four) hours as needed for itching (1-2 for bee stings).  30 capsule  2   No current facility-administered medications on file prior to visit.   Review of Systems All otherwise neg per pt     Objective:   Physical Exam BP 130/80  Pulse 65  Temp(Src) 97.4 F (36.3 C) (Oral)  Ht 6' (1.829 m)  Wt 162 lb (73.483 kg)  BMI 21.97 kg/m2  SpO2 95% VS noted,  Constitutional: Pt appears well-developed and well-nourished.   HENT: Head: NCAT.  Right Ear: External ear normal.  Left Ear: External ear normal.  Eyes: Conjunctivae and EOM are normal. Pupils are equal, round, and reactive to light.  Neck: Normal range of motion. Neck supple.  Cardiovascular: Normal rate and regular rhythm.   Pulmonary/Chest: Effort normal and breath sounds normal.  Neurological: Pt is alert. Not confused , ambulates ok Skin: Skin is warm. No erythema. Left buttock hematoma noted aprox 4x4 cm area with tracking of the bruising subq to the greater trochanter without other swelling, tender Psychiatric: Pt behavior is normal. Thought content normal.     Assessment & Plan:

## 2013-06-09 ENCOUNTER — Ambulatory Visit (INDEPENDENT_AMBULATORY_CARE_PROVIDER_SITE_OTHER): Payer: Medicare Other | Admitting: Surgery

## 2013-06-09 ENCOUNTER — Encounter (INDEPENDENT_AMBULATORY_CARE_PROVIDER_SITE_OTHER): Payer: Self-pay | Admitting: Surgery

## 2013-06-09 VITALS — BP 140/83 | HR 68 | Temp 98.6°F | Resp 12 | Ht 72.0 in | Wt 161.0 lb

## 2013-06-09 DIAGNOSIS — K4090 Unilateral inguinal hernia, without obstruction or gangrene, not specified as recurrent: Secondary | ICD-10-CM

## 2013-06-09 NOTE — Patient Instructions (Signed)
We will see you again on an as needed basis. Please call the office at [phone] if you have any questions or concerns. Thank you for allowing us to take care of you.  

## 2013-06-09 NOTE — Progress Notes (Signed)
NAME: Ricky [name]                                            DOB: 02-03-1923 DATE: 06/09/2013                                                  MRN: [id number]  CC:  Chief Complaint  Patient presents with  . Routine Post Op    hernia repair    HPI: This patient comes in for post op follow-up .Heunderwent RIH repair  on 05/06/13. He feels that he is doing well.  PE:  VITAL SIGNS: BP 140/83  Pulse 68  Temp(Src) 98.6 F (37 C) (Temporal)  Resp 12  Ht 6' (1.829 m)  Wt 161 lb (73.029 kg)  BMI 21.83 kg/m2  General: The patient appears to be healthy, NAD Repair is solid, no infection or other problem  DATA REVIEWED: No new data  IMPRESSION: The patient is doing well S/P RIH repair.    PLAN: Resume normal activities. RTC PRN

## 2013-06-24 ENCOUNTER — Ambulatory Visit (INDEPENDENT_AMBULATORY_CARE_PROVIDER_SITE_OTHER): Payer: Medicare Other | Admitting: General Practice

## 2013-06-24 DIAGNOSIS — Z7901 Long term (current) use of anticoagulants: Secondary | ICD-10-CM

## 2013-06-24 DIAGNOSIS — I4891 Unspecified atrial fibrillation: Secondary | ICD-10-CM

## 2013-06-24 LAB — POCT INR: INR: 3.2

## 2013-07-22 ENCOUNTER — Ambulatory Visit (INDEPENDENT_AMBULATORY_CARE_PROVIDER_SITE_OTHER): Payer: Medicare Other | Admitting: General Practice

## 2013-07-22 DIAGNOSIS — I4891 Unspecified atrial fibrillation: Secondary | ICD-10-CM

## 2013-07-22 DIAGNOSIS — Z7901 Long term (current) use of anticoagulants: Secondary | ICD-10-CM

## 2013-07-22 LAB — POCT INR: INR: 3

## 2013-07-22 NOTE — Progress Notes (Signed)
Pre-visit discussion using our clinic review tool. No additional management support is needed unless otherwise documented below in the visit note.  

## 2013-08-11 ENCOUNTER — Other Ambulatory Visit: Payer: Self-pay | Admitting: Internal Medicine

## 2013-08-19 ENCOUNTER — Ambulatory Visit (INDEPENDENT_AMBULATORY_CARE_PROVIDER_SITE_OTHER): Payer: Medicare Other | Admitting: General Practice

## 2013-08-19 DIAGNOSIS — I4891 Unspecified atrial fibrillation: Secondary | ICD-10-CM

## 2013-08-19 DIAGNOSIS — Z7901 Long term (current) use of anticoagulants: Secondary | ICD-10-CM

## 2013-08-19 NOTE — Progress Notes (Signed)
Pre-visit discussion using our clinic review tool. No additional management support is needed unless otherwise documented below in the visit note.  

## 2013-09-01 ENCOUNTER — Emergency Department (HOSPITAL_COMMUNITY): Payer: Medicare Other

## 2013-09-01 ENCOUNTER — Encounter (HOSPITAL_COMMUNITY): Payer: Self-pay | Admitting: Emergency Medicine

## 2013-09-01 ENCOUNTER — Emergency Department (HOSPITAL_COMMUNITY)
Admission: EM | Admit: 2013-09-01 | Discharge: 2013-09-01 | Disposition: A | Discharge status: Home / Self Care | Payer: Medicare Other | Attending: Emergency Medicine | Admitting: Emergency Medicine

## 2013-09-01 DIAGNOSIS — Z950 Presence of cardiac pacemaker: Secondary | ICD-10-CM | POA: Insufficient documentation

## 2013-09-01 DIAGNOSIS — Z7901 Long term (current) use of anticoagulants: Secondary | ICD-10-CM | POA: Insufficient documentation

## 2013-09-01 DIAGNOSIS — Y9389 Activity, other specified: Secondary | ICD-10-CM | POA: Insufficient documentation

## 2013-09-01 DIAGNOSIS — IMO0002 Reserved for concepts with insufficient information to code with codable children: Secondary | ICD-10-CM

## 2013-09-01 DIAGNOSIS — S59909A Unspecified injury of unspecified elbow, initial encounter: Secondary | ICD-10-CM | POA: Insufficient documentation

## 2013-09-01 DIAGNOSIS — Z8659 Personal history of other mental and behavioral disorders: Secondary | ICD-10-CM | POA: Insufficient documentation

## 2013-09-01 DIAGNOSIS — M25522 Pain in left elbow: Secondary | ICD-10-CM

## 2013-09-01 DIAGNOSIS — Z79899 Other long term (current) drug therapy: Secondary | ICD-10-CM | POA: Insufficient documentation

## 2013-09-01 DIAGNOSIS — S6990XA Unspecified injury of unspecified wrist, hand and finger(s), initial encounter: Secondary | ICD-10-CM | POA: Insufficient documentation

## 2013-09-01 DIAGNOSIS — S79919A Unspecified injury of unspecified hip, initial encounter: Secondary | ICD-10-CM | POA: Insufficient documentation

## 2013-09-01 DIAGNOSIS — I10 Essential (primary) hypertension: Secondary | ICD-10-CM | POA: Insufficient documentation

## 2013-09-01 DIAGNOSIS — Z880 Allergy status to penicillin: Secondary | ICD-10-CM | POA: Insufficient documentation

## 2013-09-01 DIAGNOSIS — Y929 Unspecified place or not applicable: Secondary | ICD-10-CM | POA: Insufficient documentation

## 2013-09-01 DIAGNOSIS — S51009A Unspecified open wound of unspecified elbow, initial encounter: Secondary | ICD-10-CM | POA: Insufficient documentation

## 2013-09-01 DIAGNOSIS — E039 Hypothyroidism, unspecified: Secondary | ICD-10-CM | POA: Insufficient documentation

## 2013-09-01 DIAGNOSIS — Z87448 Personal history of other diseases of urinary system: Secondary | ICD-10-CM | POA: Insufficient documentation

## 2013-09-01 DIAGNOSIS — W010XXA Fall on same level from slipping, tripping and stumbling without subsequent striking against object, initial encounter: Secondary | ICD-10-CM | POA: Insufficient documentation

## 2013-09-01 DIAGNOSIS — W19XXXA Unspecified fall, initial encounter: Secondary | ICD-10-CM

## 2013-09-01 DIAGNOSIS — I4891 Unspecified atrial fibrillation: Secondary | ICD-10-CM | POA: Insufficient documentation

## 2013-09-01 DIAGNOSIS — Z792 Long term (current) use of antibiotics: Secondary | ICD-10-CM | POA: Insufficient documentation

## 2013-09-01 DIAGNOSIS — M25552 Pain in left hip: Secondary | ICD-10-CM

## 2013-09-01 DIAGNOSIS — Z87891 Personal history of nicotine dependence: Secondary | ICD-10-CM | POA: Insufficient documentation

## 2013-09-01 MED ORDER — HYDROCODONE-ACETAMINOPHEN 5-325 MG PO TABS: 1.0000 | ORAL_TABLET | Freq: Four times a day (QID) | ORAL | Status: DC | PRN

## 2013-09-01 NOTE — ED Notes (Signed)
Suture cart set up at bedside  

## 2013-09-01 NOTE — ED Notes (Signed)
Pt refusing to get into a gown.

## 2013-09-01 NOTE — ED Provider Notes (Signed)
TIME SEEN: 11:13 AM  CHIEF COMPLAINT: Fall  HPI: Patient is an 79 year old male with a history of hypothyroidism, hypertension, A. fib on Coumadin who presents emergency department after a mechanical fall today. Patient reports that he was getting in his car and reached to grab the steering well with his hand slipped and he fell backwards. He states he landed on his left elbow and left hip. He did not strike his head or lose consciousness. Denies any chest pain, shortness breath, palpitations or dizziness that led to his fall. Patient has a skin tear to his left elbow and swelling to the left hip. He has been able to ambulate since the fall. He states his last tetanus vaccination was within the last 5 years.  ROS: See HPI Constitutional: no fever  Eyes: no drainage  ENT: no runny nose   Cardiovascular:  no chest pain  Resp: no SOB  GI: no vomiting GU: no dysuria Integumentary: no rash  Allergy: no hives  Musculoskeletal: no leg swelling  Neurological: no slurred speech ROS otherwise negative  PAST MEDICAL HISTORY/PAST SURGICAL HISTORY:  Past Medical History  Diagnosis Date  . HYPOTHYROIDISM 09/04/2007  . ANXIETY 04/26/2008  . Essential hypertension, benign 10/03/2007  . LYMPHADENOPATHY 04/06/2009  . TOBACCO USE, QUIT 07/14/2009  . Atrial fibrillation 10/31/2010    Coumadin therapy;  Echo 6/12: EF 50-55%, moderate MR, moderate TR, PASP 52, mild LAE  . Pacemaker     Status post AV nodal ablation  . Nocturia   . BPH (benign prostatic hypertrophy)   . HTN (hypertension) 06/07/2013    MEDICATIONS:  Prior to Admission medications   Medication Sig Start Date End Date Taking? Authorizing Provider  amoxicillin (AMOXIL) 500 MG capsule  05/20/13   Historical Provider, MD  carvedilol (COREG) 25 MG tablet Take 1 tablet (25 mg total) by mouth 2 (two) times daily. 11/26/12 11/26/13  Georgina Quint Plotnikov, MD  chlorhexidine (PERIDEX) 0.12 % solution  05/21/13   Historical Provider, MD  Cholecalciferol  1000 UNITS tablet Take 1,000 Units by mouth daily.      Historical Provider, MD  clindamycin (CLEOCIN) 150 MG capsule Take 150 mg by mouth 3 (three) times daily.    Historical Provider, MD  diphenhydrAMINE (BENADRYL) 25 mg capsule Take 1 capsule (25 mg total) by mouth every 4 (four) hours as needed for itching (1-2 for bee stings). 02/20/12 02/19/13  Georgina Quint Plotnikov, MD  EPINEPHrine (EPIPEN 2-PAK) 0.3 mg/0.3 mL DEVI Inject 0.3 mg into the muscle once as needed. For allergic reaction    Historical Provider, MD  HYDROcodone-acetaminophen (NORCO) 5-325 MG per tablet Take 1 tablet by mouth every 4 (four) hours as needed for pain. 05/06/13   Currie Paris, MD  ibuprofen (ADVIL,MOTRIN) 600 MG tablet  05/20/13   Historical Provider, MD  irbesartan (AVAPRO) 300 MG tablet TAKE ONE TABLET BY MOUTH AT BEDTIME 08/11/13   Tresa Garter, MD  levothyroxine (SYNTHROID, LEVOTHROID) 75 MCG tablet Take 75 mcg by mouth daily.    Historical Provider, MD  Multiple Vitamin (MULTIVITAMIN) tablet Take 1 tablet by mouth daily.      Historical Provider, MD  nitroGLYCERIN (NITROSTAT) 0.4 MG SL tablet Place 0.4 mg under the tongue every 5 (five) minutes as needed. For chest pain    Historical Provider, MD  torsemide (DEMADEX) 20 MG tablet Take 20 mg by mouth daily. 11/26/12   Georgina Quint Plotnikov, MD  warfarin (COUMADIN) 5 MG tablet TAKE ONE TABLET BY MOUTH EVERY DAY. TAKE  AS DIRECTED BY COUMADIN CLINIC. 03/26/13   Georgina Quint Plotnikov, MD  warfarin (COUMADIN) 5 MG tablet Take 1 tablet (5 mg total) by mouth daily. 03/30/13   Tresa Garter, MD    ALLERGIES:  Allergies  Allergen Reactions  . Amiodarone Hcl     Weakness in muscles   . Amoxicillin     Rash  . Atorvastatin     unknown  . Benazepril Hcl     unknown  . Clonidine Hydrochloride     unknown  . Colesevelam     unknown  . Diltiazem Hcl     unknown  . Ezetimibe     Unknown     SOCIAL HISTORY:  History  Substance Use Topics  . Smoking status:  Former Smoker    Quit date: 05/01/1979  . Smokeless tobacco: Not on file  . Alcohol Use: No    FAMILY HISTORY: Family History  Problem Relation Age of Onset  . Hypertension Other   . Hypertension Mother   . Cancer Mother     uterine/cervical  . Heart disease Father     EXAM: BP 155/79  Pulse 64  Temp(Src) 97.9 F (36.6 C) (Oral)  Resp 18  Wt 155 lb (70.308 kg)  SpO2 100% CONSTITUTIONAL: Alert and oriented and responds appropriately to questions. Well-appearing; well-nourished; GCS 15 HEAD: Normocephalic; atraumatic EYES: Conjunctivae clear, PERRL, EOMI ENT: normal nose; no rhinorrhea; moist mucous membranes; pharynx without lesions noted; no dental injury; no septal hematoma NECK: Supple, no meningismus, no LAD; no midline spinal tenderness, step-off or deformity CARD: RRR; S1 and S2 appreciated; no murmurs, no clicks, no rubs, no gallops RESP: Normal chest excursion without splinting or tachypnea; breath sounds clear and equal bilaterally; no wheezes, no rhonchi, no rales; chest wall stable, nontender to palpation ABD/GI: Normal bowel sounds; non-distended; soft, non-tender, no rebound, no guarding PELVIS:  stable, nontender to palpation; large area of swelling to the posterior lateral aspect of the left hip with full range of motion, patient is able to weight-bear BACK:  The back appears normal and is non-tender to palpation, there is no CVA tenderness; no midline spinal tenderness, step-off or deformity EXT: Normal ROM in all joints; non-tender to palpation; no edema; normal capillary refill; no cyanosis    SKIN: Normal color for age and race; warm; patient has an 10 cm  laceration to his left lateral elbow that extends to the posterior elbow underneath the joint but does not involve the joint capsule and there is no exposed bone NEURO: Moves all extremities equally; normal gait PSYCH: The patient's mood and manner are appropriate. Grooming and personal hygiene are  appropriate.  MEDICAL DECISION MAKING: Patient here with mechanical fall. His x-ray show no bony injury. Will clean and repair patient's wound. His tetanus is up-to-date. We'll discharge home with pain medication and strict return precautions and wound care instructions. Patient verbalizes understanding and is comfortable with plan.     LACERATION REPAIR Performed by: Raelyn Number Authorized by: Raelyn Number Consent: Verbal consent obtained. Risks and benefits: risks, benefits and alternatives were discussed Consent given by: patient Patient identity confirmed: provided demographic data Prepped and Draped in normal sterile fashion Wound explored  Laceration Location: Left lateral elbow  Laceration Length: 10 cm  No Foreign Bodies seen or palpated  Anesthesia: local infiltration  Local anesthetic: lidocaine 2% with epinephrine  Anesthetic total: 7 ml  Irrigation method: syringe Amount of cleaning: standard  Skin closure: Superficial   Number of  sutures: 10   Technique: Wound anesthetized with 2% Xylocaine with epinephrine, patient was irrigated with 500 mL of normal saline and cleaned with Betadine prior to suturing; prepped and draped in the sterile fashion; Repair of wound using 10 superficial simple interrupted sutures using 4.0 Prolene; bacitracin applied to wound and covered with sterile gauze.   Patient tolerance: Patient tolerated the procedure well with no immediate complications.      Layla Maw Pace Lamadrid, DO 09/01/13 1334

## 2013-09-01 NOTE — ED Notes (Signed)
Pt states he fell this morning.  Pt has abrasion/skin tear to L elbow.  Pt also c/o L hip discomfort and swelling rated as 3/10 on pain scale.  Pt ambulatory with 1 assist and in NAD.

## 2013-09-01 NOTE — ED Notes (Signed)
Pt reports he was opening the door to his car when his hand slipped off and he hit his elbow onto the wall then fell on the ground, reports he landed on his buttocks. Pt c/o pain to left elbow and left hip. Pt sts he was able to get up and walk afterwards. Good distal pulses. Nad, skin warm and dry, resp e/u.

## 2013-09-01 NOTE — ED Notes (Signed)
Pt returned from radiology.

## 2013-09-07 ENCOUNTER — Encounter: Payer: Self-pay | Admitting: Internal Medicine

## 2013-09-07 ENCOUNTER — Ambulatory Visit (INDEPENDENT_AMBULATORY_CARE_PROVIDER_SITE_OTHER): Payer: Medicare Other | Admitting: Internal Medicine

## 2013-09-07 VITALS — BP 134/60 | HR 68 | Temp 97.4°F | Resp 16 | Wt 168.0 lb

## 2013-09-07 DIAGNOSIS — S51812A Laceration without foreign body of left forearm, initial encounter: Secondary | ICD-10-CM | POA: Insufficient documentation

## 2013-09-07 DIAGNOSIS — S51812S Laceration without foreign body of left forearm, sequela: Secondary | ICD-10-CM

## 2013-09-07 DIAGNOSIS — S51812D Laceration without foreign body of left forearm, subsequent encounter: Secondary | ICD-10-CM

## 2013-09-07 DIAGNOSIS — IMO0002 Reserved for concepts with insufficient information to code with codable children: Secondary | ICD-10-CM

## 2013-09-07 DIAGNOSIS — Z5189 Encounter for other specified aftercare: Secondary | ICD-10-CM

## 2013-09-07 DIAGNOSIS — S7002XD Contusion of left hip, subsequent encounter: Secondary | ICD-10-CM

## 2013-09-07 DIAGNOSIS — I4891 Unspecified atrial fibrillation: Secondary | ICD-10-CM

## 2013-09-07 NOTE — Assessment & Plan Note (Signed)
Coumadin rx 

## 2013-09-07 NOTE — Progress Notes (Signed)
   Subjective:     HPI  F/u ER visit on 12/16 after a fall: s/p Repair of wound using 10 superficial simple interrupted sutures using 4.0 Prolene; bacitracin applied to wound and covered with sterile gauze.    C/o a fall on sat night - fell in his bedroom- tripped. C/o L elbow bruise and L him  The patient presents for a follow-up of  chronic A fib, hypertension, chronic dyslipidemia, allergy to wasp stings   F/u blepharospasm BP is elevated at times, nl at home No CHF   BP Readings from Last 3 Encounters:  09/07/13 134/60  09/01/13 172/83  06/09/13 140/83   Wt Readings from Last 3 Encounters:  09/07/13 168 lb (76.204 kg)  09/01/13 155 lb (70.308 kg)  06/09/13 161 lb (73.029 kg)      Review of Systems  Constitutional: Negative for appetite change, fatigue and unexpected weight change.  HENT: Negative for congestion, nosebleeds, sneezing and trouble swallowing.   Eyes: Negative for itching and visual disturbance.  Respiratory: Positive for chest tightness.   Cardiovascular: Negative for palpitations.  Gastrointestinal: Negative for nausea, diarrhea, blood in stool and abdominal distention.  Genitourinary: Negative for frequency and hematuria.  Musculoskeletal: Negative for back pain, gait problem and joint swelling.  Neurological: Negative for dizziness, tremors, speech difficulty and weakness.  Psychiatric/Behavioral: Negative for sleep disturbance, dysphoric mood and agitation. The patient is not nervous/anxious.        Objective:   Physical Exam  Constitutional: He is oriented to person, place, and time. He appears well-developed. He appears distressed.  Mild resp distress  HENT:  Mouth/Throat: Oropharynx is clear and moist.  Eyes: Conjunctivae are normal. Pupils are equal, round, and reactive to light.  Neck: Normal range of motion. No JVD present. No thyromegaly present.  Cardiovascular: Intact distal pulses.  Exam reveals no gallop and no friction rub.    Murmur heard. Pulmonary/Chest: Effort normal. No respiratory distress. He has no wheezes. He has rales (B). He exhibits no tenderness.  Abdominal: Soft. Bowel sounds are normal. He exhibits no distension and no mass. There is no tenderness. There is no rebound and no guarding.  Musculoskeletal: Normal range of motion. He exhibits edema (2+ B). He exhibits no tenderness.  Lymphadenopathy:    He has no cervical adenopathy.  Neurological: He is alert and oriented to person, place, and time. He has normal reflexes. No cranial nerve deficit. He exhibits normal muscle tone. Coordination normal.  Skin: Skin is warm and dry. No rash noted.  Psychiatric: He has a normal mood and affect. His behavior is normal. Judgment and thought content normal.   R groin/RLQ hernia repaired L prox forearm with laceration and sutures Large L hip hematoma   Lab Results  Component Value Date   WBC 8.2 06/05/2013   HGB 12.0* 06/05/2013   HCT 35.5* 06/05/2013   PLT 308.0 06/05/2013   GLUCOSE 82 06/05/2013   CHOL 164 11/08/2011   TRIG 47.0 11/08/2011   HDL 68.10 11/08/2011   LDLCALC 87 11/08/2011   ALT 21 10/22/2012   AST 31 10/22/2012   NA 134* 06/05/2013   K 5.2* 06/05/2013   CL 101 06/05/2013   CREATININE 0.7 06/05/2013   BUN 15 06/05/2013   CO2 30 06/05/2013   TSH 0.92 06/05/2013   PSA 0.81 02/20/2012   INR 2.9 08/19/2013           Assessment & Plan:

## 2013-09-07 NOTE — Assessment & Plan Note (Signed)
Warm compress

## 2013-09-07 NOTE — Progress Notes (Signed)
Pre visit review using our clinic review tool, if applicable. No additional management support is needed unless otherwise documented below in the visit note. 

## 2013-09-07 NOTE — Assessment & Plan Note (Addendum)
09/01/13 s/p repair w/sutures Re-dressed; coban RTC 1 wk

## 2013-09-14 ENCOUNTER — Ambulatory Visit (INDEPENDENT_AMBULATORY_CARE_PROVIDER_SITE_OTHER): Payer: Medicare Other | Admitting: Internal Medicine

## 2013-09-14 ENCOUNTER — Encounter: Payer: Self-pay | Admitting: Internal Medicine

## 2013-09-14 VITALS — BP 130/62 | HR 68 | Temp 97.0°F | Resp 16 | Wt 165.0 lb

## 2013-09-14 DIAGNOSIS — IMO0002 Reserved for concepts with insufficient information to code with codable children: Secondary | ICD-10-CM

## 2013-09-14 DIAGNOSIS — S51812D Laceration without foreign body of left forearm, subsequent encounter: Secondary | ICD-10-CM

## 2013-09-14 NOTE — Assessment & Plan Note (Signed)
Sutures were removed  

## 2013-09-14 NOTE — Progress Notes (Signed)
Patient ID: Ricky [name], male   DOB: July 09, 1923, 79 y.o.   MRN: [id number]  I removed sutures (L forearm and elbow laceration). Dressed w/bandaid. Clean wound.

## 2013-09-14 NOTE — Progress Notes (Signed)
Pre visit review using our clinic review tool, if applicable. No additional management support is needed unless otherwise documented below in the visit note. 

## 2013-09-23 ENCOUNTER — Ambulatory Visit (INDEPENDENT_AMBULATORY_CARE_PROVIDER_SITE_OTHER): Payer: Medicare HMO | Admitting: General Practice

## 2013-09-23 DIAGNOSIS — I4891 Unspecified atrial fibrillation: Secondary | ICD-10-CM

## 2013-09-23 DIAGNOSIS — Z7901 Long term (current) use of anticoagulants: Secondary | ICD-10-CM

## 2013-09-23 LAB — POCT INR: INR: 2.3

## 2013-09-23 NOTE — Progress Notes (Signed)
Pre-visit discussion using our clinic review tool. No additional management support is needed unless otherwise documented below in the visit note.  

## 2013-09-29 ENCOUNTER — Encounter: Payer: Self-pay | Admitting: Internal Medicine

## 2013-09-29 ENCOUNTER — Ambulatory Visit (INDEPENDENT_AMBULATORY_CARE_PROVIDER_SITE_OTHER): Payer: Medicare HMO | Admitting: Internal Medicine

## 2013-09-29 VITALS — BP 120/60 | HR 76 | Temp 97.6°F | Resp 16 | Wt 164.0 lb

## 2013-09-29 DIAGNOSIS — Z860100 Personal history of colon polyps, unspecified: Secondary | ICD-10-CM

## 2013-09-29 DIAGNOSIS — I10 Essential (primary) hypertension: Secondary | ICD-10-CM

## 2013-09-29 DIAGNOSIS — E039 Hypothyroidism, unspecified: Secondary | ICD-10-CM

## 2013-09-29 DIAGNOSIS — I4891 Unspecified atrial fibrillation: Secondary | ICD-10-CM

## 2013-09-29 MED ORDER — WARFARIN SODIUM 5 MG PO TABS: 5.0000 mg | ORAL_TABLET | Freq: Every day | ORAL | Status: DC

## 2013-09-29 NOTE — Progress Notes (Signed)
   Subjective:     HPI    F/u a fall on sat night - fell in his bedroom- tripped. C/o L elbow and L hip bruises - resolved ? Re: colonoscopy  The patient presents for a follow-up of  chronic A fib, hypertension, chronic dyslipidemia, allergy to wasp stings   F/u blepharospasm BP is elevated at times, nl at home No CHF   BP Readings from Last 3 Encounters:  09/29/13 120/60  09/14/13 130/62  09/07/13 134/60   Wt Readings from Last 3 Encounters:  09/29/13 164 lb (74.39 kg)  09/14/13 165 lb (74.844 kg)  09/07/13 168 lb (76.204 kg)      Review of Systems  Constitutional: Negative for appetite change, fatigue and unexpected weight change.  HENT: Negative for congestion, nosebleeds, sneezing and trouble swallowing.   Eyes: Negative for itching and visual disturbance.  Respiratory: Positive for chest tightness.   Cardiovascular: Negative for palpitations.  Gastrointestinal: Negative for nausea, diarrhea, blood in stool and abdominal distention.  Genitourinary: Negative for frequency and hematuria.  Musculoskeletal: Negative for back pain, gait problem and joint swelling.  Neurological: Negative for dizziness, tremors, speech difficulty and weakness.  Psychiatric/Behavioral: Negative for sleep disturbance, dysphoric mood and agitation. The patient is not nervous/anxious.        Objective:   Physical Exam  Constitutional: He is oriented to person, place, and time. He appears well-developed. He appears distressed.  Mild resp distress  HENT:  Mouth/Throat: Oropharynx is clear and moist.  Eyes: Conjunctivae are normal. Pupils are equal, round, and reactive to light.  Neck: Normal range of motion. No JVD present. No thyromegaly present.  Cardiovascular: Intact distal pulses.  Exam reveals no gallop and no friction rub.   Murmur heard. Pulmonary/Chest: Effort normal. No respiratory distress. He has no wheezes. He has rales (B). He exhibits no tenderness.  Abdominal: Soft.  Bowel sounds are normal. He exhibits no distension and no mass. There is no tenderness. There is no rebound and no guarding.  Musculoskeletal: Normal range of motion. He exhibits edema (2+ B). He exhibits no tenderness.  Lymphadenopathy:    He has no cervical adenopathy.  Neurological: He is alert and oriented to person, place, and time. He has normal reflexes. No cranial nerve deficit. He exhibits normal muscle tone. Coordination normal.  Skin: Skin is warm and dry. No rash noted.  Psychiatric: He has a normal mood and affect. His behavior is normal. Judgment and thought content normal.   R groin/RLQ hernia repaired L prox forearm with laceration and sutures Large L hip hematoma   Lab Results  Component Value Date   WBC 8.2 06/05/2013   HGB 12.0* 06/05/2013   HCT 35.5* 06/05/2013   PLT 308.0 06/05/2013   GLUCOSE 82 06/05/2013   CHOL 164 11/08/2011   TRIG 47.0 11/08/2011   HDL 68.10 11/08/2011   LDLCALC 87 11/08/2011   ALT 21 10/22/2012   AST 31 10/22/2012   NA 134* 06/05/2013   K 5.2* 06/05/2013   CL 101 06/05/2013   CREATININE 0.7 06/05/2013   BUN 15 06/05/2013   CO2 30 06/05/2013   TSH 0.92 06/05/2013   PSA 0.81 02/20/2012   INR 2.3 09/23/2013           Assessment & Plan:

## 2013-09-29 NOTE — Assessment & Plan Note (Signed)
F/u w/Dr Magod Pt would like to discuss w/Dr [facility] weather he would need another colon Thx

## 2013-09-29 NOTE — Assessment & Plan Note (Signed)
Continue with current prescription therapy as reflected on the Med list.  

## 2013-09-29 NOTE — Progress Notes (Signed)
Pre visit review using our clinic review tool, if applicable. No additional management support is needed unless otherwise documented below in the visit note. 

## 2013-11-04 ENCOUNTER — Ambulatory Visit (INDEPENDENT_AMBULATORY_CARE_PROVIDER_SITE_OTHER): Payer: Medicare HMO | Admitting: General Practice

## 2013-11-04 ENCOUNTER — Encounter: Payer: Self-pay | Admitting: *Deleted

## 2013-11-04 DIAGNOSIS — I4891 Unspecified atrial fibrillation: Secondary | ICD-10-CM

## 2013-11-04 DIAGNOSIS — Z7901 Long term (current) use of anticoagulants: Secondary | ICD-10-CM

## 2013-11-04 LAB — POCT INR: INR: 2.8

## 2013-11-04 NOTE — Patient Instructions (Signed)
Instructions for dental procedure on 2/24.  Hold coumadin on 2/22 and 2/23.  Resume dosage on 2/24.

## 2013-11-04 NOTE — Progress Notes (Signed)
Pre-visit discussion using our clinic review tool. No additional management support is needed unless otherwise documented below in the visit note.  

## 2013-12-15 ENCOUNTER — Encounter: Payer: Self-pay | Admitting: Internal Medicine

## 2013-12-15 ENCOUNTER — Ambulatory Visit (INDEPENDENT_AMBULATORY_CARE_PROVIDER_SITE_OTHER): Payer: Commercial Managed Care - HMO | Admitting: Internal Medicine

## 2013-12-15 ENCOUNTER — Telehealth: Payer: Self-pay | Admitting: Internal Medicine

## 2013-12-15 VITALS — BP 173/87 | HR 72 | Ht 72.0 in | Wt 160.0 lb

## 2013-12-15 DIAGNOSIS — I442 Atrioventricular block, complete: Secondary | ICD-10-CM

## 2013-12-15 DIAGNOSIS — Z950 Presence of cardiac pacemaker: Secondary | ICD-10-CM

## 2013-12-15 DIAGNOSIS — I4891 Unspecified atrial fibrillation: Secondary | ICD-10-CM

## 2013-12-15 LAB — BASIC METABOLIC PANEL
BUN: 26 mg/dL — ABNORMAL HIGH (ref 6–23)
CHLORIDE: 97 meq/L (ref 96–112)
CO2: 33 mEq/L — ABNORMAL HIGH (ref 19–32)
Calcium: 9.2 mg/dL (ref 8.4–10.5)
Creatinine, Ser: 1 mg/dL (ref 0.4–1.5)
GFR: 79.45 mL/min (ref >60.00)
Glucose, Bld: 70 mg/dL (ref 70–99)
Potassium: 4.5 mEq/L (ref 3.5–5.1)
SODIUM: 136 meq/L (ref 135–145)

## 2013-12-15 LAB — MDC_IDC_ENUM_SESS_TYPE_INCLINIC
Battery Voltage: 2.74 V
Implantable Pulse Generator Model: 5816
Lead Channel Impedance Value: 402 Ohm
Lead Channel Pacing Threshold Amplitude: 0.5 V
Lead Channel Pacing Threshold Pulse Width: 0.8 ms
Lead Channel Sensing Intrinsic Amplitude: 5 mV
Lead Channel Setting Sensing Sensitivity: 4 mV
MDC IDC MSMT BATTERY IMPEDANCE: 2800 Ohm
MDC IDC PG SERIAL: 1647680
MDC IDC SESS DTM: 20150331112445
MDC IDC SET LEADCHNL RV PACING PULSEWIDTH: 0.8 ms
MDC IDC STAT BRADY RV PERCENT PACED: 99 % — AB

## 2013-12-15 MED ORDER — CARVEDILOL 25 MG PO TABS: 25.0000 mg | ORAL_TABLET | Freq: Two times a day (BID) | ORAL | Status: DC

## 2013-12-15 NOTE — Telephone Encounter (Signed)
New problem   Pt stated he was given a card for a free 30 day subscription for Eliquis and he stated it was dated 09/16/2013 he want to know if he could use it.

## 2013-12-15 NOTE — Patient Instructions (Signed)
Your physician recommends that you continue on your current medications as directed. Please refer to the Current Medication list given to you today.  Your physician recommends that you return for lab work today: BMET  Your physician wants you to follow-up in: 6 months with device clinic.  You will receive a reminder letter in the mail two months in advance. If you don't receive a letter, please call our office to schedule the follow-up appointment.  Your physician wants you to follow-up in: 1 year with Dr. [name].  You will receive a reminder letter in the mail two months in advance. If you don't receive a letter, please call our office to schedule the follow-up appointment.  Please speak with Coumadin clinic, that follows you, about starting Eliquis

## 2013-12-15 NOTE — Progress Notes (Signed)
Patient Care Team: Cassandria Anger, MD as PCP - General Deboraha Sprang, MD (Cardiology)   HPI  Ricky [name] is a 80 y.o. male is seen in followup for atrial fibrillation that is permanent. He is status post AV junction ablation following 2 failed Pulmonary Vein ablation procedures. He is status post pacemaker implantation and is device dependent.   He now walks with a cane for stability. He is interested in discussing NOACs.  Marland Kitchen  He is currently feeling better   Past Medical History  Diagnosis Date  . HYPOTHYROIDISM 09/04/2007  . ANXIETY 04/26/2008  . Essential hypertension, benign 10/03/2007  . LYMPHADENOPATHY 04/06/2009  . TOBACCO USE, QUIT 07/14/2009  . Atrial fibrillation 10/31/2010    Coumadin therapy;  Echo 6/12: EF 50-55%, moderate MR, moderate TR, PASP 52, mild LAE  . Pacemaker     Status post AV nodal ablation  . Nocturia   . BPH (benign prostatic hypertrophy)   . HTN (hypertension) 06/07/2013    Past Surgical History  Procedure Laterality Date  . Pacemaker insertion  10/16/05  . Colonoscopy    . Eye surgery  2000    eye muscle release  . Eye surgery      both cataracts  . Inguinal hernia repair Right 05/06/2013    Procedure: HERNIA REPAIR INGUINAL ADULT;  Surgeon: Haywood Lasso, MD;  Location: [facility];  Service: General;  Laterality: Right;  . Hernia repair      Current Outpatient Prescriptions  Medication Sig Dispense Refill  . Cholecalciferol 1000 UNITS tablet Take 1,000 Units by mouth daily.        Marland Kitchen EPINEPHrine (EPIPEN 2-PAK) 0.3 mg/0.3 mL DEVI Inject 0.3 mg into the muscle once as needed. For allergic reaction      . HYDROcodone-acetaminophen (NORCO/VICODIN) 5-325 MG per tablet Take 1 tablet by mouth every 6 (six) hours as needed.  15 tablet  0  . irbesartan (AVAPRO) 300 MG tablet TAKE ONE TABLET BY MOUTH AT BEDTIME  90 tablet  2  . levothyroxine (SYNTHROID, LEVOTHROID) 75 MCG tablet Take 75 mcg by mouth daily.      .  Multiple Vitamin (MULTIVITAMIN) tablet Take 1 tablet by mouth daily.        Marland Kitchen torsemide (DEMADEX) 10 MG tablet Take 10 mg by mouth at bedtime.      Marland Kitchen warfarin (COUMADIN) 5 MG tablet Take 1 tablet (5 mg total) by mouth daily.  90 tablet  3  . carvedilol (COREG) 25 MG tablet Take 1 tablet (25 mg total) by mouth 2 (two) times daily.  180 tablet  3  . diphenhydrAMINE (BENADRYL) 25 mg capsule Take 1 capsule (25 mg total) by mouth every 4 (four) hours as needed for itching (1-2 for bee stings).  30 capsule  2   No current facility-administered medications for this visit.    Allergies  Allergen Reactions  . Amiodarone Hcl     Weakness in muscles   . Amoxicillin     Rash  . Atorvastatin     unknown  . Bee Venom   . Benazepril Hcl     unknown  . Clonidine Hydrochloride     unknown  . Colesevelam     unknown  . Diltiazem Hcl     unknown  . Ezetimibe     Unknown     Review of Systems negative except from HPI and PMH  Physical Exam BP 173/87  Pulse 72  Ht  6' (1.829 m)  Wt 160 lb (72.576 kg)  BMI 21.70 kg/m2 Well developed and well nourished in no acute distress HENT normal E scleral and icterus clear Neck Supple JVP flat; carotids brisk and full Clear to ausculation  Regular rate and rhythm, no murmurs gallops or rub Soft with active bowel sounds No clubbing cyanosis  Edema Alert and oriented, grossly normal motor and sensory function Skin Warm and Dry  ECG demonstrates atrial fibrillation with ventricular pacing  Assessment and  Plan  Atrial fibrillation-permanent  Complete heart block s/p AV ablation  Pacemaker-[employer] The patient's device was interrogated and the information was fully reviewed.  The device was reprogrammed to VVIR. This was associated with some improved longevity  Hypertension    Mr. Wagley is doing well. I am concerned about his potassium level being 5.2 white last blood draw on 9/14 we will check it again. He would like to go on apixaban.    We discussed the use of the NOACs compared to Coumadin. We briefly reviewed the data of at least comparability in stroke prevention, bleeding and outcome. We discussed data for apixoban shown to be comparable in bleeding risk to aspirin.  He says his blood pressures at home are normal in the 120 range.  We'll anticipate the initiation of apixaban at the Coumadin clinic tomorrow. By then he is renal function should be available. I suspect 5 mg twice daily for dose. He is given a starting.

## 2013-12-15 NOTE — Telephone Encounter (Signed)
Informed pt that he may use Eliquis card, that it will still work (even with 09/16/13 exp date on it) Pt agreeable to plan

## 2013-12-16 ENCOUNTER — Other Ambulatory Visit: Payer: Self-pay | Admitting: General Practice

## 2013-12-16 ENCOUNTER — Other Ambulatory Visit: Payer: Self-pay | Admitting: *Deleted

## 2013-12-16 ENCOUNTER — Ambulatory Visit (INDEPENDENT_AMBULATORY_CARE_PROVIDER_SITE_OTHER): Payer: Commercial Managed Care - HMO | Admitting: General Practice

## 2013-12-16 DIAGNOSIS — I4891 Unspecified atrial fibrillation: Secondary | ICD-10-CM

## 2013-12-16 DIAGNOSIS — Z5181 Encounter for therapeutic drug level monitoring: Secondary | ICD-10-CM | POA: Insufficient documentation

## 2013-12-16 LAB — POCT INR: INR: 2.8

## 2013-12-16 MED ORDER — APIXABAN 5 MG PO TABS: 5.0000 mg | ORAL_TABLET | Freq: Two times a day (BID) | ORAL | Status: DC

## 2013-12-16 NOTE — Progress Notes (Signed)
Pre visit review using our clinic review tool, if applicable. No additional management support is needed unless otherwise documented below in the visit note. 

## 2013-12-22 ENCOUNTER — Other Ambulatory Visit: Payer: Self-pay | Admitting: *Deleted

## 2013-12-22 DIAGNOSIS — Z0389 Encounter for observation for other suspected diseases and conditions ruled out: Secondary | ICD-10-CM

## 2013-12-22 DIAGNOSIS — Z0000 Encounter for general adult medical examination without abnormal findings: Secondary | ICD-10-CM

## 2013-12-28 ENCOUNTER — Other Ambulatory Visit (INDEPENDENT_AMBULATORY_CARE_PROVIDER_SITE_OTHER): Payer: Medicare HMO

## 2013-12-28 DIAGNOSIS — Z0000 Encounter for general adult medical examination without abnormal findings: Secondary | ICD-10-CM

## 2013-12-28 DIAGNOSIS — Z0389 Encounter for observation for other suspected diseases and conditions ruled out: Secondary | ICD-10-CM

## 2013-12-28 LAB — URINALYSIS, ROUTINE W REFLEX MICROSCOPIC
Bilirubin Urine: NEGATIVE
HGB URINE DIPSTICK: NEGATIVE
Ketones, ur: NEGATIVE
Leukocytes, UA: NEGATIVE
Nitrite: NEGATIVE
RBC / HPF: NONE SEEN (ref 0–?)
Specific Gravity, Urine: 1.01 (ref 1.000–1.030)
Total Protein, Urine: NEGATIVE
Urine Glucose: NEGATIVE
Urobilinogen, UA: 0.2 (ref 0.0–1.0)
pH: 6 (ref 5.0–8.0)

## 2013-12-28 LAB — CBC WITH DIFFERENTIAL/PLATELET
BASOS ABS: 0 10*3/uL (ref 0.0–0.1)
Basophils Relative: 0.6 % (ref 0.0–3.0)
Eosinophils Absolute: 0.2 10*3/uL (ref 0.0–0.7)
Eosinophils Relative: 3.4 % (ref 0.0–5.0)
HCT: 41.1 % (ref 39.0–52.0)
Hemoglobin: 13.7 g/dL (ref 13.0–17.0)
Lymphocytes Relative: 30.2 % (ref 12.0–46.0)
Lymphs Abs: 1.8 10*3/uL (ref 0.7–4.0)
MCHC: 33.3 g/dL (ref 30.0–36.0)
MCV: 100.6 fl — ABNORMAL HIGH (ref 78.0–100.0)
Monocytes Absolute: 0.6 10*3/uL (ref 0.1–1.0)
Monocytes Relative: 9.9 % (ref 3.0–12.0)
Neutro Abs: 3.4 10*3/uL (ref 1.4–7.7)
Neutrophils Relative %: 55.9 % (ref 43.0–77.0)
PLATELETS: 218 10*3/uL (ref 150.0–400.0)
RBC: 4.08 Mil/uL — ABNORMAL LOW (ref 4.22–5.81)
RDW: 13.5 % (ref 11.5–14.6)
WBC: 6.1 10*3/uL (ref 4.5–10.5)

## 2013-12-28 LAB — BASIC METABOLIC PANEL
BUN: 25 mg/dL — AB (ref 6–23)
CALCIUM: 9.1 mg/dL (ref 8.4–10.5)
CO2: 32 mEq/L (ref 19–32)
Chloride: 100 mEq/L (ref 96–112)
Creatinine, Ser: 0.9 mg/dL (ref 0.4–1.5)
GFR: 84.56 mL/min (ref >60.00)
GLUCOSE: 91 mg/dL (ref 70–99)
Potassium: 4.6 mEq/L (ref 3.5–5.1)
SODIUM: 139 meq/L (ref 135–145)

## 2013-12-28 LAB — HEPATIC FUNCTION PANEL
ALBUMIN: 3.8 g/dL (ref 3.5–5.2)
ALT: 21 U/L (ref 0–53)
AST: 27 U/L (ref 0–37)
Alkaline Phosphatase: 65 U/L (ref 39–117)
Bilirubin, Direct: 0.1 mg/dL (ref 0.0–0.3)
TOTAL PROTEIN: 6.7 g/dL (ref 6.0–8.3)
Total Bilirubin: 0.7 mg/dL (ref 0.3–1.2)

## 2013-12-28 LAB — TSH: TSH: 1.73 u[IU]/mL (ref 0.35–5.50)

## 2013-12-28 LAB — LIPID PANEL
CHOLESTEROL: 210 mg/dL — AB (ref 0–200)
HDL: 63.4 mg/dL (ref >39.00)
LDL Cholesterol: 135 mg/dL — ABNORMAL HIGH (ref 0–99)
Total CHOL/HDL Ratio: 3
Triglycerides: 59 mg/dL (ref 0.0–149.0)
VLDL: 11.8 mg/dL (ref 0.0–40.0)

## 2013-12-28 LAB — PSA: PSA: 0.84 ng/mL (ref 0.10–4.00)

## 2013-12-30 ENCOUNTER — Encounter: Payer: Self-pay | Admitting: Internal Medicine

## 2013-12-30 ENCOUNTER — Ambulatory Visit (INDEPENDENT_AMBULATORY_CARE_PROVIDER_SITE_OTHER): Payer: Commercial Managed Care - HMO | Admitting: Internal Medicine

## 2013-12-30 VITALS — BP 150/78 | HR 72 | Temp 97.6°F | Resp 16 | Ht 72.0 in | Wt 165.0 lb

## 2013-12-30 DIAGNOSIS — Z950 Presence of cardiac pacemaker: Secondary | ICD-10-CM

## 2013-12-30 DIAGNOSIS — I10 Essential (primary) hypertension: Secondary | ICD-10-CM

## 2013-12-30 DIAGNOSIS — Z86010 Personal history of colonic polyps: Secondary | ICD-10-CM

## 2013-12-30 DIAGNOSIS — I4891 Unspecified atrial fibrillation: Secondary | ICD-10-CM

## 2013-12-30 DIAGNOSIS — Z23 Encounter for immunization: Secondary | ICD-10-CM

## 2013-12-30 DIAGNOSIS — Z0000 Encounter for general adult medical examination without abnormal findings: Secondary | ICD-10-CM

## 2013-12-30 MED ORDER — APIXABAN 5 MG PO TABS: 5.0000 mg | ORAL_TABLET | Freq: Two times a day (BID) | ORAL | Status: AC

## 2013-12-30 NOTE — Assessment & Plan Note (Signed)
Pt would like to do a Cologuard in place of colonoscopy

## 2013-12-30 NOTE — Assessment & Plan Note (Signed)
Doing well 

## 2013-12-30 NOTE — Addendum Note (Signed)
Addended by: Cresenciano Lick on: 12/30/2013 02:20 PM   Modules accepted: Orders

## 2013-12-30 NOTE — Assessment & Plan Note (Signed)
Continue with current prescription therapy as reflected on the Med list. On Eloquis now

## 2013-12-30 NOTE — Progress Notes (Signed)
Pre visit review using our clinic review tool, if applicable. No additional management support is needed unless otherwise documented below in the visit note. 

## 2013-12-30 NOTE — Progress Notes (Signed)
   Subjective:     HPI  The patient is here for a wellness exam. The patient has been doing well overall without major physical or psychological issues going on lately.  F/u hernia repair R   The patient presents for a follow-up of  chronic A fib, hypertension, chronic dyslipidemia, allergy to wasp stings   F/u blepharospasm BP is elevated at times, nl at home No CHF   BP Readings from Last 3 Encounters:  12/30/13 150/78  12/15/13 173/87  09/29/13 120/60   Wt Readings from Last 3 Encounters:  12/30/13 165 lb (74.844 kg)  12/15/13 160 lb (72.576 kg)  09/29/13 164 lb (74.39 kg)      Review of Systems  Constitutional: Negative for appetite change, fatigue and unexpected weight change.  HENT: Negative for congestion, nosebleeds, sneezing and trouble swallowing.   Eyes: Negative for itching and visual disturbance.  Respiratory: Positive for chest tightness.   Cardiovascular: Negative for palpitations.  Gastrointestinal: Negative for nausea, diarrhea, blood in stool and abdominal distention.  Genitourinary: Negative for frequency and hematuria.  Musculoskeletal: Negative for back pain, gait problem and joint swelling.  Neurological: Negative for dizziness, tremors, speech difficulty and weakness.  Psychiatric/Behavioral: Negative for sleep disturbance, dysphoric mood and agitation. The patient is not nervous/anxious.        Objective:   Physical Exam  Constitutional: He is oriented to person, place, and time. He appears well-developed. He appears distressed.  Mild resp distress  HENT:  Mouth/Throat: Oropharynx is clear and moist.  Eyes: Conjunctivae are normal. Pupils are equal, round, and reactive to light.  Neck: Normal range of motion. No JVD present. No thyromegaly present.  Cardiovascular: Intact distal pulses.  Exam reveals no gallop and no friction rub.   Murmur heard. Pulmonary/Chest: Effort normal. No respiratory distress. He has no wheezes. He has rales (B).  He exhibits no tenderness.  Abdominal: Soft. Bowel sounds are normal. He exhibits no distension and no mass. There is no tenderness. There is no rebound and no guarding.  Musculoskeletal: Normal range of motion. He exhibits edema (2+ B). He exhibits no tenderness.  Lymphadenopathy:    He has no cervical adenopathy.  Neurological: He is alert and oriented to person, place, and time. He has normal reflexes. No cranial nerve deficit. He exhibits normal muscle tone. Coordination normal.  Skin: Skin is warm and dry. No rash noted.  Psychiatric: He has a normal mood and affect. His behavior is normal. Judgment and thought content normal.   R groin/RLQ hernia repaired    Lab Results  Component Value Date   WBC 6.1 12/28/2013   HGB 13.7 12/28/2013   HCT 41.1 12/28/2013   PLT 218.0 12/28/2013   GLUCOSE 91 12/28/2013   CHOL 210* 12/28/2013   TRIG 59.0 12/28/2013   HDL 63.40 12/28/2013   LDLCALC 135* 12/28/2013   ALT 21 12/28/2013   AST 27 12/28/2013   NA 139 12/28/2013   K 4.6 12/28/2013   CL 100 12/28/2013   CREATININE 0.9 12/28/2013   BUN 25* 12/28/2013   CO2 32 12/28/2013   TSH 1.73 12/28/2013   PSA 0.84 12/28/2013   INR 2.8 12/16/2013           Assessment & Plan:

## 2013-12-30 NOTE — Assessment & Plan Note (Signed)

## 2013-12-30 NOTE — Assessment & Plan Note (Signed)
Continue with current prescription therapy as reflected on the Med list.  

## 2014-01-12 LAB — COLOGUARD: COLOGUARD: NEGATIVE

## 2014-01-13 ENCOUNTER — Other Ambulatory Visit: Payer: Self-pay | Admitting: Internal Medicine

## 2014-01-19 ENCOUNTER — Telehealth: Payer: Self-pay | Admitting: *Deleted

## 2014-01-19 MED ORDER — LINACLOTIDE 290 MCG PO CAPS: 290.0000 ug | ORAL_CAPSULE | Freq: Every day | ORAL | Status: DC

## 2014-01-19 NOTE — Telephone Encounter (Signed)
Pt sent a fax to MD c/o abnormal BM since 12/30/13 and he felt he had a blockage. He c/o dull belly ache.   Per MD advisement I informed pt to take Linzess 290 mcg 1 qd and call for OV if needed.

## 2014-01-27 ENCOUNTER — Telehealth: Payer: Self-pay | Admitting: *Deleted

## 2014-01-27 NOTE — Telephone Encounter (Signed)
Left detailed mess informing pt of negative colorectal cancer screening. See results under media tab.

## 2014-02-01 ENCOUNTER — Telehealth: Payer: Self-pay | Admitting: Internal Medicine

## 2014-02-01 NOTE — Telephone Encounter (Signed)
New message      How long does he need to come off eliquis due to colonoscopy on  6/10.

## 2014-02-01 NOTE — Telephone Encounter (Signed)
Advised ok to stop Eliquis 2 days prior to colonoscopy. But advised pt that his doctor's office will send [place] a release form to sign off on for colonoscopy. Pt verbalized understanding.

## 2014-02-10 ENCOUNTER — Encounter: Payer: Self-pay | Admitting: Internal Medicine

## 2014-03-24 ENCOUNTER — Telehealth: Payer: Self-pay | Admitting: Internal Medicine

## 2014-03-24 NOTE — Telephone Encounter (Signed)
New message      Did we get a fax to have pt stop eliquis prior to colonoscopy?  Please call to give clearance to stop eliquis

## 2014-03-25 NOTE — Telephone Encounter (Signed)
Faxed 7/9

## 2014-03-26 ENCOUNTER — Telehealth: Payer: Self-pay | Admitting: Internal Medicine

## 2014-03-26 NOTE — Telephone Encounter (Signed)
New Message:  Pamala Hurry states the pt is having a colonoscopy on 7/16.Marland Kitchen Need to know if pt can stop elliquis? Pamala Hurry is requesting a call back or either a answer in epic.Marland Kitchen

## 2014-03-26 NOTE — Telephone Encounter (Signed)
Left a  Pamala Hurry a detail Message and to call back .

## 2014-03-26 NOTE — Telephone Encounter (Signed)
Barbara from Dr. Reather Converse office called she states had send a surgical clearance since June 26 th for pt and she called this past Wednesday and Thursday for a respond and she was not called back . Pt needs to be off elliquis prior the Colonoscopy scheduled for 7/16. Pamala Hurry needs to know by Monday 7/ 13/th or procedure will be cancelled. Pamala Hurry is aware that this message will be send to Dr. [name] and nurse Venida Jarvis  For reviewing.

## 2014-03-29 NOTE — Telephone Encounter (Signed)
Explained to [facility] at Dr. Perley Jain office clearance was faxed out 7/8. Due to the issues we have been having with their office not receiving our faxes, they never received it. Gave verbal clearance order via phone: ok to stop Eliquis 7/14, for procedure 7/16, and restart date is GI decision. Pamala Hurry verbalized understanding of order.

## 2014-03-30 NOTE — Telephone Encounter (Signed)
Thought the interchange prompted questions of logistics and .Marland KitchenMarland Kitchen

## 2014-04-01 ENCOUNTER — Other Ambulatory Visit: Payer: Self-pay | Admitting: Gastroenterology

## 2014-04-16 ENCOUNTER — Encounter: Payer: Self-pay | Admitting: Gastroenterology

## 2014-04-22 ENCOUNTER — Other Ambulatory Visit: Payer: Self-pay

## 2014-04-22 MED ORDER — TORSEMIDE 20 MG PO TABS: ORAL_TABLET | ORAL | Status: DC

## 2014-04-22 MED ORDER — IRBESARTAN 300 MG PO TABS: ORAL_TABLET | ORAL | Status: DC

## 2014-05-03 ENCOUNTER — Telehealth: Payer: Self-pay | Admitting: Internal Medicine

## 2014-05-03 NOTE — Telephone Encounter (Signed)
New message           Pt has questions about his medication dosage

## 2014-05-03 NOTE — Telephone Encounter (Signed)
Pt was changed to Eliquis 5mg  bid from warfarin the first of April this year.  He recently saw his PCP at the [place]. Pt was told by MD at [facility] that his information suggested pt should be on Eliquis 2.5mg  bid because he is over age 80. Pt wants to be sure Dr [name] wants him to continue 5mg  bid and not change to 2.5mg  bid as the [place] doctor suggested.   Pt advised I am forwarding  to Dr [name] for review and recommendations

## 2014-05-04 ENCOUNTER — Encounter: Payer: Self-pay | Admitting: Internal Medicine

## 2014-05-04 ENCOUNTER — Ambulatory Visit (INDEPENDENT_AMBULATORY_CARE_PROVIDER_SITE_OTHER): Payer: Commercial Managed Care - HMO | Admitting: Internal Medicine

## 2014-05-04 VITALS — BP 120/62 | HR 76 | Temp 97.8°F | Resp 16 | Wt 164.0 lb

## 2014-05-04 DIAGNOSIS — I10 Essential (primary) hypertension: Secondary | ICD-10-CM

## 2014-05-04 DIAGNOSIS — I482 Chronic atrial fibrillation, unspecified: Secondary | ICD-10-CM

## 2014-05-04 DIAGNOSIS — Z23 Encounter for immunization: Secondary | ICD-10-CM

## 2014-05-04 DIAGNOSIS — E039 Hypothyroidism, unspecified: Secondary | ICD-10-CM

## 2014-05-04 DIAGNOSIS — I4891 Unspecified atrial fibrillation: Secondary | ICD-10-CM

## 2014-05-04 DIAGNOSIS — I509 Heart failure, unspecified: Secondary | ICD-10-CM

## 2014-05-04 DIAGNOSIS — I5022 Chronic systolic (congestive) heart failure: Secondary | ICD-10-CM

## 2014-05-04 NOTE — Progress Notes (Signed)
Pre visit review using our clinic review tool, if applicable. No additional management support is needed unless otherwise documented below in the visit note. 

## 2014-05-04 NOTE — Assessment & Plan Note (Signed)
Continue with current prescription therapy as reflected on the Med list.  

## 2014-05-04 NOTE — Progress Notes (Signed)
   Subjective:     HPI     The patient presents for a follow-up of  chronic A fib, hypertension, chronic dyslipidemia, allergy to wasp stings   F/u blepharospasm BP is elevated at times, nl at home No CHF   BP Readings from Last 3 Encounters:  05/04/14 120/62  12/30/13 150/78  12/15/13 173/87   Wt Readings from Last 3 Encounters:  05/04/14 164 lb (74.39 kg)  12/30/13 165 lb (74.844 kg)  12/15/13 160 lb (72.576 kg)      Review of Systems  Constitutional: Negative for appetite change, fatigue and unexpected weight change.  HENT: Negative for congestion, nosebleeds, sneezing and trouble swallowing.   Eyes: Negative for itching and visual disturbance.  Respiratory: Positive for chest tightness.   Cardiovascular: Negative for palpitations.  Gastrointestinal: Negative for nausea, diarrhea, blood in stool and abdominal distention.  Genitourinary: Negative for frequency and hematuria.  Musculoskeletal: Negative for back pain, gait problem and joint swelling.  Neurological: Negative for dizziness, tremors, speech difficulty and weakness.  Psychiatric/Behavioral: Negative for sleep disturbance, dysphoric mood and agitation. The patient is not nervous/anxious.        Objective:   Physical Exam  Constitutional: He is oriented to person, place, and time. He appears well-developed. He appears distressed.  Mild resp distress  HENT:  Mouth/Throat: Oropharynx is clear and moist.  Eyes: Conjunctivae are normal. Pupils are equal, round, and reactive to light.  Neck: Normal range of motion. No JVD present. No thyromegaly present.  Cardiovascular: Intact distal pulses.  Exam reveals no gallop and no friction rub.   Murmur heard. Pulmonary/Chest: Effort normal. No respiratory distress. He has no wheezes. He has rales (B). He exhibits no tenderness.  Abdominal: Soft. Bowel sounds are normal. He exhibits no distension and no mass. There is no tenderness. There is no rebound and no  guarding.  Musculoskeletal: Normal range of motion. He exhibits edema (2+ B). He exhibits no tenderness.  Lymphadenopathy:    He has no cervical adenopathy.  Neurological: He is alert and oriented to person, place, and time. He has normal reflexes. No cranial nerve deficit. He exhibits normal muscle tone. Coordination normal.  Skin: Skin is warm and dry. No rash noted.  Psychiatric: He has a normal mood and affect. His behavior is normal. Judgment and thought content normal.   R groin/RLQ hernia repaired  Cane    Lab Results  Component Value Date   WBC 6.1 12/28/2013   HGB 13.7 12/28/2013   HCT 41.1 12/28/2013   PLT 218.0 12/28/2013   GLUCOSE 91 12/28/2013   CHOL 210* 12/28/2013   TRIG 59.0 12/28/2013   HDL 63.40 12/28/2013   LDLCALC 135* 12/28/2013   ALT 21 12/28/2013   AST 27 12/28/2013   NA 139 12/28/2013   K 4.6 12/28/2013   CL 100 12/28/2013   CREATININE 0.9 12/28/2013   BUN 25* 12/28/2013   CO2 32 12/28/2013   TSH 1.73 12/28/2013   PSA 0.84 12/28/2013   INR 2.8 12/16/2013           Assessment & Plan:

## 2014-05-04 NOTE — Assessment & Plan Note (Signed)
Continue with current prescription therapy as reflected on the Med list. Handicapped form was filled out

## 2014-05-05 ENCOUNTER — Encounter: Payer: Self-pay | Admitting: Internal Medicine

## 2014-05-05 ENCOUNTER — Telehealth: Payer: Self-pay | Admitting: Internal Medicine

## 2014-05-05 NOTE — Telephone Encounter (Signed)
Relevant patient education assigned to patient using Emmi. ° °

## 2014-05-05 NOTE — Telephone Encounter (Signed)
If two of three are present the dose is 2.5 >80 Cr>1.5 Wt< 60 Kg Dose is per guidelines

## 2014-05-05 NOTE — Telephone Encounter (Signed)
Talked to patient and explained that he is currently taking the correct dosage per guidelines. He tells me that [place] in [place] needs copy of recent lab work to show Creatinine level. Explained that I would address this tomorrow and speak with the [facility] about this, and get lab work faxed to them. Pt is very grateful for the help with this.

## 2014-05-07 NOTE — Telephone Encounter (Signed)
Spoke with pharmacist. She is agreeable that dosage should be 5mg . Faxed last Creatinine to [phone], per request.

## 2014-06-03 ENCOUNTER — Other Ambulatory Visit: Payer: Self-pay | Admitting: Internal Medicine

## 2014-06-14 ENCOUNTER — Encounter: Payer: Self-pay | Admitting: Internal Medicine

## 2014-06-14 ENCOUNTER — Ambulatory Visit (INDEPENDENT_AMBULATORY_CARE_PROVIDER_SITE_OTHER): Payer: Commercial Managed Care - HMO | Admitting: Internal Medicine

## 2014-06-14 VITALS — BP 140/80 | HR 68 | Temp 98.1°F | Resp 14 | Ht 72.0 in | Wt 163.4 lb

## 2014-06-14 DIAGNOSIS — IMO0001 Reserved for inherently not codable concepts without codable children: Secondary | ICD-10-CM

## 2014-06-14 DIAGNOSIS — M791 Myalgia, unspecified site: Secondary | ICD-10-CM

## 2014-06-14 NOTE — Progress Notes (Signed)
   Subjective:    Patient ID: Ricky [name], male    DOB: 21-Aug-1923, 80 y.o.   MRN: [id number]  HPI The patient is an 80 YO man who is coming in today with side pain. It is tender to the touch and is worse with bending and twisting. He denies dysuria, frequency, urgency. He thinks the pain started about 1 month ago and is mild overall and has been decreasing in intensity. He has not tried anything for it as he wanted to get it checked out first. No recent falls or injuries.  Review of Systems  Constitutional: Negative for diaphoresis, activity change, appetite change and fatigue.  Respiratory: Negative for cough, chest tightness, shortness of breath and wheezing.   Cardiovascular: Negative for chest pain, palpitations and leg swelling.  Gastrointestinal: Negative for abdominal pain and abdominal distention.  Genitourinary: Negative for dysuria, urgency, frequency and flank pain.  Musculoskeletal: Positive for myalgias. Negative for arthralgias and back pain.       Objective:   Physical Exam  Constitutional: He appears well-developed and well-nourished. No distress.  HENT:  Head: Normocephalic and atraumatic.  Eyes: EOM are normal.  Neck: Normal range of motion.  Cardiovascular: Normal rate and regular rhythm.   Pulmonary/Chest: Effort normal and breath sounds normal. No respiratory distress. He has no wheezes. He has no rales.  Abdominal: Soft. Bowel sounds are normal. He exhibits no distension. There is no tenderness. There is no rebound.  Musculoskeletal: He exhibits tenderness.  No spinal tenderness over any part of the spine. Some side tenderness along the left side. Pain worse with twisting.    Filed Vitals:   06/14/14 1328  BP: 140/80  Pulse: 68  Temp: 98.1 F (36.7 C)  TempSrc: Oral  Resp: 14  Height: 6' (1.829 m)  Weight: 163 lb 6.4 oz (74.118 kg)  SpO2: 97%      Assessment & Plan:

## 2014-06-14 NOTE — Assessment & Plan Note (Signed)
Appears to be mild at best and advised heating pad. He is to use tylenol if he has pain still. Given some stretching exercises to strengthen his core muscles.

## 2014-06-14 NOTE — Progress Notes (Signed)
Pre visit review using our clinic review tool, if applicable. No additional management support is needed unless otherwise documented below in the visit note. 

## 2014-06-14 NOTE — Patient Instructions (Signed)
We think you have a strained muscle in your side. You can use a heating pad on the area to help relax the muscles.   If you have pain you can take tylenol for the pain.   Here are some stretching exercises to help strengthen the muscles.

## 2014-06-16 ENCOUNTER — Telehealth: Payer: Self-pay | Admitting: Internal Medicine

## 2014-06-16 NOTE — Telephone Encounter (Signed)
Pt called said that he put heating pad on back per Dr Doug Sou and he woke up this morning with a blister in the area where the heating pad was laying.  He would like for a nurse to call him back [phone]

## 2014-06-17 NOTE — Telephone Encounter (Signed)
Spoke with Sharyn Lull and advised the patient to leave the blister alone. If it pops, cover it with gauze and call us. Patient aware.

## 2014-06-24 ENCOUNTER — Ambulatory Visit (INDEPENDENT_AMBULATORY_CARE_PROVIDER_SITE_OTHER): Payer: Commercial Managed Care - HMO | Admitting: Internal Medicine

## 2014-06-24 ENCOUNTER — Encounter: Payer: Self-pay | Admitting: Internal Medicine

## 2014-06-24 VITALS — BP 130/74 | HR 76 | Temp 97.8°F | Resp 16 | Wt 161.0 lb

## 2014-06-24 DIAGNOSIS — M545 Low back pain, unspecified: Secondary | ICD-10-CM | POA: Insufficient documentation

## 2014-06-24 DIAGNOSIS — T2124XA Burn of second degree of lower back, initial encounter: Secondary | ICD-10-CM | POA: Insufficient documentation

## 2014-06-24 NOTE — Assessment & Plan Note (Signed)
10/15 L MSK

## 2014-06-24 NOTE — Assessment & Plan Note (Signed)
10/15 L lower back due to heating pad Topical care

## 2014-06-24 NOTE — Progress Notes (Signed)
   Subjective:     HPI   F/u L MSK LBP - resolved C/o burn ulcer due to a heating pad a few days ago (L flank)  The patient presents for a follow-up of  chronic A fib, hypertension, chronic dyslipidemia, allergy to wasp stings   F/u blepharospasm BP is elevated at times, nl at home No CHF   BP Readings from Last 3 Encounters:  06/24/14 130/74  06/14/14 140/80  05/04/14 120/62   Wt Readings from Last 3 Encounters:  06/24/14 161 lb (73.029 kg)  06/14/14 163 lb 6.4 oz (74.118 kg)  05/04/14 164 lb (74.39 kg)      Review of Systems  Constitutional: Negative for appetite change, fatigue and unexpected weight change.  HENT: Negative for congestion, nosebleeds, sneezing and trouble swallowing.   Eyes: Negative for itching and visual disturbance.  Respiratory: Positive for chest tightness.   Cardiovascular: Negative for palpitations.  Gastrointestinal: Negative for nausea, diarrhea, blood in stool and abdominal distention.  Genitourinary: Negative for frequency and hematuria.  Musculoskeletal: Negative for back pain, gait problem and joint swelling.  Neurological: Negative for dizziness, tremors, speech difficulty and weakness.  Psychiatric/Behavioral: Negative for sleep disturbance, dysphoric mood and agitation. The patient is not nervous/anxious.        Objective:   Physical Exam  Constitutional: He is oriented to person, place, and time. He appears well-developed. He appears distressed.  Mild resp distress  HENT:  Mouth/Throat: Oropharynx is clear and moist.  Eyes: Conjunctivae are normal. Pupils are equal, round, and reactive to light.  Neck: Normal range of motion. No JVD present. No thyromegaly present.  Cardiovascular: Intact distal pulses.  Exam reveals no gallop and no friction rub.   Murmur heard. Pulmonary/Chest: Effort normal. No respiratory distress. He has no wheezes. He has rales (B). He exhibits no tenderness.  Abdominal: Soft. Bowel sounds are normal. He  exhibits no distension and no mass. There is no tenderness. There is no rebound and no guarding.  Musculoskeletal: Normal range of motion. He exhibits edema (2+ B). He exhibits no tenderness.  Lymphadenopathy:    He has no cervical adenopathy.  Neurological: He is alert and oriented to person, place, and time. He has normal reflexes. No cranial nerve deficit. He exhibits normal muscle tone. Coordination normal.  Skin: Skin is warm and dry. No rash noted.  Psychiatric: He has a normal mood and affect. His behavior is normal. Judgment and thought content normal.   L flank 2x1 cm erosion and a 0.3x0.2 cm erosion  Cane    Lab Results  Component Value Date   WBC 6.1 12/28/2013   HGB 13.7 12/28/2013   HCT 41.1 12/28/2013   PLT 218.0 12/28/2013   GLUCOSE 91 12/28/2013   CHOL 210* 12/28/2013   TRIG 59.0 12/28/2013   HDL 63.40 12/28/2013   LDLCALC 135* 12/28/2013   ALT 21 12/28/2013   AST 27 12/28/2013   NA 139 12/28/2013   K 4.6 12/28/2013   CL 100 12/28/2013   CREATININE 0.9 12/28/2013   BUN 25* 12/28/2013   CO2 32 12/28/2013   TSH 1.73 12/28/2013   PSA 0.84 12/28/2013   INR 2.8 12/16/2013           Assessment & Plan:

## 2014-06-24 NOTE — Progress Notes (Signed)
Pre visit review using our clinic review tool, if applicable. No additional management support is needed unless otherwise documented below in the visit note. 

## 2014-07-14 ENCOUNTER — Encounter: Payer: Self-pay | Admitting: *Deleted

## 2014-07-29 ENCOUNTER — Ambulatory Visit (INDEPENDENT_AMBULATORY_CARE_PROVIDER_SITE_OTHER): Payer: Commercial Managed Care - HMO | Admitting: *Deleted

## 2014-07-29 DIAGNOSIS — I482 Chronic atrial fibrillation, unspecified: Secondary | ICD-10-CM

## 2014-07-29 DIAGNOSIS — I442 Atrioventricular block, complete: Secondary | ICD-10-CM

## 2014-07-29 LAB — MDC_IDC_ENUM_SESS_TYPE_INCLINIC
Battery Impedance: 3200 Ohm
Date Time Interrogation Session: 20151112122350
Implantable Pulse Generator Model: 5816
Implantable Pulse Generator Serial Number: 1647680
Lead Channel Impedance Value: 400 Ohm
Lead Channel Pacing Threshold Amplitude: 0.375 V
Lead Channel Pacing Threshold Pulse Width: 0.8 ms
Lead Channel Setting Pacing Pulse Width: 0.8 ms
Lead Channel Setting Sensing Sensitivity: 4 mV
MDC IDC MSMT BATTERY VOLTAGE: 2.76 V

## 2014-07-29 NOTE — Progress Notes (Signed)
Pacemaker check in clinic. Normal device function. Threshold and impedance consistent with previous measurements. Device programmed to maximize longevity. No high ventricular rates noted. Device programmed at appropriate safety margins. Histogram distribution appropriate for patient activity level. Device programmed to optimize intrinsic conduction. Estimated longevity 3.5-4.25 years. Patient will follow up with SK on 3-8 @ 1:30pm.

## 2014-08-24 ENCOUNTER — Encounter: Payer: Self-pay | Admitting: Internal Medicine

## 2014-09-03 ENCOUNTER — Other Ambulatory Visit (INDEPENDENT_AMBULATORY_CARE_PROVIDER_SITE_OTHER): Payer: Commercial Managed Care - HMO

## 2014-09-03 ENCOUNTER — Encounter: Payer: Self-pay | Admitting: Internal Medicine

## 2014-09-03 ENCOUNTER — Ambulatory Visit (INDEPENDENT_AMBULATORY_CARE_PROVIDER_SITE_OTHER): Payer: Commercial Managed Care - HMO | Admitting: Internal Medicine

## 2014-09-03 VITALS — BP 138/72 | HR 65 | Temp 97.5°F | Wt 169.0 lb

## 2014-09-03 DIAGNOSIS — M545 Low back pain, unspecified: Secondary | ICD-10-CM

## 2014-09-03 DIAGNOSIS — I482 Chronic atrial fibrillation, unspecified: Secondary | ICD-10-CM

## 2014-09-03 DIAGNOSIS — E032 Hypothyroidism due to medicaments and other exogenous substances: Secondary | ICD-10-CM

## 2014-09-03 DIAGNOSIS — I5022 Chronic systolic (congestive) heart failure: Secondary | ICD-10-CM

## 2014-09-03 DIAGNOSIS — I10 Essential (primary) hypertension: Secondary | ICD-10-CM

## 2014-09-03 LAB — HEPATIC FUNCTION PANEL
ALT: 17 U/L (ref 0–53)
AST: 21 U/L (ref 0–37)
Albumin: 3.9 g/dL (ref 3.5–5.2)
Alkaline Phosphatase: 76 U/L (ref 39–117)
Bilirubin, Direct: 0.1 mg/dL (ref 0.0–0.3)
TOTAL PROTEIN: 6.5 g/dL (ref 6.0–8.3)
Total Bilirubin: 0.7 mg/dL (ref 0.2–1.2)

## 2014-09-03 LAB — CBC WITH DIFFERENTIAL/PLATELET
BASOS PCT: 0.4 % (ref 0.0–3.0)
Basophils Absolute: 0 10*3/uL (ref 0.0–0.1)
Eosinophils Absolute: 0.2 10*3/uL (ref 0.0–0.7)
Eosinophils Relative: 2.9 % (ref 0.0–5.0)
HEMATOCRIT: 39.4 % (ref 39.0–52.0)
Hemoglobin: 12.8 g/dL — ABNORMAL LOW (ref 13.0–17.0)
LYMPHS ABS: 2.2 10*3/uL (ref 0.7–4.0)
LYMPHS PCT: 30.5 % (ref 12.0–46.0)
MCHC: 32.5 g/dL (ref 30.0–36.0)
MCV: 100.6 fl — ABNORMAL HIGH (ref 78.0–100.0)
MONOS PCT: 9.1 % (ref 3.0–12.0)
Monocytes Absolute: 0.7 10*3/uL (ref 0.1–1.0)
NEUTROS ABS: 4.1 10*3/uL (ref 1.4–7.7)
Neutrophils Relative %: 57.1 % (ref 43.0–77.0)
Platelets: 249 10*3/uL (ref 150.0–400.0)
RBC: 3.92 Mil/uL — AB (ref 4.22–5.81)
RDW: 14.5 % (ref 11.5–15.5)
WBC: 7.3 10*3/uL (ref 4.0–10.5)

## 2014-09-03 LAB — BASIC METABOLIC PANEL
BUN: 24 mg/dL — ABNORMAL HIGH (ref 6–23)
CALCIUM: 9 mg/dL (ref 8.4–10.5)
CO2: 30 meq/L (ref 19–32)
CREATININE: 0.9 mg/dL (ref 0.4–1.5)
Chloride: 98 mEq/L (ref 96–112)
GFR: 85.52 mL/min (ref >60.00)
Glucose, Bld: 68 mg/dL — ABNORMAL LOW (ref 70–99)
Potassium: 4.9 mEq/L (ref 3.5–5.1)
Sodium: 136 mEq/L (ref 135–145)

## 2014-09-03 NOTE — Assessment & Plan Note (Signed)
Continue with current prescription therapy as reflected on the Med list.  

## 2014-09-03 NOTE — Progress Notes (Signed)
   Subjective:     HPI   F/u L MSK LBP - resolved  The patient presents for a follow-up of  chronic A fib, hypertension, chronic dyslipidemia, allergy to wasp stings   F/u blepharospasm BP is elevated at times, nl at home No CHF   BP Readings from Last 3 Encounters:  09/03/14 138/72  06/24/14 130/74  06/14/14 140/80   Wt Readings from Last 3 Encounters:  09/03/14 169 lb (76.658 kg)  06/24/14 161 lb (73.029 kg)  06/14/14 163 lb 6.4 oz (74.118 kg)      Review of Systems  Constitutional: Negative for appetite change, fatigue and unexpected weight change.  HENT: Negative for congestion, nosebleeds, sneezing and trouble swallowing.   Eyes: Negative for itching and visual disturbance.  Respiratory: Positive for chest tightness.   Cardiovascular: Negative for palpitations.  Gastrointestinal: Negative for nausea, diarrhea, blood in stool and abdominal distention.  Genitourinary: Negative for frequency and hematuria.  Musculoskeletal: Negative for back pain, joint swelling and gait problem.  Neurological: Negative for dizziness, tremors, speech difficulty and weakness.  Psychiatric/Behavioral: Negative for sleep disturbance, dysphoric mood and agitation. The patient is not nervous/anxious.        Objective:   Physical Exam  Constitutional: He is oriented to person, place, and time. He appears well-developed. No distress.  NAD  HENT:  Mouth/Throat: Oropharynx is clear and moist.  Eyes: Conjunctivae are normal. Pupils are equal, round, and reactive to light.  Neck: Normal range of motion. No JVD present. No thyromegaly present.  Cardiovascular: Normal rate, regular rhythm, normal heart sounds and intact distal pulses.  Exam reveals no gallop and no friction rub.   No murmur heard. Pulmonary/Chest: Effort normal and breath sounds normal. No respiratory distress. He has no wheezes. He has no rales. He exhibits no tenderness.  Abdominal: Soft. Bowel sounds are normal. He  exhibits no distension and no mass. There is no tenderness. There is no rebound and no guarding.  Musculoskeletal: Normal range of motion. He exhibits no edema or tenderness.  Lymphadenopathy:    He has no cervical adenopathy.  Neurological: He is alert and oriented to person, place, and time. He has normal reflexes. No cranial nerve deficit. He exhibits normal muscle tone. He displays a negative Romberg sign. Coordination and gait normal.  No meningeal signs  Skin: Skin is warm and dry. No rash noted.  Psychiatric: He has a normal mood and affect. His behavior is normal. Judgment and thought content normal.   Sunglasses are on  Cane    Lab Results  Component Value Date   WBC 6.1 12/28/2013   HGB 13.7 12/28/2013   HCT 41.1 12/28/2013   PLT 218.0 12/28/2013   GLUCOSE 91 12/28/2013   CHOL 210* 12/28/2013   TRIG 59.0 12/28/2013   HDL 63.40 12/28/2013   LDLCALC 135* 12/28/2013   ALT 21 12/28/2013   AST 27 12/28/2013   NA 139 12/28/2013   K 4.6 12/28/2013   CL 100 12/28/2013   CREATININE 0.9 12/28/2013   BUN 25* 12/28/2013   CO2 32 12/28/2013   TSH 1.73 12/28/2013   PSA 0.84 12/28/2013   INR 2.8 12/16/2013           Assessment & Plan:

## 2014-09-03 NOTE — Assessment & Plan Note (Signed)
On Eliquis  Continue with current prescription therapy as reflected on the Med list.

## 2014-09-03 NOTE — Progress Notes (Signed)
Pre visit review using our clinic review tool, if applicable. No additional management support is needed unless otherwise documented below in the visit note. 

## 2014-09-06 ENCOUNTER — Other Ambulatory Visit: Payer: Commercial Managed Care - HMO

## 2014-09-06 DIAGNOSIS — E038 Other specified hypothyroidism: Secondary | ICD-10-CM

## 2014-09-07 LAB — TSH: TSH: 0.979 u[IU]/mL (ref 0.350–4.500)

## 2014-10-29 ENCOUNTER — Telehealth: Payer: Self-pay | Admitting: Internal Medicine

## 2014-11-23 ENCOUNTER — Encounter: Payer: Self-pay | Admitting: Internal Medicine

## 2014-11-23 ENCOUNTER — Ambulatory Visit (INDEPENDENT_AMBULATORY_CARE_PROVIDER_SITE_OTHER): Payer: PPO | Admitting: Internal Medicine

## 2014-11-23 VITALS — BP 148/68 | HR 78 | Ht 72.0 in | Wt 169.8 lb

## 2014-11-23 DIAGNOSIS — I482 Chronic atrial fibrillation, unspecified: Secondary | ICD-10-CM

## 2014-11-23 DIAGNOSIS — I442 Atrioventricular block, complete: Secondary | ICD-10-CM

## 2014-11-23 DIAGNOSIS — Z45018 Encounter for adjustment and management of other part of cardiac pacemaker: Secondary | ICD-10-CM

## 2014-11-23 LAB — MDC_IDC_ENUM_SESS_TYPE_INCLINIC
Date Time Interrogation Session: 20160308145415
Implantable Pulse Generator Serial Number: 1647680
Lead Channel Impedance Value: 402 Ohm
Lead Channel Pacing Threshold Amplitude: 0.375 V
Lead Channel Pacing Threshold Pulse Width: 0.8 ms
Lead Channel Setting Pacing Pulse Width: 0.8 ms
Lead Channel Setting Sensing Sensitivity: 4 mV
MDC IDC MSMT BATTERY IMPEDANCE: 3700 Ohm
MDC IDC MSMT BATTERY VOLTAGE: 2.76 V
MDC IDC STAT BRADY RV PERCENT PACED: 99 % — AB

## 2014-11-23 NOTE — Progress Notes (Signed)
Patient Care Team: Cassandria Anger, MD as PCP - General Deboraha Sprang, MD (Cardiology)   HPI  Ricky [name] is a 81 y.o. male is seen in followup for atrial fibrillation that is permanent. He is status post AV junction ablation following 2 failed Pulmonary Vein ablation procedures. He is status post pacemaker implantation and is device dependent.   He now walks with a cane for stability. He is interested in discussing NOACs.  Marland Kitchen  He is currently feeling better   Past Medical History  Diagnosis Date  . HYPOTHYROIDISM 09/04/2007  . ANXIETY 04/26/2008  . Essential hypertension, benign 10/03/2007  . LYMPHADENOPATHY 04/06/2009  . TOBACCO USE, QUIT 07/14/2009  . Atrial fibrillation 10/31/2010    Coumadin therapy;  Echo 6/12: EF 50-55%, moderate MR, moderate TR, PASP 52, mild LAE  . Pacemaker     Status post AV nodal ablation  . Nocturia   . BPH (benign prostatic hypertrophy)   . HTN (hypertension) 06/07/2013    Past Surgical History  Procedure Laterality Date  . Pacemaker insertion  10/16/05  . Colonoscopy    . Eye surgery  2000    eye muscle release  . Eye surgery      both cataracts  . Inguinal hernia repair Right 05/06/2013    Procedure: HERNIA REPAIR INGUINAL ADULT;  Surgeon: Haywood Lasso, MD;  Location: [facility];  Service: General;  Laterality: Right;  . Hernia repair      Current Outpatient Prescriptions  Medication Sig Dispense Refill  . apixaban (ELIQUIS) 5 MG TABS tablet Take 1 tablet (5 mg total) by mouth 2 (two) times daily. 180 tablet 3  . Ascorbic Acid (VITAMIN C PO) Take 1 tablet by mouth daily.    . carvedilol (COREG) 25 MG tablet Take 1 tablet (25 mg total) by mouth 2 (two) times daily. 180 tablet 3  . Cholecalciferol 1000 UNITS tablet Take 1,000 Units by mouth daily.      . diphenhydrAMINE (BENADRYL) 25 mg capsule Take 1 capsule (25 mg total) by mouth every 4 (four) hours as needed for itching (1-2 for bee stings). 30  capsule 2  . EPINEPHrine (EPIPEN 2-PAK) 0.3 mg/0.3 mL DEVI Inject 0.3 mg into the muscle once as needed. For allergic reaction    . irbesartan (AVAPRO) 300 MG tablet TAKE ONE TABLET BY MOUTH AT BEDTIME 90 tablet 2  . levothyroxine (SYNTHROID, LEVOTHROID) 75 MCG tablet Take 75 mcg by mouth daily.    . Multiple Vitamin (MULTIVITAMIN) tablet Take 1 tablet by mouth daily.      Marland Kitchen torsemide (DEMADEX) 10 MG tablet Take 10 mg by mouth. Take 20 mg in the AM, 10 MG PM     No current facility-administered medications for this visit.    Allergies  Allergen Reactions  . Amiodarone Hcl     Weakness in muscles   . Amoxicillin     Rash  . Atorvastatin     unknown  . Bee Venom   . Benazepril Hcl     unknown  . Clonidine Hydrochloride     unknown  . Colesevelam     unknown  . Diltiazem Hcl     unknown  . Ezetimibe     Unknown     Review of Systems negative except from HPI and PMH  Physical Exam There were no vitals taken for this visit. Well developed and well nourished in no acute distress HENT normal E scleral and icterus  clear Neck Supple JVP flat; carotids brisk and full Clear to ausculation  Regular rate and rhythm, no murmurs gallops or rub Soft with active bowel sounds No clubbing cyanosis  Edema Alert and oriented, grossly normal motor and sensory function Skin Warm and Dry  ECG demonstrates atrial fibrillation with ventricular pacing  Assessment and  Plan  Atrial fibrillation-permanent  Complete heart block s/p AV ablation  Pacemaker-[employer]. Jude The patient's device was interrogated and the information was fully reviewed.  The device was reprogrammed to VVIR. This was associated with some improved longevity  Hypertension    M

## 2014-11-23 NOTE — Patient Instructions (Signed)

## 2014-12-01 ENCOUNTER — Encounter: Payer: Self-pay | Admitting: Internal Medicine

## 2014-12-23 ENCOUNTER — Ambulatory Visit (INDEPENDENT_AMBULATORY_CARE_PROVIDER_SITE_OTHER): Payer: PPO | Admitting: Internal Medicine

## 2014-12-23 ENCOUNTER — Encounter: Payer: Self-pay | Admitting: Internal Medicine

## 2014-12-23 ENCOUNTER — Other Ambulatory Visit (INDEPENDENT_AMBULATORY_CARE_PROVIDER_SITE_OTHER): Payer: PPO

## 2014-12-23 VITALS — BP 130/70 | HR 95 | Wt 171.0 lb

## 2014-12-23 DIAGNOSIS — I482 Chronic atrial fibrillation, unspecified: Secondary | ICD-10-CM

## 2014-12-23 DIAGNOSIS — I10 Essential (primary) hypertension: Secondary | ICD-10-CM | POA: Diagnosis not present

## 2014-12-23 DIAGNOSIS — E032 Hypothyroidism due to medicaments and other exogenous substances: Secondary | ICD-10-CM

## 2014-12-23 DIAGNOSIS — I5022 Chronic systolic (congestive) heart failure: Secondary | ICD-10-CM | POA: Diagnosis not present

## 2014-12-23 LAB — CBC WITH DIFFERENTIAL/PLATELET
BASOS PCT: 0.3 % (ref 0.0–3.0)
Basophils Absolute: 0 10*3/uL (ref 0.0–0.1)
EOS ABS: 0.2 10*3/uL (ref 0.0–0.7)
EOS PCT: 3 % (ref 0.0–5.0)
HCT: 38.5 % — ABNORMAL LOW (ref 39.0–52.0)
Hemoglobin: 13.1 g/dL (ref 13.0–17.0)
LYMPHS ABS: 2.1 10*3/uL (ref 0.7–4.0)
Lymphocytes Relative: 31.1 % (ref 12.0–46.0)
MCHC: 34.2 g/dL (ref 30.0–36.0)
MCV: 98.6 fl (ref 78.0–100.0)
MONO ABS: 0.6 10*3/uL (ref 0.1–1.0)
Monocytes Relative: 9.1 % (ref 3.0–12.0)
NEUTROS ABS: 3.8 10*3/uL (ref 1.4–7.7)
Neutrophils Relative %: 56.5 % (ref 43.0–77.0)
Platelets: 236 10*3/uL (ref 150.0–400.0)
RBC: 3.9 Mil/uL — ABNORMAL LOW (ref 4.22–5.81)
RDW: 14.1 % (ref 11.5–15.5)
WBC: 6.6 10*3/uL (ref 4.0–10.5)

## 2014-12-23 LAB — BASIC METABOLIC PANEL
BUN: 22 mg/dL (ref 6–23)
CO2: 31 mEq/L (ref 19–32)
Calcium: 9.1 mg/dL (ref 8.4–10.5)
Chloride: 101 mEq/L (ref 96–112)
Creatinine, Ser: 1.05 mg/dL (ref 0.40–1.50)
GFR: 70.62 mL/min (ref >60.00)
GLUCOSE: 95 mg/dL (ref 70–99)
POTASSIUM: 4.6 meq/L (ref 3.5–5.1)
Sodium: 138 mEq/L (ref 135–145)

## 2014-12-23 LAB — TSH: TSH: 1.14 u[IU]/mL (ref 0.35–4.50)

## 2014-12-23 MED ORDER — DIPHENHYDRAMINE HCL 25 MG PO CAPS: 25.0000 mg | ORAL_CAPSULE | ORAL | Status: AC | PRN

## 2014-12-23 MED ORDER — TORSEMIDE 10 MG PO TABS: 10.0000 mg | ORAL_TABLET | Freq: Once | ORAL | Status: DC

## 2014-12-23 MED ORDER — IRBESARTAN 300 MG PO TABS: ORAL_TABLET | ORAL | Status: DC

## 2014-12-23 NOTE — Assessment & Plan Note (Signed)
Chronic  Coreg, Demadex, Losartan

## 2014-12-23 NOTE — Assessment & Plan Note (Signed)
On Synthroid

## 2014-12-23 NOTE — Assessment & Plan Note (Signed)
Coreg, Demadex, Eliquis, Losartan Labs

## 2014-12-23 NOTE — Assessment & Plan Note (Signed)
Coreg, Demadex, Eliquis, Losartan 

## 2014-12-23 NOTE — Progress Notes (Signed)
   Subjective:     HPI   F/u L MSK LBP - resolved  The patient presents for a follow-up of  chronic A fib, hypertension, chronic dyslipidemia, allergy to wasp stings   F/u blepharospasm BP is elevated at times, nl at home No CHF   BP Readings from Last 3 Encounters:  12/23/14 130/70  11/23/14 148/68  09/03/14 138/72   Wt Readings from Last 3 Encounters:  12/23/14 171 lb (77.565 kg)  11/23/14 169 lb 12.8 oz (77.021 kg)  09/03/14 169 lb (76.658 kg)      Review of Systems  Constitutional: Negative for appetite change, fatigue and unexpected weight change.  HENT: Negative for congestion, nosebleeds, sneezing and trouble swallowing.   Eyes: Negative for itching and visual disturbance.  Respiratory: Positive for chest tightness.   Cardiovascular: Negative for palpitations.  Gastrointestinal: Negative for nausea, diarrhea, blood in stool and abdominal distention.  Genitourinary: Negative for frequency and hematuria.  Musculoskeletal: Negative for back pain, joint swelling and gait problem.  Neurological: Negative for dizziness, tremors, speech difficulty and weakness.  Psychiatric/Behavioral: Negative for sleep disturbance, dysphoric mood and agitation. The patient is not nervous/anxious.        Objective:   Physical Exam  Constitutional: He is oriented to person, place, and time. He appears well-developed. No distress.  NAD  HENT:  Mouth/Throat: Oropharynx is clear and moist.  Eyes: Conjunctivae are normal. Pupils are equal, round, and reactive to light.  Neck: Normal range of motion. No JVD present. No thyromegaly present.  Cardiovascular: Normal rate, regular rhythm, normal heart sounds and intact distal pulses.  Exam reveals no gallop and no friction rub.   No murmur heard. Pulmonary/Chest: Effort normal and breath sounds normal. No respiratory distress. He has no wheezes. He has no rales. He exhibits no tenderness.  Abdominal: Soft. Bowel sounds are normal. He  exhibits no distension and no mass. There is no tenderness. There is no rebound and no guarding.  Musculoskeletal: Normal range of motion. He exhibits no edema or tenderness.  Lymphadenopathy:    He has no cervical adenopathy.  Neurological: He is alert and oriented to person, place, and time. He has normal reflexes. No cranial nerve deficit. He exhibits normal muscle tone. He displays a negative Romberg sign. Coordination and gait normal.  No meningeal signs  Skin: Skin is warm and dry. No rash noted.  Psychiatric: He has a normal mood and affect. His behavior is normal. Judgment and thought content normal.  scars from AKs treated on the face Sunglasses are on Cane    Lab Results  Component Value Date   WBC 7.3 09/03/2014   HGB 12.8* 09/03/2014   HCT 39.4 09/03/2014   PLT 249.0 09/03/2014   GLUCOSE 68* 09/03/2014   CHOL 210* 12/28/2013   TRIG 59.0 12/28/2013   HDL 63.40 12/28/2013   LDLCALC 135* 12/28/2013   ALT 17 09/03/2014   AST 21 09/03/2014   NA 136 09/03/2014   K 4.9 09/03/2014   CL 98 09/03/2014   CREATININE 0.9 09/03/2014   BUN 24* 09/03/2014   CO2 30 09/03/2014   TSH 0.979 09/06/2014   PSA 0.84 12/28/2013   INR 2.8 12/16/2013           Assessment & Plan:

## 2014-12-23 NOTE — Progress Notes (Signed)
Pre visit review using our clinic review tool, if applicable. No additional management support is needed unless otherwise documented below in the visit note. 

## 2014-12-31 ENCOUNTER — Ambulatory Visit: Payer: Commercial Managed Care - HMO | Admitting: Internal Medicine

## 2015-04-15 ENCOUNTER — Ambulatory Visit (INDEPENDENT_AMBULATORY_CARE_PROVIDER_SITE_OTHER): Payer: PPO

## 2015-04-15 VITALS — BP 160/80 | HR 62 | Ht 72.0 in | Wt 169.0 lb

## 2015-04-15 DIAGNOSIS — Z0000 Encounter for general adult medical examination without abnormal findings: Secondary | ICD-10-CM

## 2015-04-15 NOTE — Patient Instructions (Addendum)
Ricky [name] , Thank you for taking time to come for your Medicare Wellness Visit. I appreciate your ongoing commitment to your health goals. Please review the following plan we discussed and let me know if I can assist you in the future.   To fup with Dr. Alain Marion regarding  1. Skin lesion; white scaly patch on left inner wrist 2. Will fup regarding BP; will check at home and bring reading as well as bring BP cuff to check accuracy BP rechecked at end of visit; dropped to 160 / 80  Will also have vision soon   These are the goals we discussed: Goals    . patient     Wants to continue current exercise regiment; eat healthy and to maintain his health       This is a list of the screening recommended for you and due dates:  Health Maintenance  Topic Date Due  . Flu Shot  04/18/2015  . Tetanus Vaccine  01/11/2020  . Colon Cancer Screening  04/01/2024  . Shingles Vaccine  Completed  . Pneumonia vaccines  Completed     Health Maintenance A healthy lifestyle and preventative care can promote health and wellness.  Maintain regular health, dental, and eye exams.  Eat a healthy diet. Foods like vegetables, fruits, whole grains, low-fat dairy products, and lean protein foods contain the nutrients you need and are low in calories. Decrease your intake of foods high in solid fats, added sugars, and salt. Get information about a proper diet from your health care provider, if necessary.  Regular physical exercise is one of the most important things you can do for your health. Most adults should get at least 150 minutes of moderate-intensity exercise (any activity that increases your heart rate and causes you to sweat) each week. In addition, most adults need muscle-strengthening exercises on 2 or more days a week.   Maintain a healthy weight. The body mass index (BMI) is a screening tool to identify possible weight problems. It provides an estimate of body fat based on height and weight.  Your health care provider can find your BMI and can help you achieve or maintain a healthy weight. For males 20 years and older:  A BMI below 18.5 is considered underweight.  A BMI of 18.5 to 24.9 is normal.  A BMI of 25 to 29.9 is considered overweight.  A BMI of 30 and above is considered obese.  Maintain normal blood lipids and cholesterol by exercising and minimizing your intake of saturated fat. Eat a balanced diet with plenty of fruits and vegetables. Blood tests for lipids and cholesterol should begin at age 2 and be repeated every 5 years. If your lipid or cholesterol levels are high, you are over age 77, or you are at high risk for heart disease, you may need your cholesterol levels checked more frequently.Ongoing high lipid and cholesterol levels should be treated with medicines if diet and exercise are not working.  If you smoke, find out from your health care provider how to quit. If you do not use tobacco, do not start.  Lung cancer screening is recommended for adults aged 62-80 years who are at high risk for developing lung cancer because of a history of smoking. A yearly low-dose CT scan of the lungs is recommended for people who have at least a 30-pack-year history of smoking and are current smokers or have quit within the past 15 years. A pack year of smoking is smoking an average of  1 pack of cigarettes a day for 1 year (for example, a 30-pack-year history of smoking could mean smoking 1 pack a day for 30 years or 2 packs a day for 15 years). Yearly screening should continue until the smoker has stopped smoking for at least 15 years. Yearly screening should be stopped for people who develop a health problem that would prevent them from having lung cancer treatment.  If you choose to drink alcohol, do not have more than 2 drinks per day. One drink is considered to be 12 oz (360 mL) of beer, 5 oz (150 mL) of wine, or 1.5 oz (45 mL) of liquor.  Avoid the use of street drugs. Do not  share needles with anyone. Ask for help if you need support or instructions about stopping the use of drugs.  High blood pressure causes heart disease and increases the risk of stroke. Blood pressure should be checked at least every 1-2 years. Ongoing high blood pressure should be treated with medicines if weight loss and exercise are not effective.  If you are 28-87 years old, ask your health care provider if you should take aspirin to prevent heart disease.  Diabetes screening involves taking a blood sample to check your fasting blood sugar level. This should be done once every 3 years after age 73 if you are at a normal weight and without risk factors for diabetes. Testing should be considered at a younger age or be carried out more frequently if you are overweight and have at least 1 risk factor for diabetes.  Colorectal cancer can be detected and often prevented. Most routine colorectal cancer screening begins at the age of 71 and continues through age 10. However, your health care provider may recommend screening at an earlier age if you have risk factors for colon cancer. On a yearly basis, your health care provider may provide home test kits to check for hidden blood in the stool. A small camera at the end of a tube may be used to directly examine the colon (sigmoidoscopy or colonoscopy) to detect the earliest forms of colorectal cancer. Talk to your health care provider about this at age 48 when routine screening begins. A direct exam of the colon should be repeated every 5-10 years through age 51, unless early forms of precancerous polyps or small growths are found.  People who are at an increased risk for hepatitis B should be screened for this virus. You are considered at high risk for hepatitis B if:  You were born in a country where hepatitis B occurs often. Talk with your health care provider about which countries are considered high risk.  Your parents were born in a high-risk country  and you have not received a shot to protect against hepatitis B (hepatitis B vaccine).  You have HIV or AIDS.  You use needles to inject street drugs.  You live with, or have sex with, someone who has hepatitis B.  You are a man who has sex with other men (MSM).  You get hemodialysis treatment.  You take certain medicines for conditions like cancer, organ transplantation, and autoimmune conditions.  Hepatitis C blood testing is recommended for all people born from [birth year] through 1963 and any individual with known risk factors for hepatitis C.  Healthy men should no longer receive prostate-specific antigen (PSA) blood tests as part of routine cancer screening. Talk to your health care provider about prostate cancer screening.  Testicular cancer screening is not recommended for adolescents or  adult males who have no symptoms. Screening includes self-exam, a health care provider exam, and other screening tests. Consult with your health care provider about any symptoms you have or any concerns you have about testicular cancer.  Practice safe sex. Use condoms and avoid high-risk sexual practices to reduce the spread of sexually transmitted infections (STIs).  You should be screened for STIs, including gonorrhea and chlamydia if:  You are sexually active and are younger than 24 years.  You are older than 24 years, and your health care provider tells you that you are at risk for this type of infection.  Your sexual activity has changed since you were last screened, and you are at an increased risk for chlamydia or gonorrhea. Ask your health care provider if you are at risk.  If you are at risk of being infected with HIV, it is recommended that you take a prescription medicine daily to prevent HIV infection. This is called pre-exposure prophylaxis (PrEP). You are considered at risk if:  You are a man who has sex with other men (MSM).  You are a heterosexual man who is sexually active with  multiple partners.  You take drugs by injection.  You are sexually active with a partner who has HIV.  Talk with your health care provider about whether you are at high risk of being infected with HIV. If you choose to begin PrEP, you should first be tested for HIV. You should then be tested every 3 months for as long as you are taking PrEP.  Use sunscreen. Apply sunscreen liberally and repeatedly throughout the day. You should seek shade when your shadow is shorter than you. Protect yourself by wearing long sleeves, pants, a wide-brimmed hat, and sunglasses year round whenever you are outdoors.  Tell your health care provider of new moles or changes in moles, especially if there is a change in shape or color. Also, tell your health care provider if a mole is larger than the size of a pencil eraser.  A one-time screening for abdominal aortic aneurysm (AAA) and surgical repair of large AAAs by ultrasound is recommended for men aged 83-75 years who are current or former smokers.  Stay current with your vaccines (immunizations). Document Released: 03/01/2008 Document Revised: 09/08/2013 Document Reviewed: 01/29/2011 [facility] Patient Information 2015 [place], [place]. This information is not intended to replace advice given to you by your health care provider. Make sure you discuss any questions you have with your health care provider.

## 2015-04-15 NOTE — Progress Notes (Addendum)
Subjective:   Ricky [name] is a 81 y.o. male who presents for Medicare Annual/Subsequent preventive examination.  Review of Systems:  HRA assessment completed during visit;  Patient is here for Annual Wellness Assessment Ricky [name], Ricky [name] The Patient was informed that this wellness visit is to identify risk and educate on how to reduce risk for increase disease through lifestyle changes.    Personalized education given regarding risk on the problem list that are currently being medically managed;  Lifestyle changes reviewed in lieu of CHF; HTN:  Does have small area of concern in left inner wrist; scaly white and will have Dr. Alain [name] look at it when he [name] in August  BMI normal  Diet: likes fish, tilapia; flounder; chicken breast; eats apples and blueberries; strawberries and peaches Likes all kinds vegetables/ makes up raw vegetables salad with thousand island dressing Likes the melons;   Exercise: Rides stationary bike; 2 to 3 minutes; walks some every day(walks 5 to 15 minutes)  Squats and some weights  Exercises 45 min per day Love sports and watches baseball and golf ; football on TV.  Have 3 children who live in the neighborhood and he sees them everyday 7 grandchildren. One of his son's has body shop on property   SAFETY Generalized Safety in the home reviewed/   Fall prevention;  Does his own house keeping; help going grocery shopping  Single family home; live on 3 acre / someone does mow for him Good neighbors;   Was paratroopers in WW[employer] Go to the [place] x 2 every year; just in case he needs more help than Medicare will pay for  Has long term care policy AD completed; no changes necessary  Sleeps well  Urinary or fecal incontinence reviewed; no issue voiced Assess fear of falling ___ has fallen but not in the last year;  Educated on prevention falls;  Exercise, toning and strengthening; Balance exercises / states he tries not to sit more than an hour    Wear Comfortable shoes; Regular vision checks generally but is due; wearing dark glasses today for exam as his eyes are light sensitive  Home safety; removal of throw rugs; bathroom handrails; Unlevel surfaces outside;  Home Safety Tips for Older Adults given Driving;   Review Firearm safety as appropriate;  Smoke detectors; yes Assessed for community safety; Emergency Plan for illness or other/ has a long term care policy Driving: seatbelt Sun protection   Screenings overdue:  Ophthalmology exam; Vision works; Need to make apt this year Cataract 1990 approx; had laser work done since Immunizations up to date Tetanus due in April 2021 Shingles completed in April 2008 Colonoscopy; Colo-guard 12/2013 and this was negative/ had colonoscopy anyway; had polys removed EKG 11/23/2014 Hearing: [id number]  Dental: go x 2 q year; has implants; bridges or caps/ can eat anything he wants too Dermatology; Tx at [facility] but  Current Care Team reviewed and updated Dr. Caryl [name], Ricky [name] Dr. Watt [name] GI    Cardiac Risk Factors include: advanced age (>27men, >22 women);family history of premature cardiovascular disease;male gender     Objective:    Vitals: BP 160/80 mmHg  Pulse 62  Ht 6' (1.829 m)  Wt 169 lb (76.658 kg)  BMI 22.92 kg/m2  SpO2 93%  Tobacco History  Smoking status  . Former Smoker -- 1.00 packs/day for 30 years  . Types: Cigarettes  . Quit date: 05/01/1979  Smokeless tobacco  . Not on file     Counseling given: Yes   Past  Medical History  Diagnosis Date  . HYPOTHYROIDISM 09/04/2007  . ANXIETY 04/26/2008  . Essential hypertension, benign 10/03/2007  . LYMPHADENOPATHY 04/06/2009  . TOBACCO USE, QUIT 07/14/2009  . Atrial fibrillation 10/31/2010    Coumadin therapy;  Echo 6/12: EF 50-55%, moderate MR, moderate TR, PASP 52, mild LAE  . Pacemaker     Status post AV nodal ablation  . Nocturia   . BPH (benign prostatic hypertrophy)   . HTN (hypertension) 06/07/2013   Past  Surgical History  Procedure Laterality Date  . Pacemaker insertion  10/16/05  . Colonoscopy    . Eye surgery  2000    eye muscle release  . Eye surgery      both cataracts  . Inguinal hernia repair Right 05/06/2013    Procedure: HERNIA REPAIR INGUINAL ADULT;  Surgeon: Ricky Lasso, MD;  Location: [facility];  Service: General;  Laterality: Right;  . Hernia repair     Family History  Problem Relation Age of Onset  . Hypertension Other   . Hypertension Mother   . Cancer Mother     uterine/cervical  . Heart disease Father    History  Sexual Activity  . Sexual Activity: No    Outpatient Encounter Prescriptions as of 04/15/2015  Medication Sig  . apixaban (ELIQUIS) 5 MG TABS tablet Take 1 tablet (5 mg total) by mouth 2 (two) times daily.  . Ascorbic Acid (VITAMIN C PO) Take 1 tablet by mouth daily.  . Cholecalciferol 1000 UNITS tablet Take 1,000 Units by mouth daily.    . diphenhydrAMINE (BENADRYL) 25 mg capsule Take 1-2 capsules (25-50 mg total) by mouth every 4 (four) hours as needed for itching or allergies (1-2 for bee stings).  . EPINEPHrine (EPIPEN 2-PAK) 0.3 mg/0.3 mL DEVI Inject 0.3 mg into the muscle once as needed. For allergic reaction  . irbesartan (AVAPRO) 300 MG tablet TAKE ONE TABLET BY MOUTH AT BEDTIME  . levothyroxine (SYNTHROID, LEVOTHROID) 75 MCG tablet Take 75 mcg by mouth daily.  . Multiple Vitamin (MULTIVITAMIN) tablet Take 1 tablet by mouth daily.    Marland Kitchen torsemide (DEMADEX) 10 MG tablet Take 1-2 tablets (10-20 mg total) by mouth once. Take 20 mg in the AM, 10 MG PM  . carvedilol (COREG) 25 MG tablet Take 1 tablet (25 mg total) by mouth 2 (two) times daily.   No facility-administered encounter medications on file as of 04/15/2015.    Activities of Daily Living In your present state of health, do you have any difficulty performing the following activities: 04/15/2015  Hearing? N  Vision? N  Difficulty concentrating or making decisions? N    Walking or climbing stairs? N  Dressing or bathing? N  Doing errands, shopping? N  Preparing Food and eating ? N  Using the Toilet? N  In the past six months, have you accidently leaked urine? N  Do you have problems with loss of bowel control? N  Managing your Medications? N  Managing your Finances? N  Housekeeping or managing your Housekeeping? N    Patient Care Team: Cassandria Anger, MD as PCP - General Deboraha Sprang, MD (Cardiology) Clarene Essex, MD as Consulting Physician (Gastroenterology)   Assessment:    Objective:   The goal of the wellness visit is to assist the patient how to close the gaps in care and create a preventative care plan for the patient.  Personalized Education was given regarding: to maintain health  Pt determined a personalized goal; see patient  goals;  Assessment included: smoking (secondary) educated as appropriate; including LDCT if as ppropriate   Calcium and Vit D as appropriate/ Osteoporosis risk reviewed  Taking meds without issues; no barriers identified  Labs were and fup visit noted with MD if labs are due to be re-drawn.  Stress: Recommendations for managing stress if assessed as a factor;   No Risk for hepatitis or high risk social behavior identified via hepatitis screen  Educated on shingles completed Cognition assessed by AD8; Score 0 MMSE deferred as the patient stated they had no memory issues; No identified risk were noted; The patient was oriented x 3; appropriate in dress and manner and no objective failures at ADL's or IADL's.   Depression screen negative;   Functional status reviewed; no losses in function x 1 year  Bowel and bladder issues assessed  End of life planning was discussed; aging in home or other; plans to complete HCPOA were discussed if not completed Advanced Directive; Has HCPOA on file    Exercise Activities and Dietary recommendations Current Exercise Habits:: Home exercise routine (exercises  at home for 5 to 15 minutes at a time), Time (Minutes): 50, Frequency (Times/Week): 5, Weekly Exercise (Minutes/Week): 250, Intensity: Mild  Goals    . patient     Wants to continue current exercise regiment; eat healthy and to maintain his health      Fall Risk Fall Risk  04/15/2015 12/23/2014  Falls in the past year? No No   Depression Screen PHQ 2/9 Scores 04/15/2015 12/23/2014  PHQ - 2 Score 0 0    Cognitive Testing MMSE - Mini Mental State Exam 04/15/2015  Not completed: Unable to complete  AD8 Score 0  Immunization History  Administered Date(s) Administered  . Influenza Split 07/03/2011, 06/25/2012  . Influenza Whole 05/31/2010  . Influenza,inj,Quad PF,36+ Mos 05/28/2013, 05/04/2014  . Pneumococcal Conjugate-13 12/30/2013  . Pneumococcal Polysaccharide-23 06/19/2006  . Td 01/10/2010  . Zoster 02/12/2007   Screening Tests Health Maintenance  Topic Date Due  . INFLUENZA VACCINE  04/18/2015  . TETANUS/TDAP  01/11/2020  . COLONOSCOPY  04/01/2024  . ZOSTAVAX  Completed  . PNA vac Low Risk Adult  Completed      Plan:      1.To fup with Dr. Alain [name]  regarding scaly patch on left wrist at August visit 2. Will fup on BP  BP high this visit; but states it goes up when he goes to the doctor's office States BP this 126/72 yesterday; generally runs <130;  will continue to check BP prior to his august visit and will bring readings in. Also will bring cuff to check for accuracy (190 / 90 on admission but was 160/80 when rechecked)   States [facility] and he is taking it as ordered.  During the course of the visit the patient was educated and counseled about the following appropriate screening and preventive services:   Vaccines to include Pneumoccal, Influenza, Hepatitis B, Td, Zostavax, HCV/up to date  Electrocardiogram; march of 2016  Cardiovascular Disease / no c/o of discomfort  Colorectal cancer screening/ colonoscopy neg  Diabetes  screening/ deferred  Prostate Cancer Screening/ deferred   Glaucoma screening/ needs to make an apt for eye exam  Nutrition counseling   Smoking cessation counseling  Patient Instructions (the written plan) was given to the patient.    Wynetta Fines, RN  04/15/2015   Medical screening examination/treatment/procedure(s) were performed by non-physician practitioner and as supervising physician I was immediately available  for consultation/collaboration. I agree with above. Walker Kehr, MD

## 2015-04-25 ENCOUNTER — Encounter: Payer: Self-pay | Admitting: Internal Medicine

## 2015-04-25 ENCOUNTER — Ambulatory Visit (INDEPENDENT_AMBULATORY_CARE_PROVIDER_SITE_OTHER): Payer: PPO | Admitting: Internal Medicine

## 2015-04-25 VITALS — BP 148/84 | HR 65 | Wt 169.0 lb

## 2015-04-25 DIAGNOSIS — L570 Actinic keratosis: Secondary | ICD-10-CM

## 2015-04-25 DIAGNOSIS — I4891 Unspecified atrial fibrillation: Secondary | ICD-10-CM

## 2015-04-25 DIAGNOSIS — I5020 Unspecified systolic (congestive) heart failure: Secondary | ICD-10-CM

## 2015-04-25 NOTE — Progress Notes (Addendum)
Subjective:  Patient ID: Ricky [name], male    DOB: 11-13-1922  Age: 81 y.o. MRN: [id number]  CC: No chief complaint on file.   HPI CHESNEY KLIMASZEWSKI presents for CAD, HTN, CHF f/u. C/o weak legs - not new: has to walk w/a cane  Outpatient Prescriptions Prior to Visit  Medication Sig Dispense Refill  . apixaban (ELIQUIS) 5 MG TABS tablet Take 1 tablet (5 mg total) by mouth 2 (two) times daily. 180 tablet 3  . Ascorbic Acid (VITAMIN C PO) Take 1 tablet by mouth daily.    . Cholecalciferol 1000 UNITS tablet Take 1,000 Units by mouth daily.      . diphenhydrAMINE (BENADRYL) 25 mg capsule Take 1-2 capsules (25-50 mg total) by mouth every 4 (four) hours as needed for itching or allergies (1-2 for bee stings). 60 capsule 2  . EPINEPHrine (EPIPEN 2-PAK) 0.3 mg/0.3 mL DEVI Inject 0.3 mg into the muscle once as needed. For allergic reaction    . irbesartan (AVAPRO) 300 MG tablet TAKE ONE TABLET BY MOUTH AT BEDTIME 90 tablet 3  . levothyroxine (SYNTHROID, LEVOTHROID) 75 MCG tablet Take 75 mcg by mouth daily.    . Multiple Vitamin (MULTIVITAMIN) tablet Take 1 tablet by mouth daily.      Marland Kitchen torsemide (DEMADEX) 10 MG tablet Take 1-2 tablets (10-20 mg total) by mouth once. Take 20 mg in the AM, 10 MG PM 180 tablet 3  . carvedilol (COREG) 25 MG tablet Take 1 tablet (25 mg total) by mouth 2 (two) times daily. 180 tablet 3   No facility-administered medications prior to visit.    ROS Review of Systems  Constitutional: Negative for appetite change, fatigue and unexpected weight change.  HENT: Negative for congestion, nosebleeds, sneezing, sore throat and trouble swallowing.   Eyes: Negative for itching and visual disturbance.  Respiratory: Negative for cough and wheezing.   Cardiovascular: Negative for chest pain, palpitations and leg swelling.  Gastrointestinal: Negative for nausea, diarrhea, blood in stool and abdominal distention.  Genitourinary: Negative for frequency and hematuria.    Musculoskeletal: Positive for gait problem. Negative for back pain, joint swelling and neck pain.  Skin: Negative for rash.  Neurological: Negative for dizziness, tremors, speech difficulty and weakness.  Psychiatric/Behavioral: Negative for sleep disturbance, dysphoric mood and agitation. The patient is not nervous/anxious.     Objective:  BP 148/84 mmHg  Pulse 65  Wt 169 lb (76.658 kg)  SpO2 95%  BP Readings from Last 3 Encounters:  04/25/15 148/84  04/15/15 160/80  12/23/14 130/70    Wt Readings from Last 3 Encounters:  04/25/15 169 lb (76.658 kg)  04/15/15 169 lb (76.658 kg)  12/23/14 171 lb (77.565 kg)    Physical Exam  Constitutional: He is oriented to person, place, and time. He appears well-developed. No distress.  NAD  HENT:  Mouth/Throat: Oropharynx is clear and moist.  Eyes: Conjunctivae are normal. Pupils are equal, round, and reactive to light.  Neck: Normal range of motion. No JVD present. No thyromegaly present.  Cardiovascular: Normal rate, regular rhythm, normal heart sounds and intact distal pulses.  Exam reveals no gallop and no friction rub.   No murmur heard. Pulmonary/Chest: Effort normal and breath sounds normal. No respiratory distress. He has no wheezes. He has no rales. He exhibits no tenderness.  Abdominal: Soft. Bowel sounds are normal. He exhibits no distension and no mass. There is no tenderness. There is no rebound and no guarding.  Musculoskeletal: Normal range of motion.  He exhibits no edema or tenderness.  Lymphadenopathy:    He has no cervical adenopathy.  Neurological: He is alert and oriented to person, place, and time. He has normal reflexes. No cranial nerve deficit. He exhibits normal muscle tone. He displays a negative Romberg sign. Coordination abnormal. Gait normal.  Skin: Skin is warm and dry. No rash noted.  Psychiatric: He has a normal mood and affect. His behavior is normal. Judgment and thought content normal.   Cane  AKs    Procedure Note :     Procedure : Cryosurgery   Indication: Actinic keratosis(es)   Risks including unsuccessful procedure , bleeding, infection, bruising, scar, a need for a repeat  procedure and others were explained to the patient in detail as well as the benefits. Informed consent was obtained verbally.    3 lesion(s)  on  Hands, face  was/were treated with liquid nitrogen on a Q-tip in a usual fasion . Band-Aid was applied and antibiotic ointment was given for a later use.   Tolerated well. Complications none.   Postprocedure instructions :     Keep the wounds clean. You can wash them with liquid soap and water. Pat dry with gauze or a Kleenex tissue  Before applying antibiotic ointment and a Band-Aid.   You need to report immediately  if  any signs of infection develop.     Lab Results  Component Value Date   WBC 6.6 12/23/2014   HGB 13.1 12/23/2014   HCT 38.5* 12/23/2014   PLT 236.0 12/23/2014   GLUCOSE 95 12/23/2014   CHOL 210* 12/28/2013   TRIG 59.0 12/28/2013   HDL 63.40 12/28/2013   LDLCALC 135* 12/28/2013   ALT 17 09/03/2014   AST 21 09/03/2014   NA 138 12/23/2014   K 4.6 12/23/2014   CL 101 12/23/2014   CREATININE 1.05 12/23/2014   BUN 22 12/23/2014   CO2 31 12/23/2014   TSH 1.14 12/23/2014   PSA 0.84 12/28/2013   INR 2.8 12/16/2013    Dg Elbow Complete Left  09/01/2013   CLINICAL DATA:  Fall, elbow pain  EXAM: LEFT ELBOW - COMPLETE 3+ VIEW  COMPARISON:  None.  FINDINGS: Four views of left elbow submitted. No acute fracture or subluxation. No posterior fat pad sign.  IMPRESSION: No acute fracture or subluxation.  No posterior fat pad sign.   Electronically Signed   By: Lahoma Crocker M.D.   On: 09/01/2013 11:27   Dg Hip Complete Left  09/01/2013   CLINICAL DATA:  81 year old male status post fall with left hip pain. Initial encounter.  EXAM: LEFT HIP - COMPLETE 2+ VIEW  COMPARISON:  06/05/2013.  FINDINGS: Left femoral heads remain normally  located. Stable hip joint spaces. Pelvis appears stable and intact. SI joints within normal limits. Multilevel advanced disc and endplate degeneration in the lumbar spine.  Proximal left femur appears stable and intact. Left lower extremity calcified atherosclerosis re- identified.  IMPRESSION: No acute fracture or dislocation identified about the left hip or pelvis.  If occult hip fracture is suspected or if the patient is unable to weightbear, MRI is the preferred modality for further evaluation.   Electronically Signed   By: Lars Pinks M.D.   On: 09/01/2013 11:29    Assessment & Plan:   Diagnoses and all orders for this visit:  Systolic congestive heart failure, unspecified congestive heart failure chronicity  Atrial fibrillation, unspecified  Actinic keratoses  I am having Mr. Moree maintain his EPINEPHrine, multivitamin, Cholecalciferol,  levothyroxine, carvedilol, apixaban, Ascorbic Acid (VITAMIN C PO), irbesartan, torsemide, and diphenhydrAMINE.  No orders of the defined types were placed in this encounter.     Follow-up: Return in about 4 months (around 08/25/2015) for Wellness Exam.  Walker Kehr, MD

## 2015-04-25 NOTE — Assessment & Plan Note (Signed)
See cryo x3

## 2015-04-25 NOTE — Patient Instructions (Signed)
   Postprocedure instructions :     Keep the wounds clean. You can wash them with liquid soap and water. Pat dry with gauze or a Kleenex tissue  Before applying antibiotic ointment and a Band-Aid.   You need to report immediately  if  any signs of infection develop.    

## 2015-04-25 NOTE — Progress Notes (Signed)
Pre visit review using our clinic review tool, if applicable. No additional management support is needed unless otherwise documented below in the visit note. 

## 2015-04-25 NOTE — Assessment & Plan Note (Signed)
Coreg, Demadex, Eliquis, Losartan 

## 2015-04-25 NOTE — Assessment & Plan Note (Signed)
Doing well In S brady now

## 2015-05-25 ENCOUNTER — Ambulatory Visit (INDEPENDENT_AMBULATORY_CARE_PROVIDER_SITE_OTHER): Payer: PPO | Admitting: *Deleted

## 2015-05-25 DIAGNOSIS — I442 Atrioventricular block, complete: Secondary | ICD-10-CM | POA: Diagnosis not present

## 2015-05-25 LAB — CUP PACEART INCLINIC DEVICE CHECK
Battery Impedance: 5100 Ohm
Brady Statistic RV Percent Paced: 99 %
Date Time Interrogation Session: 20160907163129
Lead Channel Pacing Threshold Amplitude: 0.5 V
Lead Channel Setting Pacing Pulse Width: 0.8 ms
Lead Channel Setting Sensing Sensitivity: 4 mV
MDC IDC MSMT BATTERY REMAINING LONGEVITY: 33 mo
MDC IDC MSMT BATTERY VOLTAGE: 2.72 V
MDC IDC MSMT LEADCHNL RV IMPEDANCE VALUE: 401 Ohm
MDC IDC MSMT LEADCHNL RV PACING THRESHOLD PULSEWIDTH: 0.8 ms
Pulse Gen Model: 5816
Pulse Gen Serial Number: 1647680

## 2015-05-25 NOTE — Progress Notes (Signed)
Pacemaker check in clinic. Normal device function. Threshold, impedance consistent with previous measurements. Device programmed to maximize longevity. No high ventricular rates noted. Device programmed at appropriate safety margins. Histogram distribution appropriate for patient activity level. Device programmed to optimize intrinsic conduction. Estimated longevity 2.75 years. ROV with SK in March.

## 2015-06-11 ENCOUNTER — Encounter: Payer: Self-pay | Admitting: Internal Medicine

## 2015-06-21 ENCOUNTER — Encounter: Payer: Self-pay | Admitting: Internal Medicine

## 2015-08-26 ENCOUNTER — Other Ambulatory Visit (INDEPENDENT_AMBULATORY_CARE_PROVIDER_SITE_OTHER): Payer: PPO

## 2015-08-26 ENCOUNTER — Encounter: Payer: Self-pay | Admitting: Internal Medicine

## 2015-08-26 ENCOUNTER — Ambulatory Visit (INDEPENDENT_AMBULATORY_CARE_PROVIDER_SITE_OTHER): Payer: PPO | Admitting: Internal Medicine

## 2015-08-26 VITALS — BP 142/80 | HR 65 | Wt 173.0 lb

## 2015-08-26 DIAGNOSIS — I10 Essential (primary) hypertension: Secondary | ICD-10-CM

## 2015-08-26 DIAGNOSIS — I5020 Unspecified systolic (congestive) heart failure: Secondary | ICD-10-CM | POA: Diagnosis not present

## 2015-08-26 DIAGNOSIS — I4891 Unspecified atrial fibrillation: Secondary | ICD-10-CM

## 2015-08-26 DIAGNOSIS — M25561 Pain in right knee: Secondary | ICD-10-CM | POA: Diagnosis not present

## 2015-08-26 LAB — CBC WITH DIFFERENTIAL/PLATELET
Basophils Absolute: 0 10*3/uL (ref 0.0–0.1)
Basophils Relative: 0.5 % (ref 0.0–3.0)
EOS ABS: 0.2 10*3/uL (ref 0.0–0.7)
EOS PCT: 2.2 % (ref 0.0–5.0)
HCT: 41.5 % (ref 39.0–52.0)
HEMOGLOBIN: 13.7 g/dL (ref 13.0–17.0)
Lymphocytes Relative: 29.7 % (ref 12.0–46.0)
Lymphs Abs: 2.4 10*3/uL (ref 0.7–4.0)
MCHC: 33.1 g/dL (ref 30.0–36.0)
MCV: 100.9 fl — ABNORMAL HIGH (ref 78.0–100.0)
MONO ABS: 0.7 10*3/uL (ref 0.1–1.0)
Monocytes Relative: 8.2 % (ref 3.0–12.0)
Neutro Abs: 4.8 10*3/uL (ref 1.4–7.7)
Neutrophils Relative %: 59.4 % (ref 43.0–77.0)
Platelets: 248 10*3/uL (ref 150.0–400.0)
RBC: 4.11 Mil/uL — AB (ref 4.22–5.81)
RDW: 13.9 % (ref 11.5–15.5)
WBC: 8.1 10*3/uL (ref 4.0–10.5)

## 2015-08-26 LAB — HEPATIC FUNCTION PANEL
ALBUMIN: 4 g/dL (ref 3.5–5.2)
ALK PHOS: 81 U/L (ref 39–117)
ALT: 12 U/L (ref 0–53)
AST: 20 U/L (ref 0–37)
BILIRUBIN TOTAL: 0.6 mg/dL (ref 0.2–1.2)
Bilirubin, Direct: 0.1 mg/dL (ref 0.0–0.3)
TOTAL PROTEIN: 6.8 g/dL (ref 6.0–8.3)

## 2015-08-26 LAB — BASIC METABOLIC PANEL
BUN: 28 mg/dL — ABNORMAL HIGH (ref 6–23)
CALCIUM: 9.2 mg/dL (ref 8.4–10.5)
CHLORIDE: 99 meq/L (ref 96–112)
CO2: 33 mEq/L — ABNORMAL HIGH (ref 19–32)
CREATININE: 1 mg/dL (ref 0.40–1.50)
GFR: 74.6 mL/min (ref >60.00)
Glucose, Bld: 95 mg/dL (ref 70–99)
Potassium: 5.1 mEq/L (ref 3.5–5.1)
SODIUM: 138 meq/L (ref 135–145)

## 2015-08-26 LAB — TSH: TSH: 1.46 u[IU]/mL (ref 0.35–4.50)

## 2015-08-26 MED ORDER — TORSEMIDE 10 MG PO TABS: 10.0000 mg | ORAL_TABLET | Freq: Once | ORAL | Status: DC

## 2015-08-26 NOTE — Assessment & Plan Note (Signed)
Chronic OA Ortho f/u

## 2015-08-26 NOTE — Progress Notes (Signed)
Pre visit review using our clinic review tool, if applicable. No additional management support is needed unless otherwise documented below in the visit note. 

## 2015-08-26 NOTE — Progress Notes (Signed)
Subjective:  Patient ID: Ricky [name], male    DOB: Jun 21, 1923  Age: 81 y.o. MRN: [id number]  CC: No chief complaint on file.   HPI TREYSEN OTTUM presents for HTN, CHF, HTN, hypothyroidism f/u  Outpatient Prescriptions Prior to Visit  Medication Sig Dispense Refill  . apixaban (ELIQUIS) 5 MG TABS tablet Take 1 tablet (5 mg total) by mouth 2 (two) times daily. 180 tablet 3  . Ascorbic Acid (VITAMIN C PO) Take 1 tablet by mouth daily.    . Cholecalciferol 1000 UNITS tablet Take 1,000 Units by mouth daily.      . diphenhydrAMINE (BENADRYL) 25 mg capsule Take 1-2 capsules (25-50 mg total) by mouth every 4 (four) hours as needed for itching or allergies (1-2 for bee stings). 60 capsule 2  . EPINEPHrine (EPIPEN 2-PAK) 0.3 mg/0.3 mL DEVI Inject 0.3 mg into the muscle once as needed. For allergic reaction    . irbesartan (AVAPRO) 300 MG tablet TAKE ONE TABLET BY MOUTH AT BEDTIME 90 tablet 3  . levothyroxine (SYNTHROID, LEVOTHROID) 75 MCG tablet Take 75 mcg by mouth daily.    . Multiple Vitamin (MULTIVITAMIN) tablet Take 1 tablet by mouth daily.      Marland Kitchen torsemide (DEMADEX) 10 MG tablet Take 1-2 tablets (10-20 mg total) by mouth once. Take 20 mg in the AM, 10 MG PM 180 tablet 3  . carvedilol (COREG) 25 MG tablet Take 1 tablet (25 mg total) by mouth 2 (two) times daily. 180 tablet 3   No facility-administered medications prior to visit.    ROS Review of Systems  Constitutional: Negative for appetite change, fatigue and unexpected weight change.  HENT: Negative for congestion, nosebleeds, sneezing, sore throat and trouble swallowing.   Eyes: Negative for itching and visual disturbance.  Respiratory: Negative for cough.   Cardiovascular: Negative for chest pain, palpitations and leg swelling.  Gastrointestinal: Negative for nausea, diarrhea, blood in stool and abdominal distention.  Genitourinary: Negative for frequency and hematuria.  Musculoskeletal: Positive for arthralgias and gait  problem. Negative for back pain, joint swelling and neck pain.  Skin: Negative for rash.  Neurological: Negative for dizziness, tremors, speech difficulty and weakness.  Psychiatric/Behavioral: Negative for sleep disturbance, dysphoric mood and agitation. The patient is not nervous/anxious.     Objective:  BP 142/80 mmHg  Pulse 65  Wt 173 lb (78.472 kg)  SpO2 97%  BP Readings from Last 3 Encounters:  08/26/15 142/80  04/25/15 148/84  04/15/15 160/80    Wt Readings from Last 3 Encounters:  08/26/15 173 lb (78.472 kg)  04/25/15 169 lb (76.658 kg)  04/15/15 169 lb (76.658 kg)    Physical Exam  Constitutional: He is oriented to person, place, and time. He appears well-developed. No distress.  NAD  HENT:  Mouth/Throat: Oropharynx is clear and moist.  Eyes: Conjunctivae are normal. Pupils are equal, round, and reactive to light.  Neck: Normal range of motion. No JVD present. No thyromegaly present.  Cardiovascular: Normal rate, regular rhythm, normal heart sounds and intact distal pulses.  Exam reveals no gallop and no friction rub.   No murmur heard. Pulmonary/Chest: Effort normal and breath sounds normal. No respiratory distress. He has no wheezes. He has no rales. He exhibits no tenderness.  Abdominal: Soft. Bowel sounds are normal. He exhibits no distension and no mass. There is no tenderness. There is no rebound and no guarding.  Musculoskeletal: Normal range of motion. He exhibits tenderness. He exhibits no edema.  Lymphadenopathy:  He has no cervical adenopathy.  Neurological: He is alert and oriented to person, place, and time. He has normal reflexes. No cranial nerve deficit. He exhibits normal muscle tone. He displays a negative Romberg sign. Coordination and gait normal.  Skin: Skin is warm and dry. No rash noted.  Psychiatric: He has a normal mood and affect. His behavior is normal. Judgment and thought content normal.  Cane R knee w/pain  Lab Results  Component  Value Date   WBC 6.6 12/23/2014   HGB 13.1 12/23/2014   HCT 38.5* 12/23/2014   PLT 236.0 12/23/2014   GLUCOSE 95 12/23/2014   CHOL 210* 12/28/2013   TRIG 59.0 12/28/2013   HDL 63.40 12/28/2013   LDLCALC 135* 12/28/2013   ALT 17 09/03/2014   AST 21 09/03/2014   NA 138 12/23/2014   K 4.6 12/23/2014   CL 101 12/23/2014   CREATININE 1.05 12/23/2014   BUN 22 12/23/2014   CO2 31 12/23/2014   TSH 1.14 12/23/2014   PSA 0.84 12/28/2013   INR 2.8 12/16/2013    Dg Elbow Complete Left  09/01/2013  CLINICAL DATA:  Fall, elbow pain EXAM: LEFT ELBOW - COMPLETE 3+ VIEW COMPARISON:  None. FINDINGS: Four views of left elbow submitted. No acute fracture or subluxation. No posterior fat pad sign. IMPRESSION: No acute fracture or subluxation.  No posterior fat pad sign. Electronically Signed   By: Lahoma Crocker M.D.   On: 09/01/2013 11:27   Dg Hip Complete Left  09/01/2013  CLINICAL DATA:  81 year old male status post fall with left hip pain. Initial encounter. EXAM: LEFT HIP - COMPLETE 2+ VIEW COMPARISON:  06/05/2013. FINDINGS: Left femoral heads remain normally located. Stable hip joint spaces. Pelvis appears stable and intact. SI joints within normal limits. Multilevel advanced disc and endplate degeneration in the lumbar spine. Proximal left femur appears stable and intact. Left lower extremity calcified atherosclerosis re- identified. IMPRESSION: No acute fracture or dislocation identified about the left hip or pelvis. If occult hip fracture is suspected or if the patient is unable to weightbear, MRI is the preferred modality for further evaluation. Electronically Signed   By: Lars Pinks M.D.   On: 09/01/2013 11:29    Assessment & Plan:   Diagnoses and all orders for this visit:  Atrial fibrillation, unspecified type ([place])  Systolic congestive heart failure, unspecified congestive heart failure chronicity ([place])  Essential hypertension  Knee pain, right  Other orders -     torsemide (DEMADEX)  10 MG tablet; Take 1-2 tablets (10-20 mg total) by mouth once. Take 20 mg in the AM, 10 MG PM  I am having Mr. Gordin maintain his EPINEPHrine, multivitamin, Cholecalciferol, levothyroxine, carvedilol, apixaban, Ascorbic Acid (VITAMIN C PO), irbesartan, torsemide, and diphenhydrAMINE.  No orders of the defined types were placed in this encounter.     Follow-up: Return in about 3 months (around 11/24/2015) for a follow-up visit.  Walker Kehr, MD

## 2015-08-26 NOTE — Assessment & Plan Note (Signed)
  Coreg, Demadex, Eliquis, Losartan 

## 2015-08-26 NOTE — Assessment & Plan Note (Signed)
Coreg, Demadex, Losartan 

## 2015-08-26 NOTE — Assessment & Plan Note (Signed)
Rate controlled On Eliquis, Coreg

## 2015-08-30 ENCOUNTER — Telehealth: Payer: Self-pay | Admitting: *Deleted

## 2015-08-30 ENCOUNTER — Encounter: Payer: Self-pay | Admitting: Internal Medicine

## 2015-08-30 NOTE — Telephone Encounter (Signed)
Left msg on triage stating pharmacy will not fill the torsemide that md sent last week. He states pharmacy said they have reach out to md haven't receive call back. Called [facility] they were needing to verify the directions on torsemide. Gave md instructions on the torsemide. Notified pt he can pick rx up today...Ricky [name]

## 2015-09-14 ENCOUNTER — Emergency Department (HOSPITAL_COMMUNITY): Payer: PPO

## 2015-09-14 ENCOUNTER — Emergency Department (HOSPITAL_COMMUNITY)
Admission: EM | Admit: 2015-09-14 | Discharge: 2015-09-14 | Disposition: A | Discharge status: Home / Self Care | Payer: PPO | Attending: Emergency Medicine | Admitting: Emergency Medicine

## 2015-09-14 ENCOUNTER — Encounter (HOSPITAL_COMMUNITY): Payer: Self-pay | Admitting: *Deleted

## 2015-09-14 DIAGNOSIS — Z880 Allergy status to penicillin: Secondary | ICD-10-CM | POA: Insufficient documentation

## 2015-09-14 DIAGNOSIS — E039 Hypothyroidism, unspecified: Secondary | ICD-10-CM | POA: Diagnosis not present

## 2015-09-14 DIAGNOSIS — I10 Essential (primary) hypertension: Secondary | ICD-10-CM | POA: Insufficient documentation

## 2015-09-14 DIAGNOSIS — I4891 Unspecified atrial fibrillation: Secondary | ICD-10-CM | POA: Insufficient documentation

## 2015-09-14 DIAGNOSIS — R0602 Shortness of breath: Secondary | ICD-10-CM | POA: Diagnosis present

## 2015-09-14 DIAGNOSIS — J40 Bronchitis, not specified as acute or chronic: Secondary | ICD-10-CM

## 2015-09-14 DIAGNOSIS — Z8659 Personal history of other mental and behavioral disorders: Secondary | ICD-10-CM | POA: Insufficient documentation

## 2015-09-14 DIAGNOSIS — Z7902 Long term (current) use of antithrombotics/antiplatelets: Secondary | ICD-10-CM | POA: Diagnosis not present

## 2015-09-14 DIAGNOSIS — Z79899 Other long term (current) drug therapy: Secondary | ICD-10-CM | POA: Insufficient documentation

## 2015-09-14 DIAGNOSIS — J209 Acute bronchitis, unspecified: Secondary | ICD-10-CM | POA: Diagnosis not present

## 2015-09-14 DIAGNOSIS — Z87891 Personal history of nicotine dependence: Secondary | ICD-10-CM | POA: Diagnosis not present

## 2015-09-14 DIAGNOSIS — Z87438 Personal history of other diseases of male genital organs: Secondary | ICD-10-CM | POA: Insufficient documentation

## 2015-09-14 DIAGNOSIS — Z950 Presence of cardiac pacemaker: Secondary | ICD-10-CM | POA: Insufficient documentation

## 2015-09-14 LAB — CBC
HCT: 40.4 % (ref 39.0–52.0)
HEMOGLOBIN: 13 g/dL (ref 13.0–17.0)
MCH: 33 pg (ref 26.0–34.0)
MCHC: 32.2 g/dL (ref 30.0–36.0)
MCV: 102.5 fL — AB (ref 78.0–100.0)
PLATELETS: 219 10*3/uL (ref 150–400)
RBC: 3.94 MIL/uL — AB (ref 4.22–5.81)
RDW: 13.3 % (ref 11.5–15.5)
WBC: 9.6 10*3/uL (ref 4.0–10.5)

## 2015-09-14 LAB — BASIC METABOLIC PANEL
Anion gap: 11 (ref 5–15)
BUN: 26 mg/dL — ABNORMAL HIGH (ref 6–20)
CALCIUM: 9.1 mg/dL (ref 8.9–10.3)
CHLORIDE: 102 mmol/L (ref 101–111)
CO2: 28 mmol/L (ref 22–32)
CREATININE: 1.02 mg/dL (ref 0.61–1.24)
GFR calc non Af Amer: >60 mL/min (ref >60)
Glucose, Bld: 113 mg/dL — ABNORMAL HIGH (ref 65–99)
Potassium: 4.2 mmol/L (ref 3.5–5.1)
SODIUM: 141 mmol/L (ref 135–145)

## 2015-09-14 LAB — BRAIN NATRIURETIC PEPTIDE: B NATRIURETIC PEPTIDE 5: 131.9 pg/mL — AB (ref 0.0–100.0)

## 2015-09-14 LAB — I-STAT TROPONIN, ED: TROPONIN I, POC: 0.02 ng/mL (ref 0.00–0.08)

## 2015-09-14 LAB — PROTIME-INR
INR: 1.33 (ref 0.00–1.49)
PROTHROMBIN TIME: 16.6 s — AB (ref 11.6–15.2)

## 2015-09-14 MED ORDER — LEVOFLOXACIN 500 MG PO TABS: 500.0000 mg | ORAL_TABLET | Freq: Every day | ORAL | Status: DC

## 2015-09-14 MED ORDER — ALBUTEROL SULFATE HFA 108 (90 BASE) MCG/ACT IN AERS
2.0000 | INHALATION_SPRAY | RESPIRATORY_TRACT | Status: DC
Start: 2015-09-14 — End: 2015-09-14
  Administered 2015-09-14: 2 via RESPIRATORY_TRACT
  Filled 2015-09-14: qty 6.7

## 2015-09-14 MED ORDER — ALBUTEROL SULFATE (2.5 MG/3ML) 0.083% IN NEBU
5.0000 mg | INHALATION_SOLUTION | Freq: Once | RESPIRATORY_TRACT | Status: AC
  Administered 2015-09-14: 5 mg via RESPIRATORY_TRACT
  Filled 2015-09-14: qty 6

## 2015-09-14 NOTE — ED Notes (Signed)
Pt departed in NAD.  

## 2015-09-14 NOTE — ED Provider Notes (Signed)
CSN: [id number]     Arrival date & time 09/14/15  [id number] History   First MD Initiated Contact with Patient 09/14/15 0403     Chief Complaint  Patient presents with  . Shortness of Breath      HPI Patient reports productive cough tonight.  He does have a history of atrial fibrillation.  He reports that he was coughing and then began to have transient shortness of breath with some tightness in his chest.  EMS gave the patient albuterol which the patient reports significantly improved his breathing.  No history of COPD or emphysema per the patient.  He used to smoke tobacco products but stopped in 2010.  No fevers or chills at home.  No unilateral leg swelling.  No history DVT or pulmonary embolism.   Past Medical History  Diagnosis Date  . HYPOTHYROIDISM 09/04/2007  . ANXIETY 04/26/2008  . Essential hypertension, benign 10/03/2007  . LYMPHADENOPATHY 04/06/2009  . TOBACCO USE, QUIT 07/14/2009  . Atrial fibrillation ([place]) 10/31/2010    Coumadin therapy;  Echo 6/12: EF 50-55%, moderate MR, moderate TR, PASP 52, mild LAE  . Pacemaker     Status post AV nodal ablation  . Nocturia   . BPH (benign prostatic hypertrophy)   . HTN (hypertension) 06/07/2013   Past Surgical History  Procedure Laterality Date  . Pacemaker insertion  10/16/05  . Colonoscopy    . Eye surgery  2000    eye muscle release  . Eye surgery      both cataracts  . Inguinal hernia repair Right 05/06/2013    Procedure: HERNIA REPAIR INGUINAL ADULT;  Surgeon: Haywood Lasso, MD;  Location: [facility];  Service: General;  Laterality: Right;  . Hernia repair     Family History  Problem Relation Age of Onset  . Hypertension Other   . Hypertension Mother   . Cancer Mother     uterine/cervical  . Heart disease Father    Social History  Substance Use Topics  . Smoking status: Former Smoker -- 1.00 packs/day for 30 years    Types: Cigarettes    Quit date: 05/01/1979  . Smokeless tobacco: Never Used  .  Alcohol Use: No    Review of Systems  All other systems reviewed and are negative.     Allergies  Bee venom; Amiodarone hcl; Amoxicillin; Atorvastatin; Benazepril hcl; Clonidine hydrochloride; Colesevelam; Diltiazem hcl; Ezetimibe; and Verapamil  Home Medications   Prior to Admission medications   Medication Sig Start Date End Date Taking? Authorizing Provider  apixaban (ELIQUIS) 5 MG TABS tablet Take 1 tablet (5 mg total) by mouth 2 (two) times daily. 12/30/13  Yes Aleksei Plotnikov V, MD  Ascorbic Acid (VITAMIN C PO) Take 1 tablet by mouth daily.   Yes Historical Provider, MD  carvedilol (COREG) 25 MG tablet Take 1 tablet (25 mg total) by mouth 2 (two) times daily. 12/15/13 09/14/15 Yes Deboraha Sprang, MD  Cholecalciferol 1000 UNITS tablet Take 1,000 Units by mouth daily.     Yes Historical Provider, MD  diphenhydrAMINE (BENADRYL) 25 mg capsule Take 1-2 capsules (25-50 mg total) by mouth every 4 (four) hours as needed for itching or allergies (1-2 for bee stings). 12/23/14 05/31/17 Yes Aleksei Plotnikov V, MD  EPINEPHrine (EPIPEN 2-PAK) 0.3 mg/0.3 mL DEVI Inject 0.3 mg into the muscle daily as needed (allergic reaction). For allergic reaction   Yes Historical Provider, MD  irbesartan (AVAPRO) 300 MG tablet TAKE ONE TABLET BY MOUTH AT BEDTIME Patient  taking differently: Take 150 mg by mouth daily.  12/23/14  Yes Aleksei Plotnikov V, MD  levothyroxine (SYNTHROID, LEVOTHROID) 75 MCG tablet Take 75 mcg by mouth daily.   Yes Historical Provider, MD  torsemide (DEMADEX) 10 MG tablet Take 1-2 tablets (10-20 mg total) by mouth once. Take 20 mg in the AM, 10 MG PM 08/26/15  Yes Aleksei Plotnikov V, MD   BP 161/70 mmHg  Pulse 65  Temp(Src) 98.1 F (36.7 C) (Oral)  Resp 23  SpO2 98% Physical Exam  Constitutional: He is oriented to person, place, and time. He appears well-developed and well-nourished.  HENT:  Head: Normocephalic and atraumatic.  Eyes: EOM are normal.  Neck: Normal range of  motion.  Cardiovascular: Normal rate, regular rhythm, normal heart sounds and intact distal pulses.   Pulmonary/Chest: Effort normal and breath sounds normal. No respiratory distress.  Abdominal: Soft. He exhibits no distension. There is no tenderness.  Musculoskeletal: Normal range of motion.  Neurological: He is alert and oriented to person, place, and time.  Skin: Skin is warm and dry.  Psychiatric: He has a normal mood and affect. Judgment normal.  Nursing note and vitals reviewed.   ED Course  Procedures (including critical care time) Labs Review Labs Reviewed  BASIC METABOLIC PANEL - Abnormal; Notable for the following:    Glucose, Bld 113 (*)    BUN 26 (*)    All other components within normal limits  CBC - Abnormal; Notable for the following:    RBC 3.94 (*)    MCV 102.5 (*)    All other components within normal limits  PROTIME-INR - Abnormal; Notable for the following:    Prothrombin Time 16.6 (*)    All other components within normal limits  BRAIN NATRIURETIC PEPTIDE - Abnormal; Notable for the following:    B Natriuretic Peptide 131.9 (*)    All other components within normal limits  I-STAT TROPOININ, ED    Imaging Review Dg Chest 2 View  09/14/2015  CLINICAL DATA:  Acute onset of shortness of breath and chest tightness. Initial encounter. EXAM: CHEST  2 VIEW COMPARISON:  Chest radiograph performed 10/22/2012 FINDINGS: The lungs are well-aerated. Small bilateral pleural effusions are seen. Mild vascular congestion is noted. There is no evidence of pneumothorax. The heart is borderline normal in size. A pacemaker is noted at the right chest wall, with leads ending at the right atrium and right ventricle. No acute osseous abnormalities are seen. IMPRESSION: Small bilateral pleural effusions noted. Mild vascular congestion noted. Lungs otherwise grossly clear. Electronically Signed   By: Garald Balding M.D.   On: 09/14/2015 04:42   I have personally reviewed and evaluated  these images and lab results as part of my medical decision-making.   EKG Interpretation   Date/Time:  Wednesday September 14 2015 02:41:57 EST Ventricular Rate:  65 PR Interval:    QRS Duration: 155 QT Interval:  450 QTC Calculation: 468 R Axis:   -71 Text Interpretation:  Afib/flutter and ventricular-paced rhythm No further  analysis attempted due to paced rhythm No old tracing to compare Confirmed  by Izea Livolsi  MD, Lennette Bihari ([id number]) on 09/14/2015 4:57:23 AM      MDM   Final diagnoses:  Bronchitis    Patient feels much better at this time.  No complaints.  Reports shortness of breath resolved with bronchodilators.  Likely represents bronchitis with bronchospasm.  Discharge home with albuterol inhaler.  Chest x-ray without pneumonia.  No indication for antibiotics.    Lennette Bihari  Venora Maples, MD 09/14/15 [phone]

## 2015-09-14 NOTE — ED Notes (Signed)
Pt started having a cough with some SOB at 7pm last night and it progressed to chest tightness. Pt called EMS and on EMS arrival breath sounds are diminished gave 5 of albuterol and opened the airway up. EMS heard rales at bases after treatment. Pt has hx of htn, CHF and pacemaker placement

## 2015-09-14 NOTE — Discharge Instructions (Signed)
Upper Respiratory Infection, Adult Most upper respiratory infections (URIs) are a viral infection of the air passages leading to the lungs. A URI affects the nose, throat, and upper air passages. The most common type of URI is nasopharyngitis and is typically referred to as "the common cold." URIs run their course and usually go away on their own. Most of the time, a URI does not require medical attention, but sometimes a bacterial infection in the upper airways can follow a viral infection. This is called a secondary infection. Sinus and middle ear infections are common types of secondary upper respiratory infections. Bacterial pneumonia can also complicate a URI. A URI can worsen asthma and chronic obstructive pulmonary disease (COPD). Sometimes, these complications can require emergency medical care and may be life threatening.  CAUSES Almost all URIs are caused by viruses. A virus is a type of germ and can spread from one person to another.  RISKS FACTORS You may be at risk for a URI if:   You smoke.   You have chronic heart or lung disease.  You have a weakened defense (immune) system.   You are very young or very old.   You have nasal allergies or asthma.  You work in crowded or poorly ventilated areas.  You work in health care facilities or schools. SIGNS AND SYMPTOMS  Symptoms typically develop 2-3 days after you come in contact with a cold virus. Most viral URIs last 7-10 days. However, viral URIs from the influenza virus (flu virus) can last 14-18 days and are typically more severe. Symptoms may include:   Runny or stuffy (congested) nose.   Sneezing.   Cough.   Sore throat.   Headache.   Fatigue.   Fever.   Loss of appetite.   Pain in your forehead, behind your eyes, and over your cheekbones (sinus pain).  Muscle aches.  DIAGNOSIS  Your health care provider may diagnose a URI by:  Physical exam.  Tests to check that your symptoms are not due to  another condition such as:  Strep throat.  Sinusitis.  Pneumonia.  Asthma. TREATMENT  A URI goes away on its own with time. It cannot be cured with medicines, but medicines may be prescribed or recommended to relieve symptoms. Medicines may help:  Reduce your fever.  Reduce your cough.  Relieve nasal congestion. HOME CARE INSTRUCTIONS   Take medicines only as directed by your health care provider.   Gargle warm saltwater or take cough drops to comfort your throat as directed by your health care provider.  Use a warm mist humidifier or inhale steam from a shower to increase air moisture. This may make it easier to breathe.  Drink enough fluid to keep your urine clear or pale yellow.   Eat soups and other clear broths and maintain good nutrition.   Rest as needed.   Return to work when your temperature has returned to normal or as your health care provider advises. You may need to stay home longer to avoid infecting others. You can also use a face mask and careful hand washing to prevent spread of the virus.  Increase the usage of your inhaler if you have asthma.   Do not use any tobacco products, including cigarettes, chewing tobacco, or electronic cigarettes. If you need help quitting, ask your health care provider. PREVENTION  The best way to protect yourself from getting a cold is to practice good hygiene.   Avoid oral or hand contact with people with cold   symptoms.   Wash your hands often if contact occurs.  There is no clear evidence that vitamin C, vitamin E, echinacea, or exercise reduces the chance of developing a cold. However, it is always recommended to get plenty of rest, exercise, and practice good nutrition.  SEEK MEDICAL CARE IF:   You are getting worse rather than better.   Your symptoms are not controlled by medicine.   You have chills.  You have worsening shortness of breath.  You have brown or red mucus.  You have yellow or brown nasal  discharge.  You have pain in your face, especially when you bend forward.  You have a fever.  You have swollen neck glands.  You have pain while swallowing.  You have white areas in the back of your throat. SEEK IMMEDIATE MEDICAL CARE IF:   You have severe or persistent:  Headache.  Ear pain.  Sinus pain.  Chest pain.  You have chronic lung disease and any of the following:  Wheezing.  Prolonged cough.  Coughing up blood.  A change in your usual mucus.  You have a stiff neck.  You have changes in your:  Vision.  Hearing.  Thinking.  Mood. MAKE SURE YOU:   Understand these instructions.  Will watch your condition.  Will get help right away if you are not doing well or get worse.   This information is not intended to replace advice given to you by your health care provider. Make sure you discuss any questions you have with your health care provider.   Document Released: 02/27/2001 Document Revised: 01/18/2015 Document Reviewed: 12/09/2013 Elsevier Interactive Patient Education 2016 [employer]  

## 2015-09-15 ENCOUNTER — Ambulatory Visit (INDEPENDENT_AMBULATORY_CARE_PROVIDER_SITE_OTHER): Payer: PPO | Admitting: Family

## 2015-09-15 ENCOUNTER — Encounter: Payer: Self-pay | Admitting: Family

## 2015-09-15 VITALS — BP 122/74 | HR 64 | Temp 98.0°F | Resp 14 | Wt 175.0 lb

## 2015-09-15 DIAGNOSIS — J209 Acute bronchitis, unspecified: Secondary | ICD-10-CM | POA: Diagnosis not present

## 2015-09-15 NOTE — Progress Notes (Signed)
Pre visit review using our clinic review tool, if applicable. No additional management support is needed unless otherwise documented below in the visit note. 

## 2015-09-15 NOTE — Assessment & Plan Note (Signed)
Symptoms consistent with bronchitis and and started on levofloxacin and albuterol. Reports significant improvements. Recommend continue levofloxacin  Until completed and albuterol as needed. Continue over-the-counter medications as needed for symptom relief and supportive care. Follow-up if symptoms worsen or fail to improve.

## 2015-09-15 NOTE — Patient Instructions (Signed)
Thank you for choosing [facility].  Summary/Instructions:  Your prescription(s) have been submitted to your pharmacy or been printed and provided for you. Please take as directed and contact our office if you believe you are having problem(s) with the medication(s) or have any questions.  If your symptoms worsen or fail to improve, please contact our office for further instruction, or in case of emergency go directly to the emergency room at the closest medical facility.   Continue the inhaler as needed. May use 1-2 puffs every 6 hours for shortness of breath or wheezing.  Please complete the antibiotics that were prescribed.

## 2015-09-15 NOTE — Progress Notes (Signed)
Subjective:    Patient ID: Ricky [name], male    DOB: 07-May-1923, 81 y.o.   MRN: [id number]  Chief Complaint  Patient presents with  . Hospitalization Follow-up    states that his breathing has improved, still having productive cough, thinks he has a sinus infection    HPI:  Ricky [name] is a 81 y.o. male who  has a past medical history of HYPOTHYROIDISM (09/04/2007); ANXIETY (04/26/2008); Essential hypertension, benign (10/03/2007); LYMPHADENOPATHY (04/06/2009); TOBACCO USE, QUIT (07/14/2009); Atrial fibrillation ([place]) (10/31/2010); Pacemaker; Nocturia; BPH (benign prostatic hypertrophy); and HTN (hypertension) (06/07/2013). and presents today for a follow-up office visit.  Recently evaluated in the emergency department for coughing and transient shortness of breath. Was treated with albuterol that significantly improved his breathing. EKG revealed atrial fibrillation/flutter with a ventricular paced rhythm.  Chest x-ray showed small bilateral pleural effusions and mild vascular congestion with no evidence of pneumonia. He was diagnosed with bronchitis with bronchospasms and instructed to follow-up in primary care. All ED records and imaging were reviewed in detail.   Since leaving the emergency department he notes that his breathing has improved, but he still has the associated symptoms of productive cough with concern for sinus infection. Denies fevers. He was given a prescription for levaquin which he started taking as prescribed and denies adverse side effects. He is also using the albuterol which has helped with his breathing that he states is about 70% improved.   Allergies  Allergen Reactions  . Bee Venom Anaphylaxis  . Amiodarone Hcl     Weakness in muscles   . Amoxicillin     Rash  . Atorvastatin     unknown  . Benazepril Hcl     unknown  . Clonidine Hydrochloride     unknown  . Colesevelam     unknown  . Diltiazem Hcl     unknown  . Ezetimibe     Unknown   .  Verapamil Other (See Comments)    NO REACTION LISTED     Current Outpatient Prescriptions on File Prior to Visit  Medication Sig Dispense Refill  . apixaban (ELIQUIS) 5 MG TABS tablet Take 1 tablet (5 mg total) by mouth 2 (two) times daily. 180 tablet 3  . Ascorbic Acid (VITAMIN C PO) Take 1 tablet by mouth daily.    . Cholecalciferol 1000 UNITS tablet Take 1,000 Units by mouth daily.      . diphenhydrAMINE (BENADRYL) 25 mg capsule Take 1-2 capsules (25-50 mg total) by mouth every 4 (four) hours as needed for itching or allergies (1-2 for bee stings). 60 capsule 2  . EPINEPHrine (EPIPEN 2-PAK) 0.3 mg/0.3 mL DEVI Inject 0.3 mg into the muscle daily as needed (allergic reaction). For allergic reaction    . irbesartan (AVAPRO) 300 MG tablet TAKE ONE TABLET BY MOUTH AT BEDTIME (Patient taking differently: Take 150 mg by mouth daily. ) 90 tablet 3  . levofloxacin (LEVAQUIN) 500 MG tablet Take 1 tablet (500 mg total) by mouth daily. 7 tablet 0  . levothyroxine (SYNTHROID, LEVOTHROID) 75 MCG tablet Take 75 mcg by mouth daily.    Marland Kitchen torsemide (DEMADEX) 10 MG tablet Take 1-2 tablets (10-20 mg total) by mouth once. Take 20 mg in the AM, 10 MG PM 270 tablet 3  . carvedilol (COREG) 25 MG tablet Take 1 tablet (25 mg total) by mouth 2 (two) times daily. 180 tablet 3   No current facility-administered medications on file prior to visit.  Past Surgical History  Procedure Laterality Date  . Pacemaker insertion  10/16/05  . Colonoscopy    . Eye surgery  2000    eye muscle release  . Eye surgery      both cataracts  . Inguinal hernia repair Right 05/06/2013    Procedure: HERNIA REPAIR INGUINAL ADULT;  Surgeon: Haywood Lasso, MD;  Location: [facility];  Service: General;  Laterality: Right;  . Hernia repair      Review of Systems  Constitutional: Negative for fever and chills.  HENT: Positive for congestion and rhinorrhea.   Respiratory: Positive for cough. Negative for chest  tightness and shortness of breath.   Cardiovascular: Negative for chest pain.  Neurological: Negative for headaches.      Objective:    BP 122/74 mmHg  Pulse 64  Temp(Src) 98 F (36.7 C) (Oral)  Resp 14  Wt 175 lb (79.379 kg)  SpO2 98% Nursing note and vital signs reviewed.  Physical Exam  Constitutional: He is oriented to person, place, and time. He appears well-developed and well-nourished. No distress.  HENT:  Right Ear: Hearing, tympanic membrane, external ear and ear canal normal.  Left Ear: Hearing, tympanic membrane, external ear and ear canal normal.  Nose: Nose normal. Right sinus exhibits no maxillary sinus tenderness and no frontal sinus tenderness. Left sinus exhibits no maxillary sinus tenderness and no frontal sinus tenderness.  Mouth/Throat: Uvula is midline, oropharynx is clear and moist and mucous membranes are normal.  Cardiovascular: Normal rate, regular rhythm, normal heart sounds and intact distal pulses.   Pulmonary/Chest: Effort normal and breath sounds normal.  Neurological: He is alert and oriented to person, place, and time.  Skin: Skin is warm and dry.  Psychiatric: He has a normal mood and affect. His behavior is normal. Judgment and thought content normal.       Assessment & Plan:   Problem List Items Addressed This Visit      Respiratory   Acute bronchitis - Primary     Symptoms consistent with bronchitis and and started on levofloxacin and albuterol. Reports significant improvements. Recommend continue levofloxacin  Until completed and albuterol as needed. Continue over-the-counter medications as needed for symptom relief and supportive care. Follow-up if symptoms worsen or fail to improve.

## 2015-10-10 ENCOUNTER — Telehealth: Payer: Self-pay | Admitting: Internal Medicine

## 2015-10-10 NOTE — Telephone Encounter (Signed)
Patient states he received a bill from [employer] stating Ricky [name] is not in network for seeing him for an ER follow up because Plotnikov was not available.  Can you please help to get fixed?

## 2015-10-11 NOTE — Telephone Encounter (Signed)
I have contacted coding to look into this and get back to me.

## 2015-10-24 NOTE — Telephone Encounter (Signed)
Notified patient.

## 2015-10-24 NOTE — Telephone Encounter (Signed)
Charge correction has been notified by our coding specialist to refile a corrected claim for [facility] 09/15/15, listing Dr. Alain Marion as the billing provider.  Tammy-please notify patient, the copay error is not his responsibility.

## 2015-10-25 ENCOUNTER — Encounter: Payer: Self-pay | Admitting: Internal Medicine

## 2015-10-25 ENCOUNTER — Ambulatory Visit (INDEPENDENT_AMBULATORY_CARE_PROVIDER_SITE_OTHER): Payer: PPO | Admitting: Internal Medicine

## 2015-10-25 VITALS — BP 130/70 | HR 65 | Wt 170.0 lb

## 2015-10-25 DIAGNOSIS — N62 Hypertrophy of breast: Secondary | ICD-10-CM | POA: Diagnosis not present

## 2015-10-25 DIAGNOSIS — D485 Neoplasm of uncertain behavior of skin: Secondary | ICD-10-CM | POA: Insufficient documentation

## 2015-10-25 MED ORDER — TORSEMIDE 10 MG PO TABS: 10.0000 mg | ORAL_TABLET | ORAL | Status: DC

## 2015-10-25 NOTE — Assessment & Plan Note (Signed)
2/17 B  L nipple pain Diag mammogram

## 2015-10-25 NOTE — Progress Notes (Signed)
Pre visit review using our clinic review tool, if applicable. No additional management support is needed unless otherwise documented below in the visit note. 

## 2015-10-25 NOTE — Progress Notes (Signed)
Subjective:  Patient ID: Ricky [name], male    DOB: [birth year]  Age: 82 y.o. MRN: [id number]  CC: No chief complaint on file.   HPI TIN STANKUS presents for L nipple pain, ?mass C/o mass in R axilla   Outpatient Prescriptions Prior to Visit  Medication Sig Dispense Refill  . apixaban (ELIQUIS) 5 MG TABS tablet Take 1 tablet (5 mg total) by mouth 2 (two) times daily. 180 tablet 3  . Ascorbic Acid (VITAMIN C PO) Take 1 tablet by mouth daily.    . Cholecalciferol 1000 UNITS tablet Take 1,000 Units by mouth daily.      . diphenhydrAMINE (BENADRYL) 25 mg capsule Take 1-2 capsules (25-50 mg total) by mouth every 4 (four) hours as needed for itching or allergies (1-2 for bee stings). 60 capsule 2  . EPINEPHrine (EPIPEN 2-PAK) 0.3 mg/0.3 mL DEVI Inject 0.3 mg into the muscle daily as needed (allergic reaction). For allergic reaction    . irbesartan (AVAPRO) 300 MG tablet TAKE ONE TABLET BY MOUTH AT BEDTIME (Patient taking differently: Take 150 mg by mouth daily. ) 90 tablet 3  . levothyroxine (SYNTHROID, LEVOTHROID) 75 MCG tablet Take 75 mcg by mouth daily.    Marland Kitchen torsemide (DEMADEX) 10 MG tablet Take 1-2 tablets (10-20 mg total) by mouth once. Take 20 mg in the AM, 10 MG PM (Patient taking differently: Take 10 mg by mouth as directed. Take 20 mg in the AM, 10 MG PM) 270 tablet 3  . carvedilol (COREG) 25 MG tablet Take 1 tablet (25 mg total) by mouth 2 (two) times daily. 180 tablet 3  . levofloxacin (LEVAQUIN) 500 MG tablet Take 1 tablet (500 mg total) by mouth daily. (Patient not taking: Reported on 10/25/2015) 7 tablet 0   No facility-administered medications prior to visit.    ROS Review of Systems  Constitutional: Positive for fever. Negative for chills.  Cardiovascular: Negative for chest pain.  Gastrointestinal: Negative for abdominal pain.    Objective:  BP 130/70 mmHg  Pulse 65  Wt 170 lb (77.111 kg)  SpO2 97%  BP Readings from Last 3 Encounters:  10/25/15 130/70    09/15/15 122/74  09/14/15 134/67    Wt Readings from Last 3 Encounters:  10/25/15 170 lb (77.111 kg)  09/15/15 175 lb (79.379 kg)  08/26/15 173 lb (78.472 kg)    Physical Exam  Constitutional: No distress.  HENT:  Head: Normocephalic.  Mouth/Throat: Oropharynx is clear and moist.  Eyes: Right eye exhibits no discharge.  Cardiovascular:  Murmur heard. Pulmonary/Chest: He has no wheezes.  Abdominal: He exhibits no distension. There is no tenderness.  Musculoskeletal: He exhibits no edema.  Skin: No rash noted. No erythema.  R axilla 11 mm nodule B gynecomastia   Lab Results  Component Value Date   WBC 9.6 09/14/2015   HGB 13.0 09/14/2015   HCT 40.4 09/14/2015   PLT 219 09/14/2015   GLUCOSE 113* 09/14/2015   CHOL 210* 12/28/2013   TRIG 59.0 12/28/2013   HDL 63.40 12/28/2013   LDLCALC 135* 12/28/2013   ALT 12 08/26/2015   AST 20 08/26/2015   NA 141 09/14/2015   K 4.2 09/14/2015   CL 102 09/14/2015   CREATININE 1.02 09/14/2015   BUN 26* 09/14/2015   CO2 28 09/14/2015   TSH 1.46 08/26/2015   PSA 0.84 12/28/2013   INR 1.33 09/14/2015    Dg Chest 2 View  09/14/2015  CLINICAL DATA:  Acute onset of shortness of breath  and chest tightness. Initial encounter. EXAM: CHEST  2 VIEW COMPARISON:  Chest radiograph performed 10/22/2012 FINDINGS: The lungs are well-aerated. Small bilateral pleural effusions are seen. Mild vascular congestion is noted. There is no evidence of pneumothorax. The heart is borderline normal in size. A pacemaker is noted at the right chest wall, with leads ending at the right atrium and right ventricle. No acute osseous abnormalities are seen. IMPRESSION: Small bilateral pleural effusions noted. Mild vascular congestion noted. Lungs otherwise grossly clear. Electronically Signed   By: Garald Balding M.D.   On: 09/14/2015 04:42    Assessment & Plan:   Diagnoses and all orders for this visit:  Gynecomastia, male -     MM Digital Diagnostic  Bilat  Neoplasm of uncertain behavior of skin  Other orders -     torsemide (DEMADEX) 10 MG tablet; Take 1 tablet (10 mg total) by mouth as directed. Take 2 by mouth every morning and 1 by mouth every evening.  I have discontinued Mr. Beecroft torsemide and levofloxacin. I am also having him start on torsemide. Additionally, I am having him maintain his EPINEPHrine, Cholecalciferol, levothyroxine, carvedilol, apixaban, Ascorbic Acid (VITAMIN C PO), irbesartan, and diphenhydrAMINE.  Meds ordered this encounter  Medications  . torsemide (DEMADEX) 10 MG tablet    Sig: Take 1 tablet (10 mg total) by mouth as directed. Take 2 by mouth every morning and 1 by mouth every evening.    Dispense:  270 tablet    Refill:  1     Follow-up: No Follow-up on file.  Walker Kehr, MD

## 2015-10-25 NOTE — Assessment & Plan Note (Signed)
Skin bx 

## 2015-10-26 ENCOUNTER — Telehealth: Payer: Self-pay | Admitting: Internal Medicine

## 2015-10-26 NOTE — Telephone Encounter (Signed)
Pacemaker is ok for a mammogram Thx

## 2015-10-26 NOTE — Telephone Encounter (Signed)
Pt was in yesterday and Dr. Camila Li had ordered a mammogram for him. 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

## 2015-10-27 ENCOUNTER — Encounter: Payer: Self-pay | Admitting: Internal Medicine

## 2015-10-27 ENCOUNTER — Other Ambulatory Visit: Payer: Self-pay | Admitting: Internal Medicine

## 2015-10-27 DIAGNOSIS — N62 Hypertrophy of breast: Secondary | ICD-10-CM

## 2015-10-27 NOTE — Telephone Encounter (Signed)
Pt advised and understood. 

## 2015-10-31 ENCOUNTER — Ambulatory Visit (INDEPENDENT_AMBULATORY_CARE_PROVIDER_SITE_OTHER): Payer: PPO | Admitting: Internal Medicine

## 2015-10-31 ENCOUNTER — Encounter: Payer: Self-pay | Admitting: Internal Medicine

## 2015-10-31 VITALS — BP 120/60 | HR 65 | Wt 170.0 lb

## 2015-10-31 DIAGNOSIS — D489 Neoplasm of uncertain behavior, unspecified: Secondary | ICD-10-CM

## 2015-10-31 DIAGNOSIS — Z7901 Long term (current) use of anticoagulants: Secondary | ICD-10-CM | POA: Insufficient documentation

## 2015-10-31 DIAGNOSIS — D485 Neoplasm of uncertain behavior of skin: Secondary | ICD-10-CM

## 2015-10-31 NOTE — Progress Notes (Signed)
Subjective:  Patient ID: Ricky [name], male    DOB: June 25, 1923  Age: 82 y.o. MRN: [id number]  CC: No chief complaint on file.   HPI Ricky [name] presents for skin bx  Outpatient Prescriptions Prior to Visit  Medication Sig Dispense Refill  . apixaban (ELIQUIS) 5 MG TABS tablet Take 1 tablet (5 mg total) by mouth 2 (two) times daily. 180 tablet 3  . Ascorbic Acid (VITAMIN C PO) Take 1 tablet by mouth daily.    . Cholecalciferol 1000 UNITS tablet Take 1,000 Units by mouth daily.      . diphenhydrAMINE (BENADRYL) 25 mg capsule Take 1-2 capsules (25-50 mg total) by mouth every 4 (four) hours as needed for itching or allergies (1-2 for bee stings). 60 capsule 2  . EPINEPHrine (EPIPEN 2-PAK) 0.3 mg/0.3 mL DEVI Inject 0.3 mg into the muscle daily as needed (allergic reaction). For allergic reaction    . irbesartan (AVAPRO) 300 MG tablet TAKE ONE TABLET BY MOUTH AT BEDTIME (Patient taking differently: Take 150 mg by mouth daily. ) 90 tablet 3  . levothyroxine (SYNTHROID, LEVOTHROID) 75 MCG tablet Take 75 mcg by mouth daily.    Marland Kitchen torsemide (DEMADEX) 10 MG tablet Take 1 tablet (10 mg total) by mouth as directed. Take 2 by mouth every morning and 1 by mouth every evening. 270 tablet 1  . carvedilol (COREG) 25 MG tablet Take 1 tablet (25 mg total) by mouth 2 (two) times daily. 180 tablet 3   No facility-administered medications prior to visit.    ROS Review of Systems  Skin: Negative for pallor.  no bruising  Objective:  BP 120/60 mmHg  Pulse 65  Wt 170 lb (77.111 kg)  SpO2 96%  BP Readings from Last 3 Encounters:  10/31/15 120/60  10/25/15 130/70  09/15/15 122/74    Wt Readings from Last 3 Encounters:  10/31/15 170 lb (77.111 kg)  10/25/15 170 lb (77.111 kg)  09/15/15 175 lb (79.379 kg)    Physical Exam 11x11 mm lesion in the R axilla  Lesion #1 in R axilla measuring 21x21  mm   Skin over lesion #1  was prepped with Betadine and alcohol  and anesthetized with 1 cc  of 2% lidocaine and epinephrine, using a 25-gauge 1 inch needle. Excisional biopsy with a sterile #11 blade was carried out. 1.5 cc of fatty material was extracted. The cyst was excised and the cavity was irrigated. The wound was closed with two 5.0 ethilone sutures. Bleeding was above average - stopped. Band-Aid was applied with antibiotic ointment. Pressure dressing.   Tolerated well. Complications none.   Lab Results  Component Value Date   WBC 9.6 09/14/2015   HGB 13.0 09/14/2015   HCT 40.4 09/14/2015   PLT 219 09/14/2015   GLUCOSE 113* 09/14/2015   CHOL 210* 12/28/2013   TRIG 59.0 12/28/2013   HDL 63.40 12/28/2013   LDLCALC 135* 12/28/2013   ALT 12 08/26/2015   AST 20 08/26/2015   NA 141 09/14/2015   K 4.2 09/14/2015   CL 102 09/14/2015   CREATININE 1.02 09/14/2015   BUN 26* 09/14/2015   CO2 28 09/14/2015   TSH 1.46 08/26/2015   PSA 0.84 12/28/2013   INR 1.33 09/14/2015    Dg Chest 2 View  09/14/2015  CLINICAL DATA:  Acute onset of shortness of breath and chest tightness. Initial encounter. EXAM: CHEST  2 VIEW COMPARISON:  Chest radiograph performed 10/22/2012 FINDINGS: The lungs are well-aerated. Small bilateral pleural effusions  are seen. Mild vascular congestion is noted. There is no evidence of pneumothorax. The heart is borderline normal in size. A pacemaker is noted at the right chest wall, with leads ending at the right atrium and right ventricle. No acute osseous abnormalities are seen. IMPRESSION: Small bilateral pleural effusions noted. Mild vascular congestion noted. Lungs otherwise grossly clear. Electronically Signed   By: Garald Balding M.D.   On: 09/14/2015 04:42    Assessment & Plan:   Diagnoses and all orders for this visit:  Neoplasm of uncertain behavior -     Dermatology pathology  I am having Mr. Witmer maintain his EPINEPHrine, Cholecalciferol, levothyroxine, carvedilol, apixaban, Ascorbic Acid (VITAMIN C PO), irbesartan, diphenhydrAMINE, and  torsemide.  No orders of the defined types were placed in this encounter.     Follow-up: Return in about 2 weeks (around 11/14/2015).  Walker Kehr, MD

## 2015-10-31 NOTE — Assessment & Plan Note (Signed)
On Eliquis Hemostasis was achieved w/pressure

## 2015-10-31 NOTE — Patient Instructions (Addendum)
Wash with soap and pat dry Band aid with antibiotic ointment Suture removal in 12-14 days

## 2015-10-31 NOTE — Progress Notes (Signed)
Pre visit review using our clinic review tool, if applicable. No additional management support is needed unless otherwise documented below in the visit note. 

## 2015-10-31 NOTE — Assessment & Plan Note (Signed)
See procedure 

## 2015-11-04 ENCOUNTER — Other Ambulatory Visit: Payer: Self-pay | Admitting: Internal Medicine

## 2015-11-04 DIAGNOSIS — N62 Hypertrophy of breast: Secondary | ICD-10-CM

## 2015-11-08 ENCOUNTER — Ambulatory Visit
Admission: RE | Admit: 2015-11-08 | Discharge: 2015-11-08 | Disposition: A | Discharge status: Home / Self Care | Payer: PPO | Source: Ambulatory Visit | Attending: Internal Medicine | Admitting: Internal Medicine

## 2015-11-08 DIAGNOSIS — N62 Hypertrophy of breast: Secondary | ICD-10-CM

## 2015-11-14 ENCOUNTER — Encounter: Payer: Self-pay | Admitting: Internal Medicine

## 2015-11-14 ENCOUNTER — Ambulatory Visit (INDEPENDENT_AMBULATORY_CARE_PROVIDER_SITE_OTHER): Payer: PPO | Admitting: Internal Medicine

## 2015-11-14 VITALS — BP 144/82 | HR 65 | Wt 170.0 lb

## 2015-11-14 DIAGNOSIS — D485 Neoplasm of uncertain behavior of skin: Secondary | ICD-10-CM

## 2015-11-14 DIAGNOSIS — L570 Actinic keratosis: Secondary | ICD-10-CM | POA: Diagnosis not present

## 2015-11-14 NOTE — Patient Instructions (Signed)
   Postprocedure instructions :     Keep the wounds clean. You can wash them with liquid soap and water. Pat dry with gauze or a Kleenex tissue  Before applying antibiotic ointment and a Band-Aid.   You need to report immediately  if  any signs of infection develop.    

## 2015-11-14 NOTE — Assessment & Plan Note (Signed)
Sutures were removed  

## 2015-11-14 NOTE — Assessment & Plan Note (Signed)
Will freeze See cryo

## 2015-11-14 NOTE — Progress Notes (Signed)
Pre visit review using our clinic review tool, if applicable. No additional management support is needed unless otherwise documented below in the visit note. 

## 2015-11-14 NOTE — Progress Notes (Signed)
Subjective:  Patient ID: Ricky [name], male    DOB: September 27, 1922  Age: 82 y.o. MRN: [id number]  CC: No chief complaint on file.   HPI Ricky [name] presents for suture removal. The pt is c/o skin spots on L ear and face that don't heal x months  Outpatient Prescriptions Prior to Visit  Medication Sig Dispense Refill  . apixaban (ELIQUIS) 5 MG TABS tablet Take 1 tablet (5 mg total) by mouth 2 (two) times daily. 180 tablet 3  . Ascorbic Acid (VITAMIN C PO) Take 1 tablet by mouth daily.    . Cholecalciferol 1000 UNITS tablet Take 1,000 Units by mouth daily.      . diphenhydrAMINE (BENADRYL) 25 mg capsule Take 1-2 capsules (25-50 mg total) by mouth every 4 (four) hours as needed for itching or allergies (1-2 for bee stings). 60 capsule 2  . EPINEPHrine (EPIPEN 2-PAK) 0.3 mg/0.3 mL DEVI Inject 0.3 mg into the muscle daily as needed (allergic reaction). For allergic reaction    . irbesartan (AVAPRO) 300 MG tablet TAKE ONE TABLET BY MOUTH AT BEDTIME (Patient taking differently: Take 150 mg by mouth daily. ) 90 tablet 3  . levothyroxine (SYNTHROID, LEVOTHROID) 75 MCG tablet Take 75 mcg by mouth daily.    Marland Kitchen torsemide (DEMADEX) 10 MG tablet Take 1 tablet (10 mg total) by mouth as directed. Take 2 by mouth every morning and 1 by mouth every evening. 270 tablet 1  . carvedilol (COREG) 25 MG tablet Take 1 tablet (25 mg total) by mouth 2 (two) times daily. 180 tablet 3   No facility-administered medications prior to visit.    ROS Review of Systems  Constitutional: Negative for fever and fatigue.  Skin: Positive for rash. Negative for color change and pallor.  Hematological: Negative for adenopathy. Bruises/bleeds easily.    Objective:  BP 144/82 mmHg  Pulse 65  Wt 170 lb (77.111 kg)  SpO2 96%  BP Readings from Last 3 Encounters:  11/14/15 144/82  10/31/15 120/60  10/25/15 130/70    Wt Readings from Last 3 Encounters:  11/14/15 170 lb (77.111 kg)  10/31/15 170 lb (77.111 kg)    10/25/15 170 lb (77.111 kg)    Physical Exam R axilla wound is healed - 2 sutures were removed AKs R ear and face x 3   Procedure Note :     Procedure : Cryosurgery   Indication:   Actinic keratosis(es)   Risks including unsuccessful procedure , bleeding, infection, bruising, scar, a need for a repeat  procedure and others were explained to the patient in detail as well as the benefits. Informed consent was obtained verbally.    3 lesion(s)  on face and L ear   was/were treated with liquid nitrogen on a Q-tip in a usual fasion . Band-Aid was applied and antibiotic ointment was given for a later use.   Tolerated well. Complications none.   Postprocedure instructions :     Keep the wounds clean. You can wash them with liquid soap and water. Pat dry with gauze or a Kleenex tissue  Before applying antibiotic ointment and a Band-Aid.   You need to report immediately  if  any signs of infection develop.    Lab Results  Component Value Date   WBC 9.6 09/14/2015   HGB 13.0 09/14/2015   HCT 40.4 09/14/2015   PLT 219 09/14/2015   GLUCOSE 113* 09/14/2015   CHOL 210* 12/28/2013   TRIG 59.0 12/28/2013   HDL 63.40  12/28/2013   LDLCALC 135* 12/28/2013   ALT 12 08/26/2015   AST 20 08/26/2015   NA 141 09/14/2015   K 4.2 09/14/2015   CL 102 09/14/2015   CREATININE 1.02 09/14/2015   BUN 26* 09/14/2015   CO2 28 09/14/2015   TSH 1.46 08/26/2015   PSA 0.84 12/28/2013   INR 1.33 09/14/2015    US Breast Ltd Uni Left Inc Axilla  11/08/2015  CLINICAL DATA:  82 year old male presenting for evaluation of intermittent left nipple tenderness. EXAM: DIGITAL DIAGNOSTIC BILATERAL MAMMOGRAM WITH 3D TOMOSYNTHESIS AND CAD LEFT BREAST ULTRASOUND COMPARISON:  None. ACR Breast Density Category a: The breast tissue is almost entirely fatty. FINDINGS: In the left greater than right breast, there is flame shaped density central to the nipple, most consistent with gynecomastia. No discrete masses are seen  within this dense tissue. However, due to the abundance of this tissue in the left breast and the associated symptoms of left nipple tenderness, ultrasound will be performed for further evaluation. Mammographic images were processed with CAD. Physical exam of the left breast demonstrates firm tissue in the subareolar region, without discrete palpable mass. Exam of the contralateral breast demonstrates similar tissue. Ultrasound of the subareolar left breast demonstrates branching fibroglandular tissue without discrete mass or suspicious area of shadowing. The contralateral breast was imaged for comparison purposes, demonstrating similar but less abundant tissue. IMPRESSION: 1. Benign gynecomastia is identified in the left greater than right breast. This can be associated with pain. 2.  No mammographic evidence of malignancy in the bilateral breasts. RECOMMENDATION: Clinical follow-up recommended for the left greater than right gynecomastia and associated left nipple sensitivity. I have discussed the findings and recommendations with the patient. Results were also provided in writing at the conclusion of the visit. If applicable, a reminder letter will be sent to the patient regarding the next appointment. BI-RADS CATEGORY  2: Benign. Electronically Signed   By: Ammie Ferrier M.D.   On: 11/08/2015 16:31   Mm Diag Breast Tomo Bilateral  11/08/2015  CLINICAL DATA:  83 year old male presenting for evaluation of intermittent left nipple tenderness. EXAM: DIGITAL DIAGNOSTIC BILATERAL MAMMOGRAM WITH 3D TOMOSYNTHESIS AND CAD LEFT BREAST ULTRASOUND COMPARISON:  None. ACR Breast Density Category a: The breast tissue is almost entirely fatty. FINDINGS: In the left greater than right breast, there is flame shaped density central to the nipple, most consistent with gynecomastia. No discrete masses are seen within this dense tissue. However, due to the abundance of this tissue in the left breast and the associated symptoms  of left nipple tenderness, ultrasound will be performed for further evaluation. Mammographic images were processed with CAD. Physical exam of the left breast demonstrates firm tissue in the subareolar region, without discrete palpable mass. Exam of the contralateral breast demonstrates similar tissue. Ultrasound of the subareolar left breast demonstrates branching fibroglandular tissue without discrete mass or suspicious area of shadowing. The contralateral breast was imaged for comparison purposes, demonstrating similar but less abundant tissue. IMPRESSION: 1. Benign gynecomastia is identified in the left greater than right breast. This can be associated with pain. 2.  No mammographic evidence of malignancy in the bilateral breasts. RECOMMENDATION: Clinical follow-up recommended for the left greater than right gynecomastia and associated left nipple sensitivity. I have discussed the findings and recommendations with the patient. Results were also provided in writing at the conclusion of the visit. If applicable, a reminder letter will be sent to the patient regarding the next appointment. BI-RADS CATEGORY  2: Benign. Electronically Signed  By: Ammie Ferrier M.D.   On: 11/08/2015 16:31    Assessment & Plan:   There are no diagnoses linked to this encounter. I am having Mr. Birschbach maintain his EPINEPHrine, Cholecalciferol, levothyroxine, carvedilol, apixaban, Ascorbic Acid (VITAMIN C PO), irbesartan, diphenhydrAMINE, and torsemide.  No orders of the defined types were placed in this encounter.     Follow-up: No Follow-up on file.  Walker Kehr, MD

## 2015-12-23 ENCOUNTER — Encounter: Payer: Self-pay | Admitting: Internal Medicine

## 2015-12-23 ENCOUNTER — Ambulatory Visit (INDEPENDENT_AMBULATORY_CARE_PROVIDER_SITE_OTHER): Payer: PPO | Admitting: Internal Medicine

## 2015-12-23 VITALS — BP 150/82 | HR 70 | Temp 98.2°F | Resp 16 | Ht 72.0 in | Wt 174.0 lb

## 2015-12-23 DIAGNOSIS — I4891 Unspecified atrial fibrillation: Secondary | ICD-10-CM

## 2015-12-23 DIAGNOSIS — I10 Essential (primary) hypertension: Secondary | ICD-10-CM | POA: Diagnosis not present

## 2015-12-23 DIAGNOSIS — B079 Viral wart, unspecified: Secondary | ICD-10-CM | POA: Diagnosis not present

## 2015-12-23 DIAGNOSIS — E032 Hypothyroidism due to medicaments and other exogenous substances: Secondary | ICD-10-CM | POA: Diagnosis not present

## 2015-12-23 NOTE — Patient Instructions (Signed)
   Postprocedure instructions :     Keep the wounds clean. You can wash them with liquid soap and water. Pat dry with gauze or a Kleenex tissue  Before applying antibiotic ointment and a Band-Aid.   You need to report immediately  if  any signs of infection develop.    

## 2015-12-23 NOTE — Progress Notes (Signed)
Pre visit review using our clinic review tool, if applicable. No additional management support is needed unless otherwise documented below in the visit note. 

## 2015-12-23 NOTE — Progress Notes (Signed)
Subjective:  Patient ID: Ricky [name], male    DOB: [birth year]  Age: 82 y.o. MRN: [id number]  CC: No chief complaint on file.   HPI MOHD. WIRTS presents for anticoagulation, hypothyroidism, HTN f/u. C/o warts on face  Outpatient Prescriptions Prior to Visit  Medication Sig Dispense Refill  . apixaban (ELIQUIS) 5 MG TABS tablet Take 1 tablet (5 mg total) by mouth 2 (two) times daily. 180 tablet 3  . Ascorbic Acid (VITAMIN C PO) Take 1 tablet by mouth daily.    . carvedilol (COREG) 25 MG tablet Take 1 tablet (25 mg total) by mouth 2 (two) times daily. 180 tablet 3  . diphenhydrAMINE (BENADRYL) 25 mg capsule Take 1-2 capsules (25-50 mg total) by mouth every 4 (four) hours as needed for itching or allergies (1-2 for bee stings). 60 capsule 2  . EPINEPHrine (EPIPEN 2-PAK) 0.3 mg/0.3 mL DEVI Inject 0.3 mg into the muscle daily as needed (allergic reaction). For allergic reaction    . irbesartan (AVAPRO) 300 MG tablet TAKE ONE TABLET BY MOUTH AT BEDTIME (Patient taking differently: Take 150 mg by mouth daily. ) 90 tablet 3  . levothyroxine (SYNTHROID, LEVOTHROID) 75 MCG tablet Take 75 mcg by mouth daily.    Marland Kitchen torsemide (DEMADEX) 10 MG tablet Take 1 tablet (10 mg total) by mouth as directed. Take 2 by mouth every morning and 1 by mouth every evening. 270 tablet 1  . Cholecalciferol 1000 UNITS tablet Take 1,000 Units by mouth daily. Reported on 12/23/2015     No facility-administered medications prior to visit.    ROS Review of Systems  Constitutional: Negative for appetite change, fatigue and unexpected weight change.  HENT: Negative for congestion, nosebleeds, sneezing, sore throat and trouble swallowing.   Eyes: Positive for visual disturbance. Negative for itching.  Respiratory: Negative for cough.   Cardiovascular: Negative for chest pain, palpitations and leg swelling.  Gastrointestinal: Negative for nausea, diarrhea, blood in stool and abdominal distention.  Genitourinary:  Negative for frequency and hematuria.  Musculoskeletal: Positive for gait problem. Negative for back pain, joint swelling and neck pain.  Skin: Negative for rash.  Neurological: Negative for dizziness, tremors, speech difficulty and weakness.  Psychiatric/Behavioral: Negative for suicidal ideas, sleep disturbance, dysphoric mood and agitation. The patient is not nervous/anxious.     Objective:  BP 150/82 mmHg  Pulse 70  Temp(Src) 98.2 F (36.8 C) (Oral)  Resp 16  Ht 6' (1.829 m)  Wt 174 lb (78.926 kg)  BMI 23.59 kg/m2  SpO2 94%  BP Readings from Last 3 Encounters:  12/23/15 150/82  11/14/15 144/82  10/31/15 120/60    Wt Readings from Last 3 Encounters:  12/23/15 174 lb (78.926 kg)  11/14/15 170 lb (77.111 kg)  10/31/15 170 lb (77.111 kg)    Physical Exam  Constitutional: He is oriented to person, place, and time. He appears well-developed. No distress.  NAD  HENT:  Mouth/Throat: Oropharynx is clear and moist.  Eyes: Conjunctivae are normal. Pupils are equal, round, and reactive to light.  Neck: Normal range of motion. No JVD present. No thyromegaly present.  Cardiovascular: Normal rate, regular rhythm, normal heart sounds and intact distal pulses.  Exam reveals no gallop and no friction rub.   No murmur heard. Pulmonary/Chest: Effort normal and breath sounds normal. No respiratory distress. He has no wheezes. He has no rales. He exhibits no tenderness.  Abdominal: Soft. Bowel sounds are normal. He exhibits no distension and no mass. There is no tenderness.  There is no rebound and no guarding.  Musculoskeletal: Normal range of motion. He exhibits no edema or tenderness.  Lymphadenopathy:    He has no cervical adenopathy.  Neurological: He is alert and oriented to person, place, and time. He has normal reflexes. No cranial nerve deficit. He exhibits normal muscle tone. He displays a negative Romberg sign. Coordination abnormal. Gait normal.  Skin: Skin is warm and dry. No  rash noted.  Psychiatric: He has a normal mood and affect. His behavior is normal. Judgment and thought content normal.  cane Dark glasses Warts on face x2    Procedure Note :     Procedure : Cryosurgery   Indication:  Wart(s) x2    Risks including unsuccessful procedure , bleeding, infection, bruising, scar, a need for a repeat  procedure and others were explained to the patient in detail as well as the benefits. Informed consent was obtained verbally.   2  lesion(s)  on  face was/were treated with liquid nitrogen on a Q-tip in a usual fasion . Band-Aid was applied and antibiotic ointment was given for a later use.   Tolerated well. Complications none.   Postprocedure instructions :     Keep the wounds clean. You can wash them with liquid soap and water. Pat dry with gauze or a Kleenex tissue  Before applying antibiotic ointment and a Band-Aid.   You need to report immediately  if  any signs of infection develop.     Lab Results  Component Value Date   WBC 9.6 09/14/2015   HGB 13.0 09/14/2015   HCT 40.4 09/14/2015   PLT 219 09/14/2015   GLUCOSE 113* 09/14/2015   CHOL 210* 12/28/2013   TRIG 59.0 12/28/2013   HDL 63.40 12/28/2013   LDLCALC 135* 12/28/2013   ALT 12 08/26/2015   AST 20 08/26/2015   NA 141 09/14/2015   K 4.2 09/14/2015   CL 102 09/14/2015   CREATININE 1.02 09/14/2015   BUN 26* 09/14/2015   CO2 28 09/14/2015   TSH 1.46 08/26/2015   PSA 0.84 12/28/2013   INR 1.33 09/14/2015    US Breast Ltd Uni Left Inc Axilla  11/08/2015  CLINICAL DATA:  82 year old male presenting for evaluation of intermittent left nipple tenderness. EXAM: DIGITAL DIAGNOSTIC BILATERAL MAMMOGRAM WITH 3D TOMOSYNTHESIS AND CAD LEFT BREAST ULTRASOUND COMPARISON:  None. ACR Breast Density Category a: The breast tissue is almost entirely fatty. FINDINGS: In the left greater than right breast, there is flame shaped density central to the nipple, most consistent with gynecomastia. No discrete  masses are seen within this dense tissue. However, due to the abundance of this tissue in the left breast and the associated symptoms of left nipple tenderness, ultrasound will be performed for further evaluation. Mammographic images were processed with CAD. Physical exam of the left breast demonstrates firm tissue in the subareolar region, without discrete palpable mass. Exam of the contralateral breast demonstrates similar tissue. Ultrasound of the subareolar left breast demonstrates branching fibroglandular tissue without discrete mass or suspicious area of shadowing. The contralateral breast was imaged for comparison purposes, demonstrating similar but less abundant tissue. IMPRESSION: 1. Benign gynecomastia is identified in the left greater than right breast. This can be associated with pain. 2.  No mammographic evidence of malignancy in the bilateral breasts. RECOMMENDATION: Clinical follow-up recommended for the left greater than right gynecomastia and associated left nipple sensitivity. I have discussed the findings and recommendations with the patient. Results were also provided in writing at the  conclusion of the visit. If applicable, a reminder letter will be sent to the patient regarding the next appointment. BI-RADS CATEGORY  2: Benign. Electronically Signed   By: Ammie Ferrier M.D.   On: 11/08/2015 16:31   Mm Diag Breast Tomo Bilateral  11/08/2015  CLINICAL DATA:  82 year old male presenting for evaluation of intermittent left nipple tenderness. EXAM: DIGITAL DIAGNOSTIC BILATERAL MAMMOGRAM WITH 3D TOMOSYNTHESIS AND CAD LEFT BREAST ULTRASOUND COMPARISON:  None. ACR Breast Density Category a: The breast tissue is almost entirely fatty. FINDINGS: In the left greater than right breast, there is flame shaped density central to the nipple, most consistent with gynecomastia. No discrete masses are seen within this dense tissue. However, due to the abundance of this tissue in the left breast and the  associated symptoms of left nipple tenderness, ultrasound will be performed for further evaluation. Mammographic images were processed with CAD. Physical exam of the left breast demonstrates firm tissue in the subareolar region, without discrete palpable mass. Exam of the contralateral breast demonstrates similar tissue. Ultrasound of the subareolar left breast demonstrates branching fibroglandular tissue without discrete mass or suspicious area of shadowing. The contralateral breast was imaged for comparison purposes, demonstrating similar but less abundant tissue. IMPRESSION: 1. Benign gynecomastia is identified in the left greater than right breast. This can be associated with pain. 2.  No mammographic evidence of malignancy in the bilateral breasts. RECOMMENDATION: Clinical follow-up recommended for the left greater than right gynecomastia and associated left nipple sensitivity. I have discussed the findings and recommendations with the patient. Results were also provided in writing at the conclusion of the visit. If applicable, a reminder letter will be sent to the patient regarding the next appointment. BI-RADS CATEGORY  2: Benign. Electronically Signed   By: Ammie Ferrier M.D.   On: 11/08/2015 16:31    Assessment & Plan:   Diagnoses and all orders for this visit:  Viral warts  I am having Mr. Kerker maintain his EPINEPHrine, Cholecalciferol, levothyroxine, carvedilol, apixaban, Ascorbic Acid (VITAMIN C PO), irbesartan, diphenhydrAMINE, and torsemide.  No orders of the defined types were placed in this encounter.     Follow-up: Return in about 3 months (around 03/23/2016) for a follow-up visit.  Walker Kehr, MD

## 2015-12-23 NOTE — Assessment & Plan Note (Signed)
See Cryo 

## 2015-12-24 NOTE — Assessment & Plan Note (Signed)
Eliquis, Coreg

## 2015-12-24 NOTE — Assessment & Plan Note (Signed)
Coreg, Demadex, Losartan 

## 2015-12-24 NOTE — Assessment & Plan Note (Signed)
Synthroid 

## 2015-12-27 ENCOUNTER — Ambulatory Visit (INDEPENDENT_AMBULATORY_CARE_PROVIDER_SITE_OTHER): Payer: PPO | Admitting: Internal Medicine

## 2015-12-27 ENCOUNTER — Encounter: Payer: Self-pay | Admitting: Internal Medicine

## 2015-12-27 VITALS — BP 130/70 | HR 68 | Ht 72.0 in | Wt 172.4 lb

## 2015-12-27 DIAGNOSIS — I442 Atrioventricular block, complete: Secondary | ICD-10-CM | POA: Diagnosis not present

## 2015-12-27 DIAGNOSIS — I482 Chronic atrial fibrillation, unspecified: Secondary | ICD-10-CM

## 2015-12-27 LAB — CUP PACEART INCLINIC DEVICE CHECK
Battery Impedance: 10400 Ohm
Battery Voltage: 2.68 V
Implantable Lead Implant Date: 19980824
Implantable Lead Location: 753860
Lead Channel Impedance Value: 408 Ohm
Lead Channel Pacing Threshold Amplitude: 0.5 V
Lead Channel Pacing Threshold Pulse Width: 0.8 ms
Lead Channel Setting Pacing Pulse Width: 0.8 ms
Lead Channel Setting Sensing Sensitivity: 4 mV
MDC IDC LEAD IMPLANT DT: 19980824
MDC IDC LEAD LOCATION: 753859
MDC IDC MSMT BATTERY REMAINING LONGEVITY: 15
MDC IDC SESS DTM: 20170411174531
Pulse Gen Model: 5816
Pulse Gen Serial Number: 1647680

## 2015-12-27 NOTE — Patient Instructions (Signed)
Medication Instructions: - Your physician recommends that you continue on your current medications as directed. Please refer to the Current Medication list given to you today.  Labwork: - none  Procedures/Testing: - none  Follow-Up: - Your physician wants you to follow-up in: 6 month with the [facility] 1 year with Dr. [name]. You will receive a reminder letter in the mail two months in advance. If you don't receive a letter, please call our office to schedule the follow-up appointment.  Any Additional Special Instructions Will Be Listed Below (If Applicable).     If you need a refill on your cardiac medications before your next appointment, please call your pharmacy.

## 2015-12-27 NOTE — Progress Notes (Signed)
Patient Care Team: Cassandria Anger, MD as PCP - General Deboraha Sprang, MD (Cardiology) Clarene Essex, MD as Consulting Physician (Gastroenterology)   HPI  Ricky [name] is a 82 y.o. male is seen in followup for atrial fibrillation that is permanent. He is status post AV junction ablation following 2 failed Pulmonary Vein ablation procedures. He is status post pacemaker implantation and is device dependent.   He now walks with a cane for stability. He is interested in discussing NOACs.  Marland Kitchen  He is currently feeling better   Past Medical History  Diagnosis Date  . HYPOTHYROIDISM 09/04/2007  . ANXIETY 04/26/2008  . Essential hypertension, benign 10/03/2007  . LYMPHADENOPATHY 04/06/2009  . TOBACCO USE, QUIT 07/14/2009  . Atrial fibrillation ([place]) 10/31/2010    Coumadin therapy;  Echo 6/12: EF 50-55%, moderate MR, moderate TR, PASP 52, mild LAE  . Pacemaker     Status post AV nodal ablation  . Nocturia   . BPH (benign prostatic hypertrophy)   . HTN (hypertension) 06/07/2013    Past Surgical History  Procedure Laterality Date  . Pacemaker insertion  10/16/05  . Colonoscopy    . Eye surgery  2000    eye muscle release  . Eye surgery      both cataracts  . Inguinal hernia repair Right 05/06/2013    Procedure: HERNIA REPAIR INGUINAL ADULT;  Surgeon: Haywood Lasso, MD;  Location: [facility];  Service: General;  Laterality: Right;  . Hernia repair      Current Outpatient Prescriptions  Medication Sig Dispense Refill  . apixaban (ELIQUIS) 5 MG TABS tablet Take 1 tablet (5 mg total) by mouth 2 (two) times daily. 180 tablet 3  . Ascorbic Acid (VITAMIN C PO) Take 1 tablet by mouth daily.    . carvedilol (COREG) 25 MG tablet Take 1 tablet (25 mg total) by mouth 2 (two) times daily. 180 tablet 3  . Cholecalciferol 1000 UNITS tablet Take 1,000 Units by mouth daily. Reported on 12/23/2015    . diphenhydrAMINE (BENADRYL) 25 mg capsule Take 1-2 capsules (25-50  mg total) by mouth every 4 (four) hours as needed for itching or allergies (1-2 for bee stings). 60 capsule 2  . EPINEPHrine (EPIPEN 2-PAK) 0.3 mg/0.3 mL DEVI Inject 0.3 mg into the muscle daily as needed (allergic reaction). For allergic reaction    . irbesartan (AVAPRO) 300 MG tablet TAKE ONE TABLET BY MOUTH AT BEDTIME (Patient taking differently: Take 150 mg by mouth daily. ) 90 tablet 3  . levothyroxine (SYNTHROID, LEVOTHROID) 75 MCG tablet Take 75 mcg by mouth daily.    Marland Kitchen torsemide (DEMADEX) 10 MG tablet Take 1 tablet (10 mg total) by mouth as directed. Take 2 by mouth every morning and 1 by mouth every evening. 270 tablet 1   No current facility-administered medications for this visit.    Allergies  Allergen Reactions  . Bee Venom Anaphylaxis  . Amiodarone Hcl     Weakness in muscles   . Amoxicillin     Rash  . Atorvastatin     unknown  . Benazepril Hcl     unknown  . Clonidine Hydrochloride     unknown  . Colesevelam     unknown  . Diltiazem Hcl     unknown  . Ezetimibe     Unknown   . Verapamil Other (See Comments)    NO REACTION LISTED    Review of Systems negative except from HPI  and PMH  Physical Exam BP 130/70 mmHg  Pulse 68  Ht 6' (1.829 m)  Wt 172 lb 6.4 oz (78.2 kg)  BMI 23.38 kg/m2  SpO2 97% Well developed and well nourished in no acute distress HENT normal E scleral and icterus clear Neck Supple JVP flat; carotids brisk and full Clear to ausculation  Regular rate and rhythm, no murmurs gallops or rub Soft with active bowel sounds No clubbing cyanosis  Edema Alert and oriented, grossly normal motor and sensory function Skin Warm and Dry  ECG demonstrates atrial fibrillation with ventricular pacing  Assessment and  Plan  Atrial fibrillation-permanent  Complete heart block s/p AV ablation  Pacemaker-[employer] The patient's device was interrogated and the information was fully reviewed.    . No changes were made in the programming.      Hypertension well controlled   No bleeding on apixoban and dose is correct

## 2015-12-30 ENCOUNTER — Encounter: Payer: PPO | Admitting: Internal Medicine

## 2016-01-02 ENCOUNTER — Encounter: Payer: Self-pay | Admitting: Internal Medicine

## 2016-03-07 ENCOUNTER — Ambulatory Visit: Payer: PPO | Admitting: Family

## 2016-03-07 ENCOUNTER — Telehealth: Payer: Self-pay | Admitting: Internal Medicine

## 2016-03-07 NOTE — Telephone Encounter (Signed)
PLEASE NOTE: All timestamps contained within this report are represented as [place] Standard Time. CONFIDENTIALTY NOTICE: This fax transmission is intended only for the addressee. It contains information that is legally privileged, confidential or otherwise protected from use or disclosure. If you are not the intended recipient, you are strictly prohibited from reviewing, disclosing, copying using or disseminating any of this information or taking any action in reliance on or regarding this information. If you have received this fax in error, please notify us immediately by telephone so that we can arrange for its return to [place]. Phone: [phone], Toll-Free: [phone], Fax: [phone] Page: 1 of 1 Call Id: [id number] [place] [facility] - Client [place] Patient Name: KYAW ZARN DOB: 1923/02/01 Initial Comment Caller having soreness across his left hip, thinks he figured out what it is. Yesterday afternoon he decided to scrub around the siding at the back door. Nurse Assessment Nurse: Dimas Chyle, RN, Dellis Filbert Date/Time Eilene Ghazi Time): 03/07/2016 12:54:24 PM Confirm and document reason for call. If symptomatic, describe symptoms. You must click the next button to save text entered. ---Caller having soreness across his left hip, thinks he figured out what it is. Yesterday afternoon he decided to scrub around the siding at the back door. Has the patient traveled out of the country within the last 30 days? ---No Does the patient have any new or worsening symptoms? ---Yes Will a triage be completed? ---Yes Related visit to physician within the last 2 weeks? ---No Does the PT have any chronic conditions? (i.e. diabetes, asthma, etc.) ---No Is this a behavioral health or substance abuse call? ---No Guidelines Guideline Title Affirmed Question Affirmed Notes Hip Pain [1] Hip pain AND [2] from overuse or strain (e.g., vigorous  activity, running) Final Disposition User Storla, RN, Dellis Filbert Disagree/Comply: Comply

## 2016-04-06 ENCOUNTER — Other Ambulatory Visit: Payer: Self-pay | Admitting: *Deleted

## 2016-04-06 MED ORDER — IRBESARTAN 300 MG PO TABS: ORAL_TABLET | ORAL | Status: DC

## 2016-04-16 ENCOUNTER — Ambulatory Visit (INDEPENDENT_AMBULATORY_CARE_PROVIDER_SITE_OTHER): Payer: PPO

## 2016-04-16 VITALS — BP 160/88 | HR 65 | Ht 66.5 in | Wt 169.0 lb

## 2016-04-16 DIAGNOSIS — Z0000 Encounter for general adult medical examination without abnormal findings: Secondary | ICD-10-CM

## 2016-04-16 NOTE — Patient Instructions (Addendum)
Ricky [name] , Thank you for taking time to come for your Medicare Wellness Visit. I appreciate your ongoing commitment to your health goals. Please review the following plan we discussed and let me know if I can assist you in the future.   Keep doing what your doing!!   These are the goals we discussed: Goals    . <enter goal here>          Will increase biking as he can; does not overdo!    . Exercise 150 minutes per week (moderate activity)    . patient (pt-stated)          Wants to continue current exercise regiment; eat healthy and to maintain his health       This is a list of the screening recommended for you and due dates:  Health Maintenance  Topic Date Due  . Flu Shot  04/17/2016  . Tetanus Vaccine  01/11/2020  . Shingles Vaccine  Completed  . Pneumonia vaccines  Completed       Fall Prevention in the Home  Falls can cause injuries. They can happen to people of all ages. There are many things you can do to make your home safe and to help prevent falls.  WHAT CAN I DO ON THE OUTSIDE OF MY HOME?  Regularly fix the edges of walkways and driveways and fix any cracks.  Remove anything that might make you trip as you walk through a door, such as a raised step or threshold.  Trim any bushes or trees on the path to your home.  Use bright outdoor lighting.  Clear any walking paths of anything that might make someone trip, such as rocks or tools.  Regularly check to see if handrails are loose or broken. Make sure that both sides of any steps have handrails.  Any raised decks and porches should have guardrails on the edges.  Have any leaves, snow, or ice cleared regularly.  Use sand or salt on walking paths during winter.  Clean up any spills in your garage right away. This includes oil or grease spills. WHAT CAN I DO IN THE BATHROOM?   Use night lights.  Install grab bars by the toilet and in the tub and shower. Do not use towel bars as grab bars.  Use  non-skid mats or decals in the tub or shower.  If you need to sit down in the shower, use a plastic, non-slip stool.  Keep the floor dry. Clean up any water that spills on the floor as soon as it happens.  Remove soap buildup in the tub or shower regularly.  Attach bath mats securely with double-sided non-slip rug tape.  Do not have throw rugs and other things on the floor that can make you trip. WHAT CAN I DO IN THE BEDROOM?  Use night lights.  Make sure that you have a light by your bed that is easy to reach.  Do not use any sheets or blankets that are too big for your bed. They should not hang down onto the floor.  Have a firm chair that has side arms. You can use this for support while you get dressed.  Do not have throw rugs and other things on the floor that can make you trip. WHAT CAN I DO IN THE KITCHEN?  Clean up any spills right away.  Avoid walking on wet floors.  Keep items that you use a lot in easy-to-reach places.  If you need to reach  something above you, use a strong step stool that has a grab bar.  Keep electrical cords out of the way.  Do not use floor polish or wax that makes floors slippery. If you must use wax, use non-skid floor wax.  Do not have throw rugs and other things on the floor that can make you trip. WHAT CAN I DO WITH MY STAIRS?  Do not leave any items on the stairs.  Make sure that there are handrails on both sides of the stairs and use them. Fix handrails that are broken or loose. Make sure that handrails are as long as the stairways.  Check any carpeting to make sure that it is firmly attached to the stairs. Fix any carpet that is loose or worn.  Avoid having throw rugs at the top or bottom of the stairs. If you do have throw rugs, attach them to the floor with carpet tape.  Make sure that you have a light switch at the top of the stairs and the bottom of the stairs. If you do not have them, ask someone to add them for you. WHAT  ELSE CAN I DO TO HELP PREVENT FALLS?  Wear shoes that:  Do not have high heels.  Have rubber bottoms.  Are comfortable and fit you well.  Are closed at the toe. Do not wear sandals.  If you use a stepladder:  Make sure that it is fully opened. Do not climb a closed stepladder.  Make sure that both sides of the stepladder are locked into place.  Ask someone to hold it for you, if possible.  Clearly mark and make sure that you can see:  Any grab bars or handrails.  First and last steps.  Where the edge of each step is.  Use tools that help you move around (mobility aids) if they are needed. These include:  Canes.  Walkers.  Scooters.  Crutches.  Turn on the lights when you go into a dark area. Replace any light bulbs as soon as they burn out.  Set up your furniture so you have a clear path. Avoid moving your furniture around.  If any of your floors are uneven, fix them.  If there are any pets around you, be aware of where they are.  Review your medicines with your doctor. Some medicines can make you feel dizzy. This can increase your chance of falling. Ask your doctor what other things that you can do to help prevent falls.   This information is not intended to replace advice given to you by your health care provider. Make sure you discuss any questions you have with your health care provider.   Document Released: 06/30/2009 Document Revised: 01/18/2015 Document Reviewed: 10/08/2014 Elsevier Interactive Patient Education 2016 [place] Maintenance, Male A healthy lifestyle and preventative care can promote health and wellness.  Maintain regular health, dental, and eye exams.  Eat a healthy diet. Foods like vegetables, fruits, whole grains, low-fat dairy products, and lean protein foods contain the nutrients you need and are low in calories. Decrease your intake of foods high in solid fats, added sugars, and salt. Get information about a proper  diet from your health care provider, if necessary.  Regular physical exercise is one of the most important things you can do for your health. Most adults should get at least 150 minutes of moderate-intensity exercise (any activity that increases your heart rate and causes you to sweat) each week. In addition, most adults need  muscle-strengthening exercises on 2 or more days a week.   Maintain a healthy weight. The body mass index (BMI) is a screening tool to identify possible weight problems. It provides an estimate of body fat based on height and weight. Your health care provider can find your BMI and can help you achieve or maintain a healthy weight. For males 20 years and older:  A BMI below 18.5 is considered underweight.  A BMI of 18.5 to 24.9 is normal.  A BMI of 25 to 29.9 is considered overweight.  A BMI of 30 and above is considered obese.  Maintain normal blood lipids and cholesterol by exercising and minimizing your intake of saturated fat. Eat a balanced diet with plenty of fruits and vegetables. Blood tests for lipids and cholesterol should begin at age 61 and be repeated every 5 years. If your lipid or cholesterol levels are high, you are over age 70, or you are at high risk for heart disease, you may need your cholesterol levels checked more frequently.Ongoing high lipid and cholesterol levels should be treated with medicines if diet and exercise are not working.  If you smoke, find out from your health care provider how to quit. If you do not use tobacco, do not start.  Lung cancer screening is recommended for adults aged 34-80 years who are at high risk for developing lung cancer because of a history of smoking. A yearly low-dose CT scan of the lungs is recommended for people who have at least a 30-pack-year history of smoking and are current smokers or have quit within the past 15 years. A pack year of smoking is smoking an average of 1 pack of cigarettes a day for 1 year (for  example, a 30-pack-year history of smoking could mean smoking 1 pack a day for 30 years or 2 packs a day for 15 years). Yearly screening should continue until the smoker has stopped smoking for at least 15 years. Yearly screening should be stopped for people who develop a health problem that would prevent them from having lung cancer treatment.  If you choose to drink alcohol, do not have more than 2 drinks per day. One drink is considered to be 12 oz (360 mL) of beer, 5 oz (150 mL) of wine, or 1.5 oz (45 mL) of liquor.  Avoid the use of street drugs. Do not share needles with anyone. Ask for help if you need support or instructions about stopping the use of drugs.  High blood pressure causes heart disease and increases the risk of stroke. High blood pressure is more likely to develop in:  People who have blood pressure in the end of the normal range (100-139/85-89 mm Hg).  People who are overweight or obese.  People who are African American.  If you are 64-40 years of age, have your blood pressure checked every 3-5 years. If you are 81 years of age or older, have your blood pressure checked every year. You should have your blood pressure measured twice--once when you are at a hospital or clinic, and once when you are not at a hospital or clinic. Record the average of the two measurements. To check your blood pressure when you are not at a hospital or clinic, you can use:  An automated blood pressure machine at a pharmacy.  A home blood pressure monitor.  If you are 53-53 years old, ask your health care provider if you should take aspirin to prevent heart disease.  Diabetes screening involves taking  a blood sample to check your fasting blood sugar level. This should be done once every 3 years after age 67 if you are at a normal weight and without risk factors for diabetes. Testing should be considered at a younger age or be carried out more frequently if you are overweight and have at least 1  risk factor for diabetes.  Colorectal cancer can be detected and often prevented. Most routine colorectal cancer screening begins at the age of 63 and continues through age 85. However, your health care provider may recommend screening at an earlier age if you have risk factors for colon cancer. On a yearly basis, your health care provider may provide home test kits to check for hidden blood in the stool. A small camera at the end of a tube may be used to directly examine the colon (sigmoidoscopy or colonoscopy) to detect the earliest forms of colorectal cancer. Talk to your health care provider about this at age 54 when routine screening begins. A direct exam of the colon should be repeated every 5-10 years through age 29, unless early forms of precancerous polyps or small growths are found.  People who are at an increased risk for hepatitis B should be screened for this virus. You are considered at high risk for hepatitis B if:  You were born in a country where hepatitis B occurs often. Talk with your health care provider about which countries are considered high risk.  Your parents were born in a high-risk country and you have not received a shot to protect against hepatitis B (hepatitis B vaccine).  You have HIV or AIDS.  You use needles to inject street drugs.  You live with, or have sex with, someone who has hepatitis B.  You are a man who has sex with other men (MSM).  You get hemodialysis treatment.  You take certain medicines for conditions like cancer, organ transplantation, and autoimmune conditions.  Hepatitis C blood testing is recommended for all people born from [birth year] through 1963 and any individual with known risk factors for hepatitis C.  Healthy men should no longer receive prostate-specific antigen (PSA) blood tests as part of routine cancer screening. Talk to your health care provider about prostate cancer screening.  Testicular cancer screening is not recommended for  adolescents or adult males who have no symptoms. Screening includes self-exam, a health care provider exam, and other screening tests. Consult with your health care provider about any symptoms you have or any concerns you have about testicular cancer.  Practice safe sex. Use condoms and avoid high-risk sexual practices to reduce the spread of sexually transmitted infections (STIs).  You should be screened for STIs, including gonorrhea and chlamydia if:  You are sexually active and are younger than 24 years.  You are older than 24 years, and your health care provider tells you that you are at risk for this type of infection.  Your sexual activity has changed since you were last screened, and you are at an increased risk for chlamydia or gonorrhea. Ask your health care provider if you are at risk.  If you are at risk of being infected with HIV, it is recommended that you take a prescription medicine daily to prevent HIV infection. This is called pre-exposure prophylaxis (PrEP). You are considered at risk if:  You are a man who has sex with other men (MSM).  You are a heterosexual man who is sexually active with multiple partners.  You take drugs by injection.  You are sexually active with a partner who has HIV.  Talk with your health care provider about whether you are at high risk of being infected with HIV. If you choose to begin PrEP, you should first be tested for HIV. You should then be tested every 3 months for as long as you are taking PrEP.  Use sunscreen. Apply sunscreen liberally and repeatedly throughout the day. You should seek shade when your shadow is shorter than you. Protect yourself by wearing long sleeves, pants, a wide-brimmed hat, and sunglasses year round whenever you are outdoors.  Tell your health care provider of new moles or changes in moles, especially if there is a change in shape or color. Also, tell your health care provider if a mole is larger than the size of a  pencil eraser.  A one-time screening for abdominal aortic aneurysm (AAA) and surgical repair of large AAAs by ultrasound is recommended for men aged 71-75 years who are current or former smokers.  Stay current with your vaccines (immunizations).   This information is not intended to replace advice given to you by your health care provider. Make sure you discuss any questions you have with your health care provider.   Document Released: 03/01/2008 Document Revised: 09/24/2014 Document Reviewed: 01/29/2011 Elsevier Interactive Patient Education [employer].
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
## 2016-04-27 ENCOUNTER — Other Ambulatory Visit (INDEPENDENT_AMBULATORY_CARE_PROVIDER_SITE_OTHER): Payer: PPO

## 2016-04-27 ENCOUNTER — Encounter: Payer: Self-pay | Admitting: Internal Medicine

## 2016-04-27 ENCOUNTER — Ambulatory Visit (INDEPENDENT_AMBULATORY_CARE_PROVIDER_SITE_OTHER): Payer: PPO | Admitting: Internal Medicine

## 2016-04-27 VITALS — BP 160/90 | HR 64 | Wt 171.0 lb

## 2016-04-27 DIAGNOSIS — Z23 Encounter for immunization: Secondary | ICD-10-CM | POA: Diagnosis not present

## 2016-04-27 DIAGNOSIS — I10 Essential (primary) hypertension: Secondary | ICD-10-CM | POA: Diagnosis not present

## 2016-04-27 DIAGNOSIS — I5020 Unspecified systolic (congestive) heart failure: Secondary | ICD-10-CM | POA: Diagnosis not present

## 2016-04-27 DIAGNOSIS — E02 Subclinical iodine-deficiency hypothyroidism: Secondary | ICD-10-CM | POA: Diagnosis not present

## 2016-04-27 DIAGNOSIS — E039 Hypothyroidism, unspecified: Secondary | ICD-10-CM

## 2016-04-27 DIAGNOSIS — L570 Actinic keratosis: Secondary | ICD-10-CM | POA: Diagnosis not present

## 2016-04-27 LAB — BASIC METABOLIC PANEL
BUN: 24 mg/dL — AB (ref 6–23)
CHLORIDE: 99 meq/L (ref 96–112)
CO2: 34 meq/L — AB (ref 19–32)
CREATININE: 0.99 mg/dL (ref 0.40–1.50)
Calcium: 9.3 mg/dL (ref 8.4–10.5)
GFR: 75.35 mL/min (ref >60.00)
Glucose, Bld: 94 mg/dL (ref 70–99)
POTASSIUM: 4.9 meq/L (ref 3.5–5.1)
Sodium: 136 mEq/L (ref 135–145)

## 2016-04-27 LAB — HEPATIC FUNCTION PANEL
ALBUMIN: 4.1 g/dL (ref 3.5–5.2)
ALT: 14 U/L (ref 0–53)
AST: 20 U/L (ref 0–37)
Alkaline Phosphatase: 74 U/L (ref 39–117)
Bilirubin, Direct: 0.1 mg/dL (ref 0.0–0.3)
TOTAL PROTEIN: 6.9 g/dL (ref 6.0–8.3)
Total Bilirubin: 0.6 mg/dL (ref 0.2–1.2)

## 2016-04-27 LAB — CBC WITH DIFFERENTIAL/PLATELET
BASOS PCT: 0.4 % (ref 0.0–3.0)
Basophils Absolute: 0 10*3/uL (ref 0.0–0.1)
EOS PCT: 2.3 % (ref 0.0–5.0)
Eosinophils Absolute: 0.1 10*3/uL (ref 0.0–0.7)
HEMATOCRIT: 39.7 % (ref 39.0–52.0)
HEMOGLOBIN: 13.4 g/dL (ref 13.0–17.0)
LYMPHS PCT: 26.8 % (ref 12.0–46.0)
Lymphs Abs: 1.7 10*3/uL (ref 0.7–4.0)
MCHC: 33.8 g/dL (ref 30.0–36.0)
MCV: 99.8 fl (ref 78.0–100.0)
Monocytes Absolute: 0.6 10*3/uL (ref 0.1–1.0)
Monocytes Relative: 9 % (ref 3.0–12.0)
NEUTROS ABS: 3.9 10*3/uL (ref 1.4–7.7)
Neutrophils Relative %: 61.5 % (ref 43.0–77.0)
PLATELETS: 238 10*3/uL (ref 150.0–400.0)
RBC: 3.97 Mil/uL — ABNORMAL LOW (ref 4.22–5.81)
RDW: 13.6 % (ref 11.5–15.5)
WBC: 6.4 10*3/uL (ref 4.0–10.5)

## 2016-04-27 LAB — TSH: TSH: 1.12 u[IU]/mL (ref 0.35–4.50)

## 2016-04-27 NOTE — Patient Instructions (Signed)
   Postprocedure instructions :     Keep the wounds clean. You can wash them with liquid soap and water. Pat dry with gauze or a Kleenex tissue  Before applying antibiotic ointment and a Band-Aid.   You need to report immediately  if  any signs of infection develop.    

## 2016-04-27 NOTE — Addendum Note (Signed)
Addended by: Ander Slade on: 04/27/2016 02:35 PM   Modules accepted: Orders

## 2016-04-27 NOTE — Assessment & Plan Note (Signed)
Coreg, Demadex, Losartan 

## 2016-04-27 NOTE — Progress Notes (Signed)
Pre visit review using our clinic review tool, if applicable. No additional management support is needed unless otherwise documented below in the visit note. 

## 2016-04-27 NOTE — Assessment & Plan Note (Signed)
Coreg, Demadex, Eliquis, Losartan 

## 2016-04-27 NOTE — Progress Notes (Signed)
Subjective:  Patient ID: Ricky [name], male    DOB: 12-Mar-1923  Age: 82 y.o. MRN: [id number]  CC: No chief complaint on file.   HPI SABAS SABET presents for A fib, HTN, AKs f/u. BP is nl at home  Outpatient Medications Prior to Visit  Medication Sig Dispense Refill  . apixaban (ELIQUIS) 5 MG TABS tablet Take 1 tablet (5 mg total) by mouth 2 (two) times daily. 180 tablet 3  . Ascorbic Acid (VITAMIN C PO) Take 1 tablet by mouth daily.    . Cholecalciferol 1000 UNITS tablet Take 1,000 Units by mouth daily. Reported on 12/23/2015    . diphenhydrAMINE (BENADRYL) 25 mg capsule Take 1-2 capsules (25-50 mg total) by mouth every 4 (four) hours as needed for itching or allergies (1-2 for bee stings). 60 capsule 2  . EPINEPHrine (EPIPEN 2-PAK) 0.3 mg/0.3 mL DEVI Inject 0.3 mg into the muscle daily as needed (allergic reaction). For allergic reaction    . irbesartan (AVAPRO) 300 MG tablet TAKE ONE TABLET BY MOUTH AT BEDTIME 90 tablet 1  . levothyroxine (SYNTHROID, LEVOTHROID) 75 MCG tablet Take 75 mcg by mouth daily.    Marland Kitchen torsemide (DEMADEX) 10 MG tablet Take 1 tablet (10 mg total) by mouth as directed. Take 2 by mouth every morning and 1 by mouth every evening. 270 tablet 1  . carvedilol (COREG) 25 MG tablet Take 1 tablet (25 mg total) by mouth 2 (two) times daily. 180 tablet 3   No facility-administered medications prior to visit.     ROS Review of Systems  Constitutional: Negative for appetite change, fatigue and unexpected weight change.  HENT: Negative for congestion, nosebleeds, sneezing, sore throat and trouble swallowing.   Eyes: Negative for itching and visual disturbance.  Respiratory: Negative for cough.   Cardiovascular: Negative for chest pain, palpitations and leg swelling.  Gastrointestinal: Negative for abdominal distention, blood in stool, diarrhea and nausea.  Genitourinary: Negative for frequency and hematuria.  Musculoskeletal: Negative for back pain, gait problem,  joint swelling and neck pain.  Skin: Negative for rash.  Neurological: Negative for dizziness, tremors, speech difficulty and weakness.  Psychiatric/Behavioral: Negative for agitation, dysphoric mood and sleep disturbance. The patient is not nervous/anxious.     Objective:  BP (!) 160/90   Pulse 64   Wt 171 lb (77.6 kg)   SpO2 97%   BMI 27.19 kg/m   BP Readings from Last 3 Encounters:  04/27/16 (!) 160/90  04/16/16 (!) 160/88  12/27/15 130/70    Wt Readings from Last 3 Encounters:  04/27/16 171 lb (77.6 kg)  04/16/16 169 lb (76.7 kg)  12/27/15 172 lb 6.4 oz (78.2 kg)    Physical Exam  Constitutional: He is oriented to person, place, and time. He appears well-developed. No distress.  NAD  HENT:  Mouth/Throat: Oropharynx is clear and moist.  Eyes: Conjunctivae are normal. Pupils are equal, round, and reactive to light.  Neck: Normal range of motion. No JVD present. No thyromegaly present.  Cardiovascular: Normal rate, regular rhythm, normal heart sounds and intact distal pulses.  Exam reveals no gallop and no friction rub.   No murmur heard. Pulmonary/Chest: Effort normal and breath sounds normal. No respiratory distress. He has no wheezes. He has no rales. He exhibits no tenderness.  Abdominal: Soft. Bowel sounds are normal. He exhibits no distension and no mass. There is no tenderness. There is no rebound and no guarding.  Musculoskeletal: Normal range of motion. He exhibits no edema or  tenderness.  Lymphadenopathy:    He has no cervical adenopathy.  Neurological: He is alert and oriented to person, place, and time. He has normal reflexes. No cranial nerve deficit. He exhibits normal muscle tone. He displays a negative Romberg sign. Coordination abnormal. Gait normal.  Skin: Skin is warm and dry. No rash noted.  Psychiatric: He has a normal mood and affect. His behavior is normal. Judgment and thought content normal.  cane   Procedure Note :     Procedure :  Cryosurgery   Indication:   Actinic keratosis(es)   Risks including unsuccessful procedure , bleeding, infection, bruising, scar, a need for a repeat  procedure and others were explained to the patient in detail as well as the benefits. Informed consent was obtained verbally.    4 lesion(s)  on face   was/were treated with liquid nitrogen on a Q-tip in a usual fasion . Band-Aid was applied and antibiotic ointment was given for a later use.   Tolerated well. Complications none.   Postprocedure instructions :     Keep the wounds clean. You can wash them with liquid soap and water. Pat dry with gauze or a Kleenex tissue  Before applying antibiotic ointment and a Band-Aid.   You need to report immediately  if  any signs of infection develop.     Lab Results  Component Value Date   WBC 9.6 09/14/2015   HGB 13.0 09/14/2015   HCT 40.4 09/14/2015   PLT 219 09/14/2015   GLUCOSE 113 (H) 09/14/2015   CHOL 210 (H) 12/28/2013   TRIG 59.0 12/28/2013   HDL 63.40 12/28/2013   LDLCALC 135 (H) 12/28/2013   ALT 12 08/26/2015   AST 20 08/26/2015   NA 141 09/14/2015   K 4.2 09/14/2015   CL 102 09/14/2015   CREATININE 1.02 09/14/2015   BUN 26 (H) 09/14/2015   CO2 28 09/14/2015   TSH 1.46 08/26/2015   PSA 0.84 12/28/2013   INR 1.33 09/14/2015    US Breast Ltd Uni Left Inc Axilla  Result Date: 11/08/2015 CLINICAL DATA:  82 year old male presenting for evaluation of intermittent left nipple tenderness. EXAM: DIGITAL DIAGNOSTIC BILATERAL MAMMOGRAM WITH 3D TOMOSYNTHESIS AND CAD LEFT BREAST ULTRASOUND COMPARISON:  None. ACR Breast Density Category a: The breast tissue is almost entirely fatty. FINDINGS: In the left greater than right breast, there is flame shaped density central to the nipple, most consistent with gynecomastia. No discrete masses are seen within this dense tissue. However, due to the abundance of this tissue in the left breast and the associated symptoms of left nipple tenderness,  ultrasound will be performed for further evaluation. Mammographic images were processed with CAD. Physical exam of the left breast demonstrates firm tissue in the subareolar region, without discrete palpable mass. Exam of the contralateral breast demonstrates similar tissue. Ultrasound of the subareolar left breast demonstrates branching fibroglandular tissue without discrete mass or suspicious area of shadowing. The contralateral breast was imaged for comparison purposes, demonstrating similar but less abundant tissue. IMPRESSION: 1. Benign gynecomastia is identified in the left greater than right breast. This can be associated with pain. 2.  No mammographic evidence of malignancy in the bilateral breasts. RECOMMENDATION: Clinical follow-up recommended for the left greater than right gynecomastia and associated left nipple sensitivity. I have discussed the findings and recommendations with the patient. Results were also provided in writing at the conclusion of the visit. If applicable, a reminder letter will be sent to the patient regarding the next appointment.  BI-RADS CATEGORY  2: Benign. Electronically Signed   By: Ammie Ferrier M.D.   On: 11/08/2015 16:31   Mm Diag Breast Tomo Bilateral  Result Date: 11/08/2015 CLINICAL DATA:  82 year old male presenting for evaluation of intermittent left nipple tenderness. EXAM: DIGITAL DIAGNOSTIC BILATERAL MAMMOGRAM WITH 3D TOMOSYNTHESIS AND CAD LEFT BREAST ULTRASOUND COMPARISON:  None. ACR Breast Density Category a: The breast tissue is almost entirely fatty. FINDINGS: In the left greater than right breast, there is flame shaped density central to the nipple, most consistent with gynecomastia. No discrete masses are seen within this dense tissue. However, due to the abundance of this tissue in the left breast and the associated symptoms of left nipple tenderness, ultrasound will be performed for further evaluation. Mammographic images were processed with CAD.  Physical exam of the left breast demonstrates firm tissue in the subareolar region, without discrete palpable mass. Exam of the contralateral breast demonstrates similar tissue. Ultrasound of the subareolar left breast demonstrates branching fibroglandular tissue without discrete mass or suspicious area of shadowing. The contralateral breast was imaged for comparison purposes, demonstrating similar but less abundant tissue. IMPRESSION: 1. Benign gynecomastia is identified in the left greater than right breast. This can be associated with pain. 2.  No mammographic evidence of malignancy in the bilateral breasts. RECOMMENDATION: Clinical follow-up recommended for the left greater than right gynecomastia and associated left nipple sensitivity. I have discussed the findings and recommendations with the patient. Results were also provided in writing at the conclusion of the visit. If applicable, a reminder letter will be sent to the patient regarding the next appointment. BI-RADS CATEGORY  2: Benign. Electronically Signed   By: Ammie Ferrier M.D.   On: 11/08/2015 16:31    Assessment & Plan:   There are no diagnoses linked to this encounter. I am having Mr. Klym maintain his EPINEPHrine, Cholecalciferol, levothyroxine, carvedilol, apixaban, Ascorbic Acid (VITAMIN C PO), diphenhydrAMINE, torsemide, and irbesartan.  No orders of the defined types were placed in this encounter.    Follow-up: No Follow-up on file.  Walker Kehr, MD

## 2016-04-27 NOTE — Assessment & Plan Note (Signed)
Cryo x4 

## 2016-04-27 NOTE — Assessment & Plan Note (Signed)
On Levothroid 

## 2016-06-25 ENCOUNTER — Ambulatory Visit (INDEPENDENT_AMBULATORY_CARE_PROVIDER_SITE_OTHER): Payer: PPO | Admitting: *Deleted

## 2016-06-25 DIAGNOSIS — I442 Atrioventricular block, complete: Secondary | ICD-10-CM

## 2016-06-25 NOTE — Progress Notes (Signed)
Pacemaker check in clinic. Normal device function. Thresholds and impedance consistent with previous measurements. Device programmed to maximize longevity. No high ventricular rates noted. Device programmed at appropriate safety margins. Histogram distribution appropriate for patient activity level. Device programmed to optimize intrinsic conduction. Estimated longevity 0.25-0.50years. ROV with device clinic 07/26/2016 for battery check. Patient education completed

## 2016-07-26 ENCOUNTER — Ambulatory Visit (INDEPENDENT_AMBULATORY_CARE_PROVIDER_SITE_OTHER): Payer: PPO | Admitting: *Deleted

## 2016-07-26 DIAGNOSIS — I482 Chronic atrial fibrillation, unspecified: Secondary | ICD-10-CM

## 2016-07-26 DIAGNOSIS — I442 Atrioventricular block, complete: Secondary | ICD-10-CM

## 2016-07-26 LAB — CUP PACEART INCLINIC DEVICE CHECK
Battery Impedance: 24000 Ohm
Battery Remaining Longevity: 3 mo
Battery Voltage: 2.56 V
Implantable Lead Implant Date: 19980824
Implantable Lead Implant Date: 19980824
Implantable Lead Location: 753859
Implantable Pulse Generator Implant Date: 20070130
MDC IDC LEAD LOCATION: 753860
MDC IDC MSMT LEADCHNL RV IMPEDANCE VALUE: 381 Ohm
MDC IDC MSMT LEADCHNL RV PACING THRESHOLD AMPLITUDE: 0.375 V
MDC IDC MSMT LEADCHNL RV PACING THRESHOLD PULSEWIDTH: 0.8 ms
MDC IDC PG SERIAL: 1647680
MDC IDC SESS DTM: 20171109143026
MDC IDC SET LEADCHNL RV PACING PULSEWIDTH: 0.8 ms
MDC IDC SET LEADCHNL RV SENSING SENSITIVITY: 4 mV
Pulse Gen Model: 5816

## 2016-07-26 NOTE — Progress Notes (Signed)
Pacemaker check in clinic for battery estimate only (N/C). Estimated longevity 0.25 years. No changes made this session. Patient will follow up with the [facility] in 1 month.

## 2016-08-01 ENCOUNTER — Encounter: Payer: Self-pay | Admitting: Internal Medicine

## 2016-08-01 ENCOUNTER — Ambulatory Visit (INDEPENDENT_AMBULATORY_CARE_PROVIDER_SITE_OTHER): Payer: PPO | Admitting: Internal Medicine

## 2016-08-01 ENCOUNTER — Other Ambulatory Visit (INDEPENDENT_AMBULATORY_CARE_PROVIDER_SITE_OTHER): Payer: PPO

## 2016-08-01 DIAGNOSIS — I10 Essential (primary) hypertension: Secondary | ICD-10-CM | POA: Diagnosis not present

## 2016-08-01 DIAGNOSIS — I482 Chronic atrial fibrillation, unspecified: Secondary | ICD-10-CM

## 2016-08-01 DIAGNOSIS — I5020 Unspecified systolic (congestive) heart failure: Secondary | ICD-10-CM | POA: Diagnosis not present

## 2016-08-01 DIAGNOSIS — M545 Low back pain, unspecified: Secondary | ICD-10-CM

## 2016-08-01 LAB — BASIC METABOLIC PANEL
BUN: 25 mg/dL — AB (ref 6–23)
CHLORIDE: 99 meq/L (ref 96–112)
CO2: 35 meq/L — AB (ref 19–32)
CREATININE: 0.94 mg/dL (ref 0.40–1.50)
Calcium: 9.4 mg/dL (ref 8.4–10.5)
GFR: 79.95 mL/min (ref >60.00)
Glucose, Bld: 85 mg/dL (ref 70–99)
POTASSIUM: 4.9 meq/L (ref 3.5–5.1)
Sodium: 138 mEq/L (ref 135–145)

## 2016-08-01 NOTE — Assessment & Plan Note (Signed)
Normal BP at home Demadex, Coreg, Losartan

## 2016-08-01 NOTE — Assessment & Plan Note (Signed)
Coreg, Demadex, Eliquis, Losartan 

## 2016-08-01 NOTE — Assessment & Plan Note (Signed)
Eliquis, Coreg

## 2016-08-01 NOTE — Assessment & Plan Note (Signed)
Doing well 

## 2016-08-01 NOTE — Progress Notes (Signed)
Pre visit review using our clinic review tool, if applicable. No additional management support is needed unless otherwise documented below in the visit note. 

## 2016-08-01 NOTE — Progress Notes (Signed)
Subjective:  Patient ID: Ricky [name], male    DOB: 14-Apr-1923  Age: 82 y.o. MRN: [id number]  CC: No chief complaint on file.   HPI Ricky [name] presents for HTN (BP OK at home), A fib, hypothyroidism f/u  Outpatient Medications Prior to Visit  Medication Sig Dispense Refill  . apixaban (ELIQUIS) 5 MG TABS tablet Take 1 tablet (5 mg total) by mouth 2 (two) times daily. 180 tablet 3  . Ascorbic Acid (VITAMIN C PO) Take 1 tablet by mouth daily.    . Cholecalciferol 1000 UNITS tablet Take 1,000 Units by mouth daily. Reported on 12/23/2015    . diphenhydrAMINE (BENADRYL) 25 mg capsule Take 1-2 capsules (25-50 mg total) by mouth every 4 (four) hours as needed for itching or allergies (1-2 for bee stings). 60 capsule 2  . EPINEPHrine (EPIPEN 2-PAK) 0.3 mg/0.3 mL DEVI Inject 0.3 mg into the muscle daily as needed (allergic reaction). For allergic reaction    . irbesartan (AVAPRO) 300 MG tablet TAKE ONE TABLET BY MOUTH AT BEDTIME 90 tablet 1  . levothyroxine (SYNTHROID, LEVOTHROID) 75 MCG tablet Take 75 mcg by mouth daily.    Marland Kitchen torsemide (DEMADEX) 10 MG tablet Take 1 tablet (10 mg total) by mouth as directed. Take 2 by mouth every morning and 1 by mouth every evening. 270 tablet 1  . carvedilol (COREG) 25 MG tablet Take 1 tablet (25 mg total) by mouth 2 (two) times daily. 180 tablet 3   No facility-administered medications prior to visit.     ROS Review of Systems  Constitutional: Negative for appetite change, fatigue and unexpected weight change.  HENT: Negative for congestion, nosebleeds, sneezing, sore throat and trouble swallowing.   Eyes: Positive for visual disturbance. Negative for itching.  Respiratory: Negative for cough.   Cardiovascular: Negative for chest pain, palpitations and leg swelling.  Gastrointestinal: Negative for abdominal distention, blood in stool, diarrhea and nausea.  Genitourinary: Negative for frequency and hematuria.  Musculoskeletal: Positive for gait  problem. Negative for back pain, joint swelling and neck pain.  Skin: Negative for rash.  Neurological: Negative for dizziness, tremors, speech difficulty and weakness.  Psychiatric/Behavioral: Negative for agitation, dysphoric mood, sleep disturbance and suicidal ideas. The patient is not nervous/anxious.     Objective:  BP (!) 148/74   Pulse 66   Wt 168 lb (76.2 kg)   SpO2 96%   BMI 26.71 kg/m   BP Readings from Last 3 Encounters:  08/01/16 (!) 148/74  04/27/16 (!) 160/90  04/16/16 (!) 160/88    Wt Readings from Last 3 Encounters:  08/01/16 168 lb (76.2 kg)  04/27/16 171 lb (77.6 kg)  04/16/16 169 lb (76.7 kg)    Physical Exam  Constitutional: He is oriented to person, place, and time. He appears well-developed. No distress.  NAD  HENT:  Mouth/Throat: Oropharynx is clear and moist.  Eyes: Conjunctivae are normal. Pupils are equal, round, and reactive to light.  Neck: Normal range of motion. No JVD present. No thyromegaly present.  Cardiovascular: Normal rate, normal heart sounds and intact distal pulses.  Exam reveals no gallop and no friction rub.   No murmur heard. Pulmonary/Chest: Effort normal and breath sounds normal. No respiratory distress. He has no wheezes. He has no rales. He exhibits no tenderness.  Abdominal: Soft. Bowel sounds are normal. He exhibits no distension and no mass. There is no tenderness. There is no rebound and no guarding.  Musculoskeletal: Normal range of motion. He exhibits no edema  or tenderness.  Lymphadenopathy:    He has no cervical adenopathy.  Neurological: He is alert and oriented to person, place, and time. He has normal reflexes. No cranial nerve deficit. He exhibits normal muscle tone. He displays a negative Romberg sign. Coordination abnormal. Gait normal.  Skin: Skin is warm and dry. No rash noted.  Psychiatric: He has a normal mood and affect. His behavior is normal. Judgment and thought content normal.  sunglasses, cane irreg  irreg  Lab Results  Component Value Date   WBC 6.4 04/27/2016   HGB 13.4 04/27/2016   HCT 39.7 04/27/2016   PLT 238.0 04/27/2016   GLUCOSE 94 04/27/2016   CHOL 210 (H) 12/28/2013   TRIG 59.0 12/28/2013   HDL 63.40 12/28/2013   LDLCALC 135 (H) 12/28/2013   ALT 14 04/27/2016   AST 20 04/27/2016   NA 136 04/27/2016   K 4.9 04/27/2016   CL 99 04/27/2016   CREATININE 0.99 04/27/2016   BUN 24 (H) 04/27/2016   CO2 34 (H) 04/27/2016   TSH 1.12 04/27/2016   PSA 0.84 12/28/2013   INR 1.33 09/14/2015    US Breast Ltd Uni Left Inc Axilla  Result Date: 11/08/2015 CLINICAL DATA:  82 year old male presenting for evaluation of intermittent left nipple tenderness. EXAM: DIGITAL DIAGNOSTIC BILATERAL MAMMOGRAM WITH 3D TOMOSYNTHESIS AND CAD LEFT BREAST ULTRASOUND COMPARISON:  None. ACR Breast Density Category a: The breast tissue is almost entirely fatty. FINDINGS: In the left greater than right breast, there is flame shaped density central to the nipple, most consistent with gynecomastia. No discrete masses are seen within this dense tissue. However, due to the abundance of this tissue in the left breast and the associated symptoms of left nipple tenderness, ultrasound will be performed for further evaluation. Mammographic images were processed with CAD. Physical exam of the left breast demonstrates firm tissue in the subareolar region, without discrete palpable mass. Exam of the contralateral breast demonstrates similar tissue. Ultrasound of the subareolar left breast demonstrates branching fibroglandular tissue without discrete mass or suspicious area of shadowing. The contralateral breast was imaged for comparison purposes, demonstrating similar but less abundant tissue. IMPRESSION: 1. Benign gynecomastia is identified in the left greater than right breast. This can be associated with pain. 2.  No mammographic evidence of malignancy in the bilateral breasts. RECOMMENDATION: Clinical follow-up recommended  for the left greater than right gynecomastia and associated left nipple sensitivity. I have discussed the findings and recommendations with the patient. Results were also provided in writing at the conclusion of the visit. If applicable, a reminder letter will be sent to the patient regarding the next appointment. BI-RADS CATEGORY  2: Benign. Electronically Signed   By: Ammie Ferrier M.D.   On: 11/08/2015 16:31   Mm Diag Breast Tomo Bilateral  Result Date: 11/08/2015 CLINICAL DATA:  82 year old male presenting for evaluation of intermittent left nipple tenderness. EXAM: DIGITAL DIAGNOSTIC BILATERAL MAMMOGRAM WITH 3D TOMOSYNTHESIS AND CAD LEFT BREAST ULTRASOUND COMPARISON:  None. ACR Breast Density Category a: The breast tissue is almost entirely fatty. FINDINGS: In the left greater than right breast, there is flame shaped density central to the nipple, most consistent with gynecomastia. No discrete masses are seen within this dense tissue. However, due to the abundance of this tissue in the left breast and the associated symptoms of left nipple tenderness, ultrasound will be performed for further evaluation. Mammographic images were processed with CAD. Physical exam of the left breast demonstrates firm tissue in the subareolar region, without discrete  palpable mass. Exam of the contralateral breast demonstrates similar tissue. Ultrasound of the subareolar left breast demonstrates branching fibroglandular tissue without discrete mass or suspicious area of shadowing. The contralateral breast was imaged for comparison purposes, demonstrating similar but less abundant tissue. IMPRESSION: 1. Benign gynecomastia is identified in the left greater than right breast. This can be associated with pain. 2.  No mammographic evidence of malignancy in the bilateral breasts. RECOMMENDATION: Clinical follow-up recommended for the left greater than right gynecomastia and associated left nipple sensitivity. I have discussed  the findings and recommendations with the patient. Results were also provided in writing at the conclusion of the visit. If applicable, a reminder letter will be sent to the patient regarding the next appointment. BI-RADS CATEGORY  2: Benign. Electronically Signed   By: Ammie Ferrier M.D.   On: 11/08/2015 16:31    Assessment & Plan:   There are no diagnoses linked to this encounter. I am having Mr. Melody maintain his EPINEPHrine, Cholecalciferol, levothyroxine, carvedilol, apixaban, Ascorbic Acid (VITAMIN C PO), diphenhydrAMINE, torsemide, and irbesartan.  No orders of the defined types were placed in this encounter.    Follow-up: No Follow-up on file.  Walker Kehr, MD

## 2016-08-29 ENCOUNTER — Ambulatory Visit (INDEPENDENT_AMBULATORY_CARE_PROVIDER_SITE_OTHER): Payer: PPO | Admitting: *Deleted

## 2016-08-29 DIAGNOSIS — I442 Atrioventricular block, complete: Secondary | ICD-10-CM

## 2016-08-29 NOTE — Progress Notes (Signed)
Battery check. Remaining longevity 0.25 year. ROV 10/01/2016 with device clinic for battery check.

## 2016-10-01 ENCOUNTER — Ambulatory Visit (INDEPENDENT_AMBULATORY_CARE_PROVIDER_SITE_OTHER): Payer: Medicare HMO | Admitting: *Deleted

## 2016-10-01 DIAGNOSIS — Z45018 Encounter for adjustment and management of other part of cardiac pacemaker: Secondary | ICD-10-CM

## 2016-10-01 NOTE — Progress Notes (Signed)
Battery check- Device reached ERI 09/28/2016. ROV w/ SK 10/09/2016 to discuss gen change.

## 2016-10-09 ENCOUNTER — Ambulatory Visit (INDEPENDENT_AMBULATORY_CARE_PROVIDER_SITE_OTHER): Payer: Medicare HMO | Admitting: Internal Medicine

## 2016-10-09 ENCOUNTER — Encounter: Payer: Self-pay | Admitting: Internal Medicine

## 2016-10-09 VITALS — BP 150/80 | HR 86 | Ht 71.0 in | Wt 166.2 lb

## 2016-10-09 DIAGNOSIS — I482 Chronic atrial fibrillation, unspecified: Secondary | ICD-10-CM

## 2016-10-09 DIAGNOSIS — I442 Atrioventricular block, complete: Secondary | ICD-10-CM

## 2016-10-09 DIAGNOSIS — Z950 Presence of cardiac pacemaker: Secondary | ICD-10-CM

## 2016-10-09 NOTE — Patient Instructions (Addendum)
Medication Instructions: Your physician recommends that you continue on your current medications as directed. Please refer to the Current Medication list given to you today.   Labwork: Your physician recommends that you have lab work at the hospital the day of your procedure: BMET and CBC   Procedures/Testing: Your physician has recommended that you have a generator/ battery change of your Pacemaker.   - Wednesday 10/31/16 - arrive at 11:00 am  - Dr. [name] nurse will mail you a copy of your instructions   Follow-Up: - Your physician recommends that you schedule a follow-up appointment in: about 14 days from your procedure (10/31/16) with the [facility] for a wound check.   Any Additional Special Instructions Will Be Listed Below (If Applicable).     If you need a refill on your cardiac medications before your next appointment, please call your pharmacy.

## 2016-10-09 NOTE — Progress Notes (Signed)
Patient Care Team: Cassandria Anger, MD as PCP - General Deboraha Sprang, MD (Cardiology) Clarene Essex, MD as Consulting Physician (Gastroenterology)   HPI  Ricky [name] is a 83 y.o. male is seen in followup for atrial fibrillation that is permanent. He is status post AV junction ablation following 2 failed Pulmonary Vein ablation procedures. He is status post pacemaker implantation and is device dependent.   He has reached ERI   Now with some loss of functional status  He now walks with a cane for stability  Tolerating apixoban   .   r   Past Medical History:  Diagnosis Date  . ANXIETY 04/26/2008  . Atrial fibrillation ([place]) 10/31/2010   Coumadin therapy;  Echo 6/12: EF 50-55%, moderate MR, moderate TR, PASP 52, mild LAE  . BPH (benign prostatic hypertrophy)   . Essential hypertension, benign 10/03/2007  . HTN (hypertension) 06/07/2013  . HYPOTHYROIDISM 09/04/2007  . LYMPHADENOPATHY 04/06/2009  . Nocturia   . Pacemaker    Status post AV nodal ablation  . TOBACCO USE, QUIT 07/14/2009    Past Surgical History:  Procedure Laterality Date  . COLONOSCOPY    . EYE SURGERY  2000   eye muscle release  . EYE SURGERY     both cataracts  . HERNIA REPAIR    . INGUINAL HERNIA REPAIR Right 05/06/2013   Procedure: HERNIA REPAIR INGUINAL ADULT;  Surgeon: Haywood Lasso, MD;  Location: [facility];  Service: General;  Laterality: Right;  . PACEMAKER INSERTION  10/16/05    Current Outpatient Prescriptions  Medication Sig Dispense Refill  . apixaban (ELIQUIS) 5 MG TABS tablet Take 1 tablet (5 mg total) by mouth 2 (two) times daily. 180 tablet 3  . Ascorbic Acid (VITAMIN C PO) Take 1 tablet by mouth daily.    . Cholecalciferol 1000 UNITS tablet Take 1,000 Units by mouth daily. Reported on 12/23/2015    . diphenhydrAMINE (BENADRYL) 25 mg capsule Take 1-2 capsules (25-50 mg total) by mouth every 4 (four) hours as needed for itching or allergies (1-2 for bee  stings). 60 capsule 2  . EPINEPHrine (EPIPEN 2-PAK) 0.3 mg/0.3 mL DEVI Inject 0.3 mg into the muscle daily as needed (allergic reaction). For allergic reaction    . irbesartan (AVAPRO) 300 MG tablet TAKE ONE TABLET BY MOUTH AT BEDTIME 90 tablet 1  . levothyroxine (SYNTHROID, LEVOTHROID) 75 MCG tablet Take 75 mcg by mouth daily.    Marland Kitchen torsemide (DEMADEX) 10 MG tablet Take 1 tablet (10 mg total) by mouth as directed. Take 2 by mouth every morning and 1 by mouth every evening. 270 tablet 1  . carvedilol (COREG) 25 MG tablet Take 1 tablet (25 mg total) by mouth 2 (two) times daily. 180 tablet 3   No current facility-administered medications for this visit.     Allergies  Allergen Reactions  . Bee Venom Anaphylaxis  . Amiodarone Hcl     Weakness in muscles   . Amoxicillin     Rash  . Atorvastatin     unknown  . Benazepril Hcl     unknown  . Clonidine Hydrochloride     unknown  . Colesevelam     unknown  . Diltiazem Hcl     unknown  . Ezetimibe     Unknown   . Verapamil Other (See Comments)    NO REACTION LISTED    Review of Systems negative except from HPI and PMH  Physical Exam  BP (!) 150/80   Pulse 86   Ht 5\' 11"  (1.803 m)   Wt 166 lb 3.2 oz (75.4 kg)   SpO2 96%   BMI 23.18 kg/m  Well developed and well nourished in no acute distress HENT normal E scleral and icterus clear Neck Supple JVP flat; carotids brisk and full Clear to ausculation Device pocket well healed; without hematoma or erythema.  There is no tethering  With lead on top of device  Regular rate and rhythm, no murmurs gallops or rub Soft with active bowel sounds No clubbing cyanosis  Edema Alert and oriented, grossly normal motor and sensory function Skin Warm and Dry  ECG demonstrates atrial fibrillation with ventricular pacing  Assessment and  Plan  Atrial fibrillation-permanent  Complete heart block s/p AV ablation  Pacemaker-[employer] The patient's device was interrogated and the information  was fully reviewed.    . Will reprogram to bipolar  Hypertension well controlled   On Anticoagulation;  No bleeding issues   Needs generator replacement  We have reviewed the benefits and risks of generator replacement.  These include but are not limited to lead fracture and infection.  The patient understands, agrees and is willing to proceed.

## 2016-10-10 ENCOUNTER — Encounter: Payer: Self-pay | Admitting: *Deleted

## 2016-10-10 LAB — CUP PACEART INCLINIC DEVICE CHECK
Date Time Interrogation Session: 20180124075723
Implantable Lead Implant Date: 19980824
Implantable Lead Implant Date: 19980824
Implantable Lead Location: 753859
Implantable Pulse Generator Implant Date: 20070130
Lead Channel Pacing Threshold Amplitude: 0.5 V
MDC IDC LEAD LOCATION: 753860
MDC IDC MSMT LEADCHNL RV PACING THRESHOLD PULSEWIDTH: 0.8 ms
MDC IDC STAT BRADY RV PERCENT PACED: 99 %
Pulse Gen Model: 5816
Pulse Gen Serial Number: 1647680

## 2016-10-16 ENCOUNTER — Telehealth: Payer: Self-pay | Admitting: Internal Medicine

## 2016-10-16 NOTE — Telephone Encounter (Signed)
Patient called and said that he had hurt his right wrist over the weekend.  Daughter purchased a brace .  Said that he was having some swelling in hand after sleeping in brace. I advised that he should see his primary care to evaluate the wrist and proper wearing of brace.  He just wants to make sure he doesn't miss his upcoming generator replacement 10-31-16.

## 2016-10-16 NOTE — Telephone Encounter (Signed)
New Message   Pt c/o swelling: STAT is pt has developed SOB within 24 hours  1. How long have you been experiencing swelling? Yesterday  2. Where is the swelling located? Hand to elbow  3.  Are you currently taking a "fluid pill"? Yes  4.  Are you currently SOB? No  5.  Have you traveled recently? No

## 2016-10-17 ENCOUNTER — Encounter: Payer: Self-pay | Admitting: Internal Medicine

## 2016-10-17 ENCOUNTER — Other Ambulatory Visit (INDEPENDENT_AMBULATORY_CARE_PROVIDER_SITE_OTHER): Payer: Medicare HMO

## 2016-10-17 ENCOUNTER — Ambulatory Visit (INDEPENDENT_AMBULATORY_CARE_PROVIDER_SITE_OTHER): Payer: Medicare HMO | Admitting: Internal Medicine

## 2016-10-17 DIAGNOSIS — I482 Chronic atrial fibrillation, unspecified: Secondary | ICD-10-CM

## 2016-10-17 DIAGNOSIS — S60212A Contusion of left wrist, initial encounter: Secondary | ICD-10-CM | POA: Diagnosis not present

## 2016-10-17 LAB — BASIC METABOLIC PANEL
BUN: 25 mg/dL — AB (ref 6–23)
CO2: 33 mEq/L — ABNORMAL HIGH (ref 19–32)
CREATININE: 0.97 mg/dL (ref 0.40–1.50)
Calcium: 9.4 mg/dL (ref 8.4–10.5)
Chloride: 100 mEq/L (ref 96–112)
GFR: 77.07 mL/min (ref >60.00)
GLUCOSE: 87 mg/dL (ref 70–99)
POTASSIUM: 4.7 meq/L (ref 3.5–5.1)
Sodium: 138 mEq/L (ref 135–145)

## 2016-10-17 LAB — CBC WITH DIFFERENTIAL/PLATELET
BASOS PCT: 0.6 % (ref 0.0–3.0)
Basophils Absolute: 0 10*3/uL (ref 0.0–0.1)
EOS PCT: 1.3 % (ref 0.0–5.0)
Eosinophils Absolute: 0.1 10*3/uL (ref 0.0–0.7)
HEMATOCRIT: 40.4 % (ref 39.0–52.0)
HEMOGLOBIN: 13.9 g/dL (ref 13.0–17.0)
LYMPHS PCT: 23.5 % (ref 12.0–46.0)
Lymphs Abs: 1.4 10*3/uL (ref 0.7–4.0)
MCHC: 34.3 g/dL (ref 30.0–36.0)
MCV: 100.5 fl — AB (ref 78.0–100.0)
MONOS PCT: 11 % (ref 3.0–12.0)
Monocytes Absolute: 0.6 10*3/uL (ref 0.1–1.0)
NEUTROS ABS: 3.7 10*3/uL (ref 1.4–7.7)
Neutrophils Relative %: 63.6 % (ref 43.0–77.0)
PLATELETS: 223 10*3/uL (ref 150.0–400.0)
RBC: 4.02 Mil/uL — ABNORMAL LOW (ref 4.22–5.81)
RDW: 13.2 % (ref 11.5–15.5)
WBC: 5.9 10*3/uL (ref 4.0–10.5)

## 2016-10-17 LAB — APTT: aPTT: 33.9 s — ABNORMAL HIGH (ref 23.4–32.7)

## 2016-10-17 LAB — PROTIME-INR
INR: 1.6 ratio — ABNORMAL HIGH (ref 0.8–1.0)
Prothrombin Time: 16.8 s — ABNORMAL HIGH (ref 9.6–13.1)

## 2016-10-17 NOTE — Progress Notes (Signed)
Pre visit review using our clinic review tool, if applicable. No additional management support is needed unless otherwise documented below in the visit note. 

## 2016-10-17 NOTE — Assessment & Plan Note (Addendum)
On Eliquis and Coreg Labs

## 2016-10-17 NOTE — Patient Instructions (Signed)
Hematoma A hematoma is a collection of blood under the skin, in an organ, in a body space, in a joint space, or in other tissue. The blood can clot to form a lump that you can see and feel. The lump is often firm and may sometimes become sore and tender. Most hematomas get better in a few days to weeks. However, some hematomas may be serious and require medical care. Hematomas can range in size from very small to very large. What are the causes? A hematoma can be caused by a blunt or penetrating injury. It can also be caused by spontaneous leakage from a blood vessel under the skin. Spontaneous leakage from a blood vessel is more likely to occur in older people, especially those taking blood thinners. Sometimes, a hematoma can develop after certain medical procedures. What are the signs or symptoms?  A firm lump on the body.  Possible pain and tenderness in the area.  Bruising.Blue, dark blue, purple-red, or yellowish skin may appear at the site of the hematoma if the hematoma is close to the surface of the skin. For hematomas in deeper tissues or body spaces, the signs and symptoms may be subtle. For example, an intra-abdominal hematoma may cause abdominal pain, weakness, fainting, and shortness of breath. An intracranial hematoma may cause a headache or symptoms such as weakness, trouble speaking, or a change in consciousness. How is this diagnosed? A hematoma can usually be diagnosed based on your medical history and a physical exam. Imaging tests may be needed if your health care provider suspects a hematoma in deeper tissues or body spaces, such as the abdomen, head, or chest. These tests may include ultrasonography or a CT scan. How is this treated? Hematomas usually go away on their own over time. Rarely does the blood need to be drained out of the body. Large hematomas or those that may affect vital organs will sometimes need surgical drainage or monitoring. Follow these instructions at  home:  Apply ice to the injured area:  Put ice in a plastic bag.  Place a towel between your skin and the bag.  Leave the ice on for 20 minutes, 2-3 times a day for the first 1 to 2 days.  After the first 2 days, switch to using warm compresses on the hematoma.  Elevate the injured area to help decrease pain and swelling. Wrapping the area with an elastic bandage may also be helpful. Compression helps to reduce swelling and promotes shrinking of the hematoma. Make sure the bandage is not wrapped too tight.  If your hematoma is on a lower extremity and is painful, crutches may be helpful for a couple days.  Only take over-the-counter or prescription medicines as directed by your health care provider. Get help right away if:  You have increasing pain, or your pain is not controlled with medicine.  You have a fever.  You have worsening swelling or discoloration.  Your skin over the hematoma breaks or starts bleeding.  Your hematoma is in your chest or abdomen and you have weakness, shortness of breath, or a change in consciousness.  Your hematoma is on your scalp (caused by a fall or injury) and you have a worsening headache or a change in alertness or consciousness. This information is not intended to replace advice given to you by your health care provider. Make sure you discuss any questions you have with your health care provider. Document Released: 04/17/2004 Document Revised: 02/09/2016 Document Reviewed: 02/11/2013 Elsevier Interactive Patient   Education  2017 [employer]vier [employer]  

## 2016-10-17 NOTE — Progress Notes (Signed)
Subjective:  Patient ID: Ricky [name], male    DOB: [birth year]  Age: 83 y.o. MRN: [id number]  CC: Left wrist pain (c/o having a cramp in wrist Sunday evening felt like tendon was tearing)   HPI Ricky [name] presents for acute severe pain and swelling in the L wrist on Sun when he was watching TV. He noticed a bruise on Monday. C/o pain w/ROM  Outpatient Medications Prior to Visit  Medication Sig Dispense Refill  . apixaban (ELIQUIS) 5 MG TABS tablet Take 1 tablet (5 mg total) by mouth 2 (two) times daily. 180 tablet 3  . Ascorbic Acid (VITAMIN C PO) Take 1 tablet by mouth daily.    . Cholecalciferol 1000 UNITS tablet Take 1,000 Units by mouth daily. Reported on 12/23/2015    . diphenhydrAMINE (BENADRYL) 25 mg capsule Take 1-2 capsules (25-50 mg total) by mouth every 4 (four) hours as needed for itching or allergies (1-2 for bee stings). 60 capsule 2  . EPINEPHrine (EPIPEN 2-PAK) 0.3 mg/0.3 mL DEVI Inject 0.3 mg into the muscle daily as needed (allergic reaction). For allergic reaction    . irbesartan (AVAPRO) 300 MG tablet TAKE ONE TABLET BY MOUTH AT BEDTIME 90 tablet 1  . levothyroxine (SYNTHROID, LEVOTHROID) 75 MCG tablet Take 75 mcg by mouth daily.    Marland Kitchen torsemide (DEMADEX) 10 MG tablet Take 1 tablet (10 mg total) by mouth as directed. Take 2 by mouth every morning and 1 by mouth every evening. 270 tablet 1  . carvedilol (COREG) 25 MG tablet Take 1 tablet (25 mg total) by mouth 2 (two) times daily. 180 tablet 3   No facility-administered medications prior to visit.     ROS Review of Systems  Constitutional: Negative for appetite change, fatigue and unexpected weight change.  HENT: Negative for congestion, nosebleeds, sneezing, sore throat and trouble swallowing.   Eyes: Positive for photophobia. Negative for itching and visual disturbance.  Respiratory: Negative for cough.   Cardiovascular: Negative for chest pain, palpitations and leg swelling.  Gastrointestinal: Negative  for abdominal distention, blood in stool, diarrhea and nausea.  Genitourinary: Negative for frequency and hematuria.  Musculoskeletal: Positive for arthralgias and gait problem. Negative for back pain, joint swelling and neck pain.  Skin: Positive for color change. Negative for rash.  Neurological: Negative for dizziness, tremors, speech difficulty and weakness.  Psychiatric/Behavioral: Negative for agitation, dysphoric mood and sleep disturbance. The patient is not nervous/anxious.     Objective:  BP (!) 144/78 (BP Location: Right Arm, Patient Position: Sitting, Cuff Size: Normal)   Pulse (!) 59   Temp 97.8 F (36.6 C) (Oral)   Resp 20   Ht 5\' 11"  (1.803 m)   Wt 169 lb 8 oz (76.9 kg)   SpO2 97%   BMI 23.64 kg/m   BP Readings from Last 3 Encounters:  10/17/16 (!) 144/78  10/09/16 (!) 150/80  08/01/16 (!) 160/90    Wt Readings from Last 3 Encounters:  10/17/16 169 lb 8 oz (76.9 kg)  10/09/16 166 lb 3.2 oz (75.4 kg)  08/01/16 168 lb (76.2 kg)    Physical Exam  Constitutional: He is oriented to person, place, and time. He appears well-developed. No distress.  NAD  HENT:  Mouth/Throat: Oropharynx is clear and moist.  Eyes: Conjunctivae are normal. Pupils are equal, round, and reactive to light.  Neck: Normal range of motion. No JVD present. No thyromegaly present.  Cardiovascular: Intact distal pulses.  Exam reveals no gallop and no friction  rub.   Murmur heard. Pulmonary/Chest: Effort normal and breath sounds normal. No respiratory distress. He has no wheezes. He has no rales. He exhibits no tenderness.  Abdominal: Soft. Bowel sounds are normal. He exhibits no distension and no mass. There is no tenderness. There is no rebound and no guarding.  Musculoskeletal: Normal range of motion. He exhibits edema and tenderness.  Lymphadenopathy:    He has no cervical adenopathy.  Neurological: He is alert and oriented to person, place, and time. He has normal reflexes. No cranial  nerve deficit. He exhibits normal muscle tone. He displays a negative Romberg sign. Coordination abnormal. Gait normal.  Skin: Skin is warm and dry. No rash noted. No pallor.  Psychiatric: He has a normal mood and affect. His behavior is normal. Judgment and thought content normal.  Cane L medial wrist w/a large hematoma 15x7 cm, tender. ROM is OK. No heat, redness  Lab Results  Component Value Date   WBC 6.4 04/27/2016   HGB 13.4 04/27/2016   HCT 39.7 04/27/2016   PLT 238.0 04/27/2016   GLUCOSE 85 08/01/2016   CHOL 210 (H) 12/28/2013   TRIG 59.0 12/28/2013   HDL 63.40 12/28/2013   LDLCALC 135 (H) 12/28/2013   ALT 14 04/27/2016   AST 20 04/27/2016   NA 138 08/01/2016   K 4.9 08/01/2016   CL 99 08/01/2016   CREATININE 0.94 08/01/2016   BUN 25 (H) 08/01/2016   CO2 35 (H) 08/01/2016   TSH 1.12 04/27/2016   PSA 0.84 12/28/2013   INR 1.33 09/14/2015    US Breast Ltd Uni Left Inc Axilla  Result Date: 11/08/2015 CLINICAL DATA:  83 year old male presenting for evaluation of intermittent left nipple tenderness. EXAM: DIGITAL DIAGNOSTIC BILATERAL MAMMOGRAM WITH 3D TOMOSYNTHESIS AND CAD LEFT BREAST ULTRASOUND COMPARISON:  None. ACR Breast Density Category a: The breast tissue is almost entirely fatty. FINDINGS: In the left greater than right breast, there is flame shaped density central to the nipple, most consistent with gynecomastia. No discrete masses are seen within this dense tissue. However, due to the abundance of this tissue in the left breast and the associated symptoms of left nipple tenderness, ultrasound will be performed for further evaluation. Mammographic images were processed with CAD. Physical exam of the left breast demonstrates firm tissue in the subareolar region, without discrete palpable mass. Exam of the contralateral breast demonstrates similar tissue. Ultrasound of the subareolar left breast demonstrates branching fibroglandular tissue without discrete mass or suspicious  area of shadowing. The contralateral breast was imaged for comparison purposes, demonstrating similar but less abundant tissue. IMPRESSION: 1. Benign gynecomastia is identified in the left greater than right breast. This can be associated with pain. 2.  No mammographic evidence of malignancy in the bilateral breasts. RECOMMENDATION: Clinical follow-up recommended for the left greater than right gynecomastia and associated left nipple sensitivity. I have discussed the findings and recommendations with the patient. Results were also provided in writing at the conclusion of the visit. If applicable, a reminder letter will be sent to the patient regarding the next appointment. BI-RADS CATEGORY  2: Benign. Electronically Signed   By: Ammie Ferrier M.D.   On: 11/08/2015 16:31   Mm Diag Breast Tomo Bilateral  Result Date: 11/08/2015 CLINICAL DATA:  83 year old male presenting for evaluation of intermittent left nipple tenderness. EXAM: DIGITAL DIAGNOSTIC BILATERAL MAMMOGRAM WITH 3D TOMOSYNTHESIS AND CAD LEFT BREAST ULTRASOUND COMPARISON:  None. ACR Breast Density Category a: The breast tissue is almost entirely fatty. FINDINGS: In the  left greater than right breast, there is flame shaped density central to the nipple, most consistent with gynecomastia. No discrete masses are seen within this dense tissue. However, due to the abundance of this tissue in the left breast and the associated symptoms of left nipple tenderness, ultrasound will be performed for further evaluation. Mammographic images were processed with CAD. Physical exam of the left breast demonstrates firm tissue in the subareolar region, without discrete palpable mass. Exam of the contralateral breast demonstrates similar tissue. Ultrasound of the subareolar left breast demonstrates branching fibroglandular tissue without discrete mass or suspicious area of shadowing. The contralateral breast was imaged for comparison purposes, demonstrating similar  but less abundant tissue. IMPRESSION: 1. Benign gynecomastia is identified in the left greater than right breast. This can be associated with pain. 2.  No mammographic evidence of malignancy in the bilateral breasts. RECOMMENDATION: Clinical follow-up recommended for the left greater than right gynecomastia and associated left nipple sensitivity. I have discussed the findings and recommendations with the patient. Results were also provided in writing at the conclusion of the visit. If applicable, a reminder letter will be sent to the patient regarding the next appointment. BI-RADS CATEGORY  2: Benign. Electronically Signed   By: Ammie Ferrier M.D.   On: 11/08/2015 16:31    Assessment & Plan:   There are no diagnoses linked to this encounter. I am having Mr. Curtis maintain his EPINEPHrine, Cholecalciferol, levothyroxine, carvedilol, apixaban, Ascorbic Acid (VITAMIN C PO), diphenhydrAMINE, torsemide, and irbesartan.  No orders of the defined types were placed in this encounter.    Follow-up: No Follow-up on file.  Walker Kehr, MD

## 2016-10-17 NOTE — Assessment & Plan Note (Addendum)
L medial wrist w/a large spontaneous hematoma. ROM is OK. Use heat, elevation, splint  No known injury

## 2016-10-19 NOTE — Telephone Encounter (Signed)
New message   Patient calling with a question for the nurse.    1. Instruction said have lab work done morning of procedure - PCP was done by Dr. Alain Marion few days ago.   2. Discharge instruction when he get home.

## 2016-10-19 NOTE — Telephone Encounter (Signed)
Patient wanted to know if the labs he had drawn at his PCP on 1/31 could be used for his generator change on 2/14. I explained to the patient that labs had to be within a week of the procedure and that he should plan to still have them drawn the morning of th procedure.  The patient is also asking for discharge instructions fro when he goes home after the procedure. I explained to the patient that they would be sure to go over discharge instructions before he goes home. The patient is requesting that Dr. [name] RN give him a call prior to his procedure to go over his discharge instructions. Message routed to RN.

## 2016-10-23 ENCOUNTER — Other Ambulatory Visit: Payer: Self-pay | Admitting: Internal Medicine

## 2016-10-23 NOTE — Telephone Encounter (Signed)
I called and spoke with the patient. I advised him that his lab work from 10/17/16 is within date for his procedure on 10/31/16, that the new guidelines from the hospital require lab work within 14 days of the procedure.   I also answered his other questions regarding post procedure discharge.

## 2016-10-31 ENCOUNTER — Ambulatory Visit (HOSPITAL_COMMUNITY)
Admission: RE | Admit: 2016-10-31 | Discharge: 2016-10-31 | Disposition: A | Discharge status: Home / Self Care | Payer: Medicare HMO | Source: Ambulatory Visit | Attending: Internal Medicine | Admitting: Internal Medicine

## 2016-10-31 ENCOUNTER — Encounter (HOSPITAL_COMMUNITY)
Admission: RE | Disposition: A | Discharge status: Home / Self Care | Payer: Self-pay | Source: Ambulatory Visit | Attending: Internal Medicine

## 2016-10-31 DIAGNOSIS — Z7901 Long term (current) use of anticoagulants: Secondary | ICD-10-CM | POA: Insufficient documentation

## 2016-10-31 DIAGNOSIS — Z950 Presence of cardiac pacemaker: Secondary | ICD-10-CM | POA: Diagnosis present

## 2016-10-31 DIAGNOSIS — Z880 Allergy status to penicillin: Secondary | ICD-10-CM | POA: Insufficient documentation

## 2016-10-31 DIAGNOSIS — N401 Enlarged prostate with lower urinary tract symptoms: Secondary | ICD-10-CM | POA: Insufficient documentation

## 2016-10-31 DIAGNOSIS — I10 Essential (primary) hypertension: Secondary | ICD-10-CM | POA: Insufficient documentation

## 2016-10-31 DIAGNOSIS — R351 Nocturia: Secondary | ICD-10-CM | POA: Insufficient documentation

## 2016-10-31 DIAGNOSIS — I482 Chronic atrial fibrillation: Secondary | ICD-10-CM | POA: Insufficient documentation

## 2016-10-31 DIAGNOSIS — Z45010 Encounter for checking and testing of cardiac pacemaker pulse generator [battery]: Secondary | ICD-10-CM | POA: Diagnosis not present

## 2016-10-31 DIAGNOSIS — F419 Anxiety disorder, unspecified: Secondary | ICD-10-CM | POA: Diagnosis not present

## 2016-10-31 DIAGNOSIS — I442 Atrioventricular block, complete: Secondary | ICD-10-CM | POA: Diagnosis present

## 2016-10-31 DIAGNOSIS — E039 Hypothyroidism, unspecified: Secondary | ICD-10-CM | POA: Diagnosis not present

## 2016-10-31 DIAGNOSIS — R69 Illness, unspecified: Secondary | ICD-10-CM | POA: Diagnosis not present

## 2016-10-31 HISTORY — PX: PPM GENERATOR CHANGEOUT: EP1233

## 2016-10-31 LAB — SURGICAL PCR SCREEN
MRSA, PCR: NEGATIVE
STAPHYLOCOCCUS AUREUS: POSITIVE — AB

## 2016-10-31 SURGERY — PPM GENERATOR CHANGEOUT
Anesthesia: LOCAL

## 2016-10-31 MED ORDER — SODIUM CHLORIDE 0.9 % IV SOLN
INTRAVENOUS | Status: DC
  Administered 2016-10-31: 12:00:00 via INTRAVENOUS

## 2016-10-31 MED ORDER — LABETALOL HCL 5 MG/ML IV SOLN
INTRAVENOUS | Status: DC | PRN
  Administered 2016-10-31: 20 mg via INTRAVENOUS

## 2016-10-31 MED ORDER — ACETAMINOPHEN 325 MG PO TABS
325.0000 mg | ORAL_TABLET | ORAL | Status: DC | PRN
  Filled 2016-10-31: qty 2

## 2016-10-31 MED ORDER — SODIUM CHLORIDE 0.9 % IR SOLN
80.0000 mg | Status: AC
  Administered 2016-10-31: 80 mg

## 2016-10-31 MED ORDER — VANCOMYCIN HCL IN DEXTROSE 1-5 GM/200ML-% IV SOLN
1000.0000 mg | INTRAVENOUS | Status: AC
  Administered 2016-10-31: 1000 mg via INTRAVENOUS

## 2016-10-31 MED ORDER — HYDRALAZINE HCL 20 MG/ML IJ SOLN
INTRAMUSCULAR | Status: AC
  Administered 2016-10-31: 5 mg via INTRAVENOUS
  Filled 2016-10-31: qty 1

## 2016-10-31 MED ORDER — LABETALOL HCL 5 MG/ML IV SOLN
INTRAVENOUS | Status: AC
  Filled 2016-10-31: qty 4

## 2016-10-31 MED ORDER — SODIUM CHLORIDE 0.9 % IR SOLN
Status: AC
  Filled 2016-10-31: qty 2

## 2016-10-31 MED ORDER — HYDRALAZINE HCL 20 MG/ML IJ SOLN
5.0000 mg | Freq: Once | INTRAMUSCULAR | Status: AC
  Administered 2016-10-31: 5 mg via INTRAVENOUS

## 2016-10-31 MED ORDER — ONDANSETRON HCL 4 MG/2ML IJ SOLN: 4.0000 mg | Freq: Four times a day (QID) | INTRAMUSCULAR | Status: DC | PRN

## 2016-10-31 MED ORDER — MUPIROCIN 2 % EX OINT
TOPICAL_OINTMENT | CUTANEOUS | Status: AC
  Administered 2016-10-31: 12:00:00
  Filled 2016-10-31: qty 22

## 2016-10-31 MED ORDER — FUROSEMIDE 10 MG/ML IJ SOLN
INTRAMUSCULAR | Status: AC
  Filled 2016-10-31: qty 4

## 2016-10-31 MED ORDER — LIDOCAINE HCL (PF) 1 % IJ SOLN
INTRAMUSCULAR | Status: AC
  Filled 2016-10-31: qty 60

## 2016-10-31 MED ORDER — SODIUM CHLORIDE 0.9 % IV SOLN: INTRAVENOUS | Status: AC

## 2016-10-31 MED ORDER — LIDOCAINE HCL (PF) 1 % IJ SOLN
INTRAMUSCULAR | Status: DC | PRN
  Administered 2016-10-31: 45 mL

## 2016-10-31 MED ORDER — VANCOMYCIN HCL IN DEXTROSE 1-5 GM/200ML-% IV SOLN
INTRAVENOUS | Status: AC
  Filled 2016-10-31: qty 200

## 2016-10-31 SURGICAL SUPPLY — 7 items
CABLE SURGICAL S-101-97-12 (CABLE) ×1 IMPLANT
DEVICE DISSECT PLASMABLAD 3.0S (MISCELLANEOUS) IMPLANT
HEMOSTAT SURGICEL 2X4 FIBR (HEMOSTASIS) ×1 IMPLANT
PACEMAKER ASSURITY DR-RF (Pacemaker) ×1 IMPLANT
PAD DEFIB LIFELINK (PAD) ×1 IMPLANT
PLASMABLADE 3.0S (MISCELLANEOUS) ×2
TRAY PACEMAKER INSERTION (CUSTOM PROCEDURE TRAY) ×1 IMPLANT

## 2016-10-31 NOTE — Interval H&P Note (Signed)
History and Physical Interval Note:  10/31/2016 1:58 PM  Ricky [name]  has presented today for surgery, with the diagnosis of ERI  The various methods of treatment have been discussed with the patient and family. After consideration of risks, benefits and other options for treatment, the patient has consented to  Procedure(s): PPM Generator Changeout (N/A) as a surgical intervention .  The patient's history has been reviewed, patient examined, no change in status, stable for surgery.  I have reviewed the patient's chart and labs.  Questions were answered to the patient's satisfaction.     Virl Axe

## 2016-10-31 NOTE — Discharge Instructions (Signed)
Pacemaker Battery Change, Care After °Refer to this sheet in the next few weeks. These instructions provide you with information on caring for yourself after your procedure. Your health care provider may also give you more specific instructions. Your treatment has been planned according to current medical practices, but problems sometimes occur. Call your health care provider if you have any problems or questions after your procedure. °WHAT TO EXPECT AFTER THE PROCEDURE °After your procedure, it is typical to have the following sensations: °· Soreness at the pacemaker site. °HOME CARE INSTRUCTIONS  °· Keep the incision clean and dry. °· Unless advised otherwise, you may shower beginning 48 hours after your procedure. °· For the first week after the replacement, avoid stretching motions that pull at the incision site, and avoid heavy exercise with the arm that is on the same side as the incision. °· Take medicines only as directed by your health care provider. °· Keep all follow-up visits as directed by your health care provider. °SEEK MEDICAL CARE IF:  °· You have pain at the incision site that is not relieved by over-the-counter or prescription medicine. °· There is drainage or pus from the incision site. °· There is swelling larger than a lime at the incision site. °· You develop red streaking that extends above or below the incision site. °· You feel brief, intermittent palpitations, light-headedness, or any symptoms that you feel might be related to your heart. °SEEK IMMEDIATE MEDICAL CARE IF:  °· You experience chest pain that is different than the pain at the pacemaker site. °· You experience shortness of breath. °· You have palpitations or irregular heartbeat. °· You have light-headedness that does not go away quickly. °· You faint. °· You have pain that gets worse and is not relieved by medicine. °This information is not intended to replace advice given to you by your health care provider. Make sure you  discuss any questions you have with your health care provider. °Document Released: 06/24/2013 Document Revised: 09/24/2014 Document Reviewed: 06/24/2013 °Elsevier Interactive Patient Education © 2017 [employer] ° °

## 2016-10-31 NOTE — H&P (View-Only) (Signed)
Patient Care Team: Cassandria Anger, MD as PCP - General Deboraha Sprang, MD (Cardiology) Clarene Essex, MD as Consulting Physician (Gastroenterology)   HPI  Ricky [name] is a 83 y.o. male is seen in followup for atrial fibrillation that is permanent. He is status post AV junction ablation following 2 failed Pulmonary Vein ablation procedures. He is status post pacemaker implantation and is device dependent.   He has reached ERI   Now with some loss of functional status  He now walks with a cane for stability  Tolerating apixoban   .   r   Past Medical History:  Diagnosis Date  . ANXIETY 04/26/2008  . Atrial fibrillation ([place]) 10/31/2010   Coumadin therapy;  Echo 6/12: EF 50-55%, moderate MR, moderate TR, PASP 52, mild LAE  . BPH (benign prostatic hypertrophy)   . Essential hypertension, benign 10/03/2007  . HTN (hypertension) 06/07/2013  . HYPOTHYROIDISM 09/04/2007  . LYMPHADENOPATHY 04/06/2009  . Nocturia   . Pacemaker    Status post AV nodal ablation  . TOBACCO USE, QUIT 07/14/2009    Past Surgical History:  Procedure Laterality Date  . COLONOSCOPY    . EYE SURGERY  2000   eye muscle release  . EYE SURGERY     both cataracts  . HERNIA REPAIR    . INGUINAL HERNIA REPAIR Right 05/06/2013   Procedure: HERNIA REPAIR INGUINAL ADULT;  Surgeon: Haywood Lasso, MD;  Location: [facility];  Service: General;  Laterality: Right;  . PACEMAKER INSERTION  10/16/05    Current Outpatient Prescriptions  Medication Sig Dispense Refill  . apixaban (ELIQUIS) 5 MG TABS tablet Take 1 tablet (5 mg total) by mouth 2 (two) times daily. 180 tablet 3  . Ascorbic Acid (VITAMIN C PO) Take 1 tablet by mouth daily.    . Cholecalciferol 1000 UNITS tablet Take 1,000 Units by mouth daily. Reported on 12/23/2015    . diphenhydrAMINE (BENADRYL) 25 mg capsule Take 1-2 capsules (25-50 mg total) by mouth every 4 (four) hours as needed for itching or allergies (1-2 for bee  stings). 60 capsule 2  . EPINEPHrine (EPIPEN 2-PAK) 0.3 mg/0.3 mL DEVI Inject 0.3 mg into the muscle daily as needed (allergic reaction). For allergic reaction    . irbesartan (AVAPRO) 300 MG tablet TAKE ONE TABLET BY MOUTH AT BEDTIME 90 tablet 1  . levothyroxine (SYNTHROID, LEVOTHROID) 75 MCG tablet Take 75 mcg by mouth daily.    Marland Kitchen torsemide (DEMADEX) 10 MG tablet Take 1 tablet (10 mg total) by mouth as directed. Take 2 by mouth every morning and 1 by mouth every evening. 270 tablet 1  . carvedilol (COREG) 25 MG tablet Take 1 tablet (25 mg total) by mouth 2 (two) times daily. 180 tablet 3   No current facility-administered medications for this visit.     Allergies  Allergen Reactions  . Bee Venom Anaphylaxis  . Amiodarone Hcl     Weakness in muscles   . Amoxicillin     Rash  . Atorvastatin     unknown  . Benazepril Hcl     unknown  . Clonidine Hydrochloride     unknown  . Colesevelam     unknown  . Diltiazem Hcl     unknown  . Ezetimibe     Unknown   . Verapamil Other (See Comments)    NO REACTION LISTED    Review of Systems negative except from HPI and PMH  Physical Exam  BP (!) 150/80   Pulse 86   Ht 5\' 11"  (1.803 m)   Wt 166 lb 3.2 oz (75.4 kg)   SpO2 96%   BMI 23.18 kg/m  Well developed and well nourished in no acute distress HENT normal E scleral and icterus clear Neck Supple JVP flat; carotids brisk and full Clear to ausculation Device pocket well healed; without hematoma or erythema.  There is no tethering  With lead on top of device  Regular rate and rhythm, no murmurs gallops or rub Soft with active bowel sounds No clubbing cyanosis  Edema Alert and oriented, grossly normal motor and sensory function Skin Warm and Dry  ECG demonstrates atrial fibrillation with ventricular pacing  Assessment and  Plan  Atrial fibrillation-permanent  Complete heart block s/p AV ablation  Pacemaker-St. Jude The patient's device was interrogated and the information  was fully reviewed.    . Will reprogram to bipolar  Hypertension well controlled   On Anticoagulation;  No bleeding issues   Needs generator replacement  We have reviewed the benefits and risks of generator replacement.  These include but are not limited to lead fracture and infection.  The patient understands, agrees and is willing to proceed.

## 2016-11-01 ENCOUNTER — Encounter (HOSPITAL_COMMUNITY): Payer: Self-pay | Admitting: Internal Medicine

## 2016-11-01 MED FILL — Gentamicin Sulfate Inj 40 MG/ML: INTRAMUSCULAR | Qty: 2 | Status: AC

## 2016-11-01 MED FILL — Furosemide Inj 10 MG/ML: INTRAMUSCULAR | Qty: 4 | Status: AC

## 2016-11-01 MED FILL — Sodium Chloride Irrigation Soln 0.9%: Qty: 500 | Status: AC

## 2016-11-07 ENCOUNTER — Ambulatory Visit (INDEPENDENT_AMBULATORY_CARE_PROVIDER_SITE_OTHER): Payer: Medicare HMO | Admitting: *Deleted

## 2016-11-07 ENCOUNTER — Telehealth: Payer: Self-pay | Admitting: Internal Medicine

## 2016-11-07 DIAGNOSIS — I442 Atrioventricular block, complete: Secondary | ICD-10-CM

## 2016-11-07 NOTE — Progress Notes (Signed)
Patient presented with c/o of brusing at site of gen change. Upon assessment large, soft hematoma was present with diffuse ecchymosis of incision site as well as inferior to incision and right lateral chest/abdomen. WC assessed site and ordered Eliquis to be held till Saturday and a wound re-check on Monday 2/26 while SK is in office. Pt. Instructions given as well as device clinic number in case of any additional concerns. Pt. Verbalized understanding.

## 2016-11-07 NOTE — Patient Instructions (Signed)
Hold your Eliquis from Wednesday 2/21 until Saturday 2/24. Restart Eliquis on Sunday 2/25

## 2016-11-07 NOTE — Telephone Encounter (Signed)
Spoke with patient who reports his screen door hitting his device site a couple days ago. He states that he has recently noticed he feels that it is swelling and wants someone to assess it. Will add him on to be seen today in the [facility].

## 2016-11-07 NOTE — Telephone Encounter (Signed)
New Message   1. Has your device fired? No  2. Is you device beeping? No  3. Are you experiencing draining or swelling at device site? Yes  4. Are you calling to see if we received your device transmission? No  5. Have you passed out? No  Please f/u

## 2016-11-08 ENCOUNTER — Telehealth: Payer: Self-pay | Admitting: *Deleted

## 2016-11-08 ENCOUNTER — Encounter: Payer: Medicare HMO | Admitting: Nurse Practitioner

## 2016-11-08 NOTE — Telephone Encounter (Signed)
Patient called to report that he feels "hot and clammy", like he may have a fever.  He states he feels that his PPM implant site is infected.  Patient denies ShOB, chest discomfort, drainage from site, or other symptoms.  He does not have a thermometer and does not know anyone with a thermometer.  He has been holding Eliquis as instructed.  He can have transportation to the office this afternoon, but no EP in office.  Advised patient that I will discuss with Chanetta Marshall, NP, and call him back.  He is agreeable to this plan.  Spoke with Chanetta Marshall, NP.  Recommendations to see patient in clinic tomorrow.  Patient is agreeable to appointment at 12:00pm tomorrow, 11/09/16 (put on the schedule for 12:20pm, unable to double-book slot).  He agrees to call 911 and proceed to the ED if his symptoms worsen overnight.  Patient is appreciative of call and denies additional questions or concerns at this time.

## 2016-11-09 ENCOUNTER — Encounter: Payer: Medicare HMO | Admitting: Nurse Practitioner

## 2016-11-09 ENCOUNTER — Telehealth: Payer: Self-pay | Admitting: Nurse Practitioner

## 2016-11-09 NOTE — Telephone Encounter (Signed)
New Message    Please call pt would like to speak with you before he comes in for his appt

## 2016-11-09 NOTE — Telephone Encounter (Signed)
Patient stated that he is feeling better since last night. Temp is 97.4.  Stated that he wants to cancel today's appt since he has another one to come in on this Monday. Cancelled appt.

## 2016-11-12 ENCOUNTER — Ambulatory Visit (INDEPENDENT_AMBULATORY_CARE_PROVIDER_SITE_OTHER): Payer: Medicare HMO | Admitting: *Deleted

## 2016-11-12 DIAGNOSIS — I442 Atrioventricular block, complete: Secondary | ICD-10-CM | POA: Diagnosis not present

## 2016-11-12 NOTE — Progress Notes (Signed)
Wound check appointment. Soft hematoma noted, with healing stages of bruising, per SK dermabond to be removed 11/19/2016. Normal device function. Thresholds, sensing, and impedances consistent with implant measurements. Device programmed at chronic output. Histogram distribution appropriate for patient and level of activity. No ventricular arrhythmias noted. Patient educated about wound care and home monitoring. ROV 11/19/16 with device clinic for dermabond removal.

## 2016-11-14 ENCOUNTER — Ambulatory Visit: Payer: Medicare HMO

## 2016-11-19 ENCOUNTER — Ambulatory Visit (INDEPENDENT_AMBULATORY_CARE_PROVIDER_SITE_OTHER): Payer: Medicare HMO | Admitting: *Deleted

## 2016-11-19 DIAGNOSIS — I442 Atrioventricular block, complete: Secondary | ICD-10-CM

## 2016-11-20 NOTE — Progress Notes (Signed)
Wound check only, dermabond removed. Stitch clipped from left outer edge of site, otherwise incision edges well-approximated. Soft hematoma noted with healing stages of bruising, hematoma smaller in size than at last OV. Pt to call if hematoma does not continue to decrease in size.

## 2016-11-21 ENCOUNTER — Ambulatory Visit: Payer: PPO | Admitting: Internal Medicine

## 2016-11-22 ENCOUNTER — Other Ambulatory Visit: Payer: Self-pay | Admitting: Internal Medicine

## 2016-12-07 ENCOUNTER — Ambulatory Visit (INDEPENDENT_AMBULATORY_CARE_PROVIDER_SITE_OTHER): Payer: Medicare HMO | Admitting: *Deleted

## 2016-12-07 DIAGNOSIS — Z950 Presence of cardiac pacemaker: Secondary | ICD-10-CM

## 2016-12-07 NOTE — Progress Notes (Signed)
Mr. Blount presents to the device clinic for a stitch protruding from PPM site. Ecchymosis noted at site, appears to be in stages of improvement. No edema or drainage noted. Stitch noted at patient's right lateral incision edge on patient's right chest. Stitch clipped, no drainage able to be expressed from site. Bacitracin ointment and bandaid applied. Mr. Niehoff was instructed to remove bandaid tomorrow and continue to wash incision daily with soap and water and leave OTA. He was instructed to call the [facility] if he notes any redness, swelling or drainage at [facility] site. He verbalizes understanding.

## 2016-12-10 ENCOUNTER — Ambulatory Visit: Payer: Medicare HMO

## 2016-12-21 DIAGNOSIS — M6281 Muscle weakness (generalized): Secondary | ICD-10-CM | POA: Diagnosis not present

## 2016-12-21 DIAGNOSIS — Z7901 Long term (current) use of anticoagulants: Secondary | ICD-10-CM | POA: Diagnosis not present

## 2016-12-21 DIAGNOSIS — Z6824 Body mass index (BMI) 24.0-24.9, adult: Secondary | ICD-10-CM | POA: Diagnosis not present

## 2016-12-21 DIAGNOSIS — I509 Heart failure, unspecified: Secondary | ICD-10-CM | POA: Diagnosis not present

## 2016-12-21 DIAGNOSIS — Z950 Presence of cardiac pacemaker: Secondary | ICD-10-CM | POA: Diagnosis not present

## 2016-12-21 DIAGNOSIS — Z0000 Encounter for general adult medical examination without abnormal findings: Secondary | ICD-10-CM | POA: Diagnosis not present

## 2016-12-21 DIAGNOSIS — I110 Hypertensive heart disease with heart failure: Secondary | ICD-10-CM | POA: Diagnosis not present

## 2016-12-21 DIAGNOSIS — Z87891 Personal history of nicotine dependence: Secondary | ICD-10-CM | POA: Diagnosis not present

## 2016-12-21 DIAGNOSIS — I4891 Unspecified atrial fibrillation: Secondary | ICD-10-CM | POA: Diagnosis not present

## 2016-12-21 DIAGNOSIS — E039 Hypothyroidism, unspecified: Secondary | ICD-10-CM | POA: Diagnosis not present

## 2016-12-28 ENCOUNTER — Ambulatory Visit (INDEPENDENT_AMBULATORY_CARE_PROVIDER_SITE_OTHER): Payer: Medicare HMO | Admitting: Internal Medicine

## 2016-12-28 ENCOUNTER — Other Ambulatory Visit (INDEPENDENT_AMBULATORY_CARE_PROVIDER_SITE_OTHER): Payer: Medicare HMO

## 2016-12-28 ENCOUNTER — Encounter: Payer: Self-pay | Admitting: Internal Medicine

## 2016-12-28 VITALS — BP 138/82 | HR 65 | Temp 98.0°F | Resp 16 | Wt 166.0 lb

## 2016-12-28 DIAGNOSIS — Z23 Encounter for immunization: Secondary | ICD-10-CM

## 2016-12-28 DIAGNOSIS — I482 Chronic atrial fibrillation, unspecified: Secondary | ICD-10-CM

## 2016-12-28 DIAGNOSIS — I5020 Unspecified systolic (congestive) heart failure: Secondary | ICD-10-CM

## 2016-12-28 LAB — CBC WITH DIFFERENTIAL/PLATELET
BASOS ABS: 0 10*3/uL (ref 0.0–0.1)
BASOS PCT: 0.6 % (ref 0.0–3.0)
EOS ABS: 0.1 10*3/uL (ref 0.0–0.7)
Eosinophils Relative: 1.1 % (ref 0.0–5.0)
HCT: 41.1 % (ref 39.0–52.0)
HEMOGLOBIN: 13.7 g/dL (ref 13.0–17.0)
Lymphocytes Relative: 22.5 % (ref 12.0–46.0)
Lymphs Abs: 1.5 10*3/uL (ref 0.7–4.0)
MCHC: 33.4 g/dL (ref 30.0–36.0)
MCV: 102.4 fl — ABNORMAL HIGH (ref 78.0–100.0)
MONO ABS: 0.5 10*3/uL (ref 0.1–1.0)
Monocytes Relative: 7.2 % (ref 3.0–12.0)
NEUTROS PCT: 68.6 % (ref 43.0–77.0)
Neutro Abs: 4.7 10*3/uL (ref 1.4–7.7)
PLATELETS: 220 10*3/uL (ref 150.0–400.0)
RBC: 4.01 Mil/uL — ABNORMAL LOW (ref 4.22–5.81)
RDW: 13.5 % (ref 11.5–15.5)
WBC: 6.9 10*3/uL (ref 4.0–10.5)

## 2016-12-28 LAB — TSH: TSH: 1.1 u[IU]/mL (ref 0.35–4.50)

## 2016-12-28 LAB — HEPATIC FUNCTION PANEL
ALT: 15 U/L (ref 0–53)
AST: 22 U/L (ref 0–37)
Albumin: 4 g/dL (ref 3.5–5.2)
Alkaline Phosphatase: 75 U/L (ref 39–117)
BILIRUBIN DIRECT: 0.2 mg/dL (ref 0.0–0.3)
BILIRUBIN TOTAL: 0.6 mg/dL (ref 0.2–1.2)
Total Protein: 6.7 g/dL (ref 6.0–8.3)

## 2016-12-28 LAB — BASIC METABOLIC PANEL
BUN: 24 mg/dL — AB (ref 6–23)
CALCIUM: 9.2 mg/dL (ref 8.4–10.5)
CHLORIDE: 101 meq/L (ref 96–112)
CO2: 34 mEq/L — ABNORMAL HIGH (ref 19–32)
CREATININE: 1.11 mg/dL (ref 0.40–1.50)
GFR: 65.93 mL/min (ref >60.00)
Glucose, Bld: 111 mg/dL — ABNORMAL HIGH (ref 70–99)
Potassium: 4.6 mEq/L (ref 3.5–5.1)
Sodium: 140 mEq/L (ref 135–145)

## 2016-12-28 NOTE — Assessment & Plan Note (Signed)
Coreg, Demadex, Losartan, Eliquis 

## 2016-12-28 NOTE — Patient Instructions (Signed)
MC well w/Jill 

## 2016-12-28 NOTE — Assessment & Plan Note (Signed)
Labs

## 2016-12-28 NOTE — Progress Notes (Signed)
Subjective:  Patient ID: Ricky [name], male    DOB: 10-Apr-1923  Age: 83 y.o. MRN: [id number]  CC: No chief complaint on file.   HPI YEHUDA PRINTUP presents for A fib, HTN, CHF f/u  Outpatient Medications Prior to Visit  Medication Sig Dispense Refill  . apixaban (ELIQUIS) 5 MG TABS tablet Take 1 tablet (5 mg total) by mouth 2 (two) times daily. 180 tablet 3  . Ascorbic Acid (VITAMIN C PO) Take 1 tablet by mouth daily.    . Carboxymethylcellul-Glycerin (LUBRICATING EYE DROPS OP) Apply 1 drop to eye 5 (five) times daily.    . Cholecalciferol 1000 UNITS tablet Take 1,000 Units by mouth daily. Reported on 12/23/2015    . diphenhydrAMINE (BENADRYL) 25 mg capsule Take 1-2 capsules (25-50 mg total) by mouth every 4 (four) hours as needed for itching or allergies (1-2 for bee stings). 60 capsule 2  . EPINEPHrine (EPIPEN 2-PAK) 0.3 mg/0.3 mL DEVI Inject 0.3 mg into the muscle daily as needed (allergic reaction). For allergic reaction    . irbesartan (AVAPRO) 300 MG tablet TAKE ONE TABLET BY MOUTH AT BEDTIME (Patient taking differently: Take 150 mg by mouth at bedtime. ) 90 tablet 1  . levothyroxine (SYNTHROID, LEVOTHROID) 75 MCG tablet Take 75 mcg by mouth daily.    . Multiple Vitamin (MULTIVITAMIN WITH MINERALS) TABS tablet Take 1 tablet by mouth 3 (three) times a week.    . torsemide (DEMADEX) 10 MG tablet Take 1 tablet (10 mg total) by mouth as directed. Take 2 by mouth every morning and 1 by mouth every evening. (Patient taking differently: Take 2 by mouth every morning and 1 by mouth every evening.) 270 tablet 1  . torsemide (DEMADEX) 10 MG tablet TAKE TWO TABLETS BY MOUTH IN THE MORNING, THEN TAKE ONE TABLET IN THE EVENING DAILY 270 tablet 1  . carvedilol (COREG) 25 MG tablet Take 1 tablet (25 mg total) by mouth 2 (two) times daily. 180 tablet 3   No facility-administered medications prior to visit.     ROS Review of Systems  Constitutional: Negative for appetite change, fatigue and  unexpected weight change.  HENT: Negative for congestion, nosebleeds, sneezing, sore throat and trouble swallowing.   Eyes: Negative for itching and visual disturbance.  Respiratory: Negative for cough.   Cardiovascular: Negative for chest pain, palpitations and leg swelling.  Gastrointestinal: Negative for abdominal distention, blood in stool, diarrhea and nausea.  Genitourinary: Negative for frequency and hematuria.  Musculoskeletal: Positive for gait problem. Negative for back pain, joint swelling and neck pain.  Skin: Negative for rash.  Neurological: Negative for dizziness, tremors, speech difficulty and weakness.  Psychiatric/Behavioral: Negative for agitation, dysphoric mood and sleep disturbance. The patient is not nervous/anxious.     Objective:  BP 138/82   Pulse 65   Temp 98 F (36.7 C) (Oral)   Resp 16   Wt 166 lb (75.3 kg)   SpO2 98%   BMI 23.15 kg/m   BP Readings from Last 3 Encounters:  12/28/16 138/82  10/31/16 (!) 151/61  10/17/16 (!) 144/78    Wt Readings from Last 3 Encounters:  12/28/16 166 lb (75.3 kg)  10/31/16 169 lb (76.7 kg)  10/17/16 169 lb 8 oz (76.9 kg)    Physical Exam  Constitutional: He is oriented to person, place, and time. He appears well-developed. No distress.  NAD  HENT:  Mouth/Throat: Oropharynx is clear and moist.  Eyes: Conjunctivae are normal. Pupils are equal, round, and reactive  to light.  Neck: Normal range of motion. No JVD present. No thyromegaly present.  Cardiovascular: Normal rate, regular rhythm, normal heart sounds and intact distal pulses.  Exam reveals no gallop and no friction rub.   No murmur heard. Pulmonary/Chest: Effort normal and breath sounds normal. No respiratory distress. He has no wheezes. He has no rales. He exhibits no tenderness.  Abdominal: Soft. Bowel sounds are normal. He exhibits no distension and no mass. There is no tenderness. There is no rebound and no guarding.  Musculoskeletal: Normal range of  motion. He exhibits no edema or tenderness.  Lymphadenopathy:    He has no cervical adenopathy.  Neurological: He is alert and oriented to person, place, and time. He has normal reflexes. No cranial nerve deficit. He exhibits normal muscle tone. He displays a negative Romberg sign. Coordination abnormal. Gait normal.  Skin: Skin is warm and dry. No rash noted.  Psychiatric: He has a normal mood and affect. His behavior is normal. Judgment and thought content normal.  Cane  Lab Results  Component Value Date   WBC 5.9 10/17/2016   HGB 13.9 10/17/2016   HCT 40.4 10/17/2016   PLT 223.0 10/17/2016   GLUCOSE 87 10/17/2016   CHOL 210 (H) 12/28/2013   TRIG 59.0 12/28/2013   HDL 63.40 12/28/2013   LDLCALC 135 (H) 12/28/2013   ALT 14 04/27/2016   AST 20 04/27/2016   NA 138 10/17/2016   K 4.7 10/17/2016   CL 100 10/17/2016   CREATININE 0.97 10/17/2016   BUN 25 (H) 10/17/2016   CO2 33 (H) 10/17/2016   TSH 1.12 04/27/2016   PSA 0.84 12/28/2013   INR 1.6 (H) 10/17/2016    No results found.  Assessment & Plan:   There are no diagnoses linked to this encounter. I am having Mr. Dorko maintain his EPINEPHrine, Cholecalciferol, levothyroxine, carvedilol, apixaban, Ascorbic Acid (VITAMIN C PO), diphenhydrAMINE, torsemide, irbesartan, multivitamin with minerals, Carboxymethylcellul-Glycerin (LUBRICATING EYE DROPS OP), and torsemide.  No orders of the defined types were placed in this encounter.    Follow-up: No Follow-up on file.  Walker Kehr, MD

## 2016-12-28 NOTE — Progress Notes (Signed)
Pre visit review using our clinic review tool, if applicable. No additional management support is needed unless otherwise documented below in the visit note. 

## 2016-12-28 NOTE — Assessment & Plan Note (Signed)
Eliquis, Coreg

## 2017-01-08 ENCOUNTER — Encounter: Payer: Self-pay | Admitting: Internal Medicine

## 2017-01-11 IMAGING — DX DG CHEST 2V
2 series · 2 of 2 positions shown · non-contrast
Comparison: Chest radiograph performed 10/22/2012

CLINICAL DATA: Acute onset of shortness of breath and chest
tightness. Initial encounter.

EXAM:
CHEST  2 VIEW

[chest lat]
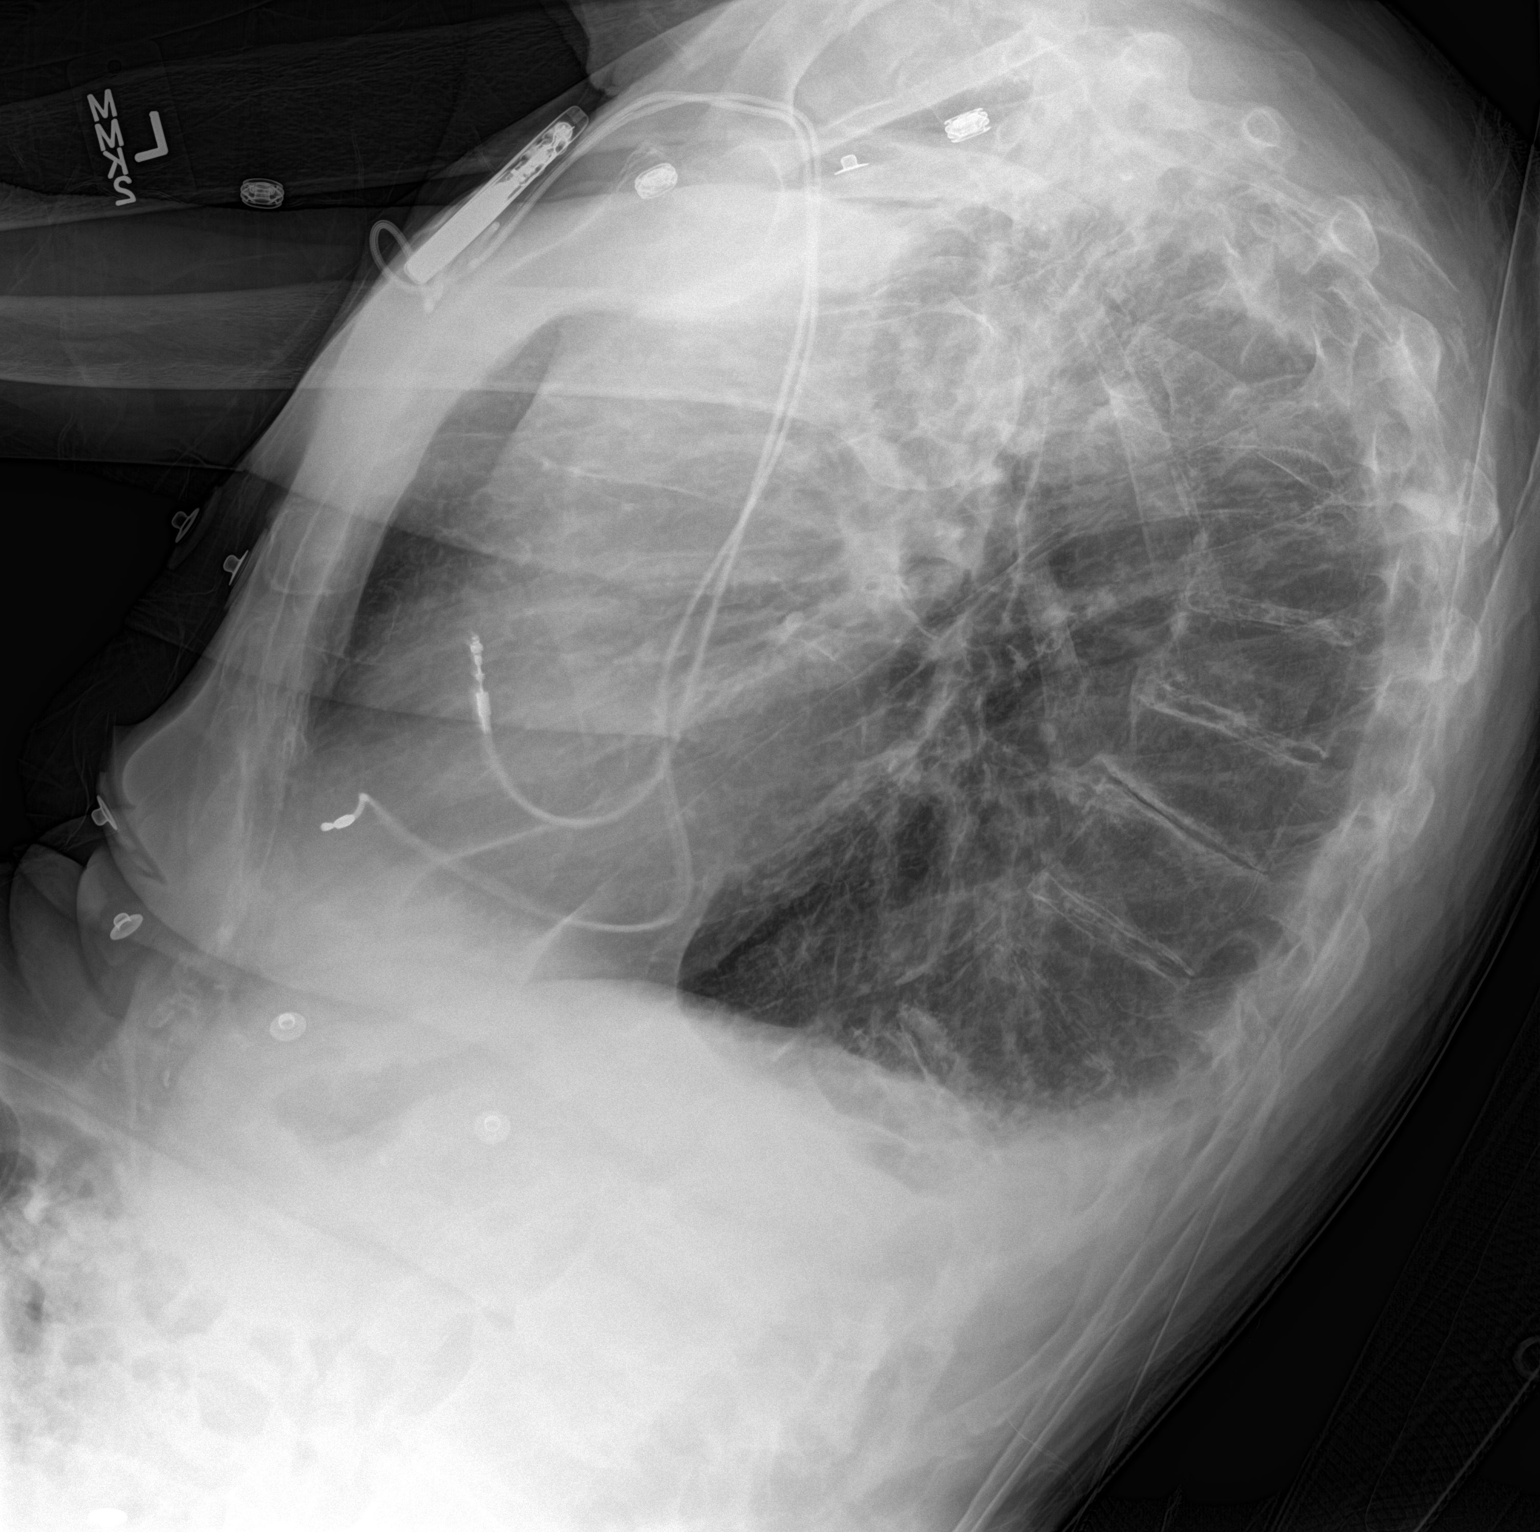

[chest ap]
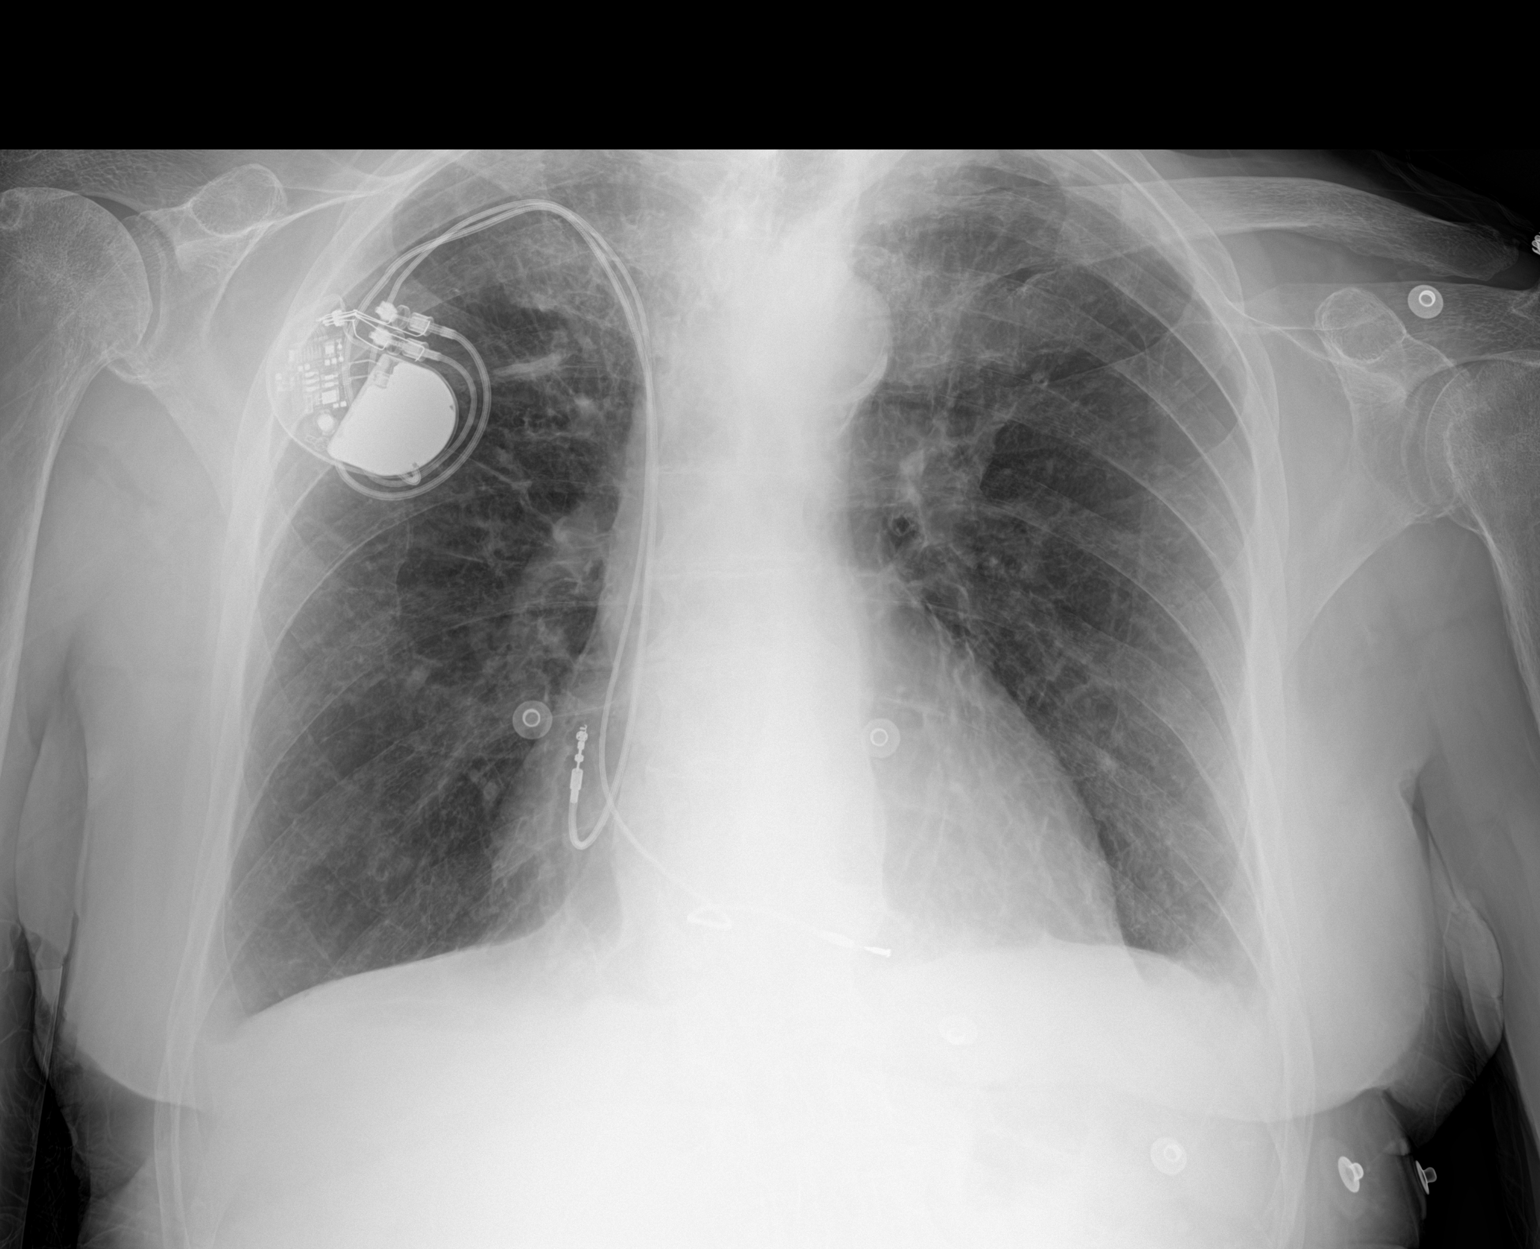

[2 of 2 positions shown; findings below may reference images not displayed]

FINDINGS: The lungs are well-aerated. Small bilateral pleural effusions are
seen. Mild vascular congestion is noted. There is no evidence of
pneumothorax.

The heart is borderline normal in size. A pacemaker is noted at the
right chest wall, with leads ending at the right atrium and right
ventricle. No acute osseous abnormalities are seen.
IMPRESSION: Small bilateral pleural effusions noted. Mild vascular congestion
noted. Lungs otherwise grossly clear.

## 2017-01-29 ENCOUNTER — Encounter: Payer: Self-pay | Admitting: Internal Medicine

## 2017-01-29 ENCOUNTER — Ambulatory Visit (INDEPENDENT_AMBULATORY_CARE_PROVIDER_SITE_OTHER): Payer: Medicare HMO | Admitting: Internal Medicine

## 2017-01-29 VITALS — BP 150/74 | HR 64 | Ht 70.0 in | Wt 167.0 lb

## 2017-01-29 DIAGNOSIS — Z950 Presence of cardiac pacemaker: Secondary | ICD-10-CM | POA: Diagnosis not present

## 2017-01-29 DIAGNOSIS — I482 Chronic atrial fibrillation, unspecified: Secondary | ICD-10-CM

## 2017-01-29 DIAGNOSIS — I442 Atrioventricular block, complete: Secondary | ICD-10-CM

## 2017-01-29 LAB — CUP PACEART INCLINIC DEVICE CHECK
Battery Voltage: 2.99 V
Date Time Interrogation Session: 20180515144929
Implantable Pulse Generator Implant Date: 20180214
Lead Channel Pacing Threshold Amplitude: 0.5 V
Lead Channel Sensing Intrinsic Amplitude: 12 mV
Lead Channel Setting Pacing Pulse Width: 0.8 ms
MDC IDC LEAD IMPLANT DT: 19980824
MDC IDC LEAD IMPLANT DT: 19980824
MDC IDC LEAD LOCATION: 753859
MDC IDC LEAD LOCATION: 753860
MDC IDC MSMT LEADCHNL RV IMPEDANCE VALUE: 375 Ohm
MDC IDC MSMT LEADCHNL RV PACING THRESHOLD PULSEWIDTH: 0.8 ms
MDC IDC PG SERIAL: 7998385
MDC IDC SET LEADCHNL RV PACING AMPLITUDE: 2.5 V
MDC IDC SET LEADCHNL RV SENSING SENSITIVITY: 6 mV
MDC IDC STAT BRADY RA PERCENT PACED: 0 %
MDC IDC STAT BRADY RV PERCENT PACED: 99.77 %
Pulse Gen Model: 2272

## 2017-01-29 NOTE — Patient Instructions (Signed)
Medication Instructions: - .Your physician recommends that you continue on your current medications as directed. Please refer to the Current Medication list given to you today.  Labwork: - none ordered  Procedures/Testing: - none ordered  Follow-Up: - Remote monitoring is used to monitor your Pacemaker of ICD from home. This monitoring reduces the number of office visits required to check your device to one time per year. It allows [place] to keep an eye on the functioning of your device to ensure it is working properly. You are scheduled for a device check from home on 04/30/17. You may send your transmission at any time that day. If you have a wireless device, the transmission will be sent automatically. After your physician reviews your transmission, you will receive a postcard with your next transmission date.  - Your physician wants you to follow-up in: 9 months with Tommye Standard, PA & 2 years with Dr. [name]. You will receive a reminder letter in the mail two months in advance. If you don't receive a letter, please call our office to schedule the follow-up appointment.   Any Additional Special Instructions Will Be Listed Below (If Applicable).     If you need a refill on your cardiac medications before your next appointment, please call your pharmacy.

## 2017-01-29 NOTE — Progress Notes (Signed)
Patient Care Team: Cassandria Anger, MD as PCP - General Deboraha Sprang, MD (Cardiology) Clarene Essex, MD as Consulting Physician (Gastroenterology)   HPI  Ricky [name] is a 83 y.o. male is seen in followup for atrial fibrillation that is permanent. He is status post AV junction ablation following 2 failed Pulmonary Vein ablation procedures. He is status post pacemaker implantation and is device dependent.   His device reached ERI January 2018 and he underwent replacement 2/18  No problems post op  No interval change with symptoms  Continues to complain of exercise intolerance and fatigue  He now walks with a cane for stability     Tolerating apixoban without bleeding      Past Medical History:  Diagnosis Date  . ANXIETY 04/26/2008  . Atrial fibrillation ([place]) 10/31/2010   Coumadin therapy;  Echo 6/12: EF 50-55%, moderate MR, moderate TR, PASP 52, mild LAE  . BPH (benign prostatic hypertrophy)   . Essential hypertension, benign 10/03/2007  . HTN (hypertension) 06/07/2013  . HYPOTHYROIDISM 09/04/2007  . LYMPHADENOPATHY 04/06/2009  . Nocturia   . Pacemaker    Status post AV nodal ablation  . TOBACCO USE, QUIT 07/14/2009    Past Surgical History:  Procedure Laterality Date  . COLONOSCOPY    . EYE SURGERY  2000   eye muscle release  . EYE SURGERY     both cataracts  . HERNIA REPAIR    . INGUINAL HERNIA REPAIR Right 05/06/2013   Procedure: HERNIA REPAIR INGUINAL ADULT;  Surgeon: Haywood Lasso, MD;  Location: [place];  Service: General;  Laterality: Right;  . PACEMAKER INSERTION  10/16/05  . PPM GENERATOR CHANGEOUT N/A 10/31/2016   Procedure: PPM Generator Changeout;  Surgeon: Deboraha Sprang, MD;  Location: [facility];  Service: Cardiovascular;  Laterality: N/A;    Current Outpatient Prescriptions  Medication Sig Dispense Refill  . apixaban (ELIQUIS) 5 MG TABS tablet Take 1 tablet (5 mg total) by mouth 2 (two) times daily. 180  tablet 3  . Ascorbic Acid (VITAMIN C PO) Take 1 tablet by mouth daily.    . Carboxymethylcellul-Glycerin (LUBRICATING EYE DROPS OP) Apply 1 drop to eye 5 (five) times daily.    . carvedilol (COREG) 25 MG tablet Take 1 tablet (25 mg total) by mouth 2 (two) times daily. 180 tablet 3  . Cholecalciferol 1000 UNITS tablet Take 1,000 Units by mouth daily. Reported on 12/23/2015    . diphenhydrAMINE (BENADRYL) 25 mg capsule Take 1-2 capsules (25-50 mg total) by mouth every 4 (four) hours as needed for itching or allergies (1-2 for bee stings). 60 capsule 2  . EPINEPHrine (EPIPEN 2-PAK) 0.3 mg/0.3 mL DEVI Inject 0.3 mg into the muscle daily as needed (allergic reaction). For allergic reaction    . irbesartan (AVAPRO) 150 MG tablet Take 150 mg by mouth at bedtime.    Marland Kitchen levothyroxine (SYNTHROID, LEVOTHROID) 75 MCG tablet Take 75 mcg by mouth daily.    . Multiple Vitamin (MULTIVITAMIN WITH MINERALS) TABS tablet Take 1 tablet by mouth 3 (three) times a week.    . torsemide (DEMADEX) 10 MG tablet Take 2 tablets (20 mg) by mouth in the AM and 1 tablet (10 mg) by mouth in the PM     No current facility-administered medications for this visit.     Allergies  Allergen Reactions  . Bee Venom Anaphylaxis  . Amiodarone Hcl     Weakness in muscles   .  Atorvastatin     unknown  . Benazepril Hcl     unknown  . Clonidine Hydrochloride     unknown  . Colesevelam     unknown  . Diltiazem Hcl     unknown  . Ezetimibe     Unknown   . Verapamil Other (See Comments)    unknown  . Amoxicillin Rash    Has patient had a PCN reaction causing immediate rash, facial/tongue/throat swelling, SOB or lightheadedness with hypotension: Yes Has patient had a PCN reaction causing severe rash involving mucus membranes or skin necrosis: No Has patient had a PCN reaction that required hospitalization Unknow Has patient had a PCN reaction occurring within the last 10 years: Unknown If all of the above answers are "NO", then  may proceed with Cephalosporin use.     Review of Systems negative except from HPI and PMH  Physical Exam BP (!) 150/74   Pulse 64   Ht 5\' 10"  (1.778 m)   Wt 167 lb (75.8 kg)   SpO2 98%   BMI 23.96 kg/m  Well developed and well nourished in no acute distress HENT normal E scleral and icterus clear Neck Supple JVP flat; carotids brisk and full Clear to ausculation Device pocket well healed; without hematoma or erythema.  There is no tethering     Regular rate and rhythm, no murmurs gallops or rub Soft with active bowel sounds No clubbing cyanosis  Edema Alert and oriented, grossly normal motor and sensory function Skin Warm and Dry  ECG demonstrates atrial fibrillation with unipolar ventricular pacing  Assessment and  Plan  Atrial fibrillation-permanent  Complete heart block s/p AV ablation  Pacemaker-St. Jude The patient's device was interrogated and the information was fully reviewed.    . Will reprogram to bipolar  Hypertension well controlled   On Anticoagulation;  No bleeding issues   BP elevated  But will have him followup at home

## 2017-02-22 ENCOUNTER — Other Ambulatory Visit: Payer: Self-pay | Admitting: Internal Medicine

## 2017-03-18 ENCOUNTER — Telehealth: Payer: Self-pay | Admitting: Internal Medicine

## 2017-03-18 DIAGNOSIS — D485 Neoplasm of uncertain behavior of skin: Secondary | ICD-10-CM

## 2017-03-18 NOTE — Telephone Encounter (Signed)
Pt is requesting a referral be sent for him to dermatology. He has multiple places all over his body that he is wanting a dermatologist to look at.

## 2017-03-18 NOTE — Telephone Encounter (Signed)
Please advise 

## 2017-03-18 NOTE — Telephone Encounter (Signed)
Ok Thx 

## 2017-04-02 DIAGNOSIS — R69 Illness, unspecified: Secondary | ICD-10-CM | POA: Diagnosis not present

## 2017-04-29 ENCOUNTER — Ambulatory Visit (INDEPENDENT_AMBULATORY_CARE_PROVIDER_SITE_OTHER): Payer: Medicare HMO | Admitting: Internal Medicine

## 2017-04-29 ENCOUNTER — Encounter: Payer: Self-pay | Admitting: Internal Medicine

## 2017-04-29 DIAGNOSIS — Z7901 Long term (current) use of anticoagulants: Secondary | ICD-10-CM | POA: Diagnosis not present

## 2017-04-29 DIAGNOSIS — I10 Essential (primary) hypertension: Secondary | ICD-10-CM

## 2017-04-29 DIAGNOSIS — I5022 Chronic systolic (congestive) heart failure: Secondary | ICD-10-CM

## 2017-04-29 DIAGNOSIS — Z950 Presence of cardiac pacemaker: Secondary | ICD-10-CM | POA: Diagnosis not present

## 2017-04-29 DIAGNOSIS — I480 Paroxysmal atrial fibrillation: Secondary | ICD-10-CM

## 2017-04-29 MED ORDER — TORSEMIDE 10 MG PO TABS: ORAL_TABLET | ORAL | 3 refills | Status: DC

## 2017-04-29 NOTE — Assessment & Plan Note (Signed)
On Eliquis

## 2017-04-29 NOTE — Progress Notes (Signed)
Subjective:  Patient ID: Ricky [name], male    DOB: Nov 30, 1922  Age: 83 y.o. MRN: [id number]  CC: No chief complaint on file.   HPI Ricky [name] presents for A fib, HTN, pacemaker, edema f/u  Outpatient Medications Prior to Visit  Medication Sig Dispense Refill  . apixaban (ELIQUIS) 5 MG TABS tablet Take 1 tablet (5 mg total) by mouth 2 (two) times daily. 180 tablet 3  . Ascorbic Acid (VITAMIN C PO) Take 1 tablet by mouth daily.    . Carboxymethylcellul-Glycerin (LUBRICATING EYE DROPS OP) Apply 1 drop to eye 5 (five) times daily.    . Cholecalciferol 1000 UNITS tablet Take 1,000 Units by mouth daily. Reported on 12/23/2015    . diphenhydrAMINE (BENADRYL) 25 mg capsule Take 1-2 capsules (25-50 mg total) by mouth every 4 (four) hours as needed for itching or allergies (1-2 for bee stings). 60 capsule 2  . EPINEPHrine (EPIPEN 2-PAK) 0.3 mg/0.3 mL DEVI Inject 0.3 mg into the muscle daily as needed (allergic reaction). For allergic reaction    . irbesartan (AVAPRO) 150 MG tablet Take 150 mg by mouth at bedtime.    . irbesartan (AVAPRO) 300 MG tablet TAKE ONE TABLET BY MOUTH AT BEDTIME 90 tablet 1  . levothyroxine (SYNTHROID, LEVOTHROID) 75 MCG tablet Take 75 mcg by mouth daily.    . Multiple Vitamin (MULTIVITAMIN WITH MINERALS) TABS tablet Take 1 tablet by mouth 3 (three) times a week.    . torsemide (DEMADEX) 10 MG tablet Take 3 tablets (30 mg) by mouth in the AM and 1 tablet (10 mg) by mouth in the PM    . carvedilol (COREG) 25 MG tablet Take 1 tablet (25 mg total) by mouth 2 (two) times daily. 180 tablet 3   No facility-administered medications prior to visit.     ROS Review of Systems  Constitutional: Positive for fatigue. Negative for appetite change and unexpected weight change.  HENT: Negative for congestion, nosebleeds, sneezing, sore throat and trouble swallowing.   Eyes: Negative for itching and visual disturbance.  Respiratory: Negative for cough.   Cardiovascular:  Negative for chest pain, palpitations and leg swelling.  Gastrointestinal: Negative for abdominal distention, blood in stool, diarrhea and nausea.  Genitourinary: Negative for frequency and hematuria.  Musculoskeletal: Positive for gait problem. Negative for back pain, joint swelling and neck pain.  Skin: Negative for rash.  Neurological: Negative for dizziness, tremors, speech difficulty and weakness.  Psychiatric/Behavioral: Negative for agitation, dysphoric mood and sleep disturbance. The patient is not nervous/anxious.     Objective:  BP (!) 142/80 (BP Location: Left Arm, Patient Position: Sitting, Cuff Size: Normal)   Pulse 65   Temp 97.8 F (36.6 C) (Oral)   Ht 5\' 10"  (1.778 m)   Wt 163 lb (73.9 kg)   SpO2 99%   BMI 23.39 kg/m   BP Readings from Last 3 Encounters:  04/29/17 (!) 142/80  01/29/17 (!) 150/74  12/28/16 138/82    Wt Readings from Last 3 Encounters:  04/29/17 163 lb (73.9 kg)  01/29/17 167 lb (75.8 kg)  12/28/16 166 lb (75.3 kg)    Physical Exam  Constitutional: He is oriented to person, place, and time. He appears well-developed. No distress.  NAD  HENT:  Mouth/Throat: Oropharynx is clear and moist.  Eyes: Pupils are equal, round, and reactive to light. Conjunctivae are normal.  Neck: Normal range of motion. No JVD present. No thyromegaly present.  Cardiovascular: Normal rate, regular rhythm, normal heart sounds and  intact distal pulses.  Exam reveals no gallop and no friction rub.   No murmur heard. Pulmonary/Chest: Effort normal and breath sounds normal. No respiratory distress. He has no wheezes. He has no rales. He exhibits no tenderness.  Abdominal: Soft. Bowel sounds are normal. He exhibits no distension and no mass. There is no tenderness. There is no rebound and no guarding.  Musculoskeletal: Normal range of motion. He exhibits no edema or tenderness.  Lymphadenopathy:    He has no cervical adenopathy.  Neurological: He is alert and oriented to  person, place, and time. He has normal reflexes. No cranial nerve deficit. He exhibits normal muscle tone. He displays a negative Romberg sign. Coordination abnormal. Gait normal.  Skin: Skin is warm and dry. No rash noted.  Psychiatric: He has a normal mood and affect. His behavior is normal. Judgment and thought content normal.   cane  Lab Results  Component Value Date   WBC 6.9 12/28/2016   HGB 13.7 12/28/2016   HCT 41.1 12/28/2016   PLT 220.0 12/28/2016   GLUCOSE 111 (H) 12/28/2016   CHOL 210 (H) 12/28/2013   TRIG 59.0 12/28/2013   HDL 63.40 12/28/2013   LDLCALC 135 (H) 12/28/2013   ALT 15 12/28/2016   AST 22 12/28/2016   NA 140 12/28/2016   K 4.6 12/28/2016   CL 101 12/28/2016   CREATININE 1.11 12/28/2016   BUN 24 (H) 12/28/2016   CO2 34 (H) 12/28/2016   TSH 1.10 12/28/2016   PSA 0.84 12/28/2013   INR 1.6 (H) 10/17/2016    No results found.  Assessment & Plan:   There are no diagnoses linked to this encounter. I am having [name] maintain his EPINEPHrine, Cholecalciferol, levothyroxine, carvedilol, apixaban, Ascorbic Acid (VITAMIN C PO), diphenhydrAMINE, multivitamin with minerals, Carboxymethylcellul-Glycerin (LUBRICATING EYE DROPS OP), irbesartan, torsemide, and irbesartan.  No orders of the defined types were placed in this encounter.    Follow-up: No Follow-up on file.  Walker Kehr, MD

## 2017-04-29 NOTE — Assessment & Plan Note (Signed)
Doing well 

## 2017-04-29 NOTE — Assessment & Plan Note (Signed)
Coreg, Demadex, Losartan 

## 2017-04-29 NOTE — Assessment & Plan Note (Signed)
Coreg, Eliquis, Losartan

## 2017-04-29 NOTE — Assessment & Plan Note (Signed)
Coreg, Demadex, Eliquis, Losartan

## 2017-04-30 ENCOUNTER — Other Ambulatory Visit: Payer: Self-pay | Admitting: Dermatology

## 2017-04-30 ENCOUNTER — Ambulatory Visit (INDEPENDENT_AMBULATORY_CARE_PROVIDER_SITE_OTHER): Payer: Medicare HMO | Admitting: *Deleted

## 2017-04-30 DIAGNOSIS — I482 Chronic atrial fibrillation, unspecified: Secondary | ICD-10-CM

## 2017-04-30 DIAGNOSIS — D044 Carcinoma in situ of skin of scalp and neck: Secondary | ICD-10-CM | POA: Diagnosis not present

## 2017-04-30 DIAGNOSIS — I442 Atrioventricular block, complete: Secondary | ICD-10-CM | POA: Diagnosis not present

## 2017-04-30 DIAGNOSIS — L821 Other seborrheic keratosis: Secondary | ICD-10-CM | POA: Diagnosis not present

## 2017-04-30 DIAGNOSIS — D229 Melanocytic nevi, unspecified: Secondary | ICD-10-CM | POA: Diagnosis not present

## 2017-04-30 DIAGNOSIS — D0439 Carcinoma in situ of skin of other parts of face: Secondary | ICD-10-CM | POA: Diagnosis not present

## 2017-05-01 LAB — CUP PACEART REMOTE DEVICE CHECK
Date Time Interrogation Session: 20180815050253
Implantable Lead Implant Date: 19980824
Implantable Pulse Generator Implant Date: 20180214
Lead Channel Impedance Value: 380 Ohm
Lead Channel Pacing Threshold Amplitude: 0.5 V
Lead Channel Pacing Threshold Pulse Width: 0.8 ms
Lead Channel Sensing Intrinsic Amplitude: 12 mV
MDC IDC LEAD IMPLANT DT: 19980824
MDC IDC LEAD LOCATION: 753859
MDC IDC LEAD LOCATION: 753860
MDC IDC MSMT BATTERY REMAINING LONGEVITY: 95 mo
MDC IDC MSMT BATTERY REMAINING PERCENTAGE: 95.5 %
MDC IDC MSMT BATTERY VOLTAGE: 2.98 V
MDC IDC SET LEADCHNL RV PACING AMPLITUDE: 2.5 V
MDC IDC SET LEADCHNL RV PACING PULSEWIDTH: 0.8 ms
MDC IDC SET LEADCHNL RV SENSING SENSITIVITY: 6 mV
MDC IDC STAT BRADY RV PERCENT PACED: 99 %
Pulse Gen Model: 2272
Pulse Gen Serial Number: 7998385

## 2017-05-01 NOTE — Progress Notes (Signed)
Remote pacemaker check. 

## 2017-05-13 ENCOUNTER — Telehealth: Payer: Self-pay | Admitting: Internal Medicine

## 2017-05-13 NOTE — Telephone Encounter (Signed)
Will forward to Dr. Klein and his nurse. 

## 2017-05-13 NOTE — Telephone Encounter (Signed)
New Message  Pt call requesting to speak with RN about getting a clearance for a dermatologist procedure. Pt was informed it is an office policy not to take surgical clearance from pt and the office who is doing the procedure is going to need to call the office. Pt states the dermatologist office does not give calls out for clearances and to just let Dr [name] know about his procedure. Please cal back to discuss

## 2017-05-14 ENCOUNTER — Encounter: Payer: Self-pay | Admitting: Cardiology

## 2017-05-15 NOTE — Telephone Encounter (Signed)
I spoke with the patient. I inquired what dermatology procedure he was having done and he stated "squamous cell removal." He is due for this on 05/30/17 and was needing to know if he could come off his blood thinner for a day.  I advised the patient I would need to contact his dermatology office to see exactly what he is having done and what requests they are making. Per the patient, he states the dermatology office told him that holding his blood thinner would be between the patient and his cardiologist.  I have contacted Dr. Ray Church office at [phone] to see what the patient is having done and what the request is for his blood thinner.   Per staff answering the phone, there is not a nurse available at this time to speak with, he will have someone call me back.

## 2017-05-17 NOTE — Telephone Encounter (Signed)
Late entry: spoke with Dr. Onalee Hua staff on 8/29 - late afternoon:  Per staff, the patient had 2 areas biopsied on 04/30/17 on the left scalp and cheek. He is scheduled to come back on 05/30/17- he will have these areas numbed and scraped- if superficial, he will possibly need no stitches, but if deep, he will need stitches. However, there is no need to hold blood thinner for this.   I have called the patient and notified him of this.  He is aware that he does not need to stop this for the procedure.

## 2017-06-25 ENCOUNTER — Ambulatory Visit (INDEPENDENT_AMBULATORY_CARE_PROVIDER_SITE_OTHER): Payer: Medicare HMO

## 2017-06-25 DIAGNOSIS — Z23 Encounter for immunization: Secondary | ICD-10-CM | POA: Diagnosis not present

## 2017-07-03 ENCOUNTER — Encounter: Payer: Self-pay | Admitting: Family Medicine

## 2017-07-03 ENCOUNTER — Ambulatory Visit (INDEPENDENT_AMBULATORY_CARE_PROVIDER_SITE_OTHER)
Admission: RE | Admit: 2017-07-03 | Discharge: 2017-07-03 | Disposition: A | Discharge status: Home / Self Care | Payer: Medicare HMO | Source: Ambulatory Visit | Attending: Family Medicine | Admitting: Family Medicine

## 2017-07-03 ENCOUNTER — Ambulatory Visit (INDEPENDENT_AMBULATORY_CARE_PROVIDER_SITE_OTHER): Payer: Medicare HMO | Admitting: Family Medicine

## 2017-07-03 VITALS — BP 150/90 | HR 65 | Temp 97.7°F | Ht 70.0 in | Wt 167.0 lb

## 2017-07-03 DIAGNOSIS — T148XXA Other injury of unspecified body region, initial encounter: Secondary | ICD-10-CM | POA: Diagnosis not present

## 2017-07-03 DIAGNOSIS — M25552 Pain in left hip: Secondary | ICD-10-CM

## 2017-07-03 DIAGNOSIS — Z7901 Long term (current) use of anticoagulants: Secondary | ICD-10-CM

## 2017-07-03 DIAGNOSIS — S300XXA Contusion of lower back and pelvis, initial encounter: Secondary | ICD-10-CM

## 2017-07-03 DIAGNOSIS — W01190A Fall on same level from slipping, tripping and stumbling with subsequent striking against furniture, initial encounter: Secondary | ICD-10-CM | POA: Diagnosis not present

## 2017-07-03 DIAGNOSIS — S79912A Unspecified injury of left hip, initial encounter: Secondary | ICD-10-CM | POA: Diagnosis not present

## 2017-07-03 NOTE — Progress Notes (Signed)
Ricky [name] - 83 y.o. male MRN [id number]  Date of birth: 25-Sep-1922  SUBJECTIVE:  Including CC & ROS.  Chief Complaint  Patient presents with  . Fall    patient states that he fell last thursday when he walked into the garage after being outside. patient states his feet were wet and that is why he fell. fell on left hip and now it seems to be getting more sore than it was before     Ricky [name] is a 83 year old male that is presenting with left buttock pain after a fall. He fell Thursday on the concrete. He was outside during the rain and then slid on the concrete after going into the garage. He's had some bruising on his buttock. He takes eliquis for afib. Denies any problems out of the ordinary with his walking. Walks with cane.   Review of his blood work from April 2018 shows normal renal function, normal hemoglobin and platelets.   Independent review of his left hip x-ray from 2014 shows normal exam.  Review of Systems  Musculoskeletal: Positive for gait problem.  Skin: Positive for color change.  Hematological: Bruises/bleeds easily.    HISTORY: Past Medical, Surgical, Social, and Family History Reviewed & Updated per EMR.   Pertinent Historical Findings include:  Past Medical History:  Diagnosis Date  . ANXIETY 04/26/2008  . Atrial fibrillation ([place]) 10/31/2010   Coumadin therapy;  Echo 6/12: EF 50-55%, moderate MR, moderate TR, PASP 52, mild LAE  . BPH (benign prostatic hypertrophy)   . Essential hypertension, benign 10/03/2007  . HTN (hypertension) 06/07/2013  . HYPOTHYROIDISM 09/04/2007  . LYMPHADENOPATHY 04/06/2009  . Nocturia   . Pacemaker    Status post AV nodal ablation  . TOBACCO USE, QUIT 07/14/2009    Past Surgical History:  Procedure Laterality Date  . COLONOSCOPY    . EYE SURGERY  2000   eye muscle release  . EYE SURGERY     both cataracts  . HERNIA REPAIR    . INGUINAL HERNIA REPAIR Right 05/06/2013   Procedure: HERNIA REPAIR INGUINAL ADULT;  Surgeon:  Haywood Lasso, MD;  Location: [facility];  Service: General;  Laterality: Right;  . PACEMAKER INSERTION  10/16/05  . PPM GENERATOR CHANGEOUT N/A 10/31/2016   Procedure: PPM Generator Changeout;  Surgeon: Deboraha Sprang, MD;  Location: [facility];  Service: Cardiovascular;  Laterality: N/A;    Allergies  Allergen Reactions  . Bee Venom Anaphylaxis  . Amiodarone Hcl     Weakness in muscles   . Atorvastatin     unknown  . Benazepril Hcl     unknown  . Clonidine Hydrochloride     unknown  . Colesevelam     unknown  . Diltiazem Hcl     unknown  . Ezetimibe     Unknown   . Verapamil Other (See Comments)    unknown  . Amoxicillin Rash    Has patient had a PCN reaction causing immediate rash, facial/tongue/throat swelling, SOB or lightheadedness with hypotension: Yes Has patient had a PCN reaction causing severe rash involving mucus membranes or skin necrosis: No Has patient had a PCN reaction that required hospitalization Unknow Has patient had a PCN reaction occurring within the last 10 years: Unknown If all of the above answers are "NO", then may proceed with Cephalosporin use.     Family History  Problem Relation Age of Onset  . Hypertension Mother   . Cancer Mother  uterine/cervical  . Heart disease Father   . Hypertension Other      Social History   Social History  . Marital status: Widowed    Spouse name: N/A  . Number of children: N/A  . Years of education: N/A   Occupational History  . Not on file.   Social History Main Topics  . Smoking status: Former Smoker    Packs/day: 1.00    Years: 30.00    Types: Cigarettes    Quit date: 05/01/1979  . Smokeless tobacco: Never Used  . Alcohol use No  . Drug use: No  . Sexual activity: No   Other Topics Concern  . Not on file   Social History Narrative   Lost his wife 2002 ; Her health was not good.        PHYSICAL EXAM:  VS: BP (!) 150/90 (BP Location: Left Arm, Patient  Position: Sitting, Cuff Size: Normal)   Pulse 65   Temp 97.7 F (36.5 C) (Oral)   Ht 5\' 10"  (1.778 m)   Wt 167 lb (75.8 kg)   SpO2 100%   BMI 23.96 kg/m  Physical Exam Gen: NAD, alert, cooperative with exam,  ENT: normal lips, normal nasal mucosa,  Eye: normal EOM, normal conjunctiva and lids CV:  no edema, +2 pedal pulses   Resp: no accessory muscle use, non-labored,  Skin: no rashes, no areas of induration  Neuro: normal tone, normal sensation to touch Psych:  normal insight, alert and oriented MSK:  Left hip: Area of ecchymosis and hematoma development in his left inferior buttock. Normal internal and external rotation of the hip. Normal strength with hip flexion to resistance. Normal strength to resistance with knee flexion and extension. Able to ambulate with a cane  Neurovascularly intact    ASSESSMENT & PLAN:   Left hip pain Does not appear to be fracture on exam. Does not appear to be an avulsion fracture off the issue tuberosity with good strength to resistance with knee flexion. Has had a hematoma development and is on chronic anticoagulation. - X-rays today - Can take Tylenol for pain - May need to consider physical therapy to help with strength and balance.  Hematoma Has had a hematoma develop. Is on chronic anticoagulation - Advised heat massage this area. - If having any mechanical symptoms such as sciatica and may need to evacuate it at some point.

## 2017-07-03 NOTE — Assessment & Plan Note (Signed)
Has had a hematoma develop. Is on chronic anticoagulation - Advised heat massage this area. - If having any mechanical symptoms such as sciatica and may need to evacuate it at some point.

## 2017-07-03 NOTE — Patient Instructions (Addendum)
Thank you for coming in,   We will call you with the results from today.    Please feel free to call with any questions or concerns at any time, at [phone]. --Dr. Raeford Razor  Hematoma A hematoma is a collection of blood. The collection of blood can turn into a hard, painful lump under the skin. Your skin may turn blue or yellow if the hematoma is close to the surface of the skin. Most hematomas get better in a few days to weeks. Some hematomas are serious and need medical care. Hematomas can be very small or very big. Follow these instructions at home:  Apply ice to the injured area: ? Put ice in a plastic bag. ? Place a towel between your skin and the bag. ? Leave the ice on for 20 minutes, 2-3 times a day for the first 1 to 2 days.  After the first 2 days, switch to using warm packs on the injured area.  Raise (elevate) the injured area to lessen pain and puffiness (swelling). You may also wrap the area with an elastic bandage. Make sure the bandage is not wrapped too tight.  If you have a painful hematoma on your leg or foot, you may use crutches for a couple days.  Only take medicines as told by your doctor. Get help right away if:  Your pain gets worse.  Your pain is not controlled with medicine.  You have a fever.  Your puffiness gets worse.  Your skin turns more blue or yellow.  Your skin over the hematoma breaks or starts bleeding.  Your hematoma is in your chest or belly (abdomen) and you are short of breath, feel weak, or have a change in consciousness.  Your hematoma is on your scalp and you have a headache that gets worse or a change in alertness or consciousness. This information is not intended to replace advice given to you by your health care provider. Make sure you discuss any questions you have with your health care provider. Document Released: 10/11/2004 Document Revised: 02/09/2016 Document Reviewed: 02/11/2013 Elsevier Interactive Patient Education   2017 [employer].

## 2017-07-03 NOTE — Assessment & Plan Note (Signed)
Does not appear to be fracture on exam. Does not appear to be an avulsion fracture off the issue tuberosity with good strength to resistance with knee flexion. Has had a hematoma development and is on chronic anticoagulation. - X-rays today - Can take Tylenol for pain - May need to consider physical therapy to help with strength and balance.

## 2017-07-04 ENCOUNTER — Telehealth: Payer: Self-pay | Admitting: Family Medicine

## 2017-07-04 ENCOUNTER — Telehealth: Payer: Self-pay

## 2017-07-04 NOTE — Telephone Encounter (Signed)
CRITICAL VALUE STICKER  CRITICAL VALUE: Question nondisplaced femoral neck fracture. CT or MRI could be used to further evaluate.  RECEIVER (on-site recipient of call): Raynald Kemp, [place] NOTIFIED: 0900  MESSENGER (representative from lab): Jane-GI  MD NOTIFIED: Raeford Razor  TIME OF NOTIFICATION:0901  RESPONSE:

## 2017-07-04 NOTE — Telephone Encounter (Signed)
Spoke with patient about his x-ray results. Does not show definitive fracture. Had a discussion with the radiologist about his findings. Advised that he will follow-up in 3 weeks. If he is still having pain at that time may need to order a CT without contrast or an MRI.  Rosemarie Ax, MD [facility] 07/04/2017, 1:48 PM

## 2017-07-30 ENCOUNTER — Telehealth: Payer: Self-pay | Admitting: Cardiology

## 2017-07-30 ENCOUNTER — Ambulatory Visit (INDEPENDENT_AMBULATORY_CARE_PROVIDER_SITE_OTHER): Payer: Medicare HMO | Admitting: *Deleted

## 2017-07-30 ENCOUNTER — Other Ambulatory Visit (INDEPENDENT_AMBULATORY_CARE_PROVIDER_SITE_OTHER): Payer: Medicare HMO

## 2017-07-30 ENCOUNTER — Encounter: Payer: Self-pay | Admitting: Internal Medicine

## 2017-07-30 ENCOUNTER — Ambulatory Visit (INDEPENDENT_AMBULATORY_CARE_PROVIDER_SITE_OTHER): Payer: Medicare HMO | Admitting: Internal Medicine

## 2017-07-30 DIAGNOSIS — I10 Essential (primary) hypertension: Secondary | ICD-10-CM

## 2017-07-30 DIAGNOSIS — E039 Hypothyroidism, unspecified: Secondary | ICD-10-CM

## 2017-07-30 DIAGNOSIS — I480 Paroxysmal atrial fibrillation: Secondary | ICD-10-CM

## 2017-07-30 DIAGNOSIS — Z950 Presence of cardiac pacemaker: Secondary | ICD-10-CM

## 2017-07-30 DIAGNOSIS — I442 Atrioventricular block, complete: Secondary | ICD-10-CM

## 2017-07-30 LAB — BASIC METABOLIC PANEL
BUN: 22 mg/dL (ref 6–23)
CHLORIDE: 100 meq/L (ref 96–112)
CO2: 33 meq/L — AB (ref 19–32)
Calcium: 9.2 mg/dL (ref 8.4–10.5)
Creatinine, Ser: 0.99 mg/dL (ref 0.40–1.50)
GFR: 75.14 mL/min (ref >60.00)
GLUCOSE: 98 mg/dL (ref 70–99)
POTASSIUM: 4.6 meq/L (ref 3.5–5.1)
SODIUM: 138 meq/L (ref 135–145)

## 2017-07-30 LAB — CBC WITH DIFFERENTIAL/PLATELET
BASOS ABS: 0 10*3/uL (ref 0.0–0.1)
Basophils Relative: 0.5 % (ref 0.0–3.0)
Eosinophils Absolute: 0.1 10*3/uL (ref 0.0–0.7)
Eosinophils Relative: 2 % (ref 0.0–5.0)
HEMATOCRIT: 42.5 % (ref 39.0–52.0)
HEMOGLOBIN: 14.1 g/dL (ref 13.0–17.0)
LYMPHS PCT: 23.9 % (ref 12.0–46.0)
Lymphs Abs: 1.6 10*3/uL (ref 0.7–4.0)
MCHC: 33.2 g/dL (ref 30.0–36.0)
MCV: 104 fl — ABNORMAL HIGH (ref 78.0–100.0)
MONOS PCT: 9 % (ref 3.0–12.0)
Monocytes Absolute: 0.6 10*3/uL (ref 0.1–1.0)
NEUTROS ABS: 4.2 10*3/uL (ref 1.4–7.7)
Neutrophils Relative %: 64.6 % (ref 43.0–77.0)
PLATELETS: 222 10*3/uL (ref 150.0–400.0)
RBC: 4.09 Mil/uL — AB (ref 4.22–5.81)
RDW: 13.8 % (ref 11.5–15.5)
WBC: 6.5 10*3/uL (ref 4.0–10.5)

## 2017-07-30 LAB — TSH: TSH: 1.11 u[IU]/mL (ref 0.35–4.50)

## 2017-07-30 NOTE — Assessment & Plan Note (Signed)
On Eliquis 5 mg bid. May need to switch to 2.5 mg bid in the future. Monitoring creatinine

## 2017-07-30 NOTE — Patient Instructions (Signed)
MC well w/Jill 

## 2017-07-30 NOTE — Progress Notes (Signed)
Remote pacemaker transmission.   

## 2017-07-30 NOTE — Assessment & Plan Note (Signed)
Doing well 

## 2017-07-30 NOTE — Assessment & Plan Note (Signed)
Coreg, Demadex, Losartan 

## 2017-07-30 NOTE — Telephone Encounter (Signed)
Spoke with pt and reminded pt of remote transmission that is due today. Pt verbalized understanding.   

## 2017-07-30 NOTE — Assessment & Plan Note (Signed)
On Synthroid

## 2017-07-30 NOTE — Progress Notes (Signed)
Subjective:  Patient ID: Ricky [name], male    DOB: December 18, 1922  Age: 83 y.o. MRN: [id number]  CC: No chief complaint on file.   HPI Ricky [name] presents for A fib, CAD, HTN f/u    Outpatient Medications Prior to Visit  Medication Sig Dispense Refill  . apixaban (ELIQUIS) 5 MG TABS tablet Take 1 tablet (5 mg total) by mouth 2 (two) times daily. 180 tablet 3  . Ascorbic Acid (VITAMIN C PO) Take 1 tablet by mouth daily.    . Carboxymethylcellul-Glycerin (LUBRICATING EYE DROPS OP) Apply 1 drop to eye 5 (five) times daily.    . Cholecalciferol 1000 UNITS tablet Take 1,000 Units by mouth daily. Reported on 12/23/2015    . EPINEPHrine (EPIPEN 2-PAK) 0.3 mg/0.3 mL DEVI Inject 0.3 mg into the muscle daily as needed (allergic reaction). For allergic reaction    . irbesartan (AVAPRO) 150 MG tablet Take 150 mg by mouth at bedtime.    . irbesartan (AVAPRO) 300 MG tablet TAKE ONE TABLET BY MOUTH AT BEDTIME 90 tablet 1  . levothyroxine (SYNTHROID, LEVOTHROID) 75 MCG tablet Take 75 mcg by mouth daily.    . Multiple Vitamin (MULTIVITAMIN WITH MINERALS) TABS tablet Take 1 tablet by mouth 3 (three) times a week.    . torsemide (DEMADEX) 10 MG tablet Take 3 tablets (30 mg) by mouth in the AM and 1 tablet (10 mg) by mouth in the PM 360 tablet 3  . carvedilol (COREG) 25 MG tablet Take 1 tablet (25 mg total) by mouth 2 (two) times daily. 180 tablet 3  . diphenhydrAMINE (BENADRYL) 25 mg capsule Take 1-2 capsules (25-50 mg total) by mouth every 4 (four) hours as needed for itching or allergies (1-2 for bee stings). 60 capsule 2   No facility-administered medications prior to visit.     ROS Review of Systems  Constitutional: Negative for appetite change, fatigue and unexpected weight change.  HENT: Negative for congestion, nosebleeds, sneezing, sore throat and trouble swallowing.   Eyes: Positive for visual disturbance. Negative for itching.  Respiratory: Negative for cough.   Cardiovascular:  Negative for chest pain, palpitations and leg swelling.  Gastrointestinal: Negative for abdominal distention, blood in stool, diarrhea and nausea.  Genitourinary: Negative for frequency and hematuria.  Musculoskeletal: Positive for gait problem. Negative for back pain, joint swelling and neck pain.  Skin: Negative for rash.  Neurological: Negative for dizziness, tremors, speech difficulty and weakness.  Psychiatric/Behavioral: Negative for agitation, dysphoric mood and sleep disturbance. The patient is not nervous/anxious.     Objective:  BP 130/88   Pulse 64   SpO2 97%   BP Readings from Last 3 Encounters:  07/30/17 130/88  07/03/17 (!) 150/90  04/29/17 (!) 142/80    Wt Readings from Last 3 Encounters:  07/03/17 167 lb (75.8 kg)  04/29/17 163 lb (73.9 kg)  01/29/17 167 lb (75.8 kg)    Physical Exam  Constitutional: He is oriented to person, place, and time. He appears well-developed. No distress.  NAD  HENT:  Mouth/Throat: Oropharynx is clear and moist.  Eyes: Conjunctivae are normal. Pupils are equal, round, and reactive to light.  Neck: Normal range of motion. No JVD present. No thyromegaly present.  Cardiovascular: Normal rate, regular rhythm, normal heart sounds and intact distal pulses. Exam reveals no gallop and no friction rub.  No murmur heard. Pulmonary/Chest: Effort normal and breath sounds normal. No respiratory distress. He has no wheezes. He has no rales. He exhibits no tenderness.  Abdominal: Soft. Bowel sounds are normal. He exhibits no distension and no mass. There is no tenderness. There is no rebound and no guarding.  Musculoskeletal: Normal range of motion. He exhibits no edema or tenderness.  Lymphadenopathy:    He has no cervical adenopathy.  Neurological: He is alert and oriented to person, place, and time. He has normal reflexes. No cranial nerve deficit. He exhibits normal muscle tone. He displays a negative Romberg sign. Coordination abnormal. Gait  normal.  Skin: Skin is warm and dry. No rash noted.  Psychiatric: He has a normal mood and affect. His behavior is normal. Judgment and thought content normal.  Cane Dark glasses  Lab Results  Component Value Date   WBC 6.9 12/28/2016   HGB 13.7 12/28/2016   HCT 41.1 12/28/2016   PLT 220.0 12/28/2016   GLUCOSE 111 (H) 12/28/2016   CHOL 210 (H) 12/28/2013   TRIG 59.0 12/28/2013   HDL 63.40 12/28/2013   LDLCALC 135 (H) 12/28/2013   ALT 15 12/28/2016   AST 22 12/28/2016   NA 140 12/28/2016   K 4.6 12/28/2016   CL 101 12/28/2016   CREATININE 1.11 12/28/2016   BUN 24 (H) 12/28/2016   CO2 34 (H) 12/28/2016   TSH 1.10 12/28/2016   PSA 0.84 12/28/2013   INR 1.6 (H) 10/17/2016    Dg Hip Unilat W Or W/o Pelvis 2-3 Views Left  Result Date: 07/03/2017 CLINICAL DATA:  Hip pain after fall 1 week ago. EXAM: DG HIP (WITH OR WITHOUT PELVIS) 2-3V LEFT COMPARISON:  None. FINDINGS: Bones are demineralized. AP and frog-leg lateral views of the left hip show a sclerotic density in the femoral neck with a question of cortical off step at the junction of the femoral head and neck on the AP film. SI joints are unremarkable. Symphysis pubis has normal imaging features. No evidence for pubic ramus fracture. IMPRESSION: Question nondisplaced femoral neck fracture. CT or MRI could be used to further evaluate. Electronically Signed   By: Misty Stanley M.D.   On: 07/03/2017 16:40    Assessment & Plan:   There are no diagnoses linked to this encounter. I am having Menelik L. Noberto Retort maintain his EPINEPHrine, Cholecalciferol, levothyroxine, carvedilol, apixaban, Ascorbic Acid (VITAMIN C PO), diphenhydrAMINE, multivitamin with minerals, Carboxymethylcellul-Glycerin (LUBRICATING EYE DROPS OP), irbesartan, irbesartan, and torsemide.  No orders of the defined types were placed in this encounter.    Follow-up: No Follow-up on file.  Walker Kehr, MD

## 2017-07-31 LAB — CUP PACEART REMOTE DEVICE CHECK
Battery Remaining Percentage: 95.5 %
Brady Statistic RV Percent Paced: 99 %
Date Time Interrogation Session: 20181113161713
Implantable Pulse Generator Implant Date: 20180214
Lead Channel Impedance Value: 380 Ohm
Lead Channel Setting Pacing Amplitude: 2.5 V
MDC IDC LEAD IMPLANT DT: 19980824
MDC IDC LEAD IMPLANT DT: 19980824
MDC IDC LEAD LOCATION: 753859
MDC IDC LEAD LOCATION: 753860
MDC IDC MSMT BATTERY REMAINING LONGEVITY: 94 mo
MDC IDC MSMT BATTERY VOLTAGE: 2.98 V
MDC IDC MSMT LEADCHNL RV PACING THRESHOLD AMPLITUDE: 0.5 V
MDC IDC MSMT LEADCHNL RV PACING THRESHOLD PULSEWIDTH: 0.8 ms
MDC IDC MSMT LEADCHNL RV SENSING INTR AMPL: 6.1 mV
MDC IDC SET LEADCHNL RV PACING PULSEWIDTH: 0.8 ms
MDC IDC SET LEADCHNL RV SENSING SENSITIVITY: 6 mV
Pulse Gen Model: 2272
Pulse Gen Serial Number: 7998385

## 2017-08-02 ENCOUNTER — Encounter: Payer: Self-pay | Admitting: Cardiology

## 2017-08-21 ENCOUNTER — Telehealth: Payer: Self-pay | Admitting: *Deleted

## 2017-08-21 NOTE — Telephone Encounter (Signed)
Ricky [name] made aware that his monitor is connected and communicating. His transmissions should come through automatically and he will be notified to send a manual transmission if not. He verbalizes understanding and is appreciative.

## 2017-08-21 NOTE — Telephone Encounter (Signed)
°  1. Has your device fired? no  2. Is you device beeping? no  3. Are you experiencing draining or swelling at device site? no  4. Are you calling to see if we received your device transmission? no  5. Have you passed out? No  Patient needs instructions on how to send in transmission.    Please route to [place]

## 2017-08-29 DIAGNOSIS — D044 Carcinoma in situ of skin of scalp and neck: Secondary | ICD-10-CM | POA: Diagnosis not present

## 2017-08-29 DIAGNOSIS — D0439 Carcinoma in situ of skin of other parts of face: Secondary | ICD-10-CM | POA: Diagnosis not present

## 2017-10-03 ENCOUNTER — Encounter: Payer: Self-pay | Admitting: Physician Assistant

## 2017-10-29 ENCOUNTER — Ambulatory Visit (INDEPENDENT_AMBULATORY_CARE_PROVIDER_SITE_OTHER): Payer: Medicare HMO | Admitting: *Deleted

## 2017-10-29 DIAGNOSIS — I442 Atrioventricular block, complete: Secondary | ICD-10-CM | POA: Diagnosis not present

## 2017-10-29 NOTE — Progress Notes (Signed)
Remote pacemaker transmission.   

## 2017-10-30 ENCOUNTER — Encounter: Payer: Self-pay | Admitting: Cardiology

## 2017-10-30 DIAGNOSIS — R69 Illness, unspecified: Secondary | ICD-10-CM | POA: Diagnosis not present

## 2017-11-01 ENCOUNTER — Encounter: Payer: Medicare HMO | Admitting: Physician Assistant

## 2017-11-08 LAB — CUP PACEART REMOTE DEVICE CHECK
Battery Voltage: 2.98 V
Brady Statistic RV Percent Paced: 99 %
Implantable Lead Implant Date: 19980824
Implantable Lead Location: 753859
Implantable Lead Location: 753860
Lead Channel Impedance Value: 380 Ohm
Lead Channel Pacing Threshold Amplitude: 0.5 V
Lead Channel Sensing Intrinsic Amplitude: 12 mV
Lead Channel Setting Pacing Amplitude: 2.5 V
Lead Channel Setting Pacing Pulse Width: 0.8 ms
Lead Channel Setting Sensing Sensitivity: 6 mV
MDC IDC LEAD IMPLANT DT: 19980824
MDC IDC MSMT BATTERY REMAINING LONGEVITY: 92 mo
MDC IDC MSMT BATTERY REMAINING PERCENTAGE: 95.5 %
MDC IDC MSMT LEADCHNL RV PACING THRESHOLD PULSEWIDTH: 0.8 ms
MDC IDC PG IMPLANT DT: 20180214
MDC IDC PG SERIAL: 7998385
MDC IDC SESS DTM: 20190212070031

## 2017-11-27 ENCOUNTER — Ambulatory Visit: Payer: Medicare HMO | Admitting: Internal Medicine

## 2017-12-02 ENCOUNTER — Ambulatory Visit: Payer: Medicare HMO | Admitting: Internal Medicine

## 2017-12-16 ENCOUNTER — Other Ambulatory Visit (INDEPENDENT_AMBULATORY_CARE_PROVIDER_SITE_OTHER): Payer: Medicare HMO

## 2017-12-16 ENCOUNTER — Ambulatory Visit (INDEPENDENT_AMBULATORY_CARE_PROVIDER_SITE_OTHER): Payer: Medicare HMO | Admitting: Internal Medicine

## 2017-12-16 ENCOUNTER — Encounter: Payer: Self-pay | Admitting: Internal Medicine

## 2017-12-16 DIAGNOSIS — M545 Low back pain, unspecified: Secondary | ICD-10-CM

## 2017-12-16 DIAGNOSIS — R270 Ataxia, unspecified: Secondary | ICD-10-CM | POA: Diagnosis not present

## 2017-12-16 DIAGNOSIS — I480 Paroxysmal atrial fibrillation: Secondary | ICD-10-CM | POA: Diagnosis not present

## 2017-12-16 DIAGNOSIS — J189 Pneumonia, unspecified organism: Secondary | ICD-10-CM

## 2017-12-16 DIAGNOSIS — I5022 Chronic systolic (congestive) heart failure: Secondary | ICD-10-CM

## 2017-12-16 DIAGNOSIS — I10 Essential (primary) hypertension: Secondary | ICD-10-CM | POA: Diagnosis not present

## 2017-12-16 HISTORY — DX: Pneumonia, unspecified organism: J18.9

## 2017-12-16 LAB — BASIC METABOLIC PANEL
BUN: 28 mg/dL — AB (ref 6–23)
CALCIUM: 9.4 mg/dL (ref 8.4–10.5)
CO2: 33 mEq/L — ABNORMAL HIGH (ref 19–32)
Chloride: 100 mEq/L (ref 96–112)
Creatinine, Ser: 0.95 mg/dL (ref 0.40–1.50)
GFR: 78.74 mL/min (ref >60.00)
Glucose, Bld: 104 mg/dL — ABNORMAL HIGH (ref 70–99)
Potassium: 4.8 mEq/L (ref 3.5–5.1)
Sodium: 140 mEq/L (ref 135–145)

## 2017-12-16 NOTE — Assessment & Plan Note (Signed)
Coreg, Demadex, Losartan 

## 2017-12-16 NOTE — Patient Instructions (Signed)
Try Coreg 1/2 tablet x2-3 days to see if you are feeling stronger. If not - re-start 1 twice a day

## 2017-12-16 NOTE — Assessment & Plan Note (Signed)
Coreg, Demadex, Losartan, Eliquis

## 2017-12-16 NOTE — Assessment & Plan Note (Addendum)
Coreg, Demadex, Losartan, Eliquis Try Coreg 1/2 tablet x2-3 days to see if you are feeling stronger. If not - re-start 1 twice a day

## 2017-12-16 NOTE — Assessment & Plan Note (Addendum)
Cane PT discussed - declined Try Coreg 1/2 tablet x2-3 days to see if you are feeling stronger. If not - re-start 1 twice a day

## 2017-12-16 NOTE — Assessment & Plan Note (Signed)
Cane

## 2017-12-16 NOTE — Progress Notes (Signed)
Subjective:  Patient ID: Ricky [name], male    DOB: 1923-01-17  Age: 84 y.o. MRN: [id number]  CC: No chief complaint on file.   HPI Ricky [name] presents for anticoagulation, gait disorder, OA f/u C/o legs getting weaker...  Outpatient Medications Prior to Visit  Medication Sig Dispense Refill  . apixaban (ELIQUIS) 5 MG TABS tablet Take 1 tablet (5 mg total) by mouth 2 (two) times daily. 180 tablet 3  . Ascorbic Acid (VITAMIN C PO) Take 1 tablet by mouth daily.    . Carboxymethylcellul-Glycerin (LUBRICATING EYE DROPS OP) Apply 1 drop to eye 5 (five) times daily.    . Cholecalciferol 1000 UNITS tablet Take 1,000 Units by mouth daily. Reported on 12/23/2015    . EPINEPHrine (EPIPEN 2-PAK) 0.3 mg/0.3 mL DEVI Inject 0.3 mg into the muscle daily as needed (allergic reaction). For allergic reaction    . irbesartan (AVAPRO) 300 MG tablet TAKE ONE TABLET BY MOUTH AT BEDTIME 90 tablet 1  . levothyroxine (SYNTHROID, LEVOTHROID) 75 MCG tablet Take 75 mcg by mouth daily.    . Multiple Vitamin (MULTIVITAMIN WITH MINERALS) TABS tablet Take 1 tablet by mouth 3 (three) times a week.    . torsemide (DEMADEX) 10 MG tablet Take 3 tablets (30 mg) by mouth in the AM and 1 tablet (10 mg) by mouth in the PM 360 tablet 3  . carvedilol (COREG) 25 MG tablet Take 1 tablet (25 mg total) by mouth 2 (two) times daily. 180 tablet 3  . diphenhydrAMINE (BENADRYL) 25 mg capsule Take 1-2 capsules (25-50 mg total) by mouth every 4 (four) hours as needed for itching or allergies (1-2 for bee stings). 60 capsule 2   No facility-administered medications prior to visit.     ROS Review of Systems  Constitutional: Negative for appetite change, fatigue and unexpected weight change.  HENT: Negative for congestion, nosebleeds, sneezing, sore throat and trouble swallowing.   Eyes: Negative for itching and visual disturbance.  Respiratory: Negative for cough.   Cardiovascular: Negative for chest pain, palpitations and leg  swelling.  Gastrointestinal: Negative for abdominal distention, blood in stool, diarrhea and nausea.  Genitourinary: Negative for frequency and hematuria.  Musculoskeletal: Positive for gait problem. Negative for back pain, joint swelling and neck pain.  Skin: Negative for rash.  Neurological: Positive for weakness. Negative for dizziness, tremors and speech difficulty.  Psychiatric/Behavioral: Negative for agitation, dysphoric mood, sleep disturbance and suicidal ideas. The patient is not nervous/anxious.     Objective:  BP (!) 148/86 (BP Location: Left Arm, Patient Position: Sitting, Cuff Size: Normal)   Pulse 64   Temp 97.8 F (36.6 C) (Oral)   Ht 5\' 10"  (1.778 m)   Wt 162 lb (73.5 kg)   SpO2 98%   BMI 23.24 kg/m   BP Readings from Last 3 Encounters:  12/16/17 (!) 148/86  07/30/17 130/88  07/03/17 (!) 150/90    Wt Readings from Last 3 Encounters:  12/16/17 162 lb (73.5 kg)  07/03/17 167 lb (75.8 kg)  04/29/17 163 lb (73.9 kg)    Physical Exam  Constitutional: He is oriented to person, place, and time. He appears well-developed. No distress.  NAD  HENT:  Mouth/Throat: Oropharynx is clear and moist.  Eyes: Pupils are equal, round, and reactive to light. Conjunctivae are normal.  Neck: Normal range of motion. No JVD present. No thyromegaly present.  Cardiovascular: Normal rate, regular rhythm, normal heart sounds and intact distal pulses. Exam reveals no gallop and no friction  rub.  No murmur heard. Pulmonary/Chest: Effort normal and breath sounds normal. No respiratory distress. He has no wheezes. He has no rales. He exhibits no tenderness.  Abdominal: Soft. Bowel sounds are normal. He exhibits no distension and no mass. There is no tenderness. There is no rebound and no guarding.  Musculoskeletal: Normal range of motion. He exhibits no edema or tenderness.  Lymphadenopathy:    He has no cervical adenopathy.  Neurological: He is alert and oriented to person, place, and  time. He has normal reflexes. No cranial nerve deficit. He exhibits normal muscle tone. He displays a negative Romberg sign. Coordination and gait normal.  Skin: Skin is warm and dry. No rash noted.  Psychiatric: He has a normal mood and affect. His behavior is normal. Judgment and thought content normal.  Cane Ataxic Dark glasses  Lab Results  Component Value Date   WBC 6.5 07/30/2017   HGB 14.1 07/30/2017   HCT 42.5 07/30/2017   PLT 222.0 07/30/2017   GLUCOSE 98 07/30/2017   CHOL 210 (H) 12/28/2013   TRIG 59.0 12/28/2013   HDL 63.40 12/28/2013   LDLCALC 135 (H) 12/28/2013   ALT 15 12/28/2016   AST 22 12/28/2016   NA 138 07/30/2017   K 4.6 07/30/2017   CL 100 07/30/2017   CREATININE 0.99 07/30/2017   BUN 22 07/30/2017   CO2 33 (H) 07/30/2017   TSH 1.11 07/30/2017   PSA 0.84 12/28/2013   INR 1.6 (H) 10/17/2016    Dg Hip Unilat W Or W/o Pelvis 2-3 Views Left  Result Date: 07/03/2017 CLINICAL DATA:  Hip pain after fall 1 week ago. EXAM: DG HIP (WITH OR WITHOUT PELVIS) 2-3V LEFT COMPARISON:  None. FINDINGS: Bones are demineralized. AP and frog-leg lateral views of the left hip show a sclerotic density in the femoral neck with a question of cortical off step at the junction of the femoral head and neck on the AP film. SI joints are unremarkable. Symphysis pubis has normal imaging features. No evidence for pubic ramus fracture. IMPRESSION: Question nondisplaced femoral neck fracture. CT or MRI could be used to further evaluate. Electronically Signed   By: Misty Stanley M.D.   On: 07/03/2017 16:40    Assessment & Plan:   There are no diagnoses linked to this encounter. I am having Ricky [name] maintain his EPINEPHrine, Cholecalciferol, levothyroxine, carvedilol, apixaban, Ascorbic Acid (VITAMIN C PO), diphenhydrAMINE, multivitamin with minerals, Carboxymethylcellul-Glycerin (LUBRICATING EYE DROPS OP), irbesartan, and torsemide.  No orders of the defined types were placed in  this encounter.    Follow-up: No follow-ups on file.  Walker Kehr, MD

## 2017-12-22 ENCOUNTER — Inpatient Hospital Stay (HOSPITAL_COMMUNITY)
Admission: EM | Admit: 2017-12-22 | Discharge: 2017-12-27 | DRG: 193 | Disposition: A | Discharge status: Home Health | Payer: Medicare HMO | Attending: Internal Medicine | Admitting: Internal Medicine

## 2017-12-22 ENCOUNTER — Other Ambulatory Visit: Payer: Self-pay

## 2017-12-22 ENCOUNTER — Encounter (HOSPITAL_COMMUNITY): Payer: Self-pay | Admitting: Emergency Medicine

## 2017-12-22 ENCOUNTER — Emergency Department (HOSPITAL_COMMUNITY): Payer: Medicare HMO

## 2017-12-22 DIAGNOSIS — Z950 Presence of cardiac pacemaker: Secondary | ICD-10-CM | POA: Diagnosis not present

## 2017-12-22 DIAGNOSIS — Z881 Allergy status to other antibiotic agents status: Secondary | ICD-10-CM

## 2017-12-22 DIAGNOSIS — Z86010 Personal history of colonic polyps: Secondary | ICD-10-CM

## 2017-12-22 DIAGNOSIS — Z87891 Personal history of nicotine dependence: Secondary | ICD-10-CM | POA: Diagnosis not present

## 2017-12-22 DIAGNOSIS — R0602 Shortness of breath: Secondary | ICD-10-CM

## 2017-12-22 DIAGNOSIS — Z91030 Bee allergy status: Secondary | ICD-10-CM | POA: Diagnosis not present

## 2017-12-22 DIAGNOSIS — I482 Chronic atrial fibrillation: Secondary | ICD-10-CM | POA: Diagnosis present

## 2017-12-22 DIAGNOSIS — J181 Lobar pneumonia, unspecified organism: Secondary | ICD-10-CM | POA: Diagnosis not present

## 2017-12-22 DIAGNOSIS — Z888 Allergy status to other drugs, medicaments and biological substances status: Secondary | ICD-10-CM | POA: Diagnosis not present

## 2017-12-22 DIAGNOSIS — Z7901 Long term (current) use of anticoagulants: Secondary | ICD-10-CM

## 2017-12-22 DIAGNOSIS — Z79890 Hormone replacement therapy (postmenopausal): Secondary | ICD-10-CM | POA: Diagnosis not present

## 2017-12-22 DIAGNOSIS — G9341 Metabolic encephalopathy: Secondary | ICD-10-CM | POA: Diagnosis not present

## 2017-12-22 DIAGNOSIS — R0600 Dyspnea, unspecified: Secondary | ICD-10-CM | POA: Diagnosis not present

## 2017-12-22 DIAGNOSIS — J189 Pneumonia, unspecified organism: Secondary | ICD-10-CM | POA: Diagnosis present

## 2017-12-22 DIAGNOSIS — Z8249 Family history of ischemic heart disease and other diseases of the circulatory system: Secondary | ICD-10-CM | POA: Diagnosis not present

## 2017-12-22 DIAGNOSIS — Z808 Family history of malignant neoplasm of other organs or systems: Secondary | ICD-10-CM | POA: Diagnosis not present

## 2017-12-22 DIAGNOSIS — R262 Difficulty in walking, not elsewhere classified: Secondary | ICD-10-CM | POA: Diagnosis not present

## 2017-12-22 DIAGNOSIS — E039 Hypothyroidism, unspecified: Secondary | ICD-10-CM | POA: Diagnosis not present

## 2017-12-22 DIAGNOSIS — Z9841 Cataract extraction status, right eye: Secondary | ICD-10-CM

## 2017-12-22 DIAGNOSIS — N400 Enlarged prostate without lower urinary tract symptoms: Secondary | ICD-10-CM | POA: Diagnosis present

## 2017-12-22 DIAGNOSIS — R269 Unspecified abnormalities of gait and mobility: Secondary | ICD-10-CM | POA: Diagnosis not present

## 2017-12-22 DIAGNOSIS — I119 Hypertensive heart disease without heart failure: Secondary | ICD-10-CM | POA: Diagnosis not present

## 2017-12-22 DIAGNOSIS — E876 Hypokalemia: Secondary | ICD-10-CM | POA: Diagnosis not present

## 2017-12-22 DIAGNOSIS — Z9842 Cataract extraction status, left eye: Secondary | ICD-10-CM

## 2017-12-22 DIAGNOSIS — R05 Cough: Secondary | ICD-10-CM

## 2017-12-22 DIAGNOSIS — E8770 Fluid overload, unspecified: Secondary | ICD-10-CM | POA: Diagnosis not present

## 2017-12-22 DIAGNOSIS — I10 Essential (primary) hypertension: Secondary | ICD-10-CM | POA: Diagnosis not present

## 2017-12-22 DIAGNOSIS — R059 Cough, unspecified: Secondary | ICD-10-CM

## 2017-12-22 LAB — CBC WITH DIFFERENTIAL/PLATELET
Basophils Absolute: 0 10*3/uL (ref 0.0–0.1)
Basophils Relative: 0 %
Eosinophils Absolute: 0 10*3/uL (ref 0.0–0.7)
Eosinophils Relative: 0 %
HCT: 41.5 % (ref 39.0–52.0)
Hemoglobin: 13.4 g/dL (ref 13.0–17.0)
Lymphocytes Relative: 8 %
Lymphs Abs: 0.6 10*3/uL — ABNORMAL LOW (ref 0.7–4.0)
MCH: 33.4 pg (ref 26.0–34.0)
MCHC: 32.3 g/dL (ref 30.0–36.0)
MCV: 103.5 fL — ABNORMAL HIGH (ref 78.0–100.0)
Monocytes Absolute: 0.6 10*3/uL (ref 0.1–1.0)
Monocytes Relative: 8 %
Neutro Abs: 6.3 10*3/uL (ref 1.7–7.7)
Neutrophils Relative %: 84 %
Platelets: 189 10*3/uL (ref 150–400)
RBC: 4.01 MIL/uL — ABNORMAL LOW (ref 4.22–5.81)
RDW: 13.9 % (ref 11.5–15.5)
WBC: 7.5 10*3/uL (ref 4.0–10.5)

## 2017-12-22 LAB — BASIC METABOLIC PANEL
Anion gap: 16 — ABNORMAL HIGH (ref 5–15)
BUN: 31 mg/dL — ABNORMAL HIGH (ref 6–20)
CO2: 23 mmol/L (ref 22–32)
Calcium: 8.7 mg/dL — ABNORMAL LOW (ref 8.9–10.3)
Chloride: 101 mmol/L (ref 101–111)
Creatinine, Ser: 1.07 mg/dL (ref 0.61–1.24)
GFR calc Af Amer: >60 mL/min (ref >60)
GFR calc non Af Amer: 58 mL/min — ABNORMAL LOW (ref >60)
Glucose, Bld: 154 mg/dL — ABNORMAL HIGH (ref 65–99)
Potassium: 4 mmol/L (ref 3.5–5.1)
Sodium: 140 mmol/L (ref 135–145)

## 2017-12-22 LAB — URINALYSIS, ROUTINE W REFLEX MICROSCOPIC
Bacteria, UA: NONE SEEN
Bilirubin Urine: NEGATIVE
Glucose, UA: NEGATIVE mg/dL
Hgb urine dipstick: NEGATIVE
Ketones, ur: 5 mg/dL — AB
Leukocytes, UA: NEGATIVE
Nitrite: NEGATIVE
Protein, ur: 30 mg/dL — AB
Specific Gravity, Urine: 1.02 (ref 1.005–1.030)
pH: 5 (ref 5.0–8.0)

## 2017-12-22 LAB — I-STAT CG4 LACTIC ACID, ED
Lactic Acid, Venous: 2.48 mmol/L (ref 0.5–1.9)
Lactic Acid, Venous: 1.46 mmol/L (ref 0.5–1.9)

## 2017-12-22 LAB — INFLUENZA PANEL BY PCR (TYPE A & B)
Influenza A By PCR: NEGATIVE
Influenza B By PCR: NEGATIVE

## 2017-12-22 MED ORDER — SODIUM CHLORIDE 0.9 % IV SOLN: 500.0000 mg | Freq: Once | INTRAVENOUS | Status: DC

## 2017-12-22 MED ORDER — GUAIFENESIN ER 600 MG PO TB12
600.0000 mg | ORAL_TABLET | Freq: Two times a day (BID) | ORAL | Status: DC
  Administered 2017-12-22 – 2017-12-27 (×10): 600 mg via ORAL
  Filled 2017-12-22 (×10): qty 1

## 2017-12-22 MED ORDER — LEVOTHYROXINE SODIUM 75 MCG PO TABS
75.0000 ug | ORAL_TABLET | Freq: Every day | ORAL | Status: DC
  Administered 2017-12-23 – 2017-12-27 (×5): 75 ug via ORAL
  Filled 2017-12-22 (×5): qty 1

## 2017-12-22 MED ORDER — SODIUM CHLORIDE 0.9 % IV SOLN: INTRAVENOUS | Status: DC

## 2017-12-22 MED ORDER — LEVOFLOXACIN IN D5W 750 MG/150ML IV SOLN: 750.0000 mg | Freq: Once | INTRAVENOUS | Status: DC

## 2017-12-22 MED ORDER — CEFTRIAXONE SODIUM 1 G IJ SOLR: 1.0000 g | Freq: Once | INTRAMUSCULAR | Status: DC

## 2017-12-22 MED ORDER — ALBUTEROL SULFATE (2.5 MG/3ML) 0.083% IN NEBU
5.0000 mg | INHALATION_SOLUTION | Freq: Once | RESPIRATORY_TRACT | Status: AC
  Administered 2017-12-22: 5 mg via RESPIRATORY_TRACT
  Filled 2017-12-22: qty 6

## 2017-12-22 MED ORDER — POLYVINYL ALCOHOL 1.4 % OP SOLN
1.0000 [drp] | Freq: Four times a day (QID) | OPHTHALMIC | Status: DC
  Administered 2017-12-22 – 2017-12-27 (×12): 1 [drp] via OPHTHALMIC
  Filled 2017-12-22: qty 15

## 2017-12-22 MED ORDER — HYDRALAZINE HCL 20 MG/ML IJ SOLN: 10.0000 mg | Freq: Three times a day (TID) | INTRAMUSCULAR | Status: DC | PRN

## 2017-12-22 MED ORDER — CARVEDILOL 25 MG PO TABS
25.0000 mg | ORAL_TABLET | Freq: Two times a day (BID) | ORAL | Status: DC
  Administered 2017-12-22 – 2017-12-27 (×10): 25 mg via ORAL
  Filled 2017-12-22 (×10): qty 1

## 2017-12-22 MED ORDER — ONDANSETRON HCL 4 MG/2ML IJ SOLN: 4.0000 mg | Freq: Three times a day (TID) | INTRAMUSCULAR | Status: DC | PRN

## 2017-12-22 MED ORDER — APIXABAN 5 MG PO TABS
5.0000 mg | ORAL_TABLET | Freq: Two times a day (BID) | ORAL | Status: DC
  Administered 2017-12-22 – 2017-12-27 (×10): 5 mg via ORAL
  Filled 2017-12-22 (×10): qty 1

## 2017-12-22 MED ORDER — TORSEMIDE 20 MG PO TABS
30.0000 mg | ORAL_TABLET | Freq: Every day | ORAL | Status: DC
  Administered 2017-12-23 – 2017-12-25 (×3): 30 mg via ORAL
  Filled 2017-12-22 (×4): qty 2

## 2017-12-22 MED ORDER — LEVOFLOXACIN IN D5W 750 MG/150ML IV SOLN
750.0000 mg | INTRAVENOUS | Status: DC
  Administered 2017-12-24: 750 mg via INTRAVENOUS
  Filled 2017-12-22: qty 150

## 2017-12-22 MED ORDER — LEVOFLOXACIN IN D5W 750 MG/150ML IV SOLN
750.0000 mg | Freq: Once | INTRAVENOUS | Status: AC
  Administered 2017-12-22: 750 mg via INTRAVENOUS
  Filled 2017-12-22: qty 150

## 2017-12-22 MED ORDER — IRBESARTAN 300 MG PO TABS
300.0000 mg | ORAL_TABLET | Freq: Every day | ORAL | Status: DC
  Administered 2017-12-22 – 2017-12-26 (×5): 300 mg via ORAL
  Filled 2017-12-22 (×5): qty 1

## 2017-12-22 MED ORDER — SODIUM CHLORIDE 0.9 % IV SOLN
INTRAVENOUS | Status: DC
Start: 2017-12-22 — End: 2017-12-23
  Administered 2017-12-22: 16:00:00 via INTRAVENOUS

## 2017-12-22 NOTE — ED Notes (Signed)
Patient transported to X-ray 

## 2017-12-22 NOTE — Progress Notes (Signed)
New Admission Note:   Arrival Method: ED stretcher  Mental Orientation: Alert and oriented x4 Telemetry: box 13 Assessment: Completed Skin: Intact IV: Left AC Pain: denies Tubes: None Safety Measures: Safety Fall Prevention Plan has been discussed  Admission: 12/22/17 5 [place] Orientation: Patient has been orientated to the room, unit and staff.   Family: at bedside  Orders to be reviewed and implemented. Will continue to monitor the patient. Call light has been placed within reach and bed alarm has been activated.   Baldo Ash, RN
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
## 2017-12-22 NOTE — ED Notes (Signed)
I stat lactic results reported to Dr. Wilson Singer by B. Yolanda Bonine, EMT

## 2017-12-22 NOTE — ED Triage Notes (Signed)
Pt arrives from home via [facility] c/o cough  " for past few days."  EMS reports pt satting at 88% on RA with retractions, placed on 2L [place], sats improved to 99%. EMS reports giving:  5mg  albuterol 94 mg solu-medrol 1000 mg tylenol  P AOx4 on arrival, productive cough noted.

## 2017-12-22 NOTE — H&P (Signed)
[facility] History and Physical  ZAVEN KLEMENS KDT:[id number] DOB: 17-Oct-1922 DOA: 12/22/2017  Referring physician:  PCP: Cassandria Anger, MD   Chief Complaint: "I kept coughing."  HPI: Ricky [name] is a 84 y.o. male with past medical history significant for anxiety, atrial fibrillation, hypothyroidism, tobacco abuse (former) presents to the emergency room with a chief complaint of cough and shortness of breath.  History gathered from patient and son.  Son to come to help father due to increasing weakness due to cough shortness of breath for the last 3-4 days.  Patient denies any fever or chills.  Patient's appetite is decreased during this time with lower p.o. intake.  Patient denies any recent medication changes.  Patient did not improve over time so he had assembled to the emergency room for evaluation.  No exposure to sick contacts.  No recent upper respiratory tract infection symptoms.  ED course: X-ray shows likely right-sided pneumonia.  Patient started on Levaquin.  Hospitalist consulted for admission.   Review of Systems:  As per HPI otherwise 10 point review of systems negative.    Past Medical History:  Diagnosis Date  . ANXIETY 04/26/2008  . Atrial fibrillation ([place]) 10/31/2010   Coumadin therapy;  Echo 6/12: EF 50-55%, moderate MR, moderate TR, PASP 52, mild LAE  . BPH (benign prostatic hypertrophy)   . Essential hypertension, benign 10/03/2007  . HTN (hypertension) 06/07/2013  . HYPOTHYROIDISM 09/04/2007  . LYMPHADENOPATHY 04/06/2009  . Nocturia   . Pacemaker    Status post AV nodal ablation  . TOBACCO USE, QUIT 07/14/2009   Past Surgical History:  Procedure Laterality Date  . COLONOSCOPY    . EYE SURGERY  2000   eye muscle release  . EYE SURGERY     both cataracts  . HERNIA REPAIR    . INGUINAL HERNIA REPAIR Right 05/06/2013   Procedure: HERNIA REPAIR INGUINAL ADULT;  Surgeon: Haywood Lasso, MD;  Location: [place];  Service:  General;  Laterality: Right;  . PACEMAKER INSERTION  10/16/05  . PPM GENERATOR CHANGEOUT N/A 10/31/2016   Procedure: PPM Generator Changeout;  Surgeon: Deboraha Sprang, MD;  Location: [facility];  Service: Cardiovascular;  Laterality: N/A;   Social History:  reports that he quit smoking about 38 years ago. His smoking use included cigarettes. He has a 30.00 pack-year smoking history. He has never used smokeless tobacco. He reports that he does not drink alcohol or use drugs.  Allergies  Allergen Reactions  . Bee Venom Anaphylaxis  . Amiodarone Hcl     Weakness in muscles   . Atorvastatin     unknown  . Benazepril Hcl     unknown  . Clindamycin/Lincomycin Other (See Comments)    Abdominal discomfort   . Clonidine Hydrochloride     unknown  . Colesevelam     unknown  . Diltiazem Hcl     unknown  . Ezetimibe     Unknown   . Verapamil Other (See Comments)    unknown  . Amoxicillin Rash    Has patient had a PCN reaction causing immediate rash, facial/tongue/throat swelling, SOB or lightheadedness with hypotension: Yes Has patient had a PCN reaction causing severe rash involving mucus membranes or skin necrosis: No Has patient had a PCN reaction that required hospitalization Unknow Has patient had a PCN reaction occurring within the last 10 years: Unknown If all of the above answers are "NO", then may proceed with Cephalosporin use.  Family History  Problem Relation Age of Onset  . Hypertension Mother   . Cancer Mother        uterine/cervical  . Heart disease Father   . Hypertension Other      Prior to Admission medications   Medication Sig Start Date End Date Taking? Authorizing Provider  apixaban (ELIQUIS) 5 MG TABS tablet Take 1 tablet (5 mg total) by mouth 2 (two) times daily. 12/30/13  Yes Plotnikov, Evie Lacks, MD  Ascorbic Acid (VITAMIN C PO) Take 1 tablet by mouth daily.   Yes [provider]  Carboxymethylcellul-Glycerin (LUBRICATING EYE DROPS OP)  Apply 1 drop to eye 5 (five) times daily.   Yes [provider]  carvedilol (COREG) 25 MG tablet Take 1 tablet (25 mg total) by mouth 2 (two) times daily. 12/15/13 12/22/17 Yes Deboraha Sprang, MD  Cholecalciferol 1000 UNITS tablet Take 1,000 Units by mouth daily. Reported on 12/23/2015   Yes [provider]  irbesartan (AVAPRO) 300 MG tablet TAKE ONE TABLET BY MOUTH AT BEDTIME 02/22/17  Yes Plotnikov, Evie Lacks, MD  levothyroxine (SYNTHROID, LEVOTHROID) 75 MCG tablet Take 75 mcg by mouth daily.   Yes [provider]  torsemide (DEMADEX) 10 MG tablet Take 3 tablets (30 mg) by mouth in the AM and 1 tablet (10 mg) by mouth in the PM 04/29/17  Yes Plotnikov, Evie Lacks, MD  diphenhydrAMINE (BENADRYL) 25 mg capsule Take 1-2 capsules (25-50 mg total) by mouth every 4 (four) hours as needed for itching or allergies (1-2 for bee stings). 12/23/14 05/31/17  Plotnikov, Evie Lacks, MD  EPINEPHrine (EPIPEN 2-PAK) 0.3 mg/0.3 mL DEVI Inject 0.3 mg into the muscle daily as needed (allergic reaction). For allergic reaction    [provider]   Physical Exam: Vitals:   12/22/17 1600 12/22/17 1630 12/22/17 1700 12/22/17 1753  BP: (!) 148/77 (!) 151/82 (!) 161/81 (!) 167/88  Pulse: 65 65 65 66  Resp: (!) 21 16 17 20   Temp:    97.8 F (36.6 C)  TempSrc:    Oral  SpO2: 97% 98% 97% 96%  Weight:    73.5 kg (162 lb)  Height:    5\' 6"  (1.676 m)    Wt Readings from Last 3 Encounters:  12/22/17 73.5 kg (162 lb)  12/16/17 73.5 kg (162 lb)  07/03/17 75.8 kg (167 lb)    General:  Appears calm and comfortable; A&Ox3 Eyes:  PERRL, EOMI, normal lids, iris ENT:  grossly normal hearing, lips & tongue Neck:  no LAD, masses or thyromegaly Cardiovascular:  RRR, no m/r/g. No LE edema.  Respiratory:  Rhochi, incr wob, cough Abdomen:  soft, ntnd Skin:  no rash or induration seen on limited exam Musculoskeletal:  grossly normal tone BUE/BLE Psychiatric:  grossly normal mood and affect, speech  fluent and appropriate Neurologic:  CN 2-12 grossly intact, moves all extremities in coordinated fashion.          Labs on Admission:  Basic Metabolic Panel: Recent Labs  Lab 12/16/17 1338 12/22/17 1412  NA 140 140  K 4.8 4.0  CL 100 101  CO2 33* 23  GLUCOSE 104* 154*  BUN 28* 31*  CREATININE 0.95 1.07  CALCIUM 9.4 8.7*   Liver Function Tests: No results for input(s): AST, ALT, ALKPHOS, BILITOT, PROT, ALBUMIN in the last 168 hours. No results for input(s): LIPASE, AMYLASE in the last 168 hours. No results for input(s): AMMONIA in the last 168 hours. CBC: Recent Labs  Lab 12/22/17 1412  WBC 7.5  NEUTROABS 6.3  HGB 13.4  HCT 41.5  MCV 103.5*  PLT 189   Cardiac Enzymes: No results for input(s): CKTOTAL, CKMB, CKMBINDEX, TROPONINI in the last 168 hours.  BNP (last 3 results) No results for input(s): BNP in the last 8760 hours.  ProBNP (last 3 results) No results for input(s): PROBNP in the last 8760 hours.   Serum creatinine: 1.07 mg/dL 12/22/17 1412 Estimated creatinine clearance: 39.8 mL/min  CBG: No results for input(s): GLUCAP in the last 168 hours.  Radiological Exams on Admission: Dg Chest 2 View  Result Date: 12/22/2017 CLINICAL DATA:  84 year old with 5 day history of cough and shortness of breath. Former smoker. EXAM: CHEST - 2 VIEW COMPARISON:  09/06/2015, 10/22/2012 and earlier. FINDINGS: AP ERECT and LATERAL images were obtained. Cardiac silhouette mildly enlarged, unchanged. RIGHT subclavian dual lead transvenous pacemaker unchanged and appears intact. Thoracic aorta mildly atherosclerotic, unchanged. Emphysematous changes throughout both lungs, unchanged. BILATERAL pleural effusions, unchanged. Airspace consolidation involving the RIGHT upper lobe. Lungs otherwise clear. Pulmonary vascularity normal. Degenerative changes involving the thoracic and UPPER lumbar spine with exaggeration of the usual thoracic kyphosis. IMPRESSION: 1. Acute RIGHT upper lobe  pneumonia. 2. Stable mild cardiomegaly without evidence of pulmonary edema. 3. Stable chronic BILATERAL pleural effusions. Electronically Signed   By: Evangeline Dakin M.D.   On: 12/22/2017 15:05    EKG: Independently reviewed. Ventricular paced rhythm.   Assessment/Plan Active Problems:   CAP (community acquired pneumonia)  CAP Scheduled DuoNeb's When necessary albuterol Antibiotic-levaquin Oxygen therapy Continuous pulse oximetry CXR in AM Mucinex BID IS/FLutter  Hypertension When necessary hydralazine 10 mg IV as needed for severe blood pressure  Afib Cont eliquis  CHF Cont avapro, coreg  Hypothyroidism Cont OP synthroid 75 mcg qd No signs of hyper or hypothyroidism   Code Status: FC DVT Prophylaxis: Eliquis Family Communication: called son, sister is POA no answer on call & couldn't leave VM Disposition Plan: Pending Improvement  Status: tele, inpt  Elwin Mocha, MD Family Medicine [facility] [email or web address] Password [email or web address]

## 2017-12-23 ENCOUNTER — Inpatient Hospital Stay (HOSPITAL_COMMUNITY): Payer: Medicare HMO

## 2017-12-23 DIAGNOSIS — R05 Cough: Secondary | ICD-10-CM

## 2017-12-23 LAB — BASIC METABOLIC PANEL
ANION GAP: 10 (ref 5–15)
BUN: 27 mg/dL — ABNORMAL HIGH (ref 6–20)
CHLORIDE: 103 mmol/L (ref 101–111)
CO2: 28 mmol/L (ref 22–32)
Calcium: 8.4 mg/dL — ABNORMAL LOW (ref 8.9–10.3)
Creatinine, Ser: 0.96 mg/dL (ref 0.61–1.24)
GFR calc non Af Amer: >60 mL/min (ref >60)
Glucose, Bld: 140 mg/dL — ABNORMAL HIGH (ref 65–99)
Potassium: 4.1 mmol/L (ref 3.5–5.1)
Sodium: 141 mmol/L (ref 135–145)

## 2017-12-23 LAB — CBC WITH DIFFERENTIAL/PLATELET
Basophils Absolute: 0 10*3/uL (ref 0.0–0.1)
Basophils Relative: 0 %
Eosinophils Absolute: 0 10*3/uL (ref 0.0–0.7)
Eosinophils Relative: 0 %
HEMATOCRIT: 39 % (ref 39.0–52.0)
HEMOGLOBIN: 12.4 g/dL — AB (ref 13.0–17.0)
LYMPHS ABS: 0.7 10*3/uL (ref 0.7–4.0)
Lymphocytes Relative: 12 %
MCH: 33.2 pg (ref 26.0–34.0)
MCHC: 31.8 g/dL (ref 30.0–36.0)
MCV: 104.3 fL — ABNORMAL HIGH (ref 78.0–100.0)
Monocytes Absolute: 0.4 10*3/uL (ref 0.1–1.0)
Monocytes Relative: 7 %
NEUTROS ABS: 5.2 10*3/uL (ref 1.7–7.7)
NEUTROS PCT: 81 %
Platelets: 185 10*3/uL (ref 150–400)
RBC: 3.74 MIL/uL — ABNORMAL LOW (ref 4.22–5.81)
RDW: 14 % (ref 11.5–15.5)
WBC: 6.3 10*3/uL (ref 4.0–10.5)

## 2017-12-23 LAB — HIV ANTIBODY (ROUTINE TESTING W REFLEX): HIV Screen 4th Generation wRfx: NONREACTIVE

## 2017-12-23 LAB — STREP PNEUMONIAE URINARY ANTIGEN: Strep Pneumo Urinary Antigen: NEGATIVE

## 2017-12-23 MED ORDER — ORAL CARE MOUTH RINSE
15.0000 mL | Freq: Two times a day (BID) | OROMUCOSAL | Status: DC
  Administered 2017-12-23 – 2017-12-25 (×3): 15 mL via OROMUCOSAL

## 2017-12-23 NOTE — Progress Notes (Signed)
PROGRESS NOTE    Ricky [name]  UKG:[id number] DOB: 17-Apr-1923 DOA: 12/22/2017 PCP: Cassandria Anger, MD   Outpatient Specialists:    Brief Narrative:  Ricky [name] is a 84 y.o. male with past medical history significant for anxiety, atrial fibrillation, hypothyroidism, tobacco abuse (former) presents to the emergency room with a chief complaint of cough and shortness of breath.  History gathered from patient and son.  Son to come to help father due to increasing weakness due to cough shortness of breath for the last 3-4 days.  Patient denies any fever or chills.  Patient's appetite is decreased during this time with lower p.o. intake.  Patient denies any recent medication changes.  Patient did not improve over time so he had assembled to the emergency room for evaluation.  No exposure to sick contacts.  No recent upper respiratory tract infection symptoms.  ED course: X-ray shows likely right-sided pneumonia.  Patient started on Levaquin.  Hospitalist consulted for admission.     Assessment & Plan:   Active Problems:   CAP (community acquired pneumonia)   CAP Antibiotic-levaquin (was given in the ER) Oxygen therapy-- wean as tolerated Mucinex BID IS/FLutter -repeat chest x ray this AM shows possible progressive PNA vs fluid-- will d/c IVF-- resume demadex -repeat in AM -PT evaluation (patient lives alone but may go home with daughter)  Hypertension When necessary hydralazine 10 mg IV as needed for severe blood pressure  Afib Cont eliquis  Hypothyroidism Cont OP synthroid 75 mcg qd No signs of hyper or hypothyroidism      DVT prophylaxis:  Fully anticoagulated   Code Status: Full Code   Family Communication: Offered to call family-- patient declined  Disposition Plan:     Consultants:       Subjective: Feeling some better  Objective: Vitals:   12/22/17 2219 12/23/17 0446 12/23/17 0500 12/23/17 0856  BP: 133/80 134/76  132/72    Pulse: 65 66  68  Resp: 16 14  16   Temp: (!) 97.4 F (36.3 C) 97.9 F (36.6 C)  98 F (36.7 C)  TempSrc: Oral Oral  Oral  SpO2: 99%   98%  Weight:   73.5 kg (162 lb 0.6 oz)   Height:        Intake/Output Summary (Last 24 hours) at 12/23/2017 1234 Last data filed at 12/23/2017 0900 Gross per 24 hour  Intake 360 ml  Output 500 ml  Net -140 ml   Filed Weights   12/22/17 1753 12/23/17 0500  Weight: 73.5 kg (162 lb) 73.5 kg (162 lb 0.6 oz)    Examination:  General exam: Appears calm and comfortable- on 2L [place]  Respiratory system: Coarse breath sounds Cardiovascular system: S1 & S2 heard, RRR. No JVD, murmurs, rubs, gallops or clicks. No pedal edema. Gastrointestinal system: Abdomen is nondistended, soft and nontender. No organomegaly or masses felt. Normal bowel sounds heard. Central nervous system: Alert and oriented. No focal neurological deficits. Extremities: Symmetric 5 x 5 power. Skin: No rashes, lesions or ulcers Psychiatry: Judgement and insight appear normal. Mood & affect appropriate.     Data Reviewed: I have personally reviewed following labs and imaging studies  CBC: Recent Labs  Lab 12/22/17 1412 12/23/17 0624  WBC 7.5 6.3  NEUTROABS 6.3 5.2  HGB 13.4 12.4*  HCT 41.5 39.0  MCV 103.5* 104.3*  PLT 189 762   Basic Metabolic Panel: Recent Labs  Lab 12/16/17 1338 12/22/17 1412 12/23/17 0624  NA 140 140 141  K  4.8 4.0 4.1  CL 100 101 103  CO2 33* 23 28  GLUCOSE 104* 154* 140*  BUN 28* 31* 27*  CREATININE 0.95 1.07 0.96  CALCIUM 9.4 8.7* 8.4*   GFR: Estimated Creatinine Clearance: 44.3 mL/min (by C-G formula based on SCr of 0.96 mg/dL). Liver Function Tests: No results for input(s): AST, ALT, ALKPHOS, BILITOT, PROT, ALBUMIN in the last 168 hours. No results for input(s): LIPASE, AMYLASE in the last 168 hours. No results for input(s): AMMONIA in the last 168 hours. Coagulation Profile: No results for input(s): INR, PROTIME in the last 168  hours. Cardiac Enzymes: No results for input(s): CKTOTAL, CKMB, CKMBINDEX, TROPONINI in the last 168 hours. BNP (last 3 results) No results for input(s): PROBNP in the last 8760 hours. HbA1C: No results for input(s): HGBA1C in the last 72 hours. CBG: No results for input(s): GLUCAP in the last 168 hours. Lipid Profile: No results for input(s): CHOL, HDL, LDLCALC, TRIG, CHOLHDL, LDLDIRECT in the last 72 hours. Thyroid Function Tests: No results for input(s): TSH, T4TOTAL, FREET4, T3FREE, THYROIDAB in the last 72 hours. Anemia Panel: No results for input(s): VITAMINB12, FOLATE, FERRITIN, TIBC, IRON, RETICCTPCT in the last 72 hours. Urine analysis:    Component Value Date/Time   COLORURINE YELLOW 12/22/2017 [place] 12/22/2017 1727   LABSPEC 1.020 12/22/2017 1727   PHURINE 5.0 12/22/2017 1727   GLUCOSEU NEGATIVE 12/22/2017 1727   GLUCOSEU NEGATIVE 12/28/2013 0845   HGBUR NEGATIVE 12/22/2017 1727   BILIRUBINUR NEGATIVE 12/22/2017 1727   KETONESUR 5 (A) 12/22/2017 1727   PROTEINUR 30 (A) 12/22/2017 1727   UROBILINOGEN 0.2 12/28/2013 0845   NITRITE NEGATIVE 12/22/2017 1727   LEUKOCYTESUR NEGATIVE 12/22/2017 1727     )No results found for this or any previous visit (from the past 240 hour(s)).    Anti-infectives (From admission, onward)   Start     Dose/Rate Route Frequency Ordered Stop   12/24/17 1000  levofloxacin (LEVAQUIN) IVPB 750 mg     750 mg 100 mL/hr over 90 Minutes Intravenous Every 48 hours 12/22/17 1839 12/28/17 0959   12/23/17 1200  levofloxacin (LEVAQUIN) IVPB 750 mg  Status:  Discontinued     750 mg 100 mL/hr over 90 Minutes Intravenous  Once 12/22/17 1552 12/22/17 1836   12/22/17 1515  cefTRIAXone (ROCEPHIN) 1 g in sodium chloride 0.9 % 100 mL IVPB  Status:  Discontinued     1 g 200 mL/hr over 30 Minutes Intravenous  Once 12/22/17 1509 12/22/17 1510   12/22/17 1515  azithromycin (ZITHROMAX) 500 mg in sodium chloride 0.9 % 250 mL IVPB  Status:   Discontinued     500 mg 250 mL/hr over 60 Minutes Intravenous  Once 12/22/17 1509 12/22/17 1510   12/22/17 1515  levofloxacin (LEVAQUIN) IVPB 750 mg     750 mg 100 mL/hr over 90 Minutes Intravenous  Once 12/22/17 1510 12/22/17 1711       Radiology Studies: Dg Chest 2 View  Result Date: 12/22/2017 CLINICAL DATA:  84 year old with 5 day history of cough and shortness of breath. Former smoker. EXAM: CHEST - 2 VIEW COMPARISON:  09/06/2015, 10/22/2012 and earlier. FINDINGS: AP ERECT and LATERAL images were obtained. Cardiac silhouette mildly enlarged, unchanged. RIGHT subclavian dual lead transvenous pacemaker unchanged and appears intact. Thoracic aorta mildly atherosclerotic, unchanged. Emphysematous changes throughout both lungs, unchanged. BILATERAL pleural effusions, unchanged. Airspace consolidation involving the RIGHT upper lobe. Lungs otherwise clear. Pulmonary vascularity normal. Degenerative changes involving the thoracic and UPPER lumbar  spine with exaggeration of the usual thoracic kyphosis. IMPRESSION: 1. Acute RIGHT upper lobe pneumonia. 2. Stable mild cardiomegaly without evidence of pulmonary edema. 3. Stable chronic BILATERAL pleural effusions. Electronically Signed   By: Evangeline Dakin M.D.   On: 12/22/2017 15:05   Dg Chest Port 1 View  Result Date: 12/23/2017 CLINICAL DATA:  84 year old male with cough.  Subsequent encounter. EXAM: PORTABLE CHEST 1 VIEW COMPARISON:  [phone] chest x-ray. FINDINGS: Progressive airspace disease throughout the right lung may represent spread of pneumonia. Asymmetric pulmonary edema secondary less likely consideration. Biapical pleural thickening unchanged. Sequential pacemaker in place.  Mild cardiomegaly. Calcified aorta. IMPRESSION: Progressive airspace disease throughout right lung may represent spread of pneumonia. Asymmetric pulmonary edema secondary less likely consideration. Sequential pacemaker in place.  Mild cardiomegaly. Aortic  Atherosclerosis (ICD10-I70.0). Electronically Signed   By: Genia Del M.D.   On: 12/23/2017 07:41        Scheduled Meds: . apixaban  5 mg Oral BID  . carvedilol  25 mg Oral BID  . guaiFENesin  600 mg Oral BID  . irbesartan  300 mg Oral QHS  . levothyroxine  75 mcg Oral QAC breakfast  . mouth rinse  15 mL Mouth Rinse BID  . polyvinyl alcohol  1 drop Both Eyes QID  . torsemide  30 mg Oral Daily   Continuous Infusions: . [START ON 12/24/2017] levofloxacin (LEVAQUIN) IV       LOS: 1 day    Time spent: 35 min    Geradine Girt, DO [facility] Pager [phone]  If 7PM-7AM, please contact night-coverage [email or web address] Password [facility] 12/23/2017, 12:34 PM

## 2017-12-24 ENCOUNTER — Inpatient Hospital Stay (HOSPITAL_COMMUNITY): Payer: Medicare HMO

## 2017-12-24 MED ORDER — DOXYCYCLINE HYCLATE 100 MG PO TABS
100.0000 mg | ORAL_TABLET | Freq: Two times a day (BID) | ORAL | Status: DC
  Administered 2017-12-24 – 2017-12-27 (×6): 100 mg via ORAL
  Filled 2017-12-24 (×6): qty 1

## 2017-12-24 MED ORDER — FUROSEMIDE 10 MG/ML IJ SOLN
20.0000 mg | Freq: Once | INTRAMUSCULAR | Status: AC
  Administered 2017-12-24: 20 mg via INTRAVENOUS
  Filled 2017-12-24: qty 2

## 2017-12-24 MED ORDER — POLYETHYLENE GLYCOL 3350 17 G PO PACK
17.0000 g | PACK | Freq: Every day | ORAL | Status: DC
  Administered 2017-12-24 – 2017-12-27 (×4): 17 g via ORAL
  Filled 2017-12-24 (×4): qty 1

## 2017-12-24 MED ORDER — DOCUSATE SODIUM 100 MG PO CAPS
100.0000 mg | ORAL_CAPSULE | Freq: Every day | ORAL | Status: DC
  Administered 2017-12-24 – 2017-12-27 (×4): 100 mg via ORAL
  Filled 2017-12-24 (×4): qty 1

## 2017-12-24 NOTE — Evaluation (Signed)
Physical Therapy Evaluation Patient Details Name: Ricky [name] MRN: [id number] DOB: March 06, 1923 Today's Date: 12/24/2017   History of Present Illness  Patient is a 84 y/o male with PMH of anxiety, atrial fibrillation on Eliquis, hypothyroidism, tobacco abuse (former), Hypothyroidism; admitted for CAP.  Clinical Impression  Patient presents with decreased independence with mobility due to deficits listed in PT problem list.  He was living independently and walked with a cane in community, but currently min A for in room ambulation with RW.  Feel he will benefit from skilled PT in the acute setting to allow return home with 24 hour assist and follow up HHPT.  Educated on importance of frequent OOB and up for meals in addition to use of flutter valve,    Follow Up Recommendations Home health PT;Supervision/Assistance - 24 hour(HH aide)    Equipment Recommendations  Rolling walker with 5" wheels    Recommendations for Other Services OT consult     Precautions / Restrictions Precautions Precautions: Fall Precaution Comments: watch sats      Mobility  Bed Mobility Overal bed mobility: Needs Assistance Bed Mobility: Supine to Sit;Sit to Supine     Supine to sit: Min guard;HOB elevated Sit to supine: Min assist;HOB elevated   General bed mobility comments: assist for safety coming up to sit, assist for legs/positioning with cues to supine  Transfers Overall transfer level: Needs assistance Equipment used: Rolling walker (2 wheeled) Transfers: Sit to/from Stand Sit to Stand: Min assist         General transfer comment: cues for hand placement, initially posterior bias, but improved with standing and gait  Ambulation/Gait Ambulation/Gait assistance: Min assist Ambulation Distance (Feet): 25 Feet Assistive device: Rolling walker (2 wheeled) Gait Pattern/deviations: Step-to pattern;Step-through pattern;Decreased weight shift to left;Wide base of support;Trunk flexed      General Gait Details: assist for balance, safety and cues for walker use, limited distance due to fatigue, some mild hypoxia SpO2 86% at lowest on 1L O2  Stairs            Wheelchair Mobility    Modified Rankin (Stroke Patients Only)       Balance Overall balance assessment: Needs assistance   Sitting balance-Leahy Scale: Good     Standing balance support: Bilateral upper extremity supported Standing balance-Leahy Scale: Poor Standing balance comment: UE support for balance                             Pertinent Vitals/Pain Pain Assessment: No/denies pain    Home Living Family/patient expects to be discharged to:: Private residence Living Arrangements: Alone Available Help at Discharge: Family Type of Home: House Home Access: Stairs to enter Entrance Stairs-Rails: Psychiatric nurse of Steps: 4 Home Layout: One level Home Equipment: Cane - single point      Prior Function Level of Independence: Independent with assistive device(s)         Comments: used cane outdoors     Hand Dominance   Dominant Hand: Right    Extremity/Trunk Assessment   Upper Extremity Assessment Upper Extremity Assessment: Generalized weakness    Lower Extremity Assessment Lower Extremity Assessment: Generalized weakness    Cervical / Trunk Assessment Cervical / Trunk Assessment: Kyphotic  Communication   Communication: No difficulties  Cognition Arousal/Alertness: Awake/alert Behavior During Therapy: WFL for tasks assessed/performed Overall Cognitive Status: Within Functional Limits for tasks assessed  General Comments General comments (skin integrity, edema, etc.): daughter in room and relates she plans to take pt to her house and she works, but her spouse will be there during the day    Exercises     Assessment/Plan    PT Assessment Patient needs continued PT services  PT Problem  List Decreased strength;Decreased mobility;Decreased activity tolerance;Cardiopulmonary status limiting activity;Decreased balance;Decreased knowledge of use of DME;Decreased knowledge of precautions       PT Treatment Interventions DME instruction;Therapeutic activities;Gait training;Patient/family education;Therapeutic exercise;Balance training;Functional mobility training;Stair training    PT Goals (Current goals can be found in the Care Plan section)  Acute Rehab PT Goals Patient Stated Goal: to return to independent PT Goal Formulation: With patient/family Time For Goal Achievement: 01/07/18 Potential to Achieve Goals: Good    Frequency Min 3X/week   Barriers to discharge        Co-evaluation               AM-PAC PT "6 Clicks" Daily Activity  Outcome Measure Difficulty turning over in bed (including adjusting bedclothes, sheets and blankets)?: A Little Difficulty moving from lying on back to sitting on the side of the bed? : A Lot Difficulty sitting down on and standing up from a chair with arms (e.g., wheelchair, bedside commode, etc,.)?: Unable Help needed moving to and from a bed to chair (including a wheelchair)?: A Little Help needed walking in hospital room?: A Little Help needed climbing 3-5 steps with a railing? : A Lot 6 Click Score: 14    End of Session Equipment Utilized During Treatment: Gait belt;Oxygen Activity Tolerance: Patient limited by fatigue Patient left: with call bell/phone within reach;in bed;with family/visitor present   PT Visit Diagnosis: Muscle weakness (generalized) (M62.81);Other abnormalities of gait and mobility (R26.89)    Time: 7673-4193 PT Time Calculation (min) (ACUTE ONLY): 30 min   Charges:   PT Evaluation $PT Eval Moderate Complexity: 1 Mod PT Treatments $Gait Training: 8-22 mins   PT G CodesMagda Kiel, [place] [phone] 12/24/2017   Reginia Naas 12/24/2017, 4:36 PM

## 2017-12-24 NOTE — Consult Note (Signed)
[facility] CM Primary Care Navigator  12/24/2017  Ricky [name] Jan 31, 1923 [id number]  Met with patient, son [name]) and daughter Festus Holts) at the bedside to identify possible discharge needs. Patient reportshaving"difficulty breathing, increased coughing, weakness and fever that resultedto this admission.  Patientendorses Dr. Lew Dawes with [employer] [facility] [place] care provider.   [facility] [address] [facility] [place] obtain medications without difficulty.   Patient reportsthathemanageshis own medicationsat homewith use of "pill box" system filled once a week.  Patient mentioned that his children (daughter and son) have been providing transportationto hisdoctors'appointments and recently been using "[employer]" transportation as well.  According to daughter, patient lives alone and independently taking care of himself prior to admission. His son and daughter will be providing assistance with his needs, once patient is discharged.  Anticipated discharge plan is home with home health services per therapy recommendation. Daughter mentioned that patient can stay at her house after discharge.  Patientand children voiced understanding to call primary care provider's office for a post discharge follow-up appointment within 1- 2 weeksor sooner if needs arise.Patient letter (with PCP's contact number) was provided as theirreminder.  Explained to patientand children regardingTHN CM services available for health management at home butindicatedthathe has been managing himself well, without pressing needs/ concerns for now.  Patienthowever, had opted and verbally agreed forEMMI Pneumoniacalls tofollow him up at home. Referralmade forEMMI Pneumonia Calls after discharge.  Patientwasencouraged to seekreferral to [facility] care managementfrom primary care provider  ifdeemed necessary andappropriatefor further services in thefuture.  [facility] care management information provided for future needs thathe may have.  Primary care provider's office is listed as providing transition of care (TOC) follow-up.   For additional questions please contact:  Edwena Felty A. Roque Schill, BSN, RN-BC [facility] PRIMARY CARE Navigator Cell: [phone]

## 2017-12-24 NOTE — Progress Notes (Signed)
PROGRESS NOTE    Ricky [name]  ZHG:[id number] DOB: 10-21-1922 DOA: 12/22/2017 PCP: Cassandria Anger, MD   Outpatient Specialists:    Brief Narrative:  Ricky [name] is a 84 y.o. male with past medical history significant for anxiety, atrial fibrillation, hypothyroidism, tobacco abuse (former) presents to the emergency room with a chief complaint of cough and shortness of breath.  History gathered from patient and son.  Son to come to help father due to increasing weakness due to cough shortness of breath for the last 3-4 days.  Patient denies any fever or chills.  Patient's appetite is decreased during this time with lower p.o. intake.  Patient denies any recent medication changes.  Patient did not improve over time so he had assembled to the emergency room for evaluation.  No exposure to sick contacts.  No recent upper respiratory tract infection symptoms.  ED course: X-ray shows likely right-sided pneumonia.  Patient started on Levaquin.  Hospitalist consulted for admission.     Assessment & Plan:   Active Problems:   CAP (community acquired pneumonia)   CAP Antibiotic-levaquin (was given in the ER)- change to doxycyline Oxygen therapy-- wean as tolerated Mucinex BID IS/FLutter  Volume overload -last echo did not show a low EF -due to IVF given in ER -dose of IV lasix and will monitor for improvement  delirium -change levaquin to doxy -PT -OOB for meals -day/night hygiene -suspect will improve when d/c;d -check B12  Chronic Afib Cont eliquis -rate controlled  Hypothyroidism Cont OP synthroid 75 mcg qd No signs of hyper or hypothyroidism      DVT prophylaxis:  Fully anticoagulated   Code Status: Full Code   Family Communication: Patient allowed me to call daughter  Disposition Plan:     Consultants:    PT   Subjective: Coughing more this AM  Objective: Vitals:   12/23/17 1600 12/23/17 2036 12/24/17 0409 12/24/17 0921    BP: 128/75 (!) 145/82 (!) 156/92 (!) 174/91  Pulse: 66 65 64 62  Resp: 16   16  Temp: 98.2 F (36.8 C) 97.9 F (36.6 C) 98.4 F (36.9 C) 97.7 F (36.5 C)  TempSrc: Oral Oral Oral Oral  SpO2: 94% 91% 91% 92%  Weight:  67.6 kg (149 lb)    Height:        Intake/Output Summary (Last 24 hours) at 12/24/2017 1609 Last data filed at 12/24/2017 1411 Gross per 24 hour  Intake 960 ml  Output 1400 ml  Net -440 ml   Filed Weights   12/22/17 1753 12/23/17 0500 12/23/17 2036  Weight: 73.5 kg (162 lb) 73.5 kg (162 lb 0.6 oz) 67.6 kg (149 lb)    Examination:  General exam: in bed, more confused today Respiratory system: coarse breath sounds Cardiovascular system: rrr. Gastrointestinal system: +Bs, soft Central nervous system: awake but more confused than yesterday Extremities: moves all 4 ext Skin: No rashes, lesions or ulcers Psychiatry: irritable    Data Reviewed: I have personally reviewed following labs and imaging studies  CBC: Recent Labs  Lab 12/22/17 1412 12/23/17 0624  WBC 7.5 6.3  NEUTROABS 6.3 5.2  HGB 13.4 12.4*  HCT 41.5 39.0  MCV 103.5* 104.3*  PLT 189 196   Basic Metabolic Panel: Recent Labs  Lab 12/22/17 1412 12/23/17 0624  NA 140 141  K 4.0 4.1  CL 101 103  CO2 23 28  GLUCOSE 154* 140*  BUN 31* 27*  CREATININE 1.07 0.96  CALCIUM 8.7* 8.4*  GFR: Estimated Creatinine Clearance: 44.3 mL/min (by C-G formula based on SCr of 0.96 mg/dL). Liver Function Tests: No results for input(s): AST, ALT, ALKPHOS, BILITOT, PROT, ALBUMIN in the last 168 hours. No results for input(s): LIPASE, AMYLASE in the last 168 hours. No results for input(s): AMMONIA in the last 168 hours. Coagulation Profile: No results for input(s): INR, PROTIME in the last 168 hours. Cardiac Enzymes: No results for input(s): CKTOTAL, CKMB, CKMBINDEX, TROPONINI in the last 168 hours. BNP (last 3 results) No results for input(s): PROBNP in the last 8760 hours. HbA1C: No results for  input(s): HGBA1C in the last 72 hours. CBG: No results for input(s): GLUCAP in the last 168 hours. Lipid Profile: No results for input(s): CHOL, HDL, LDLCALC, TRIG, CHOLHDL, LDLDIRECT in the last 72 hours. Thyroid Function Tests: No results for input(s): TSH, T4TOTAL, FREET4, T3FREE, THYROIDAB in the last 72 hours. Anemia Panel: No results for input(s): VITAMINB12, FOLATE, FERRITIN, TIBC, IRON, RETICCTPCT in the last 72 hours. Urine analysis:    Component Value Date/Time   COLORURINE YELLOW 12/22/2017 [place] 12/22/2017 1727   LABSPEC 1.020 12/22/2017 1727   PHURINE 5.0 12/22/2017 1727   GLUCOSEU NEGATIVE 12/22/2017 1727   GLUCOSEU NEGATIVE 12/28/2013 0845   HGBUR NEGATIVE 12/22/2017 1727   BILIRUBINUR NEGATIVE 12/22/2017 1727   KETONESUR 5 (A) 12/22/2017 1727   PROTEINUR 30 (A) 12/22/2017 1727   UROBILINOGEN 0.2 12/28/2013 0845   NITRITE NEGATIVE 12/22/2017 1727   LEUKOCYTESUR NEGATIVE 12/22/2017 1727     ) Recent Results (from the past 240 hour(s))  Blood culture (routine x 2)     Status: None (Preliminary result)   Collection Time: 12/22/17  3:23 PM  Result Value Ref Range Status   Specimen Description BLOOD RIGHT FOREARM  Final   Special Requests   Final    BOTTLES DRAWN AEROBIC AND ANAEROBIC Blood Culture adequate volume   Culture   Final    NO GROWTH 2 DAYS Performed at [facility], [place] [address], [place], [place] [address]    Report Status PENDING  Incomplete  Blood culture (routine x 2)     Status: None (Preliminary result)   Collection Time: 12/22/17  3:30 PM  Result Value Ref Range Status   Specimen Description BLOOD LEFT WRIST  Final   Special Requests   Final    BOTTLES DRAWN AEROBIC ONLY Blood Culture results may not be optimal due to an inadequate volume of blood received in culture bottles   Culture   Final    NO GROWTH 2 DAYS Performed at [facility], [place] [address] [place],  [address]    Report Status PENDING   Incomplete      Anti-infectives (From admission, onward)   Start     Dose/Rate Route Frequency Ordered Stop   12/24/17 1000  levofloxacin (LEVAQUIN) IVPB 750 mg     750 mg 100 mL/hr over 90 Minutes Intravenous Every 48 hours 12/22/17 1839 12/28/17 0959   12/23/17 1200  levofloxacin (LEVAQUIN) IVPB 750 mg  Status:  Discontinued     750 mg 100 mL/hr over 90 Minutes Intravenous  Once 12/22/17 1552 12/22/17 1836   12/22/17 1515  cefTRIAXone (ROCEPHIN) 1 g in sodium chloride 0.9 % 100 mL IVPB  Status:  Discontinued     1 g 200 mL/hr over 30 Minutes Intravenous  Once 12/22/17 1509 12/22/17 1510   12/22/17 1515  azithromycin (ZITHROMAX) 500 mg in sodium chloride 0.9 % 250  mL IVPB  Status:  Discontinued     500 mg 250 mL/hr over 60 Minutes Intravenous  Once 12/22/17 1509 12/22/17 1510   12/22/17 1515  levofloxacin (LEVAQUIN) IVPB 750 mg     750 mg 100 mL/hr over 90 Minutes Intravenous  Once 12/22/17 1510 12/22/17 1711       Radiology Studies: Dg Chest 2 View  Result Date: 12/24/2017 CLINICAL DATA:  Fever and shortness of breath EXAM: CHEST - 2 VIEW COMPARISON:  December 23, 2017 FINDINGS: Airspace consolidation throughout much the right lung remains. There is new patchy airspace consolidation throughout much of the left upper lobe. Heart size and pulmonary vascularity are normal. Pacemaker leads are attached the right atrium and right ventricle. There is aortic atherosclerosis. There is degenerative change in thoracic spine. No adenopathy appreciable by radiography. IMPRESSION: Multifocal pneumonia with extensive persistent opacification on the right with new patchy airspace opacity in the left upper lobe. Stable cardiac silhouette. There is aortic atherosclerosis. Aortic Atherosclerosis (ICD10-I70.0). Electronically Signed   By: Lowella Grip III M.D.   On: 12/24/2017 08:59   Dg Chest Port 1 View  Result Date: 12/23/2017 CLINICAL DATA:  84 year old male with cough.  Subsequent encounter.  EXAM: PORTABLE CHEST 1 VIEW COMPARISON:  [phone] chest x-ray. FINDINGS: Progressive airspace disease throughout the right lung may represent spread of pneumonia. Asymmetric pulmonary edema secondary less likely consideration. Biapical pleural thickening unchanged. Sequential pacemaker in place.  Mild cardiomegaly. Calcified aorta. IMPRESSION: Progressive airspace disease throughout right lung may represent spread of pneumonia. Asymmetric pulmonary edema secondary less likely consideration. Sequential pacemaker in place.  Mild cardiomegaly. Aortic Atherosclerosis (ICD10-I70.0). Electronically Signed   By: Genia Del M.D.   On: 12/23/2017 07:41        Scheduled Meds: . apixaban  5 mg Oral BID  . carvedilol  25 mg Oral BID  . docusate sodium  100 mg Oral Daily  . guaiFENesin  600 mg Oral BID  . irbesartan  300 mg Oral QHS  . levothyroxine  75 mcg Oral QAC breakfast  . mouth rinse  15 mL Mouth Rinse BID  . polyethylene glycol  17 g Oral Daily  . polyvinyl alcohol  1 drop Both Eyes QID  . torsemide  30 mg Oral Daily   Continuous Infusions: . levofloxacin (LEVAQUIN) IV Stopped (12/24/17 1234)     LOS: 2 days    Time spent: 35 min    Geradine Girt, DO [facility] Pager [phone]  If 7PM-7AM, please contact night-coverage [email or web address] Password [email or web address] 12/24/2017, 4:09 PM

## 2017-12-24 NOTE — Care Management Note (Addendum)
Case Management Note  Patient Details  Name: DOMENIC SCHOENBERGER MRN: [id number] Date of Birth: [birth year]  Subjective/Objective:   History of anxiety, atrial fibrillation on Eliquis, hypothyroidism, tobacco abuse (former), Hypothyroidism; admitted for CAP (Community acquired pneumonia).       Action/Plan: Prior to admission patient lived at home alone.  PCP noted.  NCM will continue to monitor for discharge needs.  Expected Discharge Date:   To Be determined               Expected Discharge Plan:   To Be determined  Discharge planning Services  CM Consult  Post Acute Care Choice:    Choice offered to:     DME Arranged:    DME Agency:     HH Arranged:    HH Agency:     Status of Service:  In process, will continue to follow  If discussed at Long Length of Stay Meetings, dates discussed:    Additional Comments:  Kristen Cardinal, RN  Nurse case [place] 12/24/2017, 2:54 PM

## 2017-12-25 ENCOUNTER — Inpatient Hospital Stay (HOSPITAL_COMMUNITY): Payer: Medicare HMO

## 2017-12-25 DIAGNOSIS — R0600 Dyspnea, unspecified: Secondary | ICD-10-CM

## 2017-12-25 LAB — BASIC METABOLIC PANEL
Anion gap: 12 (ref 5–15)
BUN: 30 mg/dL — AB (ref 6–20)
CHLORIDE: 102 mmol/L (ref 101–111)
CO2: 29 mmol/L (ref 22–32)
CREATININE: 0.97 mg/dL (ref 0.61–1.24)
Calcium: 8.4 mg/dL — ABNORMAL LOW (ref 8.9–10.3)
GFR calc Af Amer: >60 mL/min (ref >60)
GFR calc non Af Amer: >60 mL/min (ref >60)
Glucose, Bld: 101 mg/dL — ABNORMAL HIGH (ref 65–99)
Potassium: 3.6 mmol/L (ref 3.5–5.1)
SODIUM: 143 mmol/L (ref 135–145)

## 2017-12-25 LAB — CBC
HCT: 39.2 % (ref 39.0–52.0)
Hemoglobin: 12.6 g/dL — ABNORMAL LOW (ref 13.0–17.0)
MCH: 33.6 pg (ref 26.0–34.0)
MCHC: 32.1 g/dL (ref 30.0–36.0)
MCV: 104.5 fL — ABNORMAL HIGH (ref 78.0–100.0)
Platelets: 220 10*3/uL (ref 150–400)
RBC: 3.75 MIL/uL — ABNORMAL LOW (ref 4.22–5.81)
RDW: 13.8 % (ref 11.5–15.5)
WBC: 9.3 10*3/uL (ref 4.0–10.5)

## 2017-12-25 LAB — VITAMIN B12: VITAMIN B 12: 2289 pg/mL — AB (ref 180–914)

## 2017-12-25 MED ORDER — TORSEMIDE 20 MG PO TABS
30.0000 mg | ORAL_TABLET | Freq: Every day | ORAL | Status: DC
  Administered 2017-12-26 – 2017-12-27 (×2): 30 mg via ORAL
  Filled 2017-12-25 (×2): qty 2

## 2017-12-25 MED ORDER — FUROSEMIDE 10 MG/ML IJ SOLN
20.0000 mg | Freq: Once | INTRAMUSCULAR | Status: AC
  Administered 2017-12-25: 20 mg via INTRAVENOUS
  Filled 2017-12-25: qty 2

## 2017-12-25 MED ORDER — TORSEMIDE 20 MG PO TABS
10.0000 mg | ORAL_TABLET | Freq: Every evening | ORAL | Status: DC
  Administered 2017-12-25 – 2017-12-26 (×2): 10 mg via ORAL
  Filled 2017-12-25 (×2): qty 1

## 2017-12-25 NOTE — Evaluation (Signed)
Occupational Therapy Evaluation Patient Details Name: Ricky [name] MRN: [id number] DOB: [birth year] Today's Date: 12/25/2017    History of Present Illness Pt is a 84 y.o. male admitted 12/22/17 with CAP. PMH includes anxiety, atrial fibrillation on Eliquis, hypothyroidism, tobacco abuse (former), pacemaker.   Clinical Impression   Pt reports he was independent with ADL PTA. Currently pt overall min guard assist for functional mobility with min assist to correct LOB x2 when turning in hallway and min assist for LB ADL due to balance deficits. Pt planning to d/c to his daughters house initially where he will have 24/7 supervision. Recommending HHOT for follow up to maximize independence and safety with ADL and functional mobility and to facilitate safe return home alone. Pt would benefit from continued skilled OT to address established goals.    Follow Up Recommendations  Home health OT;Supervision/Assistance - 24 hour    Equipment Recommendations  3 in 1 bedside commode    Recommendations for Other Services       Precautions / Restrictions Precautions Precautions: Fall Restrictions Weight Bearing Restrictions: No      Mobility Bed Mobility Overal bed mobility: Needs Assistance Bed Mobility: Sit to Supine       Sit to supine: Supervision   General bed mobility comments: Increased time but no physical assist required  Transfers Overall transfer level: Needs assistance Equipment used: Rolling walker (2 wheeled) Transfers: Sit to/from Stand Sit to Stand: Min guard         General transfer comment: for safety, mild unsteadiness initially but no LOB    Balance Overall balance assessment: Needs assistance Sitting-balance support: Feet supported;No upper extremity supported Sitting balance-Leahy Scale: Good     Standing balance support: Bilateral upper extremity supported Standing balance-Leahy Scale: Poor                             ADL either  performed or assessed with clinical judgement   ADL Overall ADL's : Needs assistance/impaired Eating/Feeding: Set up;Sitting   Grooming: Set up;Supervision/safety;Sitting   Upper Body Bathing: Set up;Supervision/ safety;Sitting   Lower Body Bathing: Minimal assistance;Sit to/from stand   Upper Body Dressing : Set up;Supervision/safety;Sitting   Lower Body Dressing: Minimal assistance;Sit to/from stand   Toilet Transfer: Minimal assistance;Ambulation;BSC;RW Toilet Transfer Details (indicate cue type and reason): Simulated by sit to stand from chair with functional mobility         Functional mobility during ADLs: Min guard;Rolling walker(min assist for LOB x2 with turning in hallway)       Vision         Perception     Praxis      Pertinent Vitals/Pain Pain Assessment: No/denies pain     Hand Dominance Right   Extremity/Trunk Assessment Upper Extremity Assessment Upper Extremity Assessment: Generalized weakness   Lower Extremity Assessment Lower Extremity Assessment: Defer to PT evaluation   Cervical / Trunk Assessment Cervical / Trunk Assessment: Kyphotic   Communication Communication Communication: No difficulties   Cognition Arousal/Alertness: Awake/alert Behavior During Therapy: WFL for tasks assessed/performed Overall Cognitive Status: No family/caregiver present to determine baseline cognitive functioning                                     General Comments       Exercises     Shoulder Instructions      Home Living Family/patient expects  to be discharged to:: Private residence Living Arrangements: Alone Available Help at Discharge: Family;Available 24 hours/day(planning to stay at daughter house) Type of Home: House Home Access: Stairs to enter [employer] of Steps: 4 Entrance Stairs-Rails: Right;Left Home Layout: Two level;Able to live on main level with bedroom/bathroom     Bathroom Shower/Tub: Arts administrator: Standard     Home Equipment: Cane - single point          Prior Functioning/Environment Level of Independence: Independent with assistive device(s)        Comments: used cane outdoors        OT Problem List: Decreased strength;Decreased activity tolerance;Impaired balance (sitting and/or standing);Decreased cognition;Decreased safety awareness;Decreased knowledge of use of DME or AE;Decreased knowledge of precautions      OT Treatment/Interventions: Self-care/ADL training;Therapeutic exercise;Energy conservation;DME and/or AE instruction;Therapeutic activities;Patient/family education;Balance training    OT Goals(Current goals can be found in the care plan section) Acute Rehab OT Goals Patient Stated Goal: to return to independent OT Goal Formulation: With patient Time For Goal Achievement: 01/08/18 Potential to Achieve Goals: Good ADL Goals Pt Will Perform Lower Body Bathing: with supervision;sit to/from stand Pt Will Perform Lower Body Dressing: with supervision;sit to/from stand Pt Will Perform Tub/Shower Transfer: Tub transfer;with supervision;ambulating;3 in 1;rolling walker Pt/caregiver will Perform Home Exercise Program: Increased strength;Both right and left upper extremity;With theraband;Independently;With written HEP provided Additional ADL Goal #1: Pt will independently verbally recall 3 energy conservation strategies and utilize during ADL.  OT Frequency: Min 2X/week   Barriers to D/C:            Co-evaluation              AM-PAC PT "6 Clicks" Daily Activity     Outcome Measure Help from another person eating meals?: None Help from another person taking care of personal grooming?: A Little Help from another person toileting, which includes using toliet, bedpan, or urinal?: A Little Help from another person bathing (including washing, rinsing, drying)?: A Little Help from another person to put on and taking off regular upper  body clothing?: None Help from another person to put on and taking off regular lower body clothing?: A Little 6 Click Score: 20   End of Session Equipment Utilized During Treatment: Rolling walker  Activity Tolerance: Patient tolerated treatment well Patient left: in bed;with call bell/phone within reach;with bed alarm set  OT Visit Diagnosis: Unsteadiness on feet (R26.81);Muscle weakness (generalized) (M62.81)                Time: 2979-8921 OT Time Calculation (min): 15 min Charges:  OT General Charges $OT Visit: 1 Visit OT Evaluation $OT Eval Moderate Complexity: 1 Mod G-Codes:     Loukas Antonson A. Ulice Brilliant, M.S., OTR/L Pager: [place] 12/25/2017, 2:25 PM

## 2017-12-25 NOTE — Progress Notes (Addendum)
NCM called son "Remo Lipps" at [phone], advised Physical Therapy is recommending home health physical therapy/24 hour care for the first 2 days (high fall risk).  Stated "I think my dad is going home with my sister/husband at: [address], [place], [place] [address]:  Husband is home all the time.  "Festus Holts" phone number: [phone].  Call placed Daughter Festus Holts, discussed recommendations of Physical therapy: [facility] PT/nurse aide, she agreed to recommendation but would like to review Meta list.  NCM advised will leave list in patient room for her to review. Festus Holts verbalized understanding.  Festus Holts states "coming back around 4 pm today to see patient".    Return call to [facility] at [phone], advised unfortunately [facility] will not be able to cover RN orders.  Offered choice and she selected [facility]. NCM advised will call [facility] alternative and see if they   Called referral for Home Health to [facility] with [facility] after verification of insurance, unfortunately do not accept patient's insurance  Plan. NCM called [facility] at [phone] per representative they do not coordinate for state of [place].  Spoke with Jillian at [phone], states I have to speak with Part B of the plan, transferred advised need INN providers for patient plans for [facility] services and was given the followingVanessa Kick [facility]  NCM called [facility]: states they are'nt able to service area due to staffing.      NCM called [phone], faxed clinical notes to [facility].  In to speak with patient, daughter Festus Holts at bedside.  Discussed the referral accepted with [facility] for PT/OT/RN/nurse aide.   Festus Holts agreed to services.    Montel Culver, BSN, RN Nurse Case [facility]

## 2017-12-25 NOTE — Progress Notes (Signed)
Physical Therapy Treatment Patient Details Name: Ricky [name] MRN: [id number] DOB: 1923/03/25 Today's Date: 12/25/2017    History of Present Illness Pt is a 84 y.o. male admitted 12/22/17 with CAP. PMH includes anxiety, atrial fibrillation on Eliquis, hypothyroidism, tobacco abuse (former), pacemaker.   PT Comments    Pt slowly progressing with mobility. Able to transfers and amb 50' with RW and min guard for balance; increased time to safely navigate RW around objects, but no physical assist required. Initiated stair training with single HHA, as pt believes pt's daughter has 3 steps to enter with no rail. Pt with slowed processing and at increased risk for falls. Recommend initial 24/7 supervision from family and [place] services upon return home. Will follow acutely.   Follow Up Recommendations  Home health PT;Supervision/Assistance - 24 hour(HH aide?)     Equipment Recommendations  Rolling walker with 5" wheels;3in1 (PT)    Recommendations for Other Services OT consult     Precautions / Restrictions Precautions Precautions: Fall Restrictions Weight Bearing Restrictions: No    Mobility  Bed Mobility Overal bed mobility: Needs Assistance Bed Mobility: Supine to Sit     Supine to sit: Supervision        Transfers Overall transfer level: Needs assistance Equipment used: Rolling walker (2 wheeled) Transfers: Sit to/from Stand Sit to Stand: Min guard         General transfer comment: Stood from bed and BSC (over toilet) with RW and min guard for balance; posterior bias, but pt able to correct with increased time. No physical assist required  Ambulation/Gait Ambulation/Gait assistance: Min guard Ambulation Distance (Feet): 50 Feet Assistive device: Rolling walker (2 wheeled) Gait Pattern/deviations: Step-through pattern;Decreased stride length;Trunk flexed Gait velocity: Decreased Gait velocity interpretation: Below normal speed for age/gender General Gait Details:  Amb with RW and min guard for balance. Increased time and effort to navigate RW safely and clear it from objects on floor, but able to do so without assist. SpO2 92% on RA   Stairs Stairs: Yes   Stair Management: Forwards(single HHA) Number of Stairs: 4 General stair comments: Simulated ascending stairs by high marching in place. Pt able to do so with single HHA to simulate ascending 3 steps in daughter's home with no rail  Wheelchair Mobility    Modified Rankin (Stroke Patients Only)       Balance Overall balance assessment: Needs assistance   Sitting balance-Leahy Scale: Good Sitting balance - Comments: Indep for pericare sitting on toilet   Standing balance support: Bilateral upper extremity supported Standing balance-Leahy Scale: Poor Standing balance comment: UE support for balance                            Cognition Arousal/Alertness: Awake/alert Behavior During Therapy: WFL for tasks assessed/performed Overall Cognitive Status: No family/caregiver present to determine baseline cognitive functioning Area of Impairment: Problem solving                             Problem Solving: Slow processing        Exercises      General Comments        Pertinent Vitals/Pain Pain Assessment: No/denies pain    Home Living                      Prior Function            PT Goals (  current goals can now be found in the care plan section) Acute Rehab PT Goals Patient Stated Goal: to return to independent PT Goal Formulation: With patient/family Time For Goal Achievement: 01/07/18 Potential to Achieve Goals: Fair Progress towards PT goals: Progressing toward goals    Frequency    Min 3X/week      PT Plan Current plan remains appropriate    Co-evaluation              AM-PAC PT "6 Clicks" Daily Activity  Outcome Measure  Difficulty turning over in bed (including adjusting bedclothes, sheets and blankets)?: A  Little Difficulty moving from lying on back to sitting on the side of the bed? : A Little Difficulty sitting down on and standing up from a chair with arms (e.g., wheelchair, bedside commode, etc,.)?: A Little Help needed moving to and from a bed to chair (including a wheelchair)?: A Little Help needed walking in hospital room?: A Little Help needed climbing 3-5 steps with a railing? : A Lot 6 Click Score: 17    End of Session Equipment Utilized During Treatment: Gait belt Activity Tolerance: Patient tolerated treatment well;Patient limited by fatigue Patient left: in chair;with call bell/phone within reach;with chair alarm set Nurse Communication: Mobility status PT Visit Diagnosis: Muscle weakness (generalized) (M62.81);Other abnormalities of gait and mobility (R26.89)     Time: 6226-3335 PT Time Calculation (min) (ACUTE ONLY): 31 min  Charges:  $Therapeutic Activity: 23-37 mins                    G Codes:      Mabeline Caras, PT, DPT Acute Rehab Services  Pager: [place] 12/25/2017, 10:31 AM

## 2017-12-26 DIAGNOSIS — R0602 Shortness of breath: Secondary | ICD-10-CM

## 2017-12-26 DIAGNOSIS — E876 Hypokalemia: Secondary | ICD-10-CM

## 2017-12-26 LAB — CBC
HCT: 39.7 % (ref 39.0–52.0)
HEMOGLOBIN: 12.7 g/dL — AB (ref 13.0–17.0)
MCH: 33.7 pg (ref 26.0–34.0)
MCHC: 32 g/dL (ref 30.0–36.0)
MCV: 105.3 fL — ABNORMAL HIGH (ref 78.0–100.0)
Platelets: 226 10*3/uL (ref 150–400)
RBC: 3.77 MIL/uL — AB (ref 4.22–5.81)
RDW: 14.1 % (ref 11.5–15.5)
WBC: 7.7 10*3/uL (ref 4.0–10.5)

## 2017-12-26 LAB — BASIC METABOLIC PANEL
Anion gap: 9 (ref 5–15)
BUN: 22 mg/dL — ABNORMAL HIGH (ref 6–20)
CO2: 33 mmol/L — ABNORMAL HIGH (ref 22–32)
CREATININE: 0.86 mg/dL (ref 0.61–1.24)
Calcium: 8.1 mg/dL — ABNORMAL LOW (ref 8.9–10.3)
Chloride: 101 mmol/L (ref 101–111)
Glucose, Bld: 97 mg/dL (ref 65–99)
POTASSIUM: 2.8 mmol/L — AB (ref 3.5–5.1)
SODIUM: 143 mmol/L (ref 135–145)

## 2017-12-26 MED ORDER — POTASSIUM CHLORIDE CRYS ER 20 MEQ PO TBCR
40.0000 meq | EXTENDED_RELEASE_TABLET | Freq: Once | ORAL | Status: AC
  Administered 2017-12-26: 40 meq via ORAL
  Filled 2017-12-26: qty 2

## 2017-12-26 NOTE — Progress Notes (Signed)
PROGRESS NOTE    COTTRELL GENTLES  WYO:[id number] DOB: [birth year] DOA: 12/22/2017 PCP: Cassandria Anger, MD   Outpatient Specialists:    Brief Narrative:  Ricky [name] is a 84 y.o. male with past medical history significant for anxiety, atrial fibrillation, hypothyroidism, tobacco abuse (former) presents to the emergency room with a chief complaint of cough and shortness of breath.  History gathered from patient and son.  Son to come to help father due to increasing weakness due to cough shortness of breath for the last 3-4 days. Found to have PNa, stay complicated by volume overload and slow to recover.    Assessment & Plan:   Active Problems:   CAP (community acquired pneumonia)   Dyspnea   CAP Antibiotic-levaquin (was given in the ER)- change to doxycyline due to mental status changes Oxygen therapy-- wean as tolerated Mucinex BID IS/FLutter -pulmonary toilet  hypokalemia -from lasix -replace and recheck with Mg   Volume overload -last echo did not show a low EF -due to IVF given in ER -dose of IV lasix x 2 and will monitor for improvement on home demadex  delirium -change levaquin to doxy -PT -OOB for meals -day/night hygiene -suspect will improve when d/c;d -B12 elevated  Chronic Afib Cont eliquis -rate controlled  Hypothyroidism Cont OP synthroid 75 mcg qd No signs of hyper or hypothyroidism      DVT prophylaxis:  Fully anticoagulated   Code Status: Full Code   Family Communication: Daughter at bedside (she would like to be updated-- plan is for him to go home with her when ready)  Disposition Plan:  Home 24-48 hours   Consultants:    PT   Subjective: Slept well, not able to cough up mucous  Objective: Vitals:   12/25/17 1532 12/25/17 2118 12/26/17 0551 12/26/17 0946  BP: (!) 168/89 (!) 164/86 (!) 176/92 (!) 126/52  Pulse: 64 66 64 65  Resp: 18 19 19 18   Temp: (!) 97.5 F (36.4 C) 98.1 F (36.7 C)    TempSrc:  Oral Oral    SpO2: 98% (!) 88% 100% (!) 76%  Weight:  67.6 kg (149 lb 2 oz)    Height:        Intake/Output Summary (Last 24 hours) at 12/26/2017 1041 Last data filed at 12/26/2017 0600 Gross per 24 hour  Intake 540 ml  Output 1275 ml  Net -735 ml   Filed Weights   12/23/17 2036 12/24/17 2021 12/25/17 2118  Weight: 67.6 kg (149 lb) 67.6 kg (149 lb 0.1 oz) 67.6 kg (149 lb 2 oz)    Examination:  General exam: in chair, daughter at bedside, eating breakfast Respiratory system: coarse breath sounds on left Cardiovascular system: rrr Gastrointestinal system: +BS, soft Central nervous system: awake, interactive Psychiatry: appropraite    Data Reviewed: I have personally reviewed following labs and imaging studies  CBC: Recent Labs  Lab 12/22/17 1412 12/23/17 0624 12/25/17 0459 12/26/17 0503  WBC 7.5 6.3 9.3 7.7  NEUTROABS 6.3 5.2  --   --   HGB 13.4 12.4* 12.6* 12.7*  HCT 41.5 39.0 39.2 39.7  MCV 103.5* 104.3* 104.5* 105.3*  PLT 189 185 220 774   Basic Metabolic Panel: Recent Labs  Lab 12/22/17 1412 12/23/17 0624 12/25/17 0459 12/26/17 0503  NA 140 141 143 143  K 4.0 4.1 3.6 2.8*  CL 101 103 102 101  CO2 23 28 29  33*  GLUCOSE 154* 140* 101* 97  BUN 31* 27* 30* 22*  CREATININE  1.07 0.96 0.97 0.86  CALCIUM 8.7* 8.4* 8.4* 8.1*   GFR: Estimated Creatinine Clearance: 49.5 mL/min (by C-G formula based on SCr of 0.86 mg/dL). Liver Function Tests: No results for input(s): AST, ALT, ALKPHOS, BILITOT, PROT, ALBUMIN in the last 168 hours. No results for input(s): LIPASE, AMYLASE in the last 168 hours. No results for input(s): AMMONIA in the last 168 hours. Coagulation Profile: No results for input(s): INR, PROTIME in the last 168 hours. Cardiac Enzymes: No results for input(s): CKTOTAL, CKMB, CKMBINDEX, TROPONINI in the last 168 hours. BNP (last 3 results) No results for input(s): PROBNP in the last 8760 hours. HbA1C: No results for input(s): HGBA1C in the last  72 hours. CBG: No results for input(s): GLUCAP in the last 168 hours. Lipid Profile: No results for input(s): CHOL, HDL, LDLCALC, TRIG, CHOLHDL, LDLDIRECT in the last 72 hours. Thyroid Function Tests: No results for input(s): TSH, T4TOTAL, FREET4, T3FREE, THYROIDAB in the last 72 hours. Anemia Panel: Recent Labs    12/25/17 0459  VITAMINB12 2,289*   Urine analysis:    Component Value Date/Time   COLORURINE YELLOW 12/22/2017 [place] 12/22/2017 1727   LABSPEC 1.020 12/22/2017 1727   PHURINE 5.0 12/22/2017 1727   GLUCOSEU NEGATIVE 12/22/2017 1727   GLUCOSEU NEGATIVE 12/28/2013 0845   HGBUR NEGATIVE 12/22/2017 1727   BILIRUBINUR NEGATIVE 12/22/2017 1727   KETONESUR 5 (A) 12/22/2017 1727   PROTEINUR 30 (A) 12/22/2017 1727   UROBILINOGEN 0.2 12/28/2013 0845   NITRITE NEGATIVE 12/22/2017 1727   LEUKOCYTESUR NEGATIVE 12/22/2017 1727     ) Recent Results (from the past 240 hour(s))  Blood culture (routine x 2)     Status: None (Preliminary result)   Collection Time: 12/22/17  3:23 PM  Result Value Ref Range Status   Specimen Description BLOOD RIGHT FOREARM  Final   Special Requests   Final    BOTTLES DRAWN AEROBIC AND ANAEROBIC Blood Culture adequate volume   Culture   Final    NO GROWTH 3 DAYS Performed at [facility], [place] [address], [place], [place] [address]    Report Status PENDING  Incomplete  Blood culture (routine x 2)     Status: None (Preliminary result)   Collection Time: 12/22/17  3:30 PM  Result Value Ref Range Status   Specimen Description BLOOD LEFT WRIST  Final   Special Requests   Final    BOTTLES DRAWN AEROBIC ONLY Blood Culture results may not be optimal due to an inadequate volume of blood received in culture bottles   Culture   Final    NO GROWTH 3 DAYS Performed at [facility], [place] [address] [place], [place] [address]    Report Status PENDING  Incomplete      Anti-infectives (From admission, onward)   Start      Dose/Rate Route Frequency Ordered Stop   12/24/17 2200  doxycycline (VIBRA-TABS) tablet 100 mg     100 mg Oral Every 12 hours 12/24/17 1613     12/24/17 1000  levofloxacin (LEVAQUIN) IVPB 750 mg  Status:  Discontinued     750 mg 100 mL/hr over 90 Minutes Intravenous Every 48 hours 12/22/17 1839 12/24/17 1613   12/23/17 1200  levofloxacin (LEVAQUIN) IVPB 750 mg  Status:  Discontinued     750 mg 100 mL/hr over 90 Minutes Intravenous  Once 12/22/17 1552 12/22/17 1836   12/22/17 1515  cefTRIAXone (ROCEPHIN) 1 g in sodium chloride 0.9 % 100 mL IVPB  Status:  Discontinued     1 g 200 mL/hr over 30 Minutes Intravenous  Once 12/22/17 1509 12/22/17 1510   12/22/17 1515  azithromycin (ZITHROMAX) 500 mg in sodium chloride 0.9 % 250 mL IVPB  Status:  Discontinued     500 mg 250 mL/hr over 60 Minutes Intravenous  Once 12/22/17 1509 12/22/17 1510   12/22/17 1515  levofloxacin (LEVAQUIN) IVPB 750 mg     750 mg 100 mL/hr over 90 Minutes Intravenous  Once 12/22/17 1510 12/22/17 1711       Radiology Studies: Dg Chest Port 1 View  Result Date: 12/25/2017 CLINICAL DATA:  Shortness of breath. History of atrial fibrillation, community-acquired pneumonia, CHF, former smoker. EXAM: PORTABLE CHEST 1 VIEW COMPARISON:  PA and lateral chest x-ray of December 24, 2016 FINDINGS: The lungs are reasonably well inflated. There is a small right pleural effusion and possible smaller left pleural effusion. The interstitial markings remain increased diffusely on the right and to a lesser extent on the left. The heart is normal in size. There is calcification in the wall of the thoracic aorta. The ICD is in stable position. IMPRESSION: Increased interstitial markings bilaterally greatest on the right worrisome for pneumonia. Asymmetric pulmonary edema is felt less likely. Small right pleural effusion. Followup PA and lateral chest X-ray is recommended in 3-4 weeks following trial of antibiotic therapy to ensure resolution and  exclude underlying malignancy. Electronically Signed   By: David  Martinique M.D.   On: 12/25/2017 14:35        Scheduled Meds: . apixaban  5 mg Oral BID  . carvedilol  25 mg Oral BID  . docusate sodium  100 mg Oral Daily  . doxycycline  100 mg Oral Q12H  . guaiFENesin  600 mg Oral BID  . irbesartan  300 mg Oral QHS  . levothyroxine  75 mcg Oral QAC breakfast  . mouth rinse  15 mL Mouth Rinse BID  . polyethylene glycol  17 g Oral Daily  . polyvinyl alcohol  1 drop Both Eyes QID  . torsemide  30 mg Oral Daily   And  . torsemide  10 mg Oral QPM   Continuous Infusions:    LOS: 4 days    Time spent: 25 min    Geradine Girt, DO [facility] Pager [phone]  If 7PM-7AM, please contact night-coverage [email or web address] Password [facility] 12/26/2017, 10:41 AM

## 2017-12-26 NOTE — Care Management Important Message (Signed)
Important Message  Patient Details  Name: Ricky [name] MRN: [id number] Date of Birth: [birth year]   Medicare Important Message Given:  Yes    Tally Mckinnon [address] 12/26/2017, 12:59 PM

## 2017-12-27 DIAGNOSIS — I10 Essential (primary) hypertension: Secondary | ICD-10-CM

## 2017-12-27 DIAGNOSIS — J189 Pneumonia, unspecified organism: Secondary | ICD-10-CM

## 2017-12-27 DIAGNOSIS — J181 Lobar pneumonia, unspecified organism: Principal | ICD-10-CM

## 2017-12-27 DIAGNOSIS — R262 Difficulty in walking, not elsewhere classified: Secondary | ICD-10-CM

## 2017-12-27 LAB — CBC
HEMATOCRIT: 43.8 % (ref 39.0–52.0)
Hemoglobin: 14.2 g/dL (ref 13.0–17.0)
MCH: 34 pg (ref 26.0–34.0)
MCHC: 32.4 g/dL (ref 30.0–36.0)
MCV: 104.8 fL — ABNORMAL HIGH (ref 78.0–100.0)
PLATELETS: 286 10*3/uL (ref 150–400)
RBC: 4.18 MIL/uL — AB (ref 4.22–5.81)
RDW: 13.7 % (ref 11.5–15.5)
WBC: 9 10*3/uL (ref 4.0–10.5)

## 2017-12-27 LAB — BASIC METABOLIC PANEL
Anion gap: 11 (ref 5–15)
BUN: 22 mg/dL — ABNORMAL HIGH (ref 6–20)
CALCIUM: 8.8 mg/dL — AB (ref 8.9–10.3)
CO2: 32 mmol/L (ref 22–32)
CREATININE: 0.9 mg/dL (ref 0.61–1.24)
Chloride: 100 mmol/L — ABNORMAL LOW (ref 101–111)
GFR calc Af Amer: >60 mL/min (ref >60)
GFR calc non Af Amer: >60 mL/min (ref >60)
GLUCOSE: 127 mg/dL — AB (ref 65–99)
Potassium: 3.5 mmol/L (ref 3.5–5.1)
Sodium: 143 mmol/L (ref 135–145)

## 2017-12-27 LAB — CULTURE, BLOOD (ROUTINE X 2)
Culture: NO GROWTH
Culture: NO GROWTH
Special Requests: ADEQUATE

## 2017-12-27 LAB — MAGNESIUM: Magnesium: 1.9 mg/dL (ref 1.7–2.4)

## 2017-12-27 MED ORDER — DOXYCYCLINE HYCLATE 100 MG PO TABS: 100.0000 mg | ORAL_TABLET | Freq: Two times a day (BID) | ORAL | 0 refills | Status: AC

## 2017-12-27 MED ORDER — GUAIFENESIN ER 600 MG PO TB12: 600.0000 mg | ORAL_TABLET | Freq: Two times a day (BID) | ORAL | 0 refills | Status: AC

## 2017-12-27 NOTE — Progress Notes (Signed)
Physical Therapy Treatment Patient Details Name: ERIKA HUSSAR MRN: [id number] DOB: 12-Feb-1923 Today's Date: 12/27/2017    History of Present Illness Pt is a 84 y.o. male admitted 12/22/17 with CAP. PMH includes anxiety, atrial fibrillation on Eliquis, hypothyroidism, tobacco abuse (former), pacemaker.    PT Comments    Pt making good progress with functional mobility. Of note, pt with increased WOB after ambulation with SPO2 decreasing to as low as 81% on RA with slow recovery to mid 90's with several minutes of sitting rest break and deep breathing. Pt would continue to benefit from skilled physical therapy services at this time while admitted and after d/c to address the below listed limitations in order to improve overall safety and independence with functional mobility.    Follow Up Recommendations  Home health PT;Supervision/Assistance - 24 hour     Equipment Recommendations  Rolling walker with 5" wheels;3in1 (PT)    Recommendations for Other Services       Precautions / Restrictions Precautions Precautions: Fall Restrictions Weight Bearing Restrictions: No    Mobility  Bed Mobility               General bed mobility comments: pt OOB in recliner chair upon arrival  Transfers Overall transfer level: Needs assistance Equipment used: Rolling walker (2 wheeled) Transfers: Sit to/from Stand Sit to Stand: Min guard         General transfer comment: pt performed sit<>stand x5 without RW for strengthening, x1 with RW  Ambulation/Gait Ambulation/Gait assistance: Min guard Ambulation Distance (Feet): 150 Feet Assistive device: Rolling walker (2 wheeled) Gait Pattern/deviations: Step-through pattern;Decreased stride length;Trunk flexed Gait velocity: Decreased Gait velocity interpretation: <1.8 ft/sec, indicate of risk for recurrent falls General Gait Details: pt steady with RW, min guard for safety; no LOB or need for physical assistance   Stairs              Wheelchair Mobility    Modified Rankin (Stroke Patients Only)       Balance Overall balance assessment: Needs assistance Sitting-balance support: Feet supported;No upper extremity supported Sitting balance-Leahy Scale: Good     Standing balance support: Bilateral upper extremity supported Standing balance-Leahy Scale: Poor                              Cognition Arousal/Alertness: Awake/alert Behavior During Therapy: WFL for tasks assessed/performed Overall Cognitive Status: Impaired/Different from baseline Area of Impairment: Problem solving                             Problem Solving: Slow processing;Difficulty sequencing;Requires verbal cues        Exercises General Exercises - Lower Extremity Ankle Circles/Pumps: AROM;Both;10 reps;Seated Long Arc Quad: AROM;Strengthening;Both;10 reps;Seated Hip Flexion/Marching: AROM;Strengthening;Both;10 reps;Seated Heel Raises: AROM;Both;10 reps;Seated Other Exercises Other Exercises: sit<>stand x5 from recliner chair with min guard, no use of an AD, no LOB or need for physical assistance    General Comments General comments (skin integrity, edema, etc.): no family in room this session. EC handout.       Pertinent Vitals/Pain Pain Assessment: No/denies pain    Home Living                      Prior Function            PT Goals (current goals can now be found in the care plan section) Acute Rehab PT  Goals Patient Stated Goal: to return to independent PT Goal Formulation: With patient/family Time For Goal Achievement: 01/07/18 Potential to Achieve Goals: Good Progress towards PT goals: Progressing toward goals    Frequency    Min 3X/week      PT Plan Current plan remains appropriate    Co-evaluation              AM-PAC PT "6 Clicks" Daily Activity  Outcome Measure  Difficulty turning over in bed (including adjusting bedclothes, sheets and blankets)?: A  Little Difficulty moving from lying on back to sitting on the side of the bed? : A Little Difficulty sitting down on and standing up from a chair with arms (e.g., wheelchair, bedside commode, etc,.)?: A Little Help needed moving to and from a bed to chair (including a wheelchair)?: A Little Help needed walking in hospital room?: A Little Help needed climbing 3-5 steps with a railing? : A Lot 6 Click Score: 17    End of Session Equipment Utilized During Treatment: Gait belt Activity Tolerance: Patient tolerated treatment well Patient left: in chair;with call bell/phone within reach;with family/visitor present Nurse Communication: Mobility status PT Visit Diagnosis: Muscle weakness (generalized) (M62.81);Other abnormalities of gait and mobility (R26.89)     Time: 1245-8099 PT Time Calculation (min) (ACUTE ONLY): 25 min  Charges:  $Gait Training: 8-22 mins $Therapeutic Activity: 8-22 mins                    G Codes:       [place], [place], [place] 12/27/2017, 11:33 AM

## 2017-12-27 NOTE — Discharge Summary (Signed)
Physician Discharge Summary  Ricky [name] BMW:[id number] DOB: November 03, 1922 DOA: 12/22/2017  PCP: Cassandria Anger, MD  Admit date: 12/22/2017 Discharge date: 12/27/2017  Admitted From: Home Disposition:  Home  Recommendations for Outpatient Follow-up and new medication changes:  1. Follow up with PCP in 1- week 2. Continue antibiotic therapy for 3 more days   Home Health: yes  Equipment/Devices: walker and bedside commode.   Discharge Condition: stable CODE STATUS: full  Diet recommendation: Regular.   Brief/Interim Summary: 84 year old male who presented with coughing.  Patient does have the significant past medical history for anxiety, atrial fibrillation, hypothyroidism and history of tobacco abuse.  Patient complained of 3-4-day history of worsening generalized weakness, associated with cough and dyspnea.  No fevers or chills.  Positive significant decreased p.o. intake.  On the initial physical examination blood pressure 148/77, heart rate 65, respiratory rate 21, oxygen saturation 96%.  Moist mucous membranes, lungs were showing rhonchi bilateral with increased work of breathing, heart S1-S2 present and rhythmic, no gallops, rubs or murmurs, the abdomen was soft nontender, no lower extremity edema.  Sodium 140, potassium 4.0, chloride 101, bicarb 23, glucose 154, BUN 31, creatinine 1.07, white count 7.5, hemoglobin 13.4, hematocrit 41.5, platelets 189, urine analysis with 30 protein, specific gravity 1.020, 0-5 white cells, 0-5 RBCs.  Chest x-ray, left rotation, right upper lobe infiltrate.  EKG a sensed, V paced.   Patient was admitted to the hospital with the working diagnosis of right upper lobe community-acquired pneumonia.   1.  Right upper lobe community-acquired pneumonia.  Patient was admitted to the medical ward, he was placed on a remote telemetry monitor, IV antibiotic therapy with levofloxacin (allergic to penicillin), oximetry monitoring submental oxygen per nasal  cannula, and bronchodilator therapy.  He responded well to medical therapy, his antibiotics were changed to doxycyclin, confusion presumed to be CNS related side effects from levofloxacin.  Patient will receive 3 more days of doxycycline.  2.  Pulmonary edema due to volume overload, not present on admission.  Patient received IV fluids, developed volume overload, follow-up chest x-ray with pulmonary edema.  Patient received furosemide intravenously with improvement of his volume status.  IV fluids were discontinued.  Echocardiogram showed ejection fraction 55-60% on the left ventricle, no wall motion abnormality.   3.  Metabolic encephalopathy with features of delirium.  Patient developed confusion during his hospital stay, with features of delirium. Patient received supportive medical therapy including diuretics for hypervolemia, discontinue fluoroquinolones, received physical therapy evaluation.  4.  Chronic atrial fibrillation.  Patient remain on V paced rhythm, remain rate controlled, continue apixaban for anticoagulation.  5.  Hypothyroidism.  Levothyroxine 75 mcg daily.  6.  Hypertension.  Patient will continue irbesartan and carvedilol.  Has been on torsemide for a prolonged period of time, this will be continued, old records personally reviewed and patient tends to have elevated potassium in the high 4 range.  His discharge creatinine 0.90, serum bicarbonate 32, potassium 3.7, sodium 143.  To consider stopping diuretics as an outpatient.  For now hold on potassium supplements, will need close follow-up kidney function and electrolytes as an outpatient.   Discharge Diagnoses:  Active Problems:   CAP (community acquired pneumonia)   Dyspnea    Discharge Instructions   Allergies as of 12/27/2017      Reactions   Bee Venom Anaphylaxis   Amiodarone Hcl    Weakness in muscles    Atorvastatin    unknown   Benazepril Hcl    unknown  Clindamycin/lincomycin Other (See Comments)    Abdominal discomfort    Clonidine Hydrochloride    unknown   Colesevelam    unknown   Diltiazem Hcl    unknown   Ezetimibe    Unknown    Verapamil Other (See Comments)   unknown   Amoxicillin Rash   Has patient had a PCN reaction causing immediate rash, facial/tongue/throat swelling, SOB or lightheadedness with hypotension: Yes Has patient had a PCN reaction causing severe rash involving mucus membranes or skin necrosis: No Has patient had a PCN reaction that required hospitalization Unknow Has patient had a PCN reaction occurring within the last 10 years: Unknown If all of the above answers are "NO", then may proceed with Cephalosporin use.      Medication List    TAKE these medications   apixaban 5 MG Tabs tablet Commonly known as:  ELIQUIS Take 1 tablet (5 mg total) by mouth 2 (two) times daily.   carvedilol 25 MG tablet Commonly known as:  COREG Take 1 tablet (25 mg total) by mouth 2 (two) times daily.   Cholecalciferol 1000 units tablet Take 1,000 Units by mouth daily. Reported on 12/23/2015   diphenhydrAMINE 25 mg capsule Commonly known as:  BENADRYL Take 1-2 capsules (25-50 mg total) by mouth every 4 (four) hours as needed for itching or allergies (1-2 for bee stings).   doxycycline 100 MG tablet Commonly known as:  VIBRA-TABS Take 1 tablet (100 mg total) by mouth every 12 (twelve) hours for 4 days.   EPIPEN 2-PAK 0.3 mg/0.3 mL Devi Generic drug:  EPINEPHrine Inject 0.3 mg into the muscle daily as needed (allergic reaction). For allergic reaction   guaiFENesin 600 MG 12 hr tablet Commonly known as:  MUCINEX Take 1 tablet (600 mg total) by mouth 2 (two) times daily for 15 days.   irbesartan 300 MG tablet Commonly known as:  AVAPRO TAKE ONE TABLET BY MOUTH AT BEDTIME   levothyroxine 75 MCG tablet Commonly known as:  SYNTHROID, LEVOTHROID Take 75 mcg by mouth daily.   LUBRICATING EYE DROPS OP Apply 1 drop to eye 5 (five) times daily.   torsemide 10 MG  tablet Commonly known as:  DEMADEX Take 3 tablets (30 mg) by mouth in the AM and 1 tablet (10 mg) by mouth in the PM   VITAMIN C PO Take 1 tablet by mouth daily.            Durable Medical Equipment  (From admission, onward)        Start     Ordered   12/27/17 0000  For home use only DME Walker    Comments:  Rolling walker  Question:  Patient needs a walker to treat with the following condition  Answer:  Ambulatory dysfunction   12/27/17 1236   12/27/17 0000  DME Bedside commode    Question:  Patient needs a bedside commode to treat with the following condition  Answer:  Ambulatory dysfunction   12/27/17 1236   12/26/17 1626  For home use only DME Walker rolling  Once    Question:  Patient needs a walker to treat with the following condition  Answer:  Muscle weakness   12/26/17 1626   12/26/17 1626  For home use only DME 3 n 1  Once     12/26/17 1626     Follow-up Information    Plotnikov, Evie Lacks, MD Follow up.   Specialty:  Internal Medicine Contact information: [place]  [place] Follow up.   Why:  Arranged for delivery to patient room prior to discharge Contact information: [place] [address] [phone]        [facility].   Specialty:  [place] Why:  They will call you to set up initial appointment Contact information: [place] [address] [phone]          Allergies  Allergen Reactions  . Bee Venom Anaphylaxis  . Amiodarone Hcl     Weakness in muscles   . Atorvastatin     unknown  . Benazepril Hcl     unknown  . Clindamycin/Lincomycin Other (See Comments)    Abdominal discomfort   . Clonidine Hydrochloride     unknown  . Colesevelam     unknown  . Diltiazem Hcl     unknown  . Ezetimibe     Unknown   . Verapamil Other (See Comments)    unknown  . Amoxicillin Rash     Has patient had a PCN reaction causing immediate rash, facial/tongue/throat swelling, SOB or lightheadedness with hypotension: Yes Has patient had a PCN reaction causing severe rash involving mucus membranes or skin necrosis: No Has patient had a PCN reaction that required hospitalization Unknow Has patient had a PCN reaction occurring within the last 10 years: Unknown If all of the above answers are "NO", then may proceed with Cephalosporin use.     Consultations:    Procedures/Studies: Dg Chest 2 View  Result Date: 12/24/2017 CLINICAL DATA:  Fever and shortness of breath EXAM: CHEST - 2 VIEW COMPARISON:  December 23, 2017 FINDINGS: Airspace consolidation throughout much the right lung remains. There is new patchy airspace consolidation throughout much of the left upper lobe. Heart size and pulmonary vascularity are normal. Pacemaker leads are attached the right atrium and right ventricle. There is aortic atherosclerosis. There is degenerative change in thoracic spine. No adenopathy appreciable by radiography. IMPRESSION: Multifocal pneumonia with extensive persistent opacification on the right with new patchy airspace opacity in the left upper lobe. Stable cardiac silhouette. There is aortic atherosclerosis. Aortic Atherosclerosis (ICD10-I70.0). Electronically Signed   By: Lowella Grip III M.D.   On: 12/24/2017 08:59   Dg Chest 2 View  Result Date: 12/22/2017 CLINICAL DATA:  84 year old with 5 day history of cough and shortness of breath. Former smoker. EXAM: CHEST - 2 VIEW COMPARISON:  09/06/2015, 10/22/2012 and earlier. FINDINGS: AP ERECT and LATERAL images were obtained. Cardiac silhouette mildly enlarged, unchanged. RIGHT subclavian dual lead transvenous pacemaker unchanged and appears intact. Thoracic aorta mildly atherosclerotic, unchanged. Emphysematous changes throughout both lungs, unchanged. BILATERAL pleural effusions, unchanged. Airspace consolidation involving the RIGHT upper lobe.  Lungs otherwise clear. Pulmonary vascularity normal. Degenerative changes involving the thoracic and UPPER lumbar spine with exaggeration of the usual thoracic kyphosis. IMPRESSION: 1. Acute RIGHT upper lobe pneumonia. 2. Stable mild cardiomegaly without evidence of pulmonary edema. 3. Stable chronic BILATERAL pleural effusions. Electronically Signed   By: Evangeline Dakin M.D.   On: 12/22/2017 15:05   Dg Chest Port 1 View  Result Date: 12/25/2017 CLINICAL DATA:  Shortness of breath. History of atrial fibrillation, community-acquired pneumonia, CHF, former smoker. EXAM: PORTABLE CHEST 1 VIEW COMPARISON:  PA and lateral chest x-ray of December 24, 2016 FINDINGS: The lungs are reasonably well inflated. There is a small right pleural effusion and possible smaller left pleural  effusion. The interstitial markings remain increased diffusely on the right and to a lesser extent on the left. The heart is normal in size. There is calcification in the wall of the thoracic aorta. The ICD is in stable position. IMPRESSION: Increased interstitial markings bilaterally greatest on the right worrisome for pneumonia. Asymmetric pulmonary edema is felt less likely. Small right pleural effusion. Followup PA and lateral chest X-ray is recommended in 3-4 weeks following trial of antibiotic therapy to ensure resolution and exclude underlying malignancy. Electronically Signed   By: David  Martinique M.D.   On: 12/25/2017 14:35   Dg Chest Port 1 View  Result Date: 12/23/2017 CLINICAL DATA:  84 year old male with cough.  Subsequent encounter. EXAM: PORTABLE CHEST 1 VIEW COMPARISON:  [phone] chest x-ray. FINDINGS: Progressive airspace disease throughout the right lung may represent spread of pneumonia. Asymmetric pulmonary edema secondary less likely consideration. Biapical pleural thickening unchanged. Sequential pacemaker in place.  Mild cardiomegaly. Calcified aorta. IMPRESSION: Progressive airspace disease throughout right lung may  represent spread of pneumonia. Asymmetric pulmonary edema secondary less likely consideration. Sequential pacemaker in place.  Mild cardiomegaly. Aortic Atherosclerosis (ICD10-I70.0). Electronically Signed   By: Genia Del M.D.   On: 12/23/2017 07:41       Subjective: Patient is feeling better, dyspnea has improved, last night was not able to sleep due to cough, this am cough has improved, no nausea or vomiting.   Discharge Exam: Vitals:   12/27/17 0442 12/27/17 1021  BP: (!) 152/78 (!) 124/59  Pulse: 66 65  Resp: 19 17  Temp:  (!) 97.5 F (36.4 C)  SpO2: 93% 98%   Vitals:   12/26/17 1742 12/26/17 2044 12/27/17 0442 12/27/17 1021  BP: (!) 151/75 (!) 151/85 (!) 152/78 (!) 124/59  Pulse: 65 65 66 65  Resp: 20 19 19 17   Temp: 98 F (36.7 C) 97.9 F (36.6 C)  (!) 97.5 F (36.4 C)  TempSrc: Oral Oral  Axillary  SpO2: 96% 98% 93% 98%  Weight:  67.6 kg (149 lb 2.1 oz)    Height:        General: not in pain or dyspnea.  Neurology: Awake and alert, non focal  E ENT: mild pallor, no icterus, oral mucosa moist Cardiovascular: No JVD. S1-S2 present, rhythmic, no gallops, rubs, or murmurs. No lower extremity edema. Pulmonary: vesicular breath sounds bilaterally,  no wheezing, rhonchi, scattered bilateral rales rales. Gastrointestinal. Abdomen with no organomegaly, non tender, no rebound or guarding Skin. No rashes Musculoskeletal: no joint deformities   The results of significant diagnostics from this hospitalization (including imaging, microbiology, ancillary and laboratory) are listed below for reference.     Microbiology: Recent Results (from the past 240 hour(s))  Blood culture (routine x 2)     Status: None   Collection Time: 12/22/17  3:23 PM  Result Value Ref Range Status   Specimen Description BLOOD RIGHT FOREARM  Final   Special Requests   Final    BOTTLES DRAWN AEROBIC AND ANAEROBIC Blood Culture adequate volume   Culture   Final    NO GROWTH 5 DAYS Performed  at [facility], [address], [place], [place] [address]    Report Status 12/27/2017 FINAL  Final  Blood culture (routine x 2)     Status: None   Collection Time: 12/22/17  3:30 PM  Result Value Ref Range Status   Specimen Description BLOOD LEFT WRIST  Final   Special Requests   Final    BOTTLES DRAWN  AEROBIC ONLY Blood Culture results may not be optimal due to an inadequate volume of blood received in culture bottles   Culture   Final    NO GROWTH 5 DAYS Performed at [facility], [place] [address] [place], [place] [address]    Report Status 12/27/2017 FINAL  Final     Labs: BNP (last 3 results) No results for input(s): BNP in the last 8760 hours. Basic Metabolic Panel: Recent Labs  Lab 12/22/17 1412 12/23/17 0624 12/25/17 0459 12/26/17 0503 12/27/17 0956  NA 140 141 143 143 143  K 4.0 4.1 3.6 2.8* 3.5  CL 101 103 102 101 100*  CO2 23 28 29  33* 32  GLUCOSE 154* 140* 101* 97 127*  BUN 31* 27* 30* 22* 22*  CREATININE 1.07 0.96 0.97 0.86 0.90  CALCIUM 8.7* 8.4* 8.4* 8.1* 8.8*  MG  --   --   --   --  1.9   Liver Function Tests: No results for input(s): AST, ALT, ALKPHOS, BILITOT, PROT, ALBUMIN in the last 168 hours. No results for input(s): LIPASE, AMYLASE in the last 168 hours. No results for input(s): AMMONIA in the last 168 hours. CBC: Recent Labs  Lab 12/22/17 1412 12/23/17 0624 12/25/17 0459 12/26/17 0503 12/27/17 0956  WBC 7.5 6.3 9.3 7.7 9.0  NEUTROABS 6.3 5.2  --   --   --   HGB 13.4 12.4* 12.6* 12.7* 14.2  HCT 41.5 39.0 39.2 39.7 43.8  MCV 103.5* 104.3* 104.5* 105.3* 104.8*  PLT 189 185 220 226 286   Cardiac Enzymes: No results for input(s): CKTOTAL, CKMB, CKMBINDEX, TROPONINI in the last 168 hours. BNP: Invalid input(s): POCBNP CBG: No results for input(s): GLUCAP in the last 168 hours. D-Dimer No results for input(s): DDIMER in the last 72 hours. Hgb A1c No results for input(s): HGBA1C in the last 72 hours. Lipid Profile No  results for input(s): CHOL, HDL, LDLCALC, TRIG, CHOLHDL, LDLDIRECT in the last 72 hours. Thyroid function studies No results for input(s): TSH, T4TOTAL, T3FREE, THYROIDAB in the last 72 hours.  Invalid input(s): FREET3 Anemia work up Recent Labs    12/25/17 0459  VITAMINB12 2,289*   Urinalysis    Component Value Date/Time   COLORURINE YELLOW 12/22/2017 [place] 12/22/2017 1727   LABSPEC 1.020 12/22/2017 1727   PHURINE 5.0 12/22/2017 1727   GLUCOSEU NEGATIVE 12/22/2017 1727   GLUCOSEU NEGATIVE 12/28/2013 0845   HGBUR NEGATIVE 12/22/2017 1727   BILIRUBINUR NEGATIVE 12/22/2017 1727   KETONESUR 5 (A) 12/22/2017 1727   PROTEINUR 30 (A) 12/22/2017 1727   UROBILINOGEN 0.2 12/28/2013 0845   NITRITE NEGATIVE 12/22/2017 1727   LEUKOCYTESUR NEGATIVE 12/22/2017 1727   Sepsis Labs Invalid input(s): PROCALCITONIN,  WBC,  LACTICIDVEN Microbiology Recent Results (from the past 240 hour(s))  Blood culture (routine x 2)     Status: None   Collection Time: 12/22/17  3:23 PM  Result Value Ref Range Status   Specimen Description BLOOD RIGHT FOREARM  Final   Special Requests   Final    BOTTLES DRAWN AEROBIC AND ANAEROBIC Blood Culture adequate volume   Culture   Final    NO GROWTH 5 DAYS Performed at [facility], [place] [address] [place], [place] [address]    Report Status 12/27/2017 FINAL  Final  Blood culture (routine x 2)     Status: None   Collection Time: 12/22/17  3:30 PM  Result Value Ref Range Status   Specimen Description BLOOD  LEFT WRIST  Final   Special Requests   Final    BOTTLES DRAWN AEROBIC ONLY Blood Culture results may not be optimal due to an inadequate volume of blood received in culture bottles   Culture   Final    NO GROWTH 5 DAYS Performed at [facility], [place] [address], [place], [place] [address]    Report Status 12/27/2017 FINAL  Final     Time coordinating discharge: 45 minutes  SIGNED:   Tawni Millers, MD  [facility] 12/27/2017, 12:05 PM Pager [phone]  If 7PM-7AM, please contact night-coverage [email or web address] Password [email or web address]

## 2017-12-27 NOTE — Progress Notes (Signed)
Occupational Therapy Treatment Patient Details Name: Ricky [name] MRN: [id number] DOB: 1923-08-08 Today's Date: 12/27/2017    History of present illness Pt is a 84 y.o. male admitted 12/22/17 with CAP. PMH includes anxiety, atrial fibrillation on Eliquis, hypothyroidism, tobacco abuse (former), pacemaker.   OT comments  Pt progressing towards acute OT goals. Focus of session was energy conservation education and general UB strengthening exercises using level 1 theraband. D/c plan remains appropriate.    Follow Up Recommendations  Home health OT;Supervision/Assistance - 24 hour    Equipment Recommendations  3 in 1 bedside commode    Recommendations for Other Services      Precautions / Restrictions Precautions Precautions: Fall Restrictions Weight Bearing Restrictions: No       Mobility Bed Mobility               General bed mobility comments: up in recliner  Transfers                 General transfer comment: declined, just walked with PT    Balance                                           ADL either performed or assessed with clinical judgement   ADL Overall ADL's : Needs assistance/impaired                                       General ADL Comments: Pt seen sitting up in recliner. Recently walked with PT. EC handout provided and discussed.      Vision       Perception     Praxis      Cognition Arousal/Alertness: Awake/alert Behavior During Therapy: WFL for tasks assessed/performed Overall Cognitive Status: No family/caregiver present to determine baseline cognitive functioning Area of Impairment: Problem solving                             Problem Solving: Slow processing          Exercises Exercises: Other exercises Other Exercises Other Exercises: Issues theraband level 1 and instructed in 3 basic BUE strenghtening exercises. Good return demonstration, minimal cues for positioning  of grip.  Tolerated well.   Shoulder Instructions       General Comments no family in room this session. EC handout.     Pertinent Vitals/ Pain       Pain Assessment: No/denies pain  Home Living                                          Prior Functioning/Environment              Frequency  Min 2X/week        Progress Toward Goals  OT Goals(current goals can now be found in the care plan section)  Progress towards OT goals: Progressing toward goals  Acute Rehab OT Goals Patient Stated Goal: to return to independent OT Goal Formulation: With patient Time For Goal Achievement: 01/08/18 Potential to Achieve Goals: Good ADL Goals Pt Will Perform Lower Body Bathing: with supervision;sit to/from stand Pt Will Perform Lower Body Dressing: with supervision;sit to/from stand  Pt Will Perform Tub/Shower Transfer: Tub transfer;with supervision;ambulating;3 in 1;rolling walker Pt/caregiver will Perform Home Exercise Program: Increased strength;Both right and left upper extremity;With theraband;Independently;With written HEP provided Additional ADL Goal #1: Pt will independently verbally recall 3 energy conservation strategies and utilize during ADL.  Plan Discharge plan remains appropriate    Co-evaluation                 AM-PAC PT "6 Clicks" Daily Activity     Outcome Measure   Help from another person eating meals?: None Help from another person taking care of personal grooming?: A Little Help from another person toileting, which includes using toliet, bedpan, or urinal?: A Little Help from another person bathing (including washing, rinsing, drying)?: A Little Help from another person to put on and taking off regular upper body clothing?: None Help from another person to put on and taking off regular lower body clothing?: A Little 6 Click Score: 20    End of Session    OT Visit Diagnosis: Unsteadiness on feet (R26.81);Muscle weakness  (generalized) (M62.81)   Activity Tolerance Patient tolerated treatment well   Patient Left in chair;with call bell/phone within reach   Nurse Communication          Time: 6754-4920 OT Time Calculation (min): 19 min  Charges: OT General Charges $OT Visit: 1 Visit OT Treatments $Self Care/Home Management : 8-22 mins     Hortencia Pilar 12/27/2017, 10:52 AM

## 2017-12-27 NOTE — Progress Notes (Signed)
Patient discharged to home with daughter. All discharge instructions, medications, and followup appts reviewed. IV and Telemetry removed. Patients belongings with patient. Patient left unit in stable condition via wheelchair.  Sheliah Plane RN

## 2017-12-30 DIAGNOSIS — J189 Pneumonia, unspecified organism: Secondary | ICD-10-CM | POA: Diagnosis not present

## 2017-12-30 DIAGNOSIS — I482 Chronic atrial fibrillation: Secondary | ICD-10-CM | POA: Diagnosis not present

## 2017-12-30 DIAGNOSIS — I10 Essential (primary) hypertension: Secondary | ICD-10-CM | POA: Diagnosis not present

## 2017-12-30 DIAGNOSIS — E039 Hypothyroidism, unspecified: Secondary | ICD-10-CM | POA: Diagnosis not present

## 2017-12-31 DIAGNOSIS — I10 Essential (primary) hypertension: Secondary | ICD-10-CM | POA: Diagnosis not present

## 2017-12-31 DIAGNOSIS — I482 Chronic atrial fibrillation: Secondary | ICD-10-CM | POA: Diagnosis not present

## 2017-12-31 DIAGNOSIS — E039 Hypothyroidism, unspecified: Secondary | ICD-10-CM | POA: Diagnosis not present

## 2017-12-31 DIAGNOSIS — J189 Pneumonia, unspecified organism: Secondary | ICD-10-CM | POA: Diagnosis not present

## 2018-01-01 ENCOUNTER — Ambulatory Visit (INDEPENDENT_AMBULATORY_CARE_PROVIDER_SITE_OTHER): Payer: Medicare HMO | Admitting: Internal Medicine

## 2018-01-01 ENCOUNTER — Encounter: Payer: Self-pay | Admitting: Internal Medicine

## 2018-01-01 ENCOUNTER — Telehealth: Payer: Self-pay | Admitting: Internal Medicine

## 2018-01-01 DIAGNOSIS — R410 Disorientation, unspecified: Secondary | ICD-10-CM | POA: Diagnosis not present

## 2018-01-01 DIAGNOSIS — R270 Ataxia, unspecified: Secondary | ICD-10-CM

## 2018-01-01 DIAGNOSIS — J189 Pneumonia, unspecified organism: Secondary | ICD-10-CM | POA: Diagnosis not present

## 2018-01-01 DIAGNOSIS — I5022 Chronic systolic (congestive) heart failure: Secondary | ICD-10-CM | POA: Diagnosis not present

## 2018-01-01 MED ORDER — UMECLIDINIUM-VILANTEROL 62.5-25 MCG/INH IN AEPB: 1.0000 | INHALATION_SPRAY | Freq: Every day | RESPIRATORY_TRACT | 5 refills | Status: DC

## 2018-01-01 MED ORDER — TORSEMIDE 20 MG PO TABS: 40.0000 mg | ORAL_TABLET | Freq: Every day | ORAL | 3 refills | Status: DC

## 2018-01-01 MED ORDER — POTASSIUM CHLORIDE ER 8 MEQ PO TBCR: 8.0000 meq | EXTENDED_RELEASE_TABLET | Freq: Two times a day (BID) | ORAL | 3 refills | Status: DC

## 2018-01-01 NOTE — Telephone Encounter (Signed)
LMTCB, pt is to take to new RX only

## 2018-01-01 NOTE — Progress Notes (Signed)
Subjective:  Patient ID: SHONN FARRUGGIA, male    DOB: 1923-02-02  Age: 84 y.o. MRN: [id number]  CC: No chief complaint on file.   HPI EASHAN SCHIPANI presents for post-hosp f/u for CAP. 80% back to nl. Some days are worse than others. C/o cough, some SOB...finished Doxy last night  Per hx:  1.  Right upper lobe community-acquired pneumonia.  Patient was admitted to the medical ward, he was placed on a remote telemetry monitor, IV antibiotic therapy with levofloxacin (allergic to penicillin), oximetry monitoring submental oxygen per nasal cannula, and bronchodilator therapy.  He responded well to medical therapy, his antibiotics were changed to doxycyclin, confusion presumed to be CNS related side effects from levofloxacin.  Patient will receive 3 more days of doxycycline.  2.  Pulmonary edema due to volume overload, not present on admission.  Patient received IV fluids, developed volume overload, follow-up chest x-ray with pulmonary edema.  Patient received furosemide intravenously with improvement of his volume status.  IV fluids were discontinued.  Echocardiogram showed ejection fraction 55-60% on the left ventricle, no wall motion abnormality.   3.  Metabolic encephalopathy with features of delirium.  Patient developed confusion during his hospital stay, with features of delirium. Patient received supportive medical therapy including diuretics for hypervolemia, discontinue fluoroquinolones, received physical therapy evaluation.  4.  Chronic atrial fibrillation.  Patient remain on V paced rhythm, remain rate controlled, continue apixaban for anticoagulation.  5.  Hypothyroidism.  Levothyroxine 75 mcg daily.  6.  Hypertension.  Patient will continue irbesartan and carvedilol.  Has been on torsemide for a prolonged period of time, this will be continued, old records personally reviewed and patient tends to have elevated potassium in the high 4 range.  His discharge creatinine 0.90,  serum bicarbonate 32, potassium 3.7, sodium 143.  To consider stopping diuretics as an outpatient.  For now hold on potassium supplements, will need close follow-up kidney function and electrolytes as an outpatient.      Outpatient Medications Prior to Visit  Medication Sig Dispense Refill  . apixaban (ELIQUIS) 5 MG TABS tablet Take 1 tablet (5 mg total) by mouth 2 (two) times daily. 180 tablet 3  . Ascorbic Acid (VITAMIN C PO) Take 1 tablet by mouth daily.    . Carboxymethylcellul-Glycerin (LUBRICATING EYE DROPS OP) Apply 1 drop to eye 5 (five) times daily.    . Cholecalciferol 1000 UNITS tablet Take 1,000 Units by mouth daily. Reported on 12/23/2015    . EPINEPHrine (EPIPEN 2-PAK) 0.3 mg/0.3 mL DEVI Inject 0.3 mg into the muscle daily as needed (allergic reaction). For allergic reaction    . guaiFENesin (MUCINEX) 600 MG 12 hr tablet Take 1 tablet (600 mg total) by mouth 2 (two) times daily for 15 days. 30 tablet 0  . irbesartan (AVAPRO) 300 MG tablet TAKE ONE TABLET BY MOUTH AT BEDTIME 90 tablet 1  . levothyroxine (SYNTHROID, LEVOTHROID) 75 MCG tablet Take 75 mcg by mouth daily.    Marland Kitchen torsemide (DEMADEX) 10 MG tablet Take 3 tablets (30 mg) by mouth in the AM and 1 tablet (10 mg) by mouth in the PM 360 tablet 3  . carvedilol (COREG) 25 MG tablet Take 1 tablet (25 mg total) by mouth 2 (two) times daily. 180 tablet 3  . diphenhydrAMINE (BENADRYL) 25 mg capsule Take 1-2 capsules (25-50 mg total) by mouth every 4 (four) hours as needed for itching or allergies (1-2 for bee stings). 60 capsule 2   No facility-administered medications prior  to visit.     ROS Review of Systems  Constitutional: Negative for appetite change, fatigue and unexpected weight change.  HENT: Negative for congestion, nosebleeds, sneezing, sore throat and trouble swallowing.   Eyes: Negative for itching and visual disturbance.  Respiratory: Positive for cough and shortness of breath.   Cardiovascular: Positive for leg  swelling. Negative for chest pain and palpitations.  Gastrointestinal: Negative for abdominal distention, blood in stool, diarrhea and nausea.  Genitourinary: Negative for frequency and hematuria.  Musculoskeletal: Negative for back pain, gait problem, joint swelling and neck pain.  Skin: Negative for rash.  Neurological: Positive for weakness. Negative for dizziness, tremors and speech difficulty.  Psychiatric/Behavioral: Negative for agitation, dysphoric mood and sleep disturbance. The patient is not nervous/anxious.     Objective:  BP (!) 142/78 (BP Location: Left Arm, Patient Position: Sitting, Cuff Size: Normal)   Pulse 64   Temp 98.4 F (36.9 C) (Oral)   Ht 5\' 6"  (1.676 m)   Wt 148 lb (67.1 kg)   SpO2 97%   BMI 23.89 kg/m   BP Readings from Last 3 Encounters:  01/01/18 (!) 142/78  12/27/17 (!) 124/59  12/16/17 (!) 148/86    Wt Readings from Last 3 Encounters:  01/01/18 148 lb (67.1 kg)  12/26/17 149 lb 2.1 oz (67.6 kg)  12/16/17 162 lb (73.5 kg)    Physical Exam  Constitutional: He is oriented to person, place, and time. He appears well-developed. No distress.  NAD  HENT:  Mouth/Throat: Oropharynx is clear and moist.  Eyes: Pupils are equal, round, and reactive to light. Conjunctivae are normal.  Neck: Normal range of motion. No JVD present. No thyromegaly present.  Cardiovascular: Normal rate, regular rhythm, normal heart sounds and intact distal pulses. Exam reveals no gallop and no friction rub.  No murmur heard. Pulmonary/Chest: Effort normal. No respiratory distress. He has wheezes. He has no rales. He exhibits no tenderness.  Abdominal: Soft. Bowel sounds are normal. He exhibits no distension and no mass. There is no tenderness. There is no rebound and no guarding.  Musculoskeletal: Normal range of motion. He exhibits edema. He exhibits no tenderness.  Lymphadenopathy:    He has no cervical adenopathy.  Neurological: He is alert and oriented to person, place,  and time. He has normal reflexes. No cranial nerve deficit. He exhibits normal muscle tone. He displays a negative Romberg sign. Coordination and gait normal.  Skin: Skin is warm and dry. No rash noted.  Psychiatric: He has a normal mood and affect. His behavior is normal. Judgment and thought content normal.  coarse BS B LEs with 1+ edema In a w/c   I personally provided Anoro  inhaler use teaching. After the teaching patient was able to demonstrate it's use effectively. All questions were answered  Lab Results  Component Value Date   WBC 9.0 12/27/2017   HGB 14.2 12/27/2017   HCT 43.8 12/27/2017   PLT 286 12/27/2017   GLUCOSE 127 (H) 12/27/2017   CHOL 210 (H) 12/28/2013   TRIG 59.0 12/28/2013   HDL 63.40 12/28/2013   LDLCALC 135 (H) 12/28/2013   ALT 15 12/28/2016   AST 22 12/28/2016   NA 143 12/27/2017   K 3.5 12/27/2017   CL 100 (L) 12/27/2017   CREATININE 0.90 12/27/2017   BUN 22 (H) 12/27/2017   CO2 32 12/27/2017   TSH 1.11 07/30/2017   PSA 0.84 12/28/2013   INR 1.6 (H) 10/17/2016    Dg Chest 2 View  Result  Date: 12/22/2017 CLINICAL DATA:  84 year old with 5 day history of cough and shortness of breath. Former smoker. EXAM: CHEST - 2 VIEW COMPARISON:  09/06/2015, 10/22/2012 and earlier. FINDINGS: AP ERECT and LATERAL images were obtained. Cardiac silhouette mildly enlarged, unchanged. RIGHT subclavian dual lead transvenous pacemaker unchanged and appears intact. Thoracic aorta mildly atherosclerotic, unchanged. Emphysematous changes throughout both lungs, unchanged. BILATERAL pleural effusions, unchanged. Airspace consolidation involving the RIGHT upper lobe. Lungs otherwise clear. Pulmonary vascularity normal. Degenerative changes involving the thoracic and UPPER lumbar spine with exaggeration of the usual thoracic kyphosis. IMPRESSION: 1. Acute RIGHT upper lobe pneumonia. 2. Stable mild cardiomegaly without evidence of pulmonary edema. 3. Stable chronic BILATERAL pleural  effusions. Electronically Signed   By: Evangeline Dakin M.D.   On: 12/22/2017 15:05   Dg Chest Port 1 View  Result Date: 12/23/2017 CLINICAL DATA:  84 year old male with cough.  Subsequent encounter. EXAM: PORTABLE CHEST 1 VIEW COMPARISON:  [phone] chest x-ray. FINDINGS: Progressive airspace disease throughout the right lung may represent spread of pneumonia. Asymmetric pulmonary edema secondary less likely consideration. Biapical pleural thickening unchanged. Sequential pacemaker in place.  Mild cardiomegaly. Calcified aorta. IMPRESSION: Progressive airspace disease throughout right lung may represent spread of pneumonia. Asymmetric pulmonary edema secondary less likely consideration. Sequential pacemaker in place.  Mild cardiomegaly. Aortic Atherosclerosis (ICD10-I70.0). Electronically Signed   By: Genia Del M.D.   On: 12/23/2017 07:41    Assessment & Plan:   There are no diagnoses linked to this encounter. I am having Olyver L. Mcleary maintain his EPINEPHrine, Cholecalciferol, levothyroxine, carvedilol, apixaban, Ascorbic Acid (VITAMIN C PO), diphenhydrAMINE, Carboxymethylcellul-Glycerin (LUBRICATING EYE DROPS OP), irbesartan, torsemide, and guaiFENesin.  No orders of the defined types were placed in this encounter.    Follow-up: No follow-ups on file.  Walker Kehr, MD

## 2018-01-01 NOTE — Patient Instructions (Signed)
Delsium for cough

## 2018-01-01 NOTE — Assessment & Plan Note (Signed)
In a w/c 

## 2018-01-01 NOTE — Assessment & Plan Note (Signed)
Added Anoro CXR in 4 wks Delsium Flutter valve

## 2018-01-01 NOTE — Assessment & Plan Note (Signed)
demadex 40 mg/d Klorcon 8 meq bid

## 2018-01-01 NOTE — Telephone Encounter (Signed)
Copied from [place]. Topic: Quick Communication - Rx Refill/Question >> Jan 01, 2018  4:28 PM Margot Ables wrote: Medication:torsemide [facility]) 20 MG tablet - pt daughter is asking if the new dose is in addition to the old dose or instead of the old dose - please call her to clarify

## 2018-01-01 NOTE — Assessment & Plan Note (Signed)
when sick w/CAP

## 2018-01-02 ENCOUNTER — Ambulatory Visit: Payer: Self-pay

## 2018-01-02 DIAGNOSIS — E039 Hypothyroidism, unspecified: Secondary | ICD-10-CM | POA: Diagnosis not present

## 2018-01-02 DIAGNOSIS — I482 Chronic atrial fibrillation: Secondary | ICD-10-CM | POA: Diagnosis not present

## 2018-01-02 DIAGNOSIS — I10 Essential (primary) hypertension: Secondary | ICD-10-CM | POA: Diagnosis not present

## 2018-01-02 DIAGNOSIS — J189 Pneumonia, unspecified organism: Secondary | ICD-10-CM | POA: Diagnosis not present

## 2018-01-02 NOTE — Telephone Encounter (Signed)
Pt.'s daughter, Festus Holts, reports pt. Had a bowel movement Tuesday. Did not have one yesterday. Feels like he has hard stool he needs to pass.Took a Dulcolax last night and used a suppository this morning. Will try home remedies and a Fleets enema this morning. Instructed to call back as needed. Reason for Disposition . Treating constipation with Over-The-Counter (OTC) medicines, questions about  Answer Assessment - Initial Assessment Questions 1. STOOL PATTERN OR FREQUENCY: "How often do you pass bowel movements (BMs)?"  (Normal range: tid to q 3 days)  "When was the last BM passed?"       Yesterday 2. STRAINING: "Do you have to strain to have a BM?"      Yes 3. RECTAL PAIN: "Does your rectum hurt when the stool comes out?" If so, ask: "Do you have hemorrhoids? How bad is the pain?"  (Scale 1-10; or mild, moderate, severe)     No 4. STOOL COMPOSITION: "Are the stools hard?"      MEDIUM 5. BLOOD ON STOOLS: "Has there been any blood on the toilet tissue or on the surface of the BM?" If so, ask: "When was the last time?"      On tissue 6. CHRONIC CONSTIPATION: "Is this a new problem for you?"  If no, ask: How long have you had this problem?" (days, weeks, months)      No 7. CHANGES IN DIET: "Have there been any recent changes in your diet?"      No 8. MEDICATIONS: "Have you been taking any new medications?"     No 9. LAXATIVES: "Have you been using any laxatives or enemas?"  If yes, ask "What, how often, and when was the last time?"     Took a Dulcolax 10. CAUSE: "What do you think is causing the constipation?"        Not drinking a lot 11. OTHER SYMPTOMS: "Do you have any other symptoms?" (e.g., abdominal pain, fever, vomiting)       Abdominal pain 12. PREGNANCY: "Is there any chance you are pregnant?" "When was your last menstrual period?"       No  Protocols used: CONSTIPATION-A-AH

## 2018-01-03 DIAGNOSIS — E039 Hypothyroidism, unspecified: Secondary | ICD-10-CM | POA: Diagnosis not present

## 2018-01-03 DIAGNOSIS — J189 Pneumonia, unspecified organism: Secondary | ICD-10-CM | POA: Diagnosis not present

## 2018-01-03 DIAGNOSIS — I482 Chronic atrial fibrillation: Secondary | ICD-10-CM | POA: Diagnosis not present

## 2018-01-03 DIAGNOSIS — I10 Essential (primary) hypertension: Secondary | ICD-10-CM | POA: Diagnosis not present

## 2018-01-06 DIAGNOSIS — I10 Essential (primary) hypertension: Secondary | ICD-10-CM | POA: Diagnosis not present

## 2018-01-06 DIAGNOSIS — J189 Pneumonia, unspecified organism: Secondary | ICD-10-CM | POA: Diagnosis not present

## 2018-01-06 DIAGNOSIS — I482 Chronic atrial fibrillation: Secondary | ICD-10-CM | POA: Diagnosis not present

## 2018-01-06 DIAGNOSIS — E039 Hypothyroidism, unspecified: Secondary | ICD-10-CM | POA: Diagnosis not present

## 2018-01-07 ENCOUNTER — Other Ambulatory Visit: Payer: Self-pay

## 2018-01-07 DIAGNOSIS — I482 Chronic atrial fibrillation: Secondary | ICD-10-CM | POA: Diagnosis not present

## 2018-01-07 DIAGNOSIS — I10 Essential (primary) hypertension: Secondary | ICD-10-CM | POA: Diagnosis not present

## 2018-01-07 DIAGNOSIS — J189 Pneumonia, unspecified organism: Secondary | ICD-10-CM | POA: Diagnosis not present

## 2018-01-07 DIAGNOSIS — E039 Hypothyroidism, unspecified: Secondary | ICD-10-CM | POA: Diagnosis not present

## 2018-01-07 NOTE — Patient Outreach (Signed)
[facility]) Care Management  01/07/2018  ZAYVEN POWE 1922/10/10 [id number]     EMMI-PNA RED ON EMMI ALERT Day # 7 Date: 01/06/18 Red Alert Reason: " Been confused? Yes  Swelling in hands/feet or changes in weight? Yes"   Outreach attempt # 1 to patient. No answer. RN CM left HIPAA compliant voicemail message along with contact info.       Plan: RN CM will make outreach attempt to patient within 3-4 business days. RN CM will send unsuccessful outreach letter to patient.    Enzo Montgomery, RN,BSN,CCM [place] [facility] Telephonic Care Management Coordinator Direct Phone: [phone] Toll Free: [phone] Fax: [phone]

## 2018-01-08 ENCOUNTER — Other Ambulatory Visit: Payer: Self-pay

## 2018-01-08 NOTE — Patient Outreach (Signed)
[name]) Care Management  01/08/2018  Ricky [name] [birth year] [id number]   EMMI-PNA RED ON EMMI ALERT Day # 7 Date: 01/06/18 Red Alert Reason: " Been confused? Yes  Swelling in hands/feet or changes in weight? Yes"    Outreach attempt #2 to patient. No answer at present.       Plan: RN CM will make outreach attempt to patient within 3-4 business days.     Enzo Montgomery, RN,BSN,CCM [place] [facility] Telephonic Care Management Coordinator Direct Phone: [phone] Toll Free: [phone] Fax: [phone]

## 2018-01-09 DIAGNOSIS — I10 Essential (primary) hypertension: Secondary | ICD-10-CM | POA: Diagnosis not present

## 2018-01-09 DIAGNOSIS — I482 Chronic atrial fibrillation: Secondary | ICD-10-CM | POA: Diagnosis not present

## 2018-01-09 DIAGNOSIS — E039 Hypothyroidism, unspecified: Secondary | ICD-10-CM | POA: Diagnosis not present

## 2018-01-09 DIAGNOSIS — J189 Pneumonia, unspecified organism: Secondary | ICD-10-CM | POA: Diagnosis not present

## 2018-01-13 ENCOUNTER — Other Ambulatory Visit: Payer: Self-pay

## 2018-01-13 DIAGNOSIS — E039 Hypothyroidism, unspecified: Secondary | ICD-10-CM | POA: Diagnosis not present

## 2018-01-13 DIAGNOSIS — J189 Pneumonia, unspecified organism: Secondary | ICD-10-CM | POA: Diagnosis not present

## 2018-01-13 DIAGNOSIS — I482 Chronic atrial fibrillation: Secondary | ICD-10-CM | POA: Diagnosis not present

## 2018-01-13 DIAGNOSIS — I10 Essential (primary) hypertension: Secondary | ICD-10-CM | POA: Diagnosis not present

## 2018-01-13 NOTE — Patient Outreach (Signed)
[facility]) Care Management  01/13/2018  Ricky [name] 01-Apr-1923 [id number]   EMMI-PNA RED ON EMMI ALERT Day #7 Date:01/06/18 Red Alert Reason:" Been confused? Yes Swelling in hands/feet or changes in weight? Yes"     Outreach attempt #3 to patient. No answer at present and unable to leave message.        Plan: RN CM will close case if no response from letter mailed to patient.    Enzo Montgomery, RN,BSN,CCM [place] [facility] Telephonic Care Management Coordinator Direct Phone: [phone] Toll Free: [phone] Fax: [phone]

## 2018-01-17 DIAGNOSIS — I482 Chronic atrial fibrillation: Secondary | ICD-10-CM | POA: Diagnosis not present

## 2018-01-17 DIAGNOSIS — I10 Essential (primary) hypertension: Secondary | ICD-10-CM | POA: Diagnosis not present

## 2018-01-17 DIAGNOSIS — E039 Hypothyroidism, unspecified: Secondary | ICD-10-CM | POA: Diagnosis not present

## 2018-01-17 DIAGNOSIS — J189 Pneumonia, unspecified organism: Secondary | ICD-10-CM | POA: Diagnosis not present

## 2018-01-20 ENCOUNTER — Other Ambulatory Visit: Payer: Self-pay

## 2018-01-20 DIAGNOSIS — I482 Chronic atrial fibrillation: Secondary | ICD-10-CM | POA: Diagnosis not present

## 2018-01-20 DIAGNOSIS — J189 Pneumonia, unspecified organism: Secondary | ICD-10-CM | POA: Diagnosis not present

## 2018-01-20 DIAGNOSIS — I10 Essential (primary) hypertension: Secondary | ICD-10-CM | POA: Diagnosis not present

## 2018-01-20 DIAGNOSIS — E039 Hypothyroidism, unspecified: Secondary | ICD-10-CM | POA: Diagnosis not present

## 2018-01-20 NOTE — Patient Outreach (Signed)
[facility]) Care Management  01/20/2018  JAIREN GOLDFARB 05-Oct-1922 [id number]   EMMI-PNA RED ON EMMI ALERT Day #7 Date:01/06/18 Red Alert Reason:" Been confused? Yes Swelling in hands/feet or changes in weight? Yes"    Multiple attempts to establish contact with patient without success. No response from letter mailed to patient. Case is being closed at this time.     Plan: RN CM will close case at this time.   Enzo Montgomery, RN,BSN,CCM [place] [facility] Telephonic Care Management Coordinator Direct Phone: [phone] Toll Free: [phone] Fax: [phone]

## 2018-01-21 DIAGNOSIS — I482 Chronic atrial fibrillation: Secondary | ICD-10-CM | POA: Diagnosis not present

## 2018-01-21 DIAGNOSIS — I10 Essential (primary) hypertension: Secondary | ICD-10-CM | POA: Diagnosis not present

## 2018-01-21 DIAGNOSIS — E039 Hypothyroidism, unspecified: Secondary | ICD-10-CM | POA: Diagnosis not present

## 2018-01-21 DIAGNOSIS — J189 Pneumonia, unspecified organism: Secondary | ICD-10-CM | POA: Diagnosis not present

## 2018-01-22 DIAGNOSIS — I482 Chronic atrial fibrillation: Secondary | ICD-10-CM | POA: Diagnosis not present

## 2018-01-22 DIAGNOSIS — E039 Hypothyroidism, unspecified: Secondary | ICD-10-CM | POA: Diagnosis not present

## 2018-01-22 DIAGNOSIS — I10 Essential (primary) hypertension: Secondary | ICD-10-CM | POA: Diagnosis not present

## 2018-01-22 DIAGNOSIS — J189 Pneumonia, unspecified organism: Secondary | ICD-10-CM | POA: Diagnosis not present

## 2018-01-23 DIAGNOSIS — I10 Essential (primary) hypertension: Secondary | ICD-10-CM | POA: Diagnosis not present

## 2018-01-23 DIAGNOSIS — E039 Hypothyroidism, unspecified: Secondary | ICD-10-CM | POA: Diagnosis not present

## 2018-01-23 DIAGNOSIS — I482 Chronic atrial fibrillation: Secondary | ICD-10-CM | POA: Diagnosis not present

## 2018-01-23 DIAGNOSIS — J189 Pneumonia, unspecified organism: Secondary | ICD-10-CM | POA: Diagnosis not present

## 2018-01-24 DIAGNOSIS — I482 Chronic atrial fibrillation: Secondary | ICD-10-CM | POA: Diagnosis not present

## 2018-01-24 DIAGNOSIS — I10 Essential (primary) hypertension: Secondary | ICD-10-CM | POA: Diagnosis not present

## 2018-01-24 DIAGNOSIS — J189 Pneumonia, unspecified organism: Secondary | ICD-10-CM | POA: Diagnosis not present

## 2018-01-24 DIAGNOSIS — E039 Hypothyroidism, unspecified: Secondary | ICD-10-CM | POA: Diagnosis not present

## 2018-01-27 DIAGNOSIS — I482 Chronic atrial fibrillation: Secondary | ICD-10-CM | POA: Diagnosis not present

## 2018-01-27 DIAGNOSIS — I10 Essential (primary) hypertension: Secondary | ICD-10-CM | POA: Diagnosis not present

## 2018-01-27 DIAGNOSIS — J189 Pneumonia, unspecified organism: Secondary | ICD-10-CM | POA: Diagnosis not present

## 2018-01-27 DIAGNOSIS — E039 Hypothyroidism, unspecified: Secondary | ICD-10-CM | POA: Diagnosis not present

## 2018-01-28 ENCOUNTER — Telehealth: Payer: Self-pay | Admitting: Cardiology

## 2018-01-28 ENCOUNTER — Encounter: Payer: Self-pay | Admitting: Internal Medicine

## 2018-01-28 ENCOUNTER — Ambulatory Visit: Payer: Medicare HMO | Admitting: Internal Medicine

## 2018-01-28 ENCOUNTER — Ambulatory Visit: Payer: Medicare HMO | Admitting: *Deleted

## 2018-01-28 VITALS — BP 170/62 | HR 65 | Ht 69.0 in | Wt 160.0 lb

## 2018-01-28 DIAGNOSIS — I482 Chronic atrial fibrillation, unspecified: Secondary | ICD-10-CM

## 2018-01-28 DIAGNOSIS — I442 Atrioventricular block, complete: Secondary | ICD-10-CM | POA: Diagnosis not present

## 2018-01-28 DIAGNOSIS — Z950 Presence of cardiac pacemaker: Secondary | ICD-10-CM

## 2018-01-28 DIAGNOSIS — J189 Pneumonia, unspecified organism: Secondary | ICD-10-CM | POA: Diagnosis not present

## 2018-01-28 DIAGNOSIS — I10 Essential (primary) hypertension: Secondary | ICD-10-CM | POA: Diagnosis not present

## 2018-01-28 DIAGNOSIS — E039 Hypothyroidism, unspecified: Secondary | ICD-10-CM | POA: Diagnosis not present

## 2018-01-28 LAB — CUP PACEART INCLINIC DEVICE CHECK
Battery Remaining Longevity: 96 mo
Battery Voltage: 2.98 V
Brady Statistic RA Percent Paced: 0 %
Brady Statistic RV Percent Paced: 99.81 %
Implantable Lead Implant Date: 19980824
Implantable Lead Implant Date: 19980824
Implantable Lead Location: 753859
Lead Channel Pacing Threshold Amplitude: 0.75 V
Lead Channel Pacing Threshold Amplitude: 0.75 V
Lead Channel Pacing Threshold Pulse Width: 0.4 ms
Lead Channel Pacing Threshold Pulse Width: 0.4 ms
MDC IDC LEAD LOCATION: 753860
MDC IDC MSMT LEADCHNL RA IMPEDANCE VALUE: 325 Ohm
MDC IDC MSMT LEADCHNL RA SENSING INTR AMPL: 5 mV
MDC IDC MSMT LEADCHNL RV IMPEDANCE VALUE: 375 Ohm
MDC IDC MSMT LEADCHNL RV PACING THRESHOLD AMPLITUDE: 0.5 V
MDC IDC MSMT LEADCHNL RV PACING THRESHOLD AMPLITUDE: 0.5 V
MDC IDC MSMT LEADCHNL RV PACING THRESHOLD PULSEWIDTH: 0.8 ms
MDC IDC MSMT LEADCHNL RV PACING THRESHOLD PULSEWIDTH: 0.8 ms
MDC IDC PG IMPLANT DT: 20180214
MDC IDC PG SERIAL: 7998385
MDC IDC SESS DTM: 20190514171241
MDC IDC SET LEADCHNL RA PACING AMPLITUDE: 2 V
MDC IDC SET LEADCHNL RV PACING AMPLITUDE: 2.5 V
MDC IDC SET LEADCHNL RV PACING PULSEWIDTH: 0.8 ms
MDC IDC SET LEADCHNL RV SENSING SENSITIVITY: 6 mV
Pulse Gen Model: 2272

## 2018-01-28 NOTE — Telephone Encounter (Signed)
LMOVM reminding pt to send remote transmission.   

## 2018-01-28 NOTE — Progress Notes (Signed)
Patient Care Team: Cassandria Anger, MD as PCP - General Deboraha Sprang, MD (Cardiology) Clarene Essex, MD as Consulting Physician (Gastroenterology)   HPI  Ricky [name] is a 84 y.o. male is seen in followup for atrial fibrillation that is permanent. He is status post AV junction ablation following 2 failed Pulmonary Vein ablation procedures. He is status post pacemaker implantation and is device dependent.   His device reached ERI January 2018 and he underwent replacement 2/18  No problems post op  Tolerating apixoban without bleeding   Hospitalized recently for pneumonia.;  Better.  Got quite weak and is now walking with a walker.  Quite volume overloaded with bilateral edema and some shortness of breath.  Past Medical History:  Diagnosis Date  . ANXIETY 04/26/2008  . Atrial fibrillation ([place]) 10/31/2010   Coumadin therapy;  Echo 6/12: EF 50-55%, moderate MR, moderate TR, PASP 52, mild LAE  . BPH (benign prostatic hypertrophy)   . CAP (community acquired pneumonia) 12/2017  . Essential hypertension, benign 10/03/2007  . HTN (hypertension) 06/07/2013  . HYPOTHYROIDISM 09/04/2007  . LYMPHADENOPATHY 04/06/2009  . Nocturia   . Pacemaker    Status post AV nodal ablation  . TOBACCO USE, QUIT 07/14/2009    Past Surgical History:  Procedure Laterality Date  . COLONOSCOPY    . EYE SURGERY  2000   eye muscle release  . EYE SURGERY     both cataracts  . HERNIA REPAIR    . INGUINAL HERNIA REPAIR Right 05/06/2013   Procedure: HERNIA REPAIR INGUINAL ADULT;  Surgeon: Haywood Lasso, MD;  Location: [facility];  Service: General;  Laterality: Right;  . PACEMAKER INSERTION  10/16/05  . PPM GENERATOR CHANGEOUT N/A 10/31/2016   Procedure: PPM Generator Changeout;  Surgeon: Deboraha Sprang, MD;  Location: [facility];  Service: Cardiovascular;  Laterality: N/A;    Current Outpatient Medications  Medication Sig Dispense Refill  . apixaban (ELIQUIS) 5  MG TABS tablet Take 1 tablet (5 mg total) by mouth 2 (two) times daily. 180 tablet 3  . Ascorbic Acid (VITAMIN C PO) Take 1 tablet by mouth daily.    . Carboxymethylcellul-Glycerin (LUBRICATING EYE DROPS OP) Apply 1 drop to eye 5 (five) times daily.    . carvedilol (COREG) 25 MG tablet Take 1 tablet (25 mg total) by mouth 2 (two) times daily. 180 tablet 3  . Cholecalciferol 1000 UNITS tablet Take 1,000 Units by mouth daily. Reported on 12/23/2015    . diphenhydrAMINE (BENADRYL) 25 mg capsule Take 1-2 capsules (25-50 mg total) by mouth every 4 (four) hours as needed for itching or allergies (1-2 for bee stings). 60 capsule 2  . EPINEPHrine (EPIPEN 2-PAK) 0.3 mg/0.3 mL DEVI Inject 0.3 mg into the muscle daily as needed (allergic reaction). For allergic reaction    . irbesartan (AVAPRO) 300 MG tablet TAKE ONE TABLET BY MOUTH AT BEDTIME 90 tablet 1  . levothyroxine (SYNTHROID, LEVOTHROID) 75 MCG tablet Take 75 mcg by mouth daily.    . potassium chloride (KLOR-CON) 8 MEQ tablet Take 1 tablet (8 mEq total) by mouth 2 (two) times daily. 180 tablet 3  . torsemide (DEMADEX) 20 MG tablet Take 2 tablets (40 mg total) by mouth daily. 180 tablet 3  . umeclidinium-vilanterol (ANORO ELLIPTA) 62.5-25 MCG/INH AEPB Inhale 1 puff into the lungs daily. 1 each 5   No current facility-administered medications for this visit.     Allergies  Allergen Reactions  .  Bee Venom Anaphylaxis  . Amiodarone Hcl     Weakness in muscles   . Atorvastatin     unknown  . Benazepril Hcl     unknown  . Clindamycin/Lincomycin Other (See Comments)    Abdominal discomfort   . Clonidine Hydrochloride     unknown  . Colesevelam     unknown  . Diltiazem Hcl     unknown  . Ezetimibe     Unknown   . Verapamil Other (See Comments)    unknown  . Amoxicillin Rash    Has patient had a PCN reaction causing immediate rash, facial/tongue/throat swelling, SOB or lightheadedness with hypotension: Yes Has patient had a PCN reaction  causing severe rash involving mucus membranes or skin necrosis: No Has patient had a PCN reaction that required hospitalization Unknow Has patient had a PCN reaction occurring within the last 10 years: Unknown If all of the above answers are "NO", then may proceed with Cephalosporin use.     Review of Systems negative except from HPI and PMH  Physical Exam BP (!) 170/62   Pulse 65   Ht 5\' 9"  (1.753 m)   Wt 160 lb (72.6 kg)   SpO2 97%   BMI 23.63 kg/m  Well developed and well nourished in no acute distress HENT normal E scleral and icterus clear Neck Supple JVP flat; carotids brisk and full Clear to ausculation Device pocket well healed; without hematoma or erythema.  There is no tethering     Regular rate and rhythm, no murmurs gallops or rub Soft with active bowel sounds No clubbing cyanosis  2+ Edema Alert and oriented, grossly normal motor and sensory function Skin Warm and Dry  ECG demonstrates sinus with V pacing  Assessment and  Plan  Atrial fibrillation-thought to be permanent but now with evidence of sinus rhythm  HFpEF  Complete heart block s/p AV ablation  Pacemaker-[employer] The patient's device was interrogated and the information was fully reviewed.  The patient turns out to be in sinus rhythm and the probe device was reprogrammed DDDR  Hypertension well controlled   On Anticoagulation;  No bleeding issues   Blood pressure remains elevated.  We have discussed the importance of fluid and salt restriction.  We will increase his diuretic from 40 daily to 40 twice daily alternating with 40 daily for 1 week.  Hopefully these things and in conjunction with restoration of AV synchrony we will be able to affect his heart failure both in terms of edema as well as in terms of shortness of breath and further decrease his blood pressure  We spent more than 50% of our >25 min visit in face to face counseling regarding the above

## 2018-01-28 NOTE — Patient Instructions (Addendum)
Medication Instructions:  Your physician has recommended you make the following change in your medication:   1. Take torsemide, two tablets (40mg ) in the AM and two tablets (40mg ) in the PM every other day. On your off days, take your usual dosage of two tablets (40mg ) once per day.  Today- Take another two tablets (40mg ) today when you get home Wed- Take your usual dose of two tablets (40mg ) in the AM Thurs- Take two tablets (40mg ) in the AM    Take two tablets (40mg ) in the PM Fri-  Take your usual dose of two tablets (40mg ) in the AM Sat- Take two tablets (40mg ) in the AM           Take two tablets (40mg ) in the PM Sun- Take your usual dose of two tablets (40mg ) in the AM Mon-  Take two tablets (40mg ) in the AM           Take two tablets (40mg ) in the PM Tues- Begin your usual dose of two tablets (40mg ) in the AM     Labwork: None ordered.  Testing/Procedures: None ordered.  Follow-Up: Your physician wants you to follow-up in: One Year with Dr [name]. You will receive a reminder letter in the mail two months in advance. If you don't receive a letter, please call our office to schedule the follow-up appointment.  Remote monitoring is used to monitor your Pacemaker of ICD from home. This monitoring reduces the number of office visits required to check your device to one time per year. It allows [place] to keep an eye on the functioning of your device to ensure it is working properly. You are scheduled for a device check from home on 04/29/2018. You may send your transmission at any time that day. If you have a wireless device, the transmission will be sent automatically. After your physician reviews your transmission, you will receive a postcard with your next transmission date.    Any Other Special Instructions Will Be Listed Below (If Applicable).     If you need a refill on your cardiac medications before your next appointment, please call your pharmacy.

## 2018-01-30 ENCOUNTER — Encounter: Payer: Self-pay | Admitting: Cardiology

## 2018-01-30 ENCOUNTER — Ambulatory Visit: Payer: Self-pay | Admitting: *Deleted

## 2018-01-30 ENCOUNTER — Encounter: Payer: Self-pay | Admitting: Family Medicine

## 2018-01-30 ENCOUNTER — Other Ambulatory Visit (INDEPENDENT_AMBULATORY_CARE_PROVIDER_SITE_OTHER): Payer: Medicare HMO

## 2018-01-30 ENCOUNTER — Ambulatory Visit (INDEPENDENT_AMBULATORY_CARE_PROVIDER_SITE_OTHER)
Admission: RE | Admit: 2018-01-30 | Discharge: 2018-01-30 | Disposition: A | Discharge status: Home / Self Care | Payer: Medicare HMO | Source: Ambulatory Visit | Attending: Family Medicine | Admitting: Family Medicine

## 2018-01-30 ENCOUNTER — Ambulatory Visit (INDEPENDENT_AMBULATORY_CARE_PROVIDER_SITE_OTHER): Payer: Medicare HMO | Admitting: Family Medicine

## 2018-01-30 ENCOUNTER — Telehealth: Payer: Self-pay

## 2018-01-30 ENCOUNTER — Telehealth: Payer: Self-pay | Admitting: Cardiology

## 2018-01-30 VITALS — BP 122/58 | HR 65 | Temp 97.9°F | Ht 69.0 in

## 2018-01-30 DIAGNOSIS — R0602 Shortness of breath: Secondary | ICD-10-CM

## 2018-01-30 LAB — CBC
HEMATOCRIT: 35.6 % — AB (ref 39.0–52.0)
Hemoglobin: 12 g/dL — ABNORMAL LOW (ref 13.0–17.0)
MCHC: 33.7 g/dL (ref 30.0–36.0)
MCV: 103 fl — ABNORMAL HIGH (ref 78.0–100.0)
Platelets: 241 10*3/uL (ref 150.0–400.0)
RBC: 3.45 Mil/uL — ABNORMAL LOW (ref 4.22–5.81)
RDW: 14.1 % (ref 11.5–15.5)
WBC: 9.6 10*3/uL (ref 4.0–10.5)

## 2018-01-30 LAB — BASIC METABOLIC PANEL
BUN: 23 mg/dL (ref 6–23)
CO2: 33 mEq/L — ABNORMAL HIGH (ref 19–32)
Calcium: 8.8 mg/dL (ref 8.4–10.5)
Chloride: 100 mEq/L (ref 96–112)
Creatinine, Ser: 0.91 mg/dL (ref 0.40–1.50)
GFR: 82.72 mL/min (ref >60.00)
GLUCOSE: 136 mg/dL — AB (ref 70–99)
POTASSIUM: 4.6 meq/L (ref 3.5–5.1)
Sodium: 138 mEq/L (ref 135–145)

## 2018-01-30 LAB — BRAIN NATRIURETIC PEPTIDE: Pro B Natriuretic peptide (BNP): 501 pg/mL — ABNORMAL HIGH (ref 0.0–100.0)

## 2018-01-30 NOTE — Patient Instructions (Signed)
Please seek immediate care if your symptoms worsen  I will call you with the results from today  If your lab work and imaging is normal then I would try cutting back on the torsemide and let Dr. [name] know

## 2018-01-30 NOTE — Telephone Encounter (Signed)
Spoke with patient and walked patient through how to send a remote transmission. Transmission received. 0% AT/AF burden since 01/28/2018. Presenting rhythm AP/VP. DDDR turned on at last OV>.

## 2018-01-30 NOTE — Telephone Encounter (Signed)
Appointment  Made  Today  With Clearance Coots at 1030   Reason for Disposition . [1] MILD difficulty breathing (e.g., minimal/no SOB at rest, SOB with walking, pulse <100) AND [2] NEW-onset or WORSE than normal  Answer Assessment - Initial Assessment Questions 1. RESPIRATORY STATUS: "Describe your breathing?" (e.g., wheezing, shortness of breath, unable to speak, severe coughing)      Shortness of breath last night andthis am on mild exertion   2. ONSET: "When did this breathing problem begin?"        Last  Night   3. PATTERN "Does the difficult breathing come and go, or has it been constant since it started?"      Constant  But  Better  Now   4. SEVERITY: "How bad is your breathing?" (e.g., mild, moderate, severe)    - MILD: No SOB at rest, mild SOB with walking, speaks normally in sentences, can lay down, no retractions, pulse < 100.    - MODERATE: SOB at rest, SOB with minimal exertion and prefers to sit, cannot lie down flat, speaks in phrases, mild retractions, audible wheezing, pulse 100-120.    - SEVERE: Very SOB at rest, speaks in single words, struggling to breathe, sitting hunched forward, retractions, pulse > 120        Mild   93  Per cent   5. RECURRENT SYMPTOM: "Have you had difficulty breathing before?" If so, ask: "When was the last time?" and "What happened that time?"         Nor  Recently   6. CARDIAC HISTORY: "Do you have any history of heart disease?" (e.g., heart attack, angina, bypass surgery, angioplasty)      Pacemaker   A  Fib    7. LUNG HISTORY: "Do you have any history of lung disease?"  (e.g., pulmonary embolus, asthma, emphysema)     no8. CAUSE: "What do you think is causing the breathing problem?"         no 9. OTHER SYMPTOMS: "Do you have any other symptoms? (e.g., dizziness, runny nose, cough, chest pain, fever)        NO  10. PREGNANCY: "Is there any chance you are pregnant?" "When was your last menstrual period?"       N/A 11. TRAVEL: "Have you traveled  out of the country in the last month?" (e.g., travel history, exposures)       NO  Protocols used: BREATHING DIFFICULTY-A-AH

## 2018-01-30 NOTE — Telephone Encounter (Signed)
New Message   STAT if HR is under 50 or over 120 (normal HR is 60-100 beats per minute)  1) What is your heart rate? Ranging in the upper 70's  2) Do you have a log of your heart rate readings (document readings)? 74,75,76, 80  3) Do you have any other symptoms? Oxygen level 88,89

## 2018-01-30 NOTE — Progress Notes (Signed)
Opened in error. Remote not received

## 2018-01-30 NOTE — Telephone Encounter (Signed)
Incoming call by patient's daughter regarding patient's worsening shortness of breath overnight.  She states that his oxygen level has been in the upper 80s, normally he ranges in the mid 90s.  He was seen by Dr. [name] on 01/28/2018 with 2+ lower extremity pitting edema and shortness of breath.  Lasix was adjusted from 40 mg daily to 40 mg twice daily.  Daughter states that he felt much better yesterday however is concerned regarding his mild hypoxia with worsening shortness of breath.  Patient thinks it is related to his PPM adjustments made during his office visit.  Discussed the option of calling the office once open for hopeful appointment today or given his mild hypoxia, proceeding to the emergency department for further evaluation.  They will discuss the options and proceed from there.  Kathyrn Drown NP-C [place] Pager: [phone]

## 2018-01-30 NOTE — Progress Notes (Signed)
Ricky [name] - 84 y.o. male MRN [id number]  Date of birth: Jan 20, 1923  SUBJECTIVE:  Including CC & ROS.  Chief Complaint  Patient presents with  . Shortness of Breath    Ricky [name] is a 84 y.o. male that is presenting with shortness of breath. He started feeling short of breath last night when he was up walking. He only felt symptoms when he was standing.  Denies cough. Admits to intermittent wheezing. He was recently hospitalized in April for pneumonia. He was seen by his cardiologist who changed his torsemide dose. He has a pacemaker. He denies any PND. Denies any leg swelling out of the normal. Has been using a wheelchair due to his SOB. Is on eliquis. No coughing.      Review of Systems  Constitutional: Negative for fever.  HENT: Negative for congestion.   Respiratory: Positive for shortness of breath.   Cardiovascular: Positive for leg swelling. Negative for chest pain.  Gastrointestinal: Negative for abdominal pain.  Musculoskeletal: Positive for gait problem.  Skin: Negative for color change.  Neurological: Positive for weakness.  Hematological: Negative for adenopathy.  Psychiatric/Behavioral: Negative for agitation.    HISTORY: Past Medical, Surgical, Social, and Family History Reviewed & Updated per EMR.   Pertinent Historical Findings include:  Past Medical History:  Diagnosis Date  . ANXIETY 04/26/2008  . Atrial fibrillation ([place]) 10/31/2010   Coumadin therapy;  Echo 6/12: EF 50-55%, moderate MR, moderate TR, PASP 52, mild LAE  . BPH (benign prostatic hypertrophy)   . CAP (community acquired pneumonia) 12/2017  . Essential hypertension, benign 10/03/2007  . HTN (hypertension) 06/07/2013  . HYPOTHYROIDISM 09/04/2007  . LYMPHADENOPATHY 04/06/2009  . Nocturia   . Pacemaker    Status post AV nodal ablation  . TOBACCO USE, QUIT 07/14/2009    Past Surgical History:  Procedure Laterality Date  . COLONOSCOPY    . EYE SURGERY  2000   eye muscle release  . EYE  SURGERY     both cataracts  . HERNIA REPAIR    . INGUINAL HERNIA REPAIR Right 05/06/2013   Procedure: HERNIA REPAIR INGUINAL ADULT;  Surgeon: Haywood Lasso, MD;  Location: [facility];  Service: General;  Laterality: Right;  . PACEMAKER INSERTION  10/16/05  . PPM GENERATOR CHANGEOUT N/A 10/31/2016   Procedure: PPM Generator Changeout;  Surgeon: Deboraha Sprang, MD;  Location: [facility];  Service: Cardiovascular;  Laterality: N/A;    Allergies  Allergen Reactions  . Bee Venom Anaphylaxis  . Amiodarone Hcl     Weakness in muscles   . Atorvastatin     unknown  . Benazepril Hcl     unknown  . Clindamycin/Lincomycin Other (See Comments)    Abdominal discomfort   . Clonidine Hydrochloride     unknown  . Colesevelam     unknown  . Diltiazem Hcl     unknown  . Ezetimibe     Unknown   . Verapamil Other (See Comments)    unknown  . Amoxicillin Rash    Has patient had a PCN reaction causing immediate rash, facial/tongue/throat swelling, SOB or lightheadedness with hypotension: Yes Has patient had a PCN reaction causing severe rash involving mucus membranes or skin necrosis: No Has patient had a PCN reaction that required hospitalization Unknow Has patient had a PCN reaction occurring within the last 10 years: Unknown If all of the above answers are "NO", then may proceed with Cephalosporin use.     Family  History  Problem Relation Age of Onset  . Hypertension Mother   . Cancer Mother        uterine/cervical  . Heart disease Father   . Hypertension Other      Social History   Socioeconomic History  . Marital status: Widowed    Spouse name: Not on file  . Number of children: Not on file  . Years of education: Not on file  . Highest education level: Not on file  Occupational History  . Not on file  Social Needs  . Financial resource strain: Not on file  . Food insecurity:    Worry: Not on file    Inability: Not on file  . Transportation  needs:    Medical: Not on file    Non-medical: Not on file  Tobacco Use  . Smoking status: Former Smoker    Packs/day: 1.00    Years: 30.00    Pack years: 30.00    Types: Cigarettes    Last attempt to quit: 05/01/1979    Years since quitting: 38.7  . Smokeless tobacco: Never Used  Substance and Sexual Activity  . Alcohol use: No    Alcohol/week: 0.0 oz  . Drug use: No  . Sexual activity: Never  Lifestyle  . Physical activity:    Days per week: Not on file    Minutes per session: Not on file  . Stress: Not on file  Relationships  . Social connections:    Talks on phone: Not on file    Gets together: Not on file    Attends religious service: Not on file    Active member of club or organization: Not on file    Attends meetings of clubs or organizations: Not on file    Relationship status: Not on file  . Intimate partner violence:    Fear of current or ex partner: Not on file    Emotionally abused: Not on file    Physically abused: Not on file    Forced sexual activity: Not on file  Other Topics Concern  . Not on file  Social History Narrative   Lost his wife 2002 ; Her health was not good.     PHYSICAL EXAM:  VS: BP (!) 122/58 (BP Location: Left Arm, Patient Position: Sitting, Cuff Size: Normal)   Pulse 65   Temp 97.9 F (36.6 C) (Oral)   Ht 5\' 9"  (1.753 m)   SpO2 96%   BMI 23.63 kg/m  Physical Exam Gen: NAD, alert, cooperative with exam, sitting in wheelchair  ENT: normal lips, normal nasal mucosa,  Eye: normal EOM, normal conjunctiva and lids CV:  +1 pitting edema, +2 pedal pulses, regular rate, S1-S2   Resp: no accessory muscle use, non-labored, clear to auscultation bilaterally, no crackles or wheezes Skin: no rashes, no areas of induration  Neuro: normal tone, normal sensation to touch Psych:  normal insight, alert and oriented MSK: normal knee flexion and extension, normal plantarflexion and dorsiflexion       ASSESSMENT & PLAN:   Dyspnea No  coughing or PND.  Could be associated with the increase in torsemide.  Had a case of pneumonia a month or so ago but no fevers currently.  Reports no blood loss acutely.  Is on Eliquis. - CBC, BMP, BNP, chest x-ray -Given indications to seek immediate care -If all normal then would consider cutting back to his previous dose of torsemide and inform his cardiologist.

## 2018-01-30 NOTE — Assessment & Plan Note (Signed)
No coughing or PND.  Could be associated with the increase in torsemide.  Had a case of pneumonia a month or so ago but no fevers currently.  Reports no blood loss acutely.  Is on Eliquis. - CBC, BMP, BNP, chest x-ray -Given indications to seek immediate care -If all normal then would consider cutting back to his previous dose of torsemide and inform his cardiologist.

## 2018-01-30 NOTE — Telephone Encounter (Signed)
Spoke with pt and he is upset over his SOB. He was seen by his PCP today to address this, awaiting CXR results and MD recommendations. Pt recently was hospitalized for pneumonia. Pt states his HR was "jumping high into the 70s today" and he did not understand why. He states "I've never seen anything like it." I assured him that his PPM function was good and showed no dysrhythmia. He was frustrated that I could not give him an answer as to why he was SOB this morning. I assured him that his PCP will be back in contact with him regarding his CXR and perhaps they would have further suggestions.

## 2018-01-31 ENCOUNTER — Telehealth: Payer: Self-pay | Admitting: Family Medicine

## 2018-01-31 NOTE — Telephone Encounter (Signed)
Spoke with patient about his results. Mildly anemic. Chest xay with resolving PNA and possible mild CHF. If still having SOB then advised to follow up with next week. Will try to decrease his dose to prior dose to see if that improves his SOB.    Rosemarie Ax, MD [facility] 01/31/2018, 12:59 PM

## 2018-02-03 ENCOUNTER — Telehealth: Payer: Self-pay | Admitting: Internal Medicine

## 2018-02-03 NOTE — Telephone Encounter (Signed)
Spoke with Festus Holts, DPR on file.  Pt took Torsemide 40mg  AM and then 40mg  PM on day he was seen as instructed.  Pt has not taken this dose since.  Pt was to alternate 40mg  QD/80mg  QD for a week.  The day after the first round of 80mg  pt was very fatigued, pt was up all night urinating and generally felt bad the next day.  Woke up the 2nd day and heart felt like it was racing.  Daughter has been checking HR off and on.  It has been in 60's-70's range.  Pt seen PCP 5/16 and CXR showed mild CHF and clearing of PNA.  PCP recommended pt go back to Torsemide 40mg  QD as 80mg  was felt to be too much and may dehydrate pt  SOB has worsened since seeing Dr. [name].  Daughter states HR has been 60's-70's and O2 has been 87-88 when she first puts oximeter on but comes back up to 96-97% after awhile.  Swelling is about the same.  Pt takes in about 50oz a fluid per day.  Advised I would send message to Dr. [name] for review and advisement.

## 2018-02-03 NOTE — Telephone Encounter (Signed)
New Message:      Pt c/o medication issue:  1. Name of Medication: torsemide (DEMADEX) 20 MG tablet  2. How are you currently taking this medication (dosage and times per day)? Take 2 tablets (40 mg total) by mouth daily.  3. Are you having a reaction (difficulty breathing--STAT)? No  4. What is your medication issue? Every this medication has been increased pt has been really fatigue and complaining of SOB and will not move around and do anything.   Pt would also like to see if pt can be seen by [name] today.

## 2018-02-04 ENCOUNTER — Ambulatory Visit (INDEPENDENT_AMBULATORY_CARE_PROVIDER_SITE_OTHER): Payer: Medicare HMO | Admitting: Internal Medicine

## 2018-02-04 ENCOUNTER — Encounter (INDEPENDENT_AMBULATORY_CARE_PROVIDER_SITE_OTHER): Payer: Self-pay

## 2018-02-04 ENCOUNTER — Encounter: Payer: Self-pay | Admitting: Internal Medicine

## 2018-02-04 VITALS — BP 190/80 | HR 76 | Ht 70.0 in | Wt 155.0 lb

## 2018-02-04 DIAGNOSIS — E039 Hypothyroidism, unspecified: Secondary | ICD-10-CM

## 2018-02-04 DIAGNOSIS — I442 Atrioventricular block, complete: Secondary | ICD-10-CM

## 2018-02-04 DIAGNOSIS — Z950 Presence of cardiac pacemaker: Secondary | ICD-10-CM | POA: Diagnosis not present

## 2018-02-04 DIAGNOSIS — I5033 Acute on chronic diastolic (congestive) heart failure: Secondary | ICD-10-CM | POA: Diagnosis not present

## 2018-02-04 DIAGNOSIS — I482 Chronic atrial fibrillation, unspecified: Secondary | ICD-10-CM

## 2018-02-04 DIAGNOSIS — I5022 Chronic systolic (congestive) heart failure: Secondary | ICD-10-CM

## 2018-02-04 DIAGNOSIS — I480 Paroxysmal atrial fibrillation: Secondary | ICD-10-CM | POA: Diagnosis not present

## 2018-02-04 LAB — CUP PACEART INCLINIC DEVICE CHECK
Brady Statistic RA Percent Paced: 75 %
Brady Statistic RV Percent Paced: 99.25 %
Date Time Interrogation Session: 20190521171521
Implantable Lead Implant Date: 19980824
Implantable Lead Location: 753860
Lead Channel Impedance Value: 312.5 Ohm
Lead Channel Pacing Threshold Amplitude: 0.5 V
Lead Channel Pacing Threshold Amplitude: 0.5 V
Lead Channel Pacing Threshold Amplitude: 0.75 V
Lead Channel Pacing Threshold Pulse Width: 0.8 ms
Lead Channel Pacing Threshold Pulse Width: 0.8 ms
Lead Channel Sensing Intrinsic Amplitude: 4.8 mV
Lead Channel Setting Pacing Amplitude: 2 V
Lead Channel Setting Pacing Pulse Width: 0.8 ms
MDC IDC LEAD IMPLANT DT: 19980824
MDC IDC LEAD LOCATION: 753859
MDC IDC MSMT BATTERY REMAINING LONGEVITY: 82 mo
MDC IDC MSMT BATTERY VOLTAGE: 2.98 V
MDC IDC MSMT LEADCHNL RA PACING THRESHOLD AMPLITUDE: 0.75 V
MDC IDC MSMT LEADCHNL RA PACING THRESHOLD PULSEWIDTH: 0.4 ms
MDC IDC MSMT LEADCHNL RA PACING THRESHOLD PULSEWIDTH: 0.4 ms
MDC IDC MSMT LEADCHNL RV IMPEDANCE VALUE: 375 Ohm
MDC IDC PG IMPLANT DT: 20180214
MDC IDC PG SERIAL: 7998385
MDC IDC SET LEADCHNL RV PACING AMPLITUDE: 2.5 V
MDC IDC SET LEADCHNL RV SENSING SENSITIVITY: 6 mV
Pulse Gen Model: 2272

## 2018-02-04 NOTE — Assessment & Plan Note (Signed)
Rate controlled 

## 2018-02-04 NOTE — Progress Notes (Signed)
Subjective:  Patient ID: Ricky [name], male    DOB: July 20, 1923  Age: 84 y.o. MRN: [id number]  CC: No chief complaint on file.   HPI Ricky [name] presents for SOB and leg swelling. The pt saw Dr [name] and later Raeford Razor. Per pt Torsemide made him SOB. C/o weakness. He will see Dr [name] this pm  Outpatient Medications Prior to Visit  Medication Sig Dispense Refill  . apixaban (ELIQUIS) 5 MG TABS tablet Take 1 tablet (5 mg total) by mouth 2 (two) times daily. 180 tablet 3  . Ascorbic Acid (VITAMIN C PO) Take 1 tablet by mouth daily.    . Carboxymethylcellul-Glycerin (LUBRICATING EYE DROPS OP) Apply 1 drop to eye 5 (five) times daily.    . Cholecalciferol 1000 UNITS tablet Take 1,000 Units by mouth daily. Reported on 12/23/2015    . EPINEPHrine (EPIPEN 2-PAK) 0.3 mg/0.3 mL DEVI Inject 0.3 mg into the muscle daily as needed (allergic reaction). For allergic reaction    . irbesartan (AVAPRO) 300 MG tablet TAKE ONE TABLET BY MOUTH AT BEDTIME 90 tablet 1  . levothyroxine (SYNTHROID, LEVOTHROID) 75 MCG tablet Take 75 mcg by mouth daily.    . potassium chloride (KLOR-CON) 8 MEQ tablet Take 1 tablet (8 mEq total) by mouth 2 (two) times daily. 180 tablet 3  . torsemide (DEMADEX) 20 MG tablet Take 2 tablets (40 mg total) by mouth daily. 180 tablet 3  . umeclidinium-vilanterol (ANORO ELLIPTA) 62.5-25 MCG/INH AEPB Inhale 1 puff into the lungs daily. 1 each 5  . carvedilol (COREG) 25 MG tablet Take 1 tablet (25 mg total) by mouth 2 (two) times daily. 180 tablet 3  . diphenhydrAMINE (BENADRYL) 25 mg capsule Take 1-2 capsules (25-50 mg total) by mouth every 4 (four) hours as needed for itching or allergies (1-2 for bee stings). 60 capsule 2   No facility-administered medications prior to visit.     ROS Review of Systems  Constitutional: Positive for fatigue. Negative for appetite change and unexpected weight change.  HENT: Negative for congestion, nosebleeds, sneezing, sore throat and trouble  swallowing.   Eyes: Negative for itching and visual disturbance.  Respiratory: Positive for shortness of breath. Negative for cough.   Cardiovascular: Positive for leg swelling. Negative for chest pain and palpitations.  Gastrointestinal: Negative for abdominal distention, blood in stool, diarrhea and nausea.  Genitourinary: Negative for frequency and hematuria.  Musculoskeletal: Positive for gait problem. Negative for back pain, joint swelling and neck pain.  Skin: Negative for rash.  Neurological: Positive for weakness. Negative for dizziness, tremors and speech difficulty.  Psychiatric/Behavioral: Negative for agitation, dysphoric mood, sleep disturbance and suicidal ideas. The patient is not nervous/anxious.     Objective:  BP (!) 146/84 (BP Location: Left Arm, Patient Position: Sitting, Cuff Size: Normal)   Temp 98.2 F (36.8 C) (Oral)   Ht 5\' 9"  (1.753 m)   Wt 155 lb (70.3 kg)   BMI 22.89 kg/m   BP Readings from Last 3 Encounters:  02/04/18 (!) 146/84  01/30/18 (!) 122/58  01/28/18 (!) 170/62    Wt Readings from Last 3 Encounters:  02/04/18 155 lb (70.3 kg)  01/28/18 160 lb (72.6 kg)  01/01/18 148 lb (67.1 kg)    Physical Exam  Constitutional: He is oriented to person, place, and time. He appears well-developed. No distress.  NAD  HENT:  Mouth/Throat: Oropharynx is clear and moist.  Eyes: Pupils are equal, round, and reactive to light. Conjunctivae are normal.  Neck: Normal  range of motion. No JVD present. No thyromegaly present.  Cardiovascular: Normal rate, regular rhythm, normal heart sounds and intact distal pulses. Exam reveals no gallop and no friction rub.  No murmur heard. Pulmonary/Chest: Effort normal and breath sounds normal. No respiratory distress. He has no wheezes. He has no rales. He exhibits no tenderness.  Abdominal: Soft. Bowel sounds are normal. He exhibits no distension and no mass. There is no tenderness. There is no rebound and no guarding.    Musculoskeletal: Normal range of motion. He exhibits no edema or tenderness.  Lymphadenopathy:    He has no cervical adenopathy.  Neurological: He is alert and oriented to person, place, and time. He has normal reflexes. No cranial nerve deficit. He exhibits normal muscle tone. He displays a negative Romberg sign. Coordination and gait normal.  Skin: Skin is warm and dry. No rash noted.  Psychiatric: He has a normal mood and affect. His behavior is normal. Judgment and thought content normal.   In a w/c Ankles w1-2+ edema  Lab Results  Component Value Date   WBC 9.6 01/30/2018   HGB 12.0 (L) 01/30/2018   HCT 35.6 (L) 01/30/2018   PLT 241.0 01/30/2018   GLUCOSE 136 (H) 01/30/2018   CHOL 210 (H) 12/28/2013   TRIG 59.0 12/28/2013   HDL 63.40 12/28/2013   LDLCALC 135 (H) 12/28/2013   ALT 15 12/28/2016   AST 22 12/28/2016   NA 138 01/30/2018   K 4.6 01/30/2018   CL 100 01/30/2018   CREATININE 0.91 01/30/2018   BUN 23 01/30/2018   CO2 33 (H) 01/30/2018   TSH 1.11 07/30/2017   PSA 0.84 12/28/2013   INR 1.6 (H) 10/17/2016    Dg Chest 2 View  Result Date: 01/30/2018 CLINICAL DATA:  Short of breath over the last day, history of atrial fibrillation, former smoking history EXAM: CHEST - 2 VIEW COMPARISON:  Portable chest x-ray of 12/25/2017 FINDINGS: The previous right upper lobe pneumonia has cleared. However the heart is mildly enlarged and there are effusions as well as some indistinctness of perihilar vasculature suggesting edema and mild CHF. Permanent pacemaker wires remain. The bones are osteopenic with slight dorsal kyphosis noted. IMPRESSION: 1. Clearing of right upper lobe pneumonia. 2. However there are findings suggestive of mild CHF as noted above. Electronically Signed   By: Ivar Drape M.D.   On: 01/30/2018 16:17    Assessment & Plan:   There are no diagnoses linked to this encounter. I am having Octaviano L. Noberto Retort maintain his EPINEPHrine, Cholecalciferol,  levothyroxine, carvedilol, apixaban, Ascorbic Acid (VITAMIN C PO), diphenhydrAMINE, Carboxymethylcellul-Glycerin (LUBRICATING EYE DROPS OP), irbesartan, potassium chloride, torsemide, and umeclidinium-vilanterol.  No orders of the defined types were placed in this encounter.    Follow-up: No follow-ups on file.  Walker Kehr, MD

## 2018-02-04 NOTE — Patient Instructions (Signed)
Medication Instructions:  Your physician has recommended you make the following change in your medication:   1. When you get home today, take 2 tablets (40mg ) of torsemide 2. Tomorrow, resume your torsemide 2 tablets (40mg ) two times per day for the next 2 days.  Tonight- Take 2 tablets when you get home from the doctors office. Wed- Take 2 tablets in the morning, and two tablets in the late afternoon Thurs- Take 2 tablets in the morning, and two tablets in the late afternoon Friday- Take 2 tablets in the morning, and two tablets in the late afternoon Saturday- Start taking your normal dose of two tablets in the morning.   Labwork: None ordered.  Testing/Procedures: None ordered.  Follow-Up: Your physician recommends that you schedule a follow-up appointment in 2-3 weeks with an APP for fluid volume status.  Remote monitoring is used to monitor your Pacemaker of ICD from home. This monitoring reduces the number of office visits required to check your device to one time per year. It allows [place] to keep an eye on the functioning of your device to ensure it is working properly. You are scheduled for a device check from home on 04/29/2018. You may send your transmission at any time that day. If you have a wireless device, the transmission will be sent automatically. After your physician reviews your transmission, you will receive a postcard with your next transmission date.    Any Other Special Instructions Will Be Listed Below (If Applicable).   DASH Eating Plan DASH stands for "Dietary Approaches to Stop Hypertension." The DASH eating plan is a healthy eating plan that has been shown to reduce high blood pressure (hypertension). It may also reduce your risk for type 2 diabetes, heart disease, and stroke. The DASH eating plan may also help with weight loss. What are tips for following this plan? General guidelines  Avoid eating more than 2,000 mg (milligrams) of salt (sodium) a day. If  you have hypertension, you may need to reduce your sodium intake to 1,500 mg a day.  Limit alcohol intake to no more than 1 drink a day for nonpregnant women and 2 drinks a day for men. One drink equals 12 oz of beer, 5 oz of wine, or 1 oz of hard liquor.  Work with your health care provider to maintain a healthy body weight or to lose weight. Ask what an ideal weight is for you.  Get at least 30 minutes of exercise that causes your heart to beat faster (aerobic exercise) most days of the week. Activities may include walking, swimming, or biking.  Work with your health care provider or diet and nutrition specialist (dietitian) to adjust your eating plan to your individual calorie needs. Reading food labels  Check food labels for the amount of sodium per serving. Choose foods with less than 5 percent of the Daily Value of sodium. Generally, foods with less than 300 mg of sodium per serving fit into this eating plan.  To find whole grains, look for the word "whole" as the first word in the ingredient list. Shopping  Buy products labeled as "low-sodium" or "no salt added."  Buy fresh foods. Avoid canned foods and premade or frozen meals. Cooking  Avoid adding salt when cooking. Use salt-free seasonings or herbs instead of table salt or sea salt. Check with your health care provider or pharmacist before using salt substitutes.  Do not fry foods. Cook foods using healthy methods such as baking, boiling, grilling, and broiling instead.  Cook with heart-healthy oils, such as olive, canola, soybean, or sunflower oil. Meal planning   Eat a balanced diet that includes: ? 5 or more servings of fruits and vegetables each day. At each meal, try to fill half of your plate with fruits and vegetables. ? Up to 6-8 servings of whole grains each day. ? Less than 6 oz of lean meat, poultry, or fish each day. A 3-oz serving of meat is about the same size as a deck of cards. One egg equals 1 oz. ? 2  servings of low-fat dairy each day. ? A serving of nuts, seeds, or beans 5 times each week. ? Heart-healthy fats. Healthy fats called Omega-3 fatty acids are found in foods such as flaxseeds and coldwater fish, like sardines, salmon, and mackerel.  Limit how much you eat of the following: ? Canned or prepackaged foods. ? Food that is high in trans fat, such as fried foods. ? Food that is high in saturated fat, such as fatty meat. ? Sweets, desserts, sugary drinks, and other foods with added sugar. ? Full-fat dairy products.  Do not salt foods before eating.  Try to eat at least 2 vegetarian meals each week.  Eat more home-cooked food and less restaurant, buffet, and fast food.  When eating at a restaurant, ask that your food be prepared with less salt or no salt, if possible. What foods are recommended? The items listed may not be a complete list. Talk with your dietitian about what dietary choices are best for you. Grains Whole-grain or whole-wheat bread. Whole-grain or whole-wheat pasta. Brown rice. [place]. Bulgur. Whole-grain and low-sodium cereals. Pita bread. Low-fat, low-sodium crackers. Whole-wheat flour tortillas. Vegetables Fresh or frozen vegetables (raw, steamed, roasted, or grilled). Low-sodium or reduced-sodium tomato and vegetable juice. Low-sodium or reduced-sodium tomato sauce and tomato paste. Low-sodium or reduced-sodium canned vegetables. Fruits All fresh, dried, or frozen fruit. Canned fruit in natural juice (without added sugar). Meat and other protein foods Skinless chicken or [place]. Ground chicken or [place]. Pork with fat trimmed off. Fish and seafood. Egg whites. Dried beans, peas, or lentils. Unsalted nuts, nut butters, and seeds. Unsalted canned beans. Lean cuts of beef with fat trimmed off. Low-sodium, lean deli meat. Dairy Low-fat (1%) or fat-free (skim) milk. Fat-free, low-fat, or reduced-fat cheeses. Nonfat, low-sodium ricotta or cottage cheese.  Low-fat or nonfat yogurt. Low-fat, low-sodium cheese. Fats and oils Soft margarine without trans fats. Vegetable oil. Low-fat, reduced-fat, or light mayonnaise and salad dressings (reduced-sodium). Canola, safflower, olive, soybean, and sunflower oils. Avocado. Seasoning and other foods Herbs. Spices. Seasoning mixes without salt. Unsalted popcorn and pretzels. Fat-free sweets. What foods are not recommended? The items listed may not be a complete list. Talk with your dietitian about what dietary choices are best for you. Grains Baked goods made with fat, such as croissants, muffins, or some breads. Dry pasta or rice meal packs. Vegetables Creamed or fried vegetables. Vegetables in a cheese sauce. Regular canned vegetables (not low-sodium or reduced-sodium). Regular canned tomato sauce and paste (not low-sodium or reduced-sodium). Regular tomato and vegetable juice (not low-sodium or reduced-sodium). Angie Fava. Olives. Fruits Canned fruit in a light or heavy syrup. Fried fruit. Fruit in cream or butter sauce. Meat and other protein foods Fatty cuts of meat. Ribs. Fried meat. Berniece Salines. Sausage. Bologna and other processed lunch meats. Salami. Fatback. Hotdogs. Bratwurst. Salted nuts and seeds. Canned beans with added salt. Canned or smoked fish. Whole eggs or egg yolks. Chicken or [place] with skin. Dairy  Whole or 2% milk, cream, and half-and-half. Whole or full-fat cream cheese. Whole-fat or sweetened yogurt. Full-fat cheese. Nondairy creamers. Whipped toppings. Processed cheese and cheese spreads. Fats and oils Butter. Stick margarine. Lard. Shortening. Ghee. Bacon fat. Tropical oils, such as coconut, palm kernel, or palm oil. Seasoning and other foods Salted popcorn and pretzels. Onion salt, garlic salt, seasoned salt, table salt, and sea salt. Worcestershire sauce. Tartar sauce. Barbecue sauce. Teriyaki sauce. Soy sauce, including reduced-sodium. Steak sauce. Canned and packaged gravies. Fish sauce.  Oyster sauce. Cocktail sauce. Horseradish that you find on the shelf. Ketchup. Mustard. Meat flavorings and tenderizers. Bouillon cubes. Hot sauce and Tabasco sauce. Premade or packaged marinades. Premade or packaged taco seasonings. Relishes. Regular salad dressings. Where to find more information:  National Heart, Lung, and [place]: https://wilson-eaton.com/  American Heart Association: www.heart.org Summary  The DASH eating plan is a healthy eating plan that has been shown to reduce high blood pressure (hypertension). It may also reduce your risk for type 2 diabetes, heart disease, and stroke.  With the DASH eating plan, you should limit salt (sodium) intake to 2,300 mg a day. If you have hypertension, you may need to reduce your sodium intake to 1,500 mg a day.  When on the DASH eating plan, aim to eat more fresh fruits and vegetables, whole grains, lean proteins, low-fat dairy, and heart-healthy fats.  Work with your health care provider or diet and nutrition specialist (dietitian) to adjust your eating plan to your individual calorie needs. This information is not intended to replace advice given to you by your health care provider. Make sure you discuss any questions you have with your health care provider. Document Released: 08/23/2011 Document Revised: 08/27/2016 Document Reviewed: 08/27/2016 Elsevier Interactive Patient Education  [employer].    If you need a refill on your cardiac medications before your next appointment, please call your pharmacy.

## 2018-02-04 NOTE — Telephone Encounter (Signed)
Pt is coming in for an OV today (5/21).

## 2018-02-04 NOTE — Assessment & Plan Note (Addendum)
Current c/o SOB and leg swelling. CXR w/ mild CHF. The pt saw Dr [name] and later Raeford Razor. Per pt Torsemide made him SOB. C/o weakness. He will see Dr [name] at 3 pm. I suggested inpatient CHF treatment today unless Dr [name] would feel different.

## 2018-02-04 NOTE — Telephone Encounter (Signed)
Spoke with Ricky [name], pt's daughter. She states her father continues to be SOB. Recent CXR showed some mild Pulmonary Edema. Pt has only taken one extra dose of torsemide since being seen in the office on 5/14. He complains he was urinating too much and the torsemide made his heart race. He has not taken any additional torsemide, as directed, since 5/14. Pt's PCP suggested he was on too high of a dose of torsemide. Pt's daughter believes his SOB may have been from recent PPM changes. I have advised Ricky [name] I would speak with Dr [name] as soon as he returns to the office today. This message has also been forwarded to physician.

## 2018-02-04 NOTE — Progress Notes (Signed)
Patient Care Team: Ricky Anger, MD as PCP - General Ricky Sprang, MD (Cardiology) Ricky Essex, MD as Consulting Physician (Gastroenterology)   HPI  [name] is a 84 y.o. male is seen in followup for atrial fibrillation that is permanent. He is status post AV junction ablation following 2 failed Pulmonary Vein ablation procedures. He is status post pacemaker implantation and is device dependent.   His device reached ERI January 2018 and he underwent replacement 2/18  No problems post op  No bleeding on apixaban. Recently hospitalized for pneumonia.  Seen last week because of shortness of breath.  Volume overloaded.  Diuretics initiated; he stopped because he felt like it was making his breathing worse.  He is continued to get increasingly short of breath with nocturnal dyspnea and orthopnea.  He saw his PCP.  Chest x-ray demonstrated ulnar edema and BNP was over 500.     Past Medical History:  Diagnosis Date  . ANXIETY 04/26/2008  . Atrial fibrillation ([place]) 10/31/2010   Coumadin therapy;  Echo 6/12: EF 50-55%, moderate MR, moderate TR, PASP 52, mild LAE  . BPH (benign prostatic hypertrophy)   . CAP (community acquired pneumonia) 12/2017  . Essential hypertension, benign 10/03/2007  . HTN (hypertension) 06/07/2013  . HYPOTHYROIDISM 09/04/2007  . LYMPHADENOPATHY 04/06/2009  . Nocturia   . Pacemaker    Status post AV nodal ablation  . TOBACCO USE, QUIT 07/14/2009    Past Surgical History:  Procedure Laterality Date  . COLONOSCOPY    . EYE SURGERY  2000   eye muscle release  . EYE SURGERY     both cataracts  . HERNIA REPAIR    . INGUINAL HERNIA REPAIR Right 05/06/2013   Procedure: HERNIA REPAIR INGUINAL ADULT;  Surgeon: Haywood Lasso, MD;  Location: [facility];  Service: General;  Laterality: Right;  . PACEMAKER INSERTION  10/16/05  . PPM GENERATOR CHANGEOUT N/A 10/31/2016   Procedure: PPM Generator Changeout;  Surgeon: Ricky Sprang, MD;  Location: [facility];  Service: Cardiovascular;  Laterality: N/A;    Current Outpatient Medications  Medication Sig Dispense Refill  . apixaban (ELIQUIS) 5 MG TABS tablet Take 1 tablet (5 mg total) by mouth 2 (two) times daily. 180 tablet 3  . Ascorbic Acid (VITAMIN C PO) Take 1 tablet by mouth daily.    . Carboxymethylcellul-Glycerin (LUBRICATING EYE DROPS OP) Apply 1 drop to eye 5 (five) times daily.    . Cholecalciferol 1000 UNITS tablet Take 1,000 Units by mouth daily. Reported on 12/23/2015    . EPINEPHrine (EPIPEN 2-PAK) 0.3 mg/0.3 mL DEVI Inject 0.3 mg into the muscle daily as needed (allergic reaction). For allergic reaction    . irbesartan (AVAPRO) 300 MG tablet TAKE ONE TABLET BY MOUTH AT BEDTIME 90 tablet 1  . levothyroxine (SYNTHROID, LEVOTHROID) 75 MCG tablet Take 75 mcg by mouth daily.    . potassium chloride (KLOR-CON) 8 MEQ tablet Take 1 tablet (8 mEq total) by mouth 2 (two) times daily. 180 tablet 3  . torsemide (DEMADEX) 20 MG tablet Take 2 tablets (40 mg total) by mouth daily. 180 tablet 3  . umeclidinium-vilanterol (ANORO ELLIPTA) 62.5-25 MCG/INH AEPB Inhale 1 puff into the lungs daily. 1 each 5  . carvedilol (COREG) 25 MG tablet Take 1 tablet (25 mg total) by mouth 2 (two) times daily. 180 tablet 3  . diphenhydrAMINE (BENADRYL) 25 mg capsule Take 1-2 capsules (25-50 mg total) by mouth  every 4 (four) hours as needed for itching or allergies (1-2 for bee stings). 60 capsule 2   No current facility-administered medications for this visit.     Allergies  Allergen Reactions  . Bee Venom Anaphylaxis  . Amiodarone Hcl     Weakness in muscles   . Atorvastatin     unknown  . Benazepril Hcl     unknown  . Clindamycin/Lincomycin Other (See Comments)    Abdominal discomfort   . Clonidine Hydrochloride     unknown  . Colesevelam     unknown  . Diltiazem Hcl     unknown  . Ezetimibe     Unknown   . Verapamil Other (See Comments)    unknown  .  Amoxicillin Rash    Has patient had a PCN reaction causing immediate rash, facial/tongue/throat swelling, SOB or lightheadedness with hypotension: Yes Has patient had a PCN reaction causing severe rash involving mucus membranes or skin necrosis: No Has patient had a PCN reaction that required hospitalization Unknow Has patient had a PCN reaction occurring within the last 10 years: Unknown If all of the above answers are "NO", then may proceed with Cephalosporin use.     Review of Systems negative except from HPI and PMH  Physical Exam BP (!) 190/80   Pulse 76   Ht 5\' 10"  (1.778 m)   Wt 155 lb (70.3 kg)   SpO2 99%   BMI 22.24 kg/m  Well developed and nourished in no acute distress HENT normal Neck supple with JVP-10 Clear Regular rate and rhythm, no murmurs or gallops Abd-soft with active BS No Clubbing cyanosis 2+ edema Skin-warm and dry A & Oriented  Grossly normal sensory and motor function  ECG demonstrates sinus with V pacing  Assessment and  Plan  Atrial fibrillation-thought to be permanent but now with evidence of sinus rhythm  HFpEF  Complete heart block s/p AV ablation  Pacemaker-[employer]. Jude The patient's device was interrogated and the information was fully reviewed.  The patient turns out to be in sinus rhythm and the probe device was reprogrammed DDDR  Hypertension well controlled   BP elevated.  Likely related to volume overload.    He is markedly volume overloaded.  BNP was elevated chest x-ray showed pulmonary edema.  These were reviewed.  We will resume his torsemide.  40 mg twice daily x2 days then 40 mg daily x1 week and then 40 mg every other day.  He is to let us know if he does not diurese with the torsemide.  We will need to check a metabolic profile in 10 days time.  We will reprogram his rate sensor and decrease his max sensor rate   We spent more than 50% of our >25 min visit in face to face counseling regarding the above

## 2018-02-04 NOTE — Assessment & Plan Note (Signed)
TSH 

## 2018-02-04 NOTE — Telephone Encounter (Signed)
Follow up   Pt is calling, states she was suppose to get a call back this morning telling her what she should do regarding the fatigues and SOB, but has not received anything. Please call

## 2018-02-05 DIAGNOSIS — J189 Pneumonia, unspecified organism: Secondary | ICD-10-CM | POA: Diagnosis not present

## 2018-02-05 DIAGNOSIS — I482 Chronic atrial fibrillation: Secondary | ICD-10-CM | POA: Diagnosis not present

## 2018-02-05 DIAGNOSIS — E039 Hypothyroidism, unspecified: Secondary | ICD-10-CM | POA: Diagnosis not present

## 2018-02-05 DIAGNOSIS — I10 Essential (primary) hypertension: Secondary | ICD-10-CM | POA: Diagnosis not present

## 2018-02-06 DIAGNOSIS — E039 Hypothyroidism, unspecified: Secondary | ICD-10-CM | POA: Diagnosis not present

## 2018-02-06 DIAGNOSIS — I482 Chronic atrial fibrillation: Secondary | ICD-10-CM | POA: Diagnosis not present

## 2018-02-06 DIAGNOSIS — I10 Essential (primary) hypertension: Secondary | ICD-10-CM | POA: Diagnosis not present

## 2018-02-06 DIAGNOSIS — J189 Pneumonia, unspecified organism: Secondary | ICD-10-CM | POA: Diagnosis not present

## 2018-02-07 ENCOUNTER — Telehealth: Payer: Self-pay | Admitting: Internal Medicine

## 2018-02-07 DIAGNOSIS — J189 Pneumonia, unspecified organism: Secondary | ICD-10-CM | POA: Diagnosis not present

## 2018-02-07 DIAGNOSIS — I482 Chronic atrial fibrillation: Secondary | ICD-10-CM | POA: Diagnosis not present

## 2018-02-07 DIAGNOSIS — I10 Essential (primary) hypertension: Secondary | ICD-10-CM | POA: Diagnosis not present

## 2018-02-07 DIAGNOSIS — E039 Hypothyroidism, unspecified: Secondary | ICD-10-CM | POA: Diagnosis not present

## 2018-02-07 NOTE — Telephone Encounter (Signed)
lvm for return call.  

## 2018-02-07 NOTE — Telephone Encounter (Signed)
Spoke with pt's daughter and she stated she was concerned her dad may become fluid overloaded over the weekend. She states he has lost at least 5lbs but has not been able to weigh him since yesterday morning. I advised her take a dry weight of him tomorrow morning and compare with dry weights on Sunday and Monday. If he has gained > 3lbs in one day, he can take an additional 40mg  of Toresmide with a re weigh in the morning. Pt's daughter stated she would call me back on Tues for an update.

## 2018-02-07 NOTE — Telephone Encounter (Signed)
New message   Daughter wants to know if patient should continue torsemide (DEMADEX) 20 MG tablet   Pt c/o swelling: STAT is pt has developed SOB within 24 hours  1) How much weight have you gained and in what time span? N/A  2) If swelling, where is the swelling located? ANKLES  3) Are you currently taking a fluid pill? YES  4) Are you currently SOB? Some shortness of breath and weakness  5) Do you have a log of your daily weights (if so, list)?   6) Have you gained 3 pounds in a day or 5 pounds in a week? N/A  7) Have you traveled recently? NO

## 2018-02-09 ENCOUNTER — Encounter (HOSPITAL_COMMUNITY): Payer: Self-pay | Admitting: Internal Medicine

## 2018-02-09 ENCOUNTER — Inpatient Hospital Stay (HOSPITAL_COMMUNITY)
Admission: EM | Admit: 2018-02-09 | Discharge: 2018-02-13 | DRG: 291 | Disposition: A | Discharge status: Home / Self Care | Payer: Medicare HMO | Attending: Family Medicine | Admitting: Family Medicine

## 2018-02-09 ENCOUNTER — Emergency Department (HOSPITAL_COMMUNITY): Payer: Medicare HMO

## 2018-02-09 DIAGNOSIS — I5031 Acute diastolic (congestive) heart failure: Secondary | ICD-10-CM | POA: Diagnosis not present

## 2018-02-09 DIAGNOSIS — R0602 Shortness of breath: Secondary | ICD-10-CM

## 2018-02-09 DIAGNOSIS — I5023 Acute on chronic systolic (congestive) heart failure: Secondary | ICD-10-CM

## 2018-02-09 DIAGNOSIS — J9600 Acute respiratory failure, unspecified whether with hypoxia or hypercapnia: Secondary | ICD-10-CM | POA: Diagnosis not present

## 2018-02-09 DIAGNOSIS — J9601 Acute respiratory failure with hypoxia: Secondary | ICD-10-CM | POA: Diagnosis not present

## 2018-02-09 DIAGNOSIS — I110 Hypertensive heart disease with heart failure: Principal | ICD-10-CM | POA: Diagnosis present

## 2018-02-09 DIAGNOSIS — R0902 Hypoxemia: Secondary | ICD-10-CM | POA: Diagnosis not present

## 2018-02-09 DIAGNOSIS — I5033 Acute on chronic diastolic (congestive) heart failure: Secondary | ICD-10-CM

## 2018-02-09 DIAGNOSIS — I10 Essential (primary) hypertension: Secondary | ICD-10-CM | POA: Diagnosis not present

## 2018-02-09 DIAGNOSIS — Z8249 Family history of ischemic heart disease and other diseases of the circulatory system: Secondary | ICD-10-CM | POA: Diagnosis not present

## 2018-02-09 DIAGNOSIS — J181 Lobar pneumonia, unspecified organism: Secondary | ICD-10-CM | POA: Diagnosis present

## 2018-02-09 DIAGNOSIS — I5043 Acute on chronic combined systolic (congestive) and diastolic (congestive) heart failure: Secondary | ICD-10-CM | POA: Diagnosis present

## 2018-02-09 DIAGNOSIS — Z79890 Hormone replacement therapy (postmenopausal): Secondary | ICD-10-CM | POA: Diagnosis not present

## 2018-02-09 DIAGNOSIS — Z66 Do not resuscitate: Secondary | ICD-10-CM | POA: Diagnosis not present

## 2018-02-09 DIAGNOSIS — E039 Hypothyroidism, unspecified: Secondary | ICD-10-CM | POA: Diagnosis not present

## 2018-02-09 DIAGNOSIS — H9190 Unspecified hearing loss, unspecified ear: Secondary | ICD-10-CM | POA: Diagnosis not present

## 2018-02-09 DIAGNOSIS — R0603 Acute respiratory distress: Secondary | ICD-10-CM | POA: Diagnosis not present

## 2018-02-09 DIAGNOSIS — I5032 Chronic diastolic (congestive) heart failure: Secondary | ICD-10-CM

## 2018-02-09 DIAGNOSIS — I4821 Permanent atrial fibrillation: Secondary | ICD-10-CM | POA: Diagnosis present

## 2018-02-09 DIAGNOSIS — Z79899 Other long term (current) drug therapy: Secondary | ICD-10-CM | POA: Diagnosis not present

## 2018-02-09 DIAGNOSIS — Z91030 Bee allergy status: Secondary | ICD-10-CM | POA: Diagnosis not present

## 2018-02-09 DIAGNOSIS — I482 Chronic atrial fibrillation: Secondary | ICD-10-CM

## 2018-02-09 DIAGNOSIS — I161 Hypertensive emergency: Secondary | ICD-10-CM | POA: Diagnosis not present

## 2018-02-09 DIAGNOSIS — D539 Nutritional anemia, unspecified: Secondary | ICD-10-CM | POA: Diagnosis present

## 2018-02-09 DIAGNOSIS — Z87891 Personal history of nicotine dependence: Secondary | ICD-10-CM

## 2018-02-09 DIAGNOSIS — I361 Nonrheumatic tricuspid (valve) insufficiency: Secondary | ICD-10-CM | POA: Diagnosis not present

## 2018-02-09 DIAGNOSIS — Z950 Presence of cardiac pacemaker: Secondary | ICD-10-CM | POA: Diagnosis not present

## 2018-02-09 DIAGNOSIS — Z888 Allergy status to other drugs, medicaments and biological substances status: Secondary | ICD-10-CM

## 2018-02-09 DIAGNOSIS — N400 Enlarged prostate without lower urinary tract symptoms: Secondary | ICD-10-CM | POA: Diagnosis not present

## 2018-02-09 DIAGNOSIS — Z7901 Long term (current) use of anticoagulants: Secondary | ICD-10-CM | POA: Diagnosis not present

## 2018-02-09 DIAGNOSIS — I4891 Unspecified atrial fibrillation: Secondary | ICD-10-CM | POA: Diagnosis not present

## 2018-02-09 DIAGNOSIS — J189 Pneumonia, unspecified organism: Secondary | ICD-10-CM | POA: Diagnosis not present

## 2018-02-09 DIAGNOSIS — I509 Heart failure, unspecified: Secondary | ICD-10-CM | POA: Diagnosis not present

## 2018-02-09 HISTORY — DX: Heart failure, unspecified: I50.9

## 2018-02-09 LAB — I-STAT VENOUS BLOOD GAS, ED
Acid-Base Excess: 9 mmol/L — ABNORMAL HIGH (ref 0.0–2.0)
Bicarbonate: 36.1 mmol/L — ABNORMAL HIGH (ref 20.0–28.0)
O2 Saturation: 69 %
PCO2 VEN: 61 mmHg — AB (ref 44.0–60.0)
PH VEN: 7.381 (ref 7.250–7.430)
PO2 VEN: 38 mmHg (ref 32.0–45.0)
TCO2: 38 mmol/L — ABNORMAL HIGH (ref 22–32)

## 2018-02-09 LAB — BRAIN NATRIURETIC PEPTIDE: B NATRIURETIC PEPTIDE 5: 436.9 pg/mL — AB (ref 0.0–100.0)

## 2018-02-09 LAB — COMPREHENSIVE METABOLIC PANEL
ALBUMIN: 2.9 g/dL — AB (ref 3.5–5.0)
ALT: 14 U/L — ABNORMAL LOW (ref 17–63)
AST: 23 U/L (ref 15–41)
Alkaline Phosphatase: 80 U/L (ref 38–126)
Anion gap: 10 (ref 5–15)
BUN: 23 mg/dL — ABNORMAL HIGH (ref 6–20)
CO2: 34 mmol/L — AB (ref 22–32)
Calcium: 8.9 mg/dL (ref 8.9–10.3)
Chloride: 95 mmol/L — ABNORMAL LOW (ref 101–111)
Creatinine, Ser: 1.1 mg/dL (ref 0.61–1.24)
GFR calc Af Amer: >60 mL/min (ref >60)
GFR calc non Af Amer: 56 mL/min — ABNORMAL LOW (ref >60)
GLUCOSE: 132 mg/dL — AB (ref 65–99)
POTASSIUM: 4.5 mmol/L (ref 3.5–5.1)
SODIUM: 139 mmol/L (ref 135–145)
Total Bilirubin: 0.9 mg/dL (ref 0.3–1.2)
Total Protein: 7.2 g/dL (ref 6.5–8.1)

## 2018-02-09 LAB — URINALYSIS, ROUTINE W REFLEX MICROSCOPIC
Bilirubin Urine: NEGATIVE
Glucose, UA: NEGATIVE mg/dL
Hgb urine dipstick: NEGATIVE
KETONES UR: NEGATIVE mg/dL
LEUKOCYTES UA: NEGATIVE
NITRITE: NEGATIVE
PROTEIN: NEGATIVE mg/dL
Specific Gravity, Urine: 1.016 (ref 1.005–1.030)
pH: 7 (ref 5.0–8.0)

## 2018-02-09 LAB — I-STAT TROPONIN, ED: Troponin i, poc: 0.03 ng/mL (ref 0.00–0.08)

## 2018-02-09 LAB — EXPECTORATED SPUTUM ASSESSMENT W REFEX TO RESP CULTURE

## 2018-02-09 LAB — CBC WITH DIFFERENTIAL/PLATELET
Abs Immature Granulocytes: 0.1 10*3/uL (ref 0.0–0.1)
BASOS PCT: 0 %
Basophils Absolute: 0 10*3/uL (ref 0.0–0.1)
EOS ABS: 0.7 10*3/uL (ref 0.0–0.7)
Eosinophils Relative: 6 %
HEMATOCRIT: 42.4 % (ref 39.0–52.0)
Hemoglobin: 13.7 g/dL (ref 13.0–17.0)
IMMATURE GRANULOCYTES: 1 %
LYMPHS ABS: 1.2 10*3/uL (ref 0.7–4.0)
Lymphocytes Relative: 11 %
MCH: 33.7 pg (ref 26.0–34.0)
MCHC: 32.3 g/dL (ref 30.0–36.0)
MCV: 104.2 fL — ABNORMAL HIGH (ref 78.0–100.0)
Monocytes Absolute: 1.5 10*3/uL — ABNORMAL HIGH (ref 0.1–1.0)
Monocytes Relative: 13 %
NEUTROS PCT: 69 %
Neutro Abs: 7.6 10*3/uL (ref 1.7–7.7)
PLATELETS: 464 10*3/uL — AB (ref 150–400)
RBC: 4.07 MIL/uL — AB (ref 4.22–5.81)
RDW: 13.5 % (ref 11.5–15.5)
WBC: 11.2 10*3/uL — AB (ref 4.0–10.5)

## 2018-02-09 LAB — EXPECTORATED SPUTUM ASSESSMENT W GRAM STAIN, RFLX TO RESP C

## 2018-02-09 LAB — I-STAT CG4 LACTIC ACID, ED: LACTIC ACID, VENOUS: 1.64 mmol/L (ref 0.5–1.9)

## 2018-02-09 MED ORDER — VANCOMYCIN HCL IN DEXTROSE 1-5 GM/200ML-% IV SOLN
1000.0000 mg | Freq: Once | INTRAVENOUS | Status: AC
  Administered 2018-02-09: 1000 mg via INTRAVENOUS
  Filled 2018-02-09: qty 200

## 2018-02-09 MED ORDER — SODIUM CHLORIDE 0.9% FLUSH
3.0000 mL | Freq: Two times a day (BID) | INTRAVENOUS | Status: DC
  Administered 2018-02-09 – 2018-02-10 (×2): 3 mL via INTRAVENOUS

## 2018-02-09 MED ORDER — IPRATROPIUM-ALBUTEROL 0.5-2.5 (3) MG/3ML IN SOLN
3.0000 mL | Freq: Four times a day (QID) | RESPIRATORY_TRACT | Status: DC
Start: 2018-02-09 — End: 2018-02-09

## 2018-02-09 MED ORDER — ACETAMINOPHEN 650 MG RE SUPP: 650.0000 mg | Freq: Four times a day (QID) | RECTAL | Status: DC | PRN

## 2018-02-09 MED ORDER — SODIUM CHLORIDE 0.9 % IV SOLN: 250.0000 mL | INTRAVENOUS | Status: DC | PRN

## 2018-02-09 MED ORDER — ONDANSETRON HCL 4 MG PO TABS: 4.0000 mg | ORAL_TABLET | Freq: Four times a day (QID) | ORAL | Status: DC | PRN

## 2018-02-09 MED ORDER — IRBESARTAN 300 MG PO TABS
300.0000 mg | ORAL_TABLET | Freq: Every day | ORAL | Status: DC
  Administered 2018-02-09 – 2018-02-12 (×4): 300 mg via ORAL
  Filled 2018-02-09 (×5): qty 1

## 2018-02-09 MED ORDER — METHYLPREDNISOLONE SODIUM SUCC 125 MG IJ SOLR
60.0000 mg | Freq: Two times a day (BID) | INTRAMUSCULAR | Status: DC
  Administered 2018-02-09 – 2018-02-10 (×3): 60 mg via INTRAVENOUS
  Filled 2018-02-09 (×3): qty 2

## 2018-02-09 MED ORDER — NITROGLYCERIN IN D5W 200-5 MCG/ML-% IV SOLN
0.0000 ug/min | INTRAVENOUS | Status: DC
  Administered 2018-02-09: 35 ug/min via INTRAVENOUS

## 2018-02-09 MED ORDER — FUROSEMIDE 10 MG/ML IJ SOLN
80.0000 mg | Freq: Once | INTRAMUSCULAR | Status: AC
  Administered 2018-02-09: 80 mg via INTRAVENOUS
  Filled 2018-02-09: qty 8

## 2018-02-09 MED ORDER — FUROSEMIDE 10 MG/ML IJ SOLN
80.0000 mg | Freq: Three times a day (TID) | INTRAMUSCULAR | Status: DC
  Administered 2018-02-09 – 2018-02-12 (×8): 80 mg via INTRAVENOUS
  Filled 2018-02-09 (×9): qty 8

## 2018-02-09 MED ORDER — NITROGLYCERIN IN D5W 200-5 MCG/ML-% IV SOLN
0.0000 ug/min | Freq: Once | INTRAVENOUS | Status: AC
  Administered 2018-02-09: 5 ug/min via INTRAVENOUS
  Filled 2018-02-09: qty 250

## 2018-02-09 MED ORDER — APIXABAN 5 MG PO TABS
5.0000 mg | ORAL_TABLET | Freq: Two times a day (BID) | ORAL | Status: DC
  Administered 2018-02-09 – 2018-02-13 (×9): 5 mg via ORAL
  Filled 2018-02-09 (×10): qty 1

## 2018-02-09 MED ORDER — NITROGLYCERIN 0.4 MG SL SUBL
SUBLINGUAL_TABLET | SUBLINGUAL | Status: AC
  Administered 2018-02-09: 08:00:00
  Filled 2018-02-09: qty 1

## 2018-02-09 MED ORDER — BISACODYL 10 MG RE SUPP: 10.0000 mg | Freq: Every day | RECTAL | Status: DC | PRN

## 2018-02-09 MED ORDER — NITROGLYCERIN 0.4 MG SL SUBL: 0.4000 mg | SUBLINGUAL_TABLET | SUBLINGUAL | Status: DC | PRN

## 2018-02-09 MED ORDER — ALBUTEROL SULFATE (2.5 MG/3ML) 0.083% IN NEBU
2.5000 mg | INHALATION_SOLUTION | RESPIRATORY_TRACT | Status: DC | PRN
  Filled 2018-02-09: qty 3

## 2018-02-09 MED ORDER — SODIUM CHLORIDE 0.9% FLUSH: 3.0000 mL | INTRAVENOUS | Status: DC | PRN

## 2018-02-09 MED ORDER — ACETAMINOPHEN 325 MG PO TABS: 650.0000 mg | ORAL_TABLET | Freq: Four times a day (QID) | ORAL | Status: DC | PRN

## 2018-02-09 MED ORDER — SODIUM CHLORIDE 0.9 % IV SOLN
1.0000 g | Freq: Three times a day (TID) | INTRAVENOUS | Status: DC
  Filled 2018-02-09: qty 1

## 2018-02-09 MED ORDER — LEVOTHYROXINE SODIUM 75 MCG PO TABS
75.0000 ug | ORAL_TABLET | Freq: Every day | ORAL | Status: DC
  Administered 2018-02-10 – 2018-02-11 (×2): 75 ug via ORAL
  Filled 2018-02-09 (×2): qty 1

## 2018-02-09 MED ORDER — LEVOFLOXACIN IN D5W 750 MG/150ML IV SOLN
750.0000 mg | INTRAVENOUS | Status: DC
  Filled 2018-02-09: qty 150

## 2018-02-09 MED ORDER — VANCOMYCIN HCL IN DEXTROSE 750-5 MG/150ML-% IV SOLN: 750.0000 mg | Freq: Two times a day (BID) | INTRAVENOUS | Status: DC

## 2018-02-09 MED ORDER — UMECLIDINIUM-VILANTEROL 62.5-25 MCG/INH IN AEPB
1.0000 | INHALATION_SPRAY | Freq: Every day | RESPIRATORY_TRACT | Status: DC
  Filled 2018-02-09: qty 14

## 2018-02-09 MED ORDER — ONDANSETRON HCL 4 MG/2ML IJ SOLN: 4.0000 mg | Freq: Four times a day (QID) | INTRAMUSCULAR | Status: DC | PRN

## 2018-02-09 MED ORDER — CARVEDILOL 25 MG PO TABS
25.0000 mg | ORAL_TABLET | Freq: Two times a day (BID) | ORAL | Status: DC
  Administered 2018-02-09 – 2018-02-10 (×2): 25 mg via ORAL
  Filled 2018-02-09 (×2): qty 1

## 2018-02-09 MED ORDER — IPRATROPIUM-ALBUTEROL 0.5-2.5 (3) MG/3ML IN SOLN
3.0000 mL | Freq: Four times a day (QID) | RESPIRATORY_TRACT | Status: DC
  Administered 2018-02-09 (×2): 3 mL via RESPIRATORY_TRACT
  Filled 2018-02-09 (×2): qty 3

## 2018-02-09 MED ORDER — AZTREONAM 2 G IJ SOLR
2.0000 g | Freq: Once | INTRAMUSCULAR | Status: AC
  Administered 2018-02-09: 2 g via INTRAVENOUS
  Filled 2018-02-09: qty 2

## 2018-02-09 MED ORDER — ALBUTEROL (5 MG/ML) CONTINUOUS INHALATION SOLN
10.0000 mg/h | INHALATION_SOLUTION | Freq: Once | RESPIRATORY_TRACT | Status: AC
  Administered 2018-02-09: 10 mg/h via RESPIRATORY_TRACT
  Filled 2018-02-09: qty 20

## 2018-02-09 MED ORDER — LEVOFLOXACIN IN D5W 500 MG/100ML IV SOLN: 500.0000 mg | INTRAVENOUS | Status: DC

## 2018-02-09 MED ORDER — ALBUTEROL SULFATE (2.5 MG/3ML) 0.083% IN NEBU: 2.5000 mg | INHALATION_SOLUTION | Freq: Once | RESPIRATORY_TRACT | Status: DC

## 2018-02-09 NOTE — ED Triage Notes (Addendum)
Pt here from home c/o shortness of breath since last night prior to going to sleep. Hx of heart failure and pneumonia in early April. Recently increased dose of diuretic d/t increased bilateral lower edema.    RA O2 sat 89%. 98% on 3L.

## 2018-02-09 NOTE — ED Notes (Addendum)
Attempted to call report x 1  

## 2018-02-09 NOTE — Consult Note (Signed)
Cardiology Consultation:   Patient ID: Ricky [name]; [id number]; [birth year]   Admit date: 02/09/2018 Date of Consult: 02/09/2018  Primary Care Provider: Cassandria Anger, MD Primary Cardiologist: Dr. Virl Axe   Patient Profile:   Ricky [name] is a 84 y.o. male with a history of chronic atrial fibrillation, hypertension, AV node ablation with [employer]. Jude pacemaker in place, and diastolic heart failure who is being seen today for the evaluation of progressive shortness of breath and hypoxic respiratory failure at the request of Dr. Jonnie Finner.  History of Present Illness:   [name] was seen recently in the office by Dr. [name] on May 21 reporting increasing shortness of breath, found to be fluid overloaded, and with plan to resume Demadex beginning at 40 mg twice daily for 2 days and then 40 mg once daily for a week, and then every other day thereafter presuming clinical improvement.  He was last hospitalized in April with right upper lobe pneumonia as well as pulmonary edema.  He does not report any recent chest pain or palpitations.  Denies productive cough, fevers or chills.  Chest x-ray in the ER shows congestive heart failure with progressive left upper lobe airspace disease as well as bilateral pleural effusions.  BNP level 436.  Last echocardiogram from 2014 indicated LVEF 55 to 60% range with mild to moderate left atrial enlargement, PASP 40 mmHg.  Past Medical History:  Diagnosis Date  . ANXIETY 04/26/2008  . Atrial fibrillation ([place]) 10/31/2010   Coumadin therapy;  Echo 6/12: EF 50-55%, moderate MR, moderate TR, PASP 52, mild LAE  . BPH (benign prostatic hypertrophy)   . CAP (community acquired pneumonia) 12/2017  . CHF (congestive heart failure) ([place])   . Essential hypertension, benign 10/03/2007  . HTN (hypertension) 06/07/2013  . HYPOTHYROIDISM 09/04/2007  . LYMPHADENOPATHY 04/06/2009  . Nocturia   . Pacemaker    Status post AV nodal ablation  . TOBACCO USE,  QUIT 07/14/2009    Past Surgical History:  Procedure Laterality Date  . COLONOSCOPY    . EYE SURGERY  2000   eye muscle release  . EYE SURGERY     both cataracts  . HERNIA REPAIR    . INGUINAL HERNIA REPAIR Right 05/06/2013   Procedure: HERNIA REPAIR INGUINAL ADULT;  Surgeon: Haywood Lasso, MD;  Location: [facility];  Service: General;  Laterality: Right;  . PACEMAKER INSERTION  10/16/05  . PPM GENERATOR CHANGEOUT N/A 10/31/2016   Procedure: PPM Generator Changeout;  Surgeon: Deboraha Sprang, MD;  Location: [facility];  Service: Cardiovascular;  Laterality: N/A;     Inpatient Medications: Scheduled Meds: . apixaban  5 mg Oral BID  . carvedilol  25 mg Oral BID  . furosemide  80 mg Intravenous Q8H  . ipratropium-albuterol  3 mL Nebulization Q6H  . irbesartan  300 mg Oral QHS  . [START ON 02/10/2018] levothyroxine  75 mcg Oral QAC breakfast  . sodium chloride flush  3 mL Intravenous Q12H   Continuous Infusions: . sodium chloride    . nitroGLYCERIN Stopped (02/09/18 1237)   PRN Meds: sodium chloride, acetaminophen **OR** acetaminophen, albuterol, bisacodyl, ondansetron **OR** ondansetron (ZOFRAN) IV, sodium chloride flush  Allergies:    Allergies  Allergen Reactions  . Bee Venom Anaphylaxis  . Amiodarone Hcl     Weakness in muscles   . Atorvastatin     unknown  . Benazepril Hcl     unknown  . Clindamycin/Lincomycin Other (See Comments)  Abdominal discomfort   . Clonidine Hydrochloride     unknown  . Colesevelam     unknown  . Diltiazem Hcl     unknown  . Ezetimibe     Unknown   . Verapamil Other (See Comments)    unknown  . Amoxicillin Rash    Has patient had a PCN reaction causing immediate rash, facial/tongue/throat swelling, SOB or lightheadedness with hypotension: Yes Has patient had a PCN reaction causing severe rash involving mucus membranes or skin necrosis: No Has patient had a PCN reaction that required hospitalization  Unknow Has patient had a PCN reaction occurring within the last 10 years: Unknown If all of the above answers are "NO", then may proceed with Cephalosporin use.     Social History:   Social History   Socioeconomic History  . Marital status: Widowed    Spouse name: Not on file  . Number of children: Not on file  . Years of education: Not on file  . Highest education level: Not on file  Occupational History  . Not on file  Social Needs  . Financial resource strain: Not on file  . Food insecurity:    Worry: Not on file    Inability: Not on file  . Transportation needs:    Medical: Not on file    Non-medical: Not on file  Tobacco Use  . Smoking status: Former Smoker    Packs/day: 1.00    Years: 30.00    Pack years: 30.00    Types: Cigarettes    Last attempt to quit: 05/01/1979    Years since quitting: 38.8  . Smokeless tobacco: Never Used  Substance and Sexual Activity  . Alcohol use: No    Alcohol/week: 0.0 oz  . Drug use: No  . Sexual activity: Never  Lifestyle  . Physical activity:    Days per week: Not on file    Minutes per session: Not on file  . Stress: Not on file  Relationships  . Social connections:    Talks on phone: Not on file    Gets together: Not on file    Attends religious service: Not on file    Active member of club or organization: Not on file    Attends meetings of clubs or organizations: Not on file    Relationship status: Not on file  . Intimate partner violence:    Fear of current or ex partner: Not on file    Emotionally abused: Not on file    Physically abused: Not on file    Forced sexual activity: Not on file  Other Topics Concern  . Not on file  Social History Narrative   Lost his wife 2002 ; Her health was not good.    Family History:   The patient's family history includes Cancer in his mother; Heart disease in his father; Hypertension in his mother and other.  ROS:  Please see the history of present illness.  Hearing loss,  all other ROS reviewed and negative.     Physical Exam/Data:   Vitals:   02/09/18 1236 02/09/18 1240 02/09/18 1245 02/09/18 1300  BP: (!) 96/58 112/66 (!) 100/56 128/66  Pulse: 65 65 64 64  Resp: (!) 27 (!) 37 (!) 22 (!) 26  Temp:      TempSrc:      SpO2: 100% 100% 98% 100%    Intake/Output Summary (Last 24 hours) at 02/09/2018 1337 Last data filed at 02/09/2018 1304 Gross per 24 hour  Intake -  Output 275 ml  Net -275 ml   There were no vitals filed for this visit. There is no height or weight on file to calculate BMI.   Gen: Frail-appearing elderly male, no active distress. HEENT: Conjunctiva and lids normal, oropharynx clear. Neck: Supple, elevated JVP noted, no carotid bruits. Lungs: Decreased breath sounds at the bases, egophony left upper lobe, intermittent crackles with respiration. Cardiac: Regular rate and rhythm, no S3, soft systolic murmur, no pericardial rub. Abdomen: Soft, nontender, bowel sounds present, no guarding or rebound. Extremities: 1-2+ lower leg edema, distal pulses 2+. Skin: Warm and dry. Musculoskeletal: Mild kyphosis. Neuropsychiatric: Alert and oriented x3, affect grossly appropriate.  EKG:  I personally reviewed the tracing from 02/09/2018 which shows a ventricular paced rhythm.  Telemetry:  I personally reviewed telemetry which shows ventricular pacing.  Relevant CV Studies:  Echocardiogram 11/03/2012: Study Conclusions  - Left ventricle: The cavity size was normal. Wall thickness was normal. Systolic function was normal. The estimated ejection fraction was in the range of 55% to 60%. Wall motion was normal; there were no regional wall motion abnormalities. - Left atrium: The atrium was mildly to moderately dilated. - Right atrium: The atrium was mildly dilated. - Pulmonary arteries: PA peak pressure: 50mm Hg (S).  Laboratory Data:  Chemistry Recent Labs  Lab 02/09/18 0841  NA 139  K 4.5  CL 95*  CO2 34*  GLUCOSE 132*   BUN 23*  CREATININE 1.10  CALCIUM 8.9  GFRNONAA 56*  GFRAA >60  ANIONGAP 10    Recent Labs  Lab 02/09/18 0841  PROT 7.2  ALBUMIN 2.9*  AST 23  ALT 14*  ALKPHOS 80  BILITOT 0.9   Hematology Recent Labs  Lab 02/09/18 0841  WBC 11.2*  RBC 4.07*  HGB 13.7  HCT 42.4  MCV 104.2*  MCH 33.7  MCHC 32.3  RDW 13.5  PLT 464*   Cardiac EnzymesNo results for input(s): TROPONINI in the last 168 hours.  Recent Labs  Lab 02/09/18 0900  TROPIPOC 0.03    BNP Recent Labs  Lab 02/09/18 0841  BNP 436.9*     Radiology/Studies:  Dg Chest Port 1 View  Result Date: 02/09/2018 CLINICAL DATA:  Shortness of breath. EXAM: PORTABLE CHEST 1 VIEW COMPARISON:  01/30/2018 FINDINGS: Right chest wall pacer device noted with lead in the right atrial appendage and right ventricle. The heart size appears within normal limits. Aortic atherosclerotic calcifications identified. Bilateral pleural effusions and pulmonary edema identified compatible with CHF. Progressive airspace disease within the left upper lobe is identified compatible with pneumonia. IMPRESSION: 1. Left upper lobe pneumonia. 2. Congestive heart failure 3.  Aortic Atherosclerosis (ICD10-I70.0). Electronically Signed   By: Kerby Moors M.D.   On: 02/09/2018 08:12    Assessment and Plan:   1.  Progressive shortness of breath with hypoxic respiratory failure, presumably secondary to acute on chronic diastolic heart failure and perhaps complicated by pneumonia.  Last LVEF assessed in 2014 was in the range of 55 to 60%.  He was recently seen by Dr. [name] with reinitiation of oral Demadex, although had no substantial clinical improvement.  2.  Hospitalization in April for treatment of right upper lobe pneumonia.  Present chest x-ray suggests increasing infiltrates in the left upper lobe.  He is afebrile, WBC 11.2.  Management per primary team.  3.  Chronic atrial fibrillation status post AV node ablation with St. Jude pacemaker in place.   He follows with Dr. [name].  He is  on Eliquis as an outpatient for stroke prophylaxis.  4.  Essential hypertension, on Coreg and Avapro as an outpatient.  Patient being admitted to the hospitalist service.  He has been placed on high-dose IV Lasix, would follow urine output and renal function.  Follow-up echocardiogram will be arranged as well for reevaluation of cardiac structure and function as this may help to guide medical therapy adjustments.   Signed, Rozann Lesches, MD  02/09/2018 1:37 PM

## 2018-02-09 NOTE — Progress Notes (Signed)
Pt's breathing improving with combination of lasix IV and nebulizer, moving more air now.  Will cont nebs and add IV steroids appears to have some COPD component, former smoker. Also will add back IV abx with levaquin to start tomorrow (got vanc/ aztreonam already in ed today) given sig PCN allergy.     Kelly Splinter MD [facility] 02/09/2018, 9:25 AM

## 2018-02-09 NOTE — ED Notes (Signed)
Respiratory at the bedside

## 2018-02-09 NOTE — ED Notes (Signed)
Respiratory made aware of need for hour long neb treatment. States possible delay. Patient made aware.

## 2018-02-09 NOTE — ED Notes (Signed)
hospitalist at the bedside 

## 2018-02-09 NOTE — H&P (Addendum)
[facility] History and Physical  Ricky [name] VVO:[id number] DOB: [birth year] DOA: 02/09/2018  Referring physician: Dr Leonette Monarch PCP: Cassandria Anger, MD   Chief Complaint: SOB  HPI: Ricky [name] is a 84 y.o. male with hx of PPM past smoker HTN, CHF, pNA, BPH and atrial fibrillation presenting to ED with 24 hr SOB.  Was admitted here in April with PNA and CHF.  Recently saw cardiologist and diuretics were increased due to suspicion of CHF. ED eval showed RR 28- 44, HR 70 BP 180/95 afebrile.  CXR shows L>> R diffuse pulm edema.  Asked to see for admission.   Patient denies any assoc chest pain fevers main c/o is SOB and orthopnea some slight coughing no chills or purulent sputum production +ankle edema.  Patient's family is here as well.  They state patient has DNR order at home.  I confirmed with patient he does not want heroic measures/ CPR/ life support / intubation; in case of deterioration he wants to "go in peace".    Got IV lasix 80 mg in ED and minimal UOP 30 min later, bladder scan showed 100cc.  IV NTG started 5 ug and is now up to 25 ug /min.      Chart: - April 7- 12 , 2019 > admit for coughing/ SOB, ^Wob.  CXR showed RUL infiltrate pt admitted w dx of CAP treated with IV abx , rec'd IVF's and developed pulm edema treated with IV lasix with improvement.  afib with v-paced rhythm and HTN rx'd with ARB and coreg.  Delirium resolved with rx of pNA and CHF.     ROS  denies CP  no joint pain   no HA  no blurry vision  no rash  no diarrhea  no nausea/ vomiting  no dysuria  no difficulty voiding  no change in urine color    Past Medical History  Past Medical History:  Diagnosis Date  . ANXIETY 04/26/2008  . Atrial fibrillation ([place]) 10/31/2010   Coumadin therapy;  Echo 6/12: EF 50-55%, moderate MR, moderate TR, PASP 52, mild LAE  . BPH (benign prostatic hypertrophy)   . CAP (community acquired pneumonia) 12/2017  . CHF (congestive heart failure) ([place])   .  Essential hypertension, benign 10/03/2007  . HTN (hypertension) 06/07/2013  . HYPOTHYROIDISM 09/04/2007  . LYMPHADENOPATHY 04/06/2009  . Nocturia   . Pacemaker    Status post AV nodal ablation  . TOBACCO USE, QUIT 07/14/2009   Past Surgical History  Past Surgical History:  Procedure Laterality Date  . COLONOSCOPY    . EYE SURGERY  2000   eye muscle release  . EYE SURGERY     both cataracts  . HERNIA REPAIR    . INGUINAL HERNIA REPAIR Right 05/06/2013   Procedure: HERNIA REPAIR INGUINAL ADULT;  Surgeon: Haywood Lasso, MD;  Location: [facility];  Service: General;  Laterality: Right;  . PACEMAKER INSERTION  10/16/05  . PPM GENERATOR CHANGEOUT N/A 10/31/2016   Procedure: PPM Generator Changeout;  Surgeon: Deboraha Sprang, MD;  Location: [facility];  Service: Cardiovascular;  Laterality: N/A;   Family History  Family History  Problem Relation Age of Onset  . Hypertension Mother   . Cancer Mother        uterine/cervical  . Heart disease Father   . Hypertension Other    Social History  reports that he quit smoking about 38 years ago. His smoking use included cigarettes. He has a 30.00  pack-year smoking history. He has never used smokeless tobacco. He reports that he does not drink alcohol or use drugs. Allergies  Allergies  Allergen Reactions  . Bee Venom Anaphylaxis  . Amiodarone Hcl     Weakness in muscles   . Atorvastatin     unknown  . Benazepril Hcl     unknown  . Clindamycin/Lincomycin Other (See Comments)    Abdominal discomfort   . Clonidine Hydrochloride     unknown  . Colesevelam     unknown  . Diltiazem Hcl     unknown  . Ezetimibe     Unknown   . Verapamil Other (See Comments)    unknown  . Amoxicillin Rash    Has patient had a PCN reaction causing immediate rash, facial/tongue/throat swelling, SOB or lightheadedness with hypotension: Yes Has patient had a PCN reaction causing severe rash involving mucus membranes or skin necrosis:  No Has patient had a PCN reaction that required hospitalization Unknow Has patient had a PCN reaction occurring within the last 10 years: Unknown If all of the above answers are "NO", then may proceed with Cephalosporin use.    Home medications Prior to Admission medications   Medication Sig Start Date End Date Taking? Authorizing Provider  apixaban (ELIQUIS) 5 MG TABS tablet Take 1 tablet (5 mg total) by mouth 2 (two) times daily. 12/30/13   Plotnikov, Evie Lacks, MD  Ascorbic Acid (VITAMIN C PO) Take 1 tablet by mouth daily.    [provider]  Carboxymethylcellul-Glycerin (LUBRICATING EYE DROPS OP) Apply 1 drop to eye 5 (five) times daily.    [provider]  carvedilol (COREG) 25 MG tablet Take 1 tablet (25 mg total) by mouth 2 (two) times daily. 12/15/13 01/28/18  Deboraha Sprang, MD  Cholecalciferol 1000 UNITS tablet Take 1,000 Units by mouth daily. Reported on 12/23/2015    [provider]  diphenhydrAMINE (BENADRYL) 25 mg capsule Take 1-2 capsules (25-50 mg total) by mouth every 4 (four) hours as needed for itching or allergies (1-2 for bee stings). 12/23/14 01/28/18  Plotnikov, Evie Lacks, MD  EPINEPHrine (EPIPEN 2-PAK) 0.3 mg/0.3 mL DEVI Inject 0.3 mg into the muscle daily as needed (allergic reaction). For allergic reaction    [provider]  irbesartan (AVAPRO) 300 MG tablet TAKE ONE TABLET BY MOUTH AT BEDTIME 02/22/17   Plotnikov, Evie Lacks, MD  levothyroxine (SYNTHROID, LEVOTHROID) 75 MCG tablet Take 75 mcg by mouth daily.    [provider]  potassium chloride (KLOR-CON) 8 MEQ tablet Take 1 tablet (8 mEq total) by mouth 2 (two) times daily. 01/01/18 01/01/19  Plotnikov, Evie Lacks, MD  torsemide (DEMADEX) 20 MG tablet Take 2 tablets (40 mg total) by mouth daily. 01/01/18   Plotnikov, Evie Lacks, MD  umeclidinium-vilanterol (ANORO ELLIPTA) 62.5-25 MCG/INH AEPB Inhale 1 puff into the lungs daily. 01/01/18   Plotnikov, Evie Lacks, MD   Liver Function  Tests Recent Labs  Lab 02/09/18 0841  AST 23  ALT 14*  ALKPHOS 80  BILITOT 0.9  PROT 7.2  ALBUMIN 2.9*   No results for input(s): LIPASE, AMYLASE in the last 168 hours. CBC Recent Labs  Lab 02/09/18 0841  WBC 11.2*  NEUTROABS 7.6  HGB 13.7  HCT 42.4  MCV 104.2*  PLT 970*   Basic Metabolic Panel Recent Labs  Lab 02/09/18 0841  NA 139  K 4.5  CL 95*  CO2 34*  GLUCOSE 132*  BUN 23*  CREATININE 1.10  CALCIUM 8.9  Vitals:   02/09/18 1015 02/09/18 1022 02/09/18 1030 02/09/18 1045  BP: (!) 184/97 (!) 176/103 (!) 180/91 (!) 179/100  Pulse: 65 66 (!) 59 64  Resp: (!) 39 (!) 46 (!) 39 (!) 27  Temp:      TempSrc:      SpO2: 98% 98% 96% 97%   Exam: Gen eldelry pale wm , sig ^wob , FM O2, not severe distress, able to speak in 1/2 sentences at a time No rash, cyanosis or gangrene Sclera anicteric, throat not seen No jvd or bruits Chest very poor air movement throughout post lung fields, no wheezing, +sig kyphosis, anterior bilat crackles and some bibasilar post crackles RRR no MRG Abd soft ntnd no mass or ascites +bs GU normal male w condom cath MS no joint effusions or deformity Ext 1-2+ bilat pretib edema / no wounds or ulcers Neuro is alert, Ox 3 , nf    Home meds: -eliquis 5 bid/ coreg 25 bid/ avapro 300 qd/ demadex 40 qd -vitamins/ synthroid/ kdur/ anoro ellipta  Na 139 K 4.5 CO2 34  BUN 23  Cr 1.10 ABG 7.38/ 61/ 38 venous gas  EKG (independ reviewed) >  Sinus rhythm (? Paced rhythm) Atrial premature complexes Left bundle branch block No significant change since last tracing  CXR (independ reviewed): diffuse L > R pulm edema   Assessment: 1  Dyspnea/ resp distress - marked CHF on CXR and not moving air well also, question COPD but doesn't carry this diagnosis.  Pt is DNR this was confirmed this am with pt and family.  He declined bipap but this is still an option.  Prognosis is guarded and have discussed this at length with family.  Plan admit,  IV lasix, IV NTG to get SBP < 150, cardiology consult, fluid restriction. No further IV abx doubt infectious issue.  Will give nebs given poor air movement hx smoking in the past.  2  HTN - cont coreg and avapro for now 3  Hypothyroid - cont T4 4  Atrial fib/ PPM - ventricular paced rhythm , cont coreg and eliquis 5  Recent PNA     Plan - as above       [place] D [facility] Pager [phone]   If 7PM-7AM, please contact night-coverage [email or web address] Password [facility] 02/09/2018, 11:04 AM

## 2018-02-09 NOTE — Progress Notes (Signed)
02/09/2018 1600 Received pt to room 4E-16 from ED.  Pt is A&Ox4, no C/O voiced.  ADM with SOB, pt states he is breathing much better now, on 6L02 per [facility] sats are 100%.  Tele monitor applied and CCMD notified.  CHG bath given.  Oriented to room, call light and bed.  Call bell in reach, family at bedside. [place]

## 2018-02-09 NOTE — ED Notes (Signed)
Patient's blood pressure is within goal range for nitroglycerin infusion. No need to titrate infusion at this time per order. Will continue to monitor.

## 2018-02-09 NOTE — ED Notes (Signed)
Admitting at bedside speaking with patient and family

## 2018-02-09 NOTE — Progress Notes (Signed)
ANTIBIOTIC CONSULT NOTE - INITIAL  Pharmacy Consult for Vanco/Aztreonam Indication: pneumonia  Allergies  Allergen Reactions  . Bee Venom Anaphylaxis  . Amiodarone Hcl     Weakness in muscles   . Atorvastatin     unknown  . Benazepril Hcl     unknown  . Clindamycin/Lincomycin Other (See Comments)    Abdominal discomfort   . Clonidine Hydrochloride     unknown  . Colesevelam     unknown  . Diltiazem Hcl     unknown  . Ezetimibe     Unknown   . Verapamil Other (See Comments)    unknown  . Amoxicillin Rash    Has patient had a PCN reaction causing immediate rash, facial/tongue/throat swelling, SOB or lightheadedness with hypotension: Yes Has patient had a PCN reaction causing severe rash involving mucus membranes or skin necrosis: No Has patient had a PCN reaction that required hospitalization Unknow Has patient had a PCN reaction occurring within the last 10 years: Unknown If all of the above answers are "NO", then may proceed with Cephalosporin use.     Patient Measurements:   Adjusted Body Weight:    Vital Signs: Temp: 97.7 F (36.5 C) (05/26 0715) Temp Source: Oral (05/26 0715) BP: 184/113 (05/26 0815) Pulse Rate: 66 (05/26 0815) Intake/Output from previous day: No intake/output data recorded. Intake/Output from this shift: No intake/output data recorded.  Labs: No results for input(s): WBC, HGB, PLT, LABCREA, CREATININE in the last 72 hours. Estimated Creatinine Clearance: 51.5 mL/min (by C-G formula based on SCr of 0.91 mg/dL). No results for input(s): VANCOTROUGH, VANCOPEAK, VANCORANDOM, GENTTROUGH, GENTPEAK, GENTRANDOM, TOBRATROUGH, TOBRAPEAK, TOBRARND, AMIKACINPEAK, AMIKACINTROU, AMIKACIN in the last 72 hours.   Microbiology: No results found for this or any previous visit (from the past 720 hour(s)).  Medical History: Past Medical History:  Diagnosis Date  . ANXIETY 04/26/2008  . Atrial fibrillation ([place]) 10/31/2010   Coumadin therapy;  Echo 6/12:  EF 50-55%, moderate MR, moderate TR, PASP 52, mild LAE  . BPH (benign prostatic hypertrophy)   . CAP (community acquired pneumonia) 12/2017  . CHF (congestive heart failure) ([place])   . Essential hypertension, benign 10/03/2007  . HTN (hypertension) 06/07/2013  . HYPOTHYROIDISM 09/04/2007  . LYMPHADENOPATHY 04/06/2009  . Nocturia   . Pacemaker    Status post AV nodal ablation  . TOBACCO USE, QUIT 07/14/2009    Assessment: CC/HPI: SOB, confusion, CXR with LUL PNA and CHF  PMH: HF, PNA (4/19), anxiety, Afib, BPH, HTN, hypothyroid, lymphadenopathy, nocturia, PPM, ataxia, viral warts, male gynecomastia, skin neoplasm, actinic keratoses,   ID: PNA, WBC 9.6  Goal of Therapy:  Vancomycin trough level 15-20 mcg/ml  Plan:  Aztreonam 2g IV x 1 in ED then 1g IV q 8 hrs Vanco 1g IV x 1 in ED then 750mg  IV q 12h hrs. Trough after 3-5 doses at steady state.  Ginnie Marich S. Alford Highland, PharmD, BCPS Clinical Staff Pharmacist Pager [phone]  Eilene Ghazi [place] 02/09/2018,8:44 AM
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
## 2018-02-09 NOTE — ED Notes (Signed)
Per hospitalist Dr. Jonnie Finner, place foley catheter due to 245mL volume on bladder scan and 133mL output since last dose of 80mg  of lasix.

## 2018-02-09 NOTE — ED Notes (Addendum)
Hold nitorglycerin drip per Dr. Jonnie Finner. This RN will d/c drip and monitor blood pressures. New orders for bladder scan.

## 2018-02-10 ENCOUNTER — Other Ambulatory Visit: Payer: Self-pay

## 2018-02-10 LAB — CBC
HEMATOCRIT: 34.1 % — AB (ref 39.0–52.0)
Hemoglobin: 11.1 g/dL — ABNORMAL LOW (ref 13.0–17.0)
MCH: 33.4 pg (ref 26.0–34.0)
MCHC: 32.6 g/dL (ref 30.0–36.0)
MCV: 102.7 fL — ABNORMAL HIGH (ref 78.0–100.0)
Platelets: 357 10*3/uL (ref 150–400)
RBC: 3.32 MIL/uL — ABNORMAL LOW (ref 4.22–5.81)
RDW: 13.2 % (ref 11.5–15.5)
WBC: 7 10*3/uL (ref 4.0–10.5)

## 2018-02-10 LAB — BASIC METABOLIC PANEL
Anion gap: 10 (ref 5–15)
BUN: 26 mg/dL — AB (ref 6–20)
CO2: 30 mmol/L (ref 22–32)
Calcium: 8.1 mg/dL — ABNORMAL LOW (ref 8.9–10.3)
Chloride: 100 mmol/L — ABNORMAL LOW (ref 101–111)
Creatinine, Ser: 0.88 mg/dL (ref 0.61–1.24)
GFR calc Af Amer: >60 mL/min (ref >60)
GFR calc non Af Amer: >60 mL/min (ref >60)
GLUCOSE: 132 mg/dL — AB (ref 65–99)
POTASSIUM: 3.7 mmol/L (ref 3.5–5.1)
SODIUM: 140 mmol/L (ref 135–145)

## 2018-02-10 MED ORDER — CARVEDILOL 12.5 MG PO TABS
12.5000 mg | ORAL_TABLET | Freq: Two times a day (BID) | ORAL | Status: DC
  Administered 2018-02-10 – 2018-02-13 (×6): 12.5 mg via ORAL
  Filled 2018-02-10 (×6): qty 1

## 2018-02-10 NOTE — Progress Notes (Signed)
Progress Note  Patient Name: Ricky [name] Date of Encounter: 02/10/2018  Primary Cardiologist: Jolyn Nap  Subjective   Breathing is much better this am. No dyspnea or chest pain.   Inpatient Medications    Scheduled Meds: . apixaban  5 mg Oral BID  . carvedilol  25 mg Oral BID  . furosemide  80 mg Intravenous Q8H  . irbesartan  300 mg Oral QHS  . levothyroxine  75 mcg Oral QAC breakfast  . methylPREDNISolone (SOLU-MEDROL) injection  60 mg Intravenous Q12H  . sodium chloride flush  3 mL Intravenous Q12H   Continuous Infusions: . sodium chloride    . levofloxacin (LEVAQUIN) IV    . nitroGLYCERIN Stopped (02/09/18 1237)   PRN Meds: sodium chloride, acetaminophen **OR** acetaminophen, albuterol, bisacodyl, ondansetron **OR** ondansetron (ZOFRAN) IV, sodium chloride flush   Vital Signs    Vitals:   02/09/18 2018 02/09/18 2112 02/10/18 0106 02/10/18 0629  BP: 115/66  (!) 105/51 (!) 120/57  Pulse: 65  67 71  Resp: (!) 22  14 (!) 23  Temp: (!) 97.3 F (36.3 C)   97.9 F (36.6 C)  TempSrc: Oral  Oral Oral  SpO2: 97% 96% 93% 91%  Weight:    143 lb (64.9 kg)  Height:        Intake/Output Summary (Last 24 hours) at 02/10/2018 0806 Last data filed at 02/10/2018 7564 Gross per 24 hour  Intake 16.35 ml  Output 2075 ml  Net -2058.65 ml   Filed Weights   02/09/18 1603 02/10/18 0629  Weight: 149 lb 4 oz (67.7 kg) 143 lb (64.9 kg)    Telemetry    Paced - Personally Reviewed  ECG    No AM EKG - Personally Reviewed  Physical Exam   GEN: No acute distress.   Neck: No JVD Cardiac: RRR, no murmurs, rubs, or gallops.  Respiratory: Clear to auscultation bilaterally. GI: Soft, nontender, non-distended  MS: No edema; No deformity. Neuro:  Nonfocal  Psych: Normal affect   Labs    Chemistry Recent Labs  Lab 02/09/18 0841 02/10/18 0306  NA 139 140  K 4.5 3.7  CL 95* 100*  CO2 34* 30  GLUCOSE 132* 132*  BUN 23* 26*  CREATININE 1.10 0.88  CALCIUM 8.9  8.1*  PROT 7.2  --   ALBUMIN 2.9*  --   AST 23  --   ALT 14*  --   ALKPHOS 80  --   BILITOT 0.9  --   GFRNONAA 56* >60  GFRAA >60 >60  ANIONGAP 10 10     Hematology Recent Labs  Lab 02/09/18 0841 02/10/18 0306  WBC 11.2* 7.0  RBC 4.07* 3.32*  HGB 13.7 11.1*  HCT 42.4 34.1*  MCV 104.2* 102.7*  MCH 33.7 33.4  MCHC 32.3 32.6  RDW 13.5 13.2  PLT 464* 357    Cardiac EnzymesNo results for input(s): TROPONINI in the last 168 hours.  Recent Labs  Lab 02/09/18 0900  TROPIPOC 0.03     BNP Recent Labs  Lab 02/09/18 0841  BNP 436.9*     DDimer No results for input(s): DDIMER in the last 168 hours.   Radiology    Dg Chest Port 1 View  Result Date: 02/09/2018 CLINICAL DATA:  Shortness of breath. EXAM: PORTABLE CHEST 1 VIEW COMPARISON:  01/30/2018 FINDINGS: Right chest wall pacer device noted with lead in the right atrial appendage and right ventricle. The heart size appears within normal limits. Aortic atherosclerotic calcifications identified. Bilateral  pleural effusions and pulmonary edema identified compatible with CHF. Progressive airspace disease within the left upper lobe is identified compatible with pneumonia. IMPRESSION: 1. Left upper lobe pneumonia. 2. Congestive heart failure 3.  Aortic Atherosclerosis (ICD10-I70.0). Electronically Signed   By: Kerby Moors M.D.   On: 02/09/2018 08:12    Cardiac Studies    Patient Profile     84 y.o. male with history of chronic persistent atrial fibrillation s/p AV node ablation with PPM placement, HTN, chronic diastolic CHF admitted with progressive dyspnea and hypoxic respiratory failure. Chest x-ray in the ER shows congestive heart failure with progressive left upper lobe airspace disease as well as bilateral pleural effusions.  BNP level 436. Last echocardiogram from 2014 indicated LVEF 55 to 60% range with mild to moderate left atrial enlargement, PASP 40 mmHg.  Assessment & Plan    1. Acute on chronic diastolic CHF:  He is diuresing well with IV Lasix. His volume status looks pretty good to me this am. He is negative 2 liters and weight is down 6 lbs over last 24 hours. I would give one dose of IV Lasix this am and then change to po Lasix. I will arrange an echo today. I would plan ambulation today.   For questions or updates, please contact [place] Please consult [email or web address] for contact info under Cardiology/STEMI.      Signed, Lauree Chandler, MD  02/10/2018, 8:06 AM

## 2018-02-10 NOTE — Progress Notes (Signed)
Patient up to chair x1 this am. Chlora Mcbain, Bettina Gavia RN

## 2018-02-10 NOTE — Plan of Care (Signed)
Initiated and progressing. 

## 2018-02-10 NOTE — Progress Notes (Signed)
[facility] TEAM 1 - Stepdown/ICU TEAM  DJ SENTENO  YIF:[id number] DOB: 05/17/1923 DOA: 02/09/2018 PCP: Cassandria Anger, MD    Brief Narrative:  84 y.o. male with hx of AV node ablation s/p pacer, remote tobacco abuse, HTN, CHF, BPH, and atrial fibrillation who presented to the ED with SOB.  He recently saw his Cardiologist and diuretics were increased due to suspicion of CHF.  In the ED a CXR noted L> R diffuse pulm edema.    Significant Events: 5/26 admit 5/27 TTE  Subjective: The patient is resting comfortably in a bedside chair.  He denies shortness of breath chest pain nausea or vomiting.  He reports that he still feels there is a bit of excess swelling in his right foot but otherwise states he has improved significantly since his admission.  He is motivated to get up and get moving.  Assessment & Plan:  Acute on chronic diastolic CHF - Pulmonary edema  Diuresing nicely - wgt declining - resp status much improved - TTE pending - continue current diuretic regimen and fine tunr medical therapy when results of echo are available  Filed Weights   02/09/18 1603 02/10/18 0629  Weight: 67.7 kg (149 lb 4 oz) 64.9 kg (143 lb)   Chronic Afib  Heart rate well controlled - continue Eliquis  Macrocytic anemia  Check anemia panel   BPH  HTN Blood pressure currently well controlled  Hypothyroid Continue home medical therapy  DVT prophylaxis: eliquis  Code Status: DNR - NO CODE Family Communication: spoke with son at bedside Disposition Plan: transfer to telemetry bed - begin PT/OT  Consultants:  Cardiology  Antimicrobials:  none   Objective: Blood pressure (!) 91/45, pulse 64, temperature 97.9 F (36.6 C), temperature source Oral, resp. rate (!) 27, height 5\' 10"  (1.778 m), weight 64.9 kg (143 lb), SpO2 92 %.  Intake/Output Summary (Last 24 hours) at 02/10/2018 1537 Last data filed at 02/10/2018 0959 Gross per 24 hour  Intake 256.35 ml  Output 1475 ml  Net  -1218.65 ml   Filed Weights   02/09/18 1603 02/10/18 0629  Weight: 67.7 kg (149 lb 4 oz) 64.9 kg (143 lb)    Examination: General: No acute respiratory distress Lungs: Clear to auscultation bilaterally without wheezes or crackles Cardiovascular: Regular rate and rhythm without murmur gallop or rub normal S1 and S2 Abdomen: Nontender, nondistended, soft, bowel sounds positive, no rebound, no ascites, no appreciable mass Extremities: 1+ pedal edema B LE   CBC: Recent Labs  Lab 02/09/18 0841 02/10/18 0306  WBC 11.2* 7.0  NEUTROABS 7.6  --   HGB 13.7 11.1*  HCT 42.4 34.1*  MCV 104.2* 102.7*  PLT 464* 867   Basic Metabolic Panel: Recent Labs  Lab 02/09/18 0841 02/10/18 0306  NA 139 140  K 4.5 3.7  CL 95* 100*  CO2 34* 30  GLUCOSE 132* 132*  BUN 23* 26*  CREATININE 1.10 0.88  CALCIUM 8.9 8.1*   GFR: Estimated Creatinine Clearance: 49.2 mL/min (by C-G formula based on SCr of 0.88 mg/dL).  Liver Function Tests: Recent Labs  Lab 02/09/18 0841  AST 23  ALT 14*  ALKPHOS 80  BILITOT 0.9  PROT 7.2  ALBUMIN 2.9*    Recent Results (from the past 240 hour(s))  Blood Culture (routine x 2)     Status: None (Preliminary result)   Collection Time: 02/09/18  8:24 AM  Result Value Ref Range Status   Specimen Description BLOOD LEFT ANTECUBITAL  Final  Special Requests   Final    BOTTLES DRAWN AEROBIC AND ANAEROBIC Blood Culture results may not be optimal due to an inadequate volume of blood received in culture bottles   Culture   Final    NO GROWTH 1 DAY Performed at [facility] [address] [place], [place] [address]    Report Status PENDING  Incomplete  Culture, expectorated sputum-assessment     Status: None   Collection Time: 02/09/18  3:50 PM  Result Value Ref Range Status   Specimen Description EXPECTORATED SPUTUM  Final   Special Requests NONE  Final   Sputum evaluation   Final    THIS SPECIMEN IS ACCEPTABLE FOR SPUTUM CULTURE Performed at [facility], [place] [address] [place], [place] [address]    Report Status 02/09/2018 FINAL  Final  Culture, respiratory (NON-Expectorated)     Status: None (Preliminary result)   Collection Time: 02/09/18  3:50 PM  Result Value Ref Range Status   Specimen Description EXPECTORATED SPUTUM  Final   Special Requests NONE Reflexed from [id number]  Final   Gram Stain   Final    MODERATE WBC PRESENT, PREDOMINANTLY PMN FEW SQUAMOUS EPITHELIAL CELLS PRESENT ABUNDANT GRAM POSITIVE COCCI IN CHAINS FEW GRAM POSITIVE COCCI IN CLUSTERS    Culture   Final    TOO YOUNG TO READ Performed at [facility], [place] [address] [place], [place] [address]    Report Status PENDING  Incomplete  Blood Culture (routine x 2)     Status: None (Preliminary result)   Collection Time: 02/09/18  4:29 PM  Result Value Ref Range Status   Specimen Description BLOOD LEFT ANTECUBITAL  Final   Special Requests   Final    BOTTLES DRAWN AEROBIC ONLY Blood Culture adequate volume   Culture   Final    NO GROWTH < 24 HOURS Performed at [facility], [place] [address], [place], [place] [address]    Report Status PENDING  Incomplete     Scheduled Meds: . apixaban  5 mg Oral BID  . carvedilol  25 mg Oral BID  . furosemide  80 mg Intravenous Q8H  . irbesartan  300 mg Oral QHS  . levothyroxine  75 mcg Oral QAC breakfast  . methylPREDNISolone (SOLU-MEDROL) injection  60 mg Intravenous Q12H  . sodium chloride flush  3 mL Intravenous Q12H     LOS: 1 day   Cherene Altes, MD [facility] Office  [phone] Pager - Text Page per Shea Evans as per below:  On-Call/Text Page:      [email or web address]      password [email or web address]  If 7PM-7AM, please contact night-coverage [email or web address] Password [email or web address] 02/10/2018, 3:37 PM

## 2018-02-11 ENCOUNTER — Inpatient Hospital Stay (HOSPITAL_COMMUNITY): Payer: Medicare HMO

## 2018-02-11 ENCOUNTER — Encounter (HOSPITAL_COMMUNITY): Payer: Self-pay | Admitting: Cardiology

## 2018-02-11 ENCOUNTER — Other Ambulatory Visit (HOSPITAL_COMMUNITY): Payer: Medicare HMO

## 2018-02-11 DIAGNOSIS — J9600 Acute respiratory failure, unspecified whether with hypoxia or hypercapnia: Secondary | ICD-10-CM

## 2018-02-11 DIAGNOSIS — Z950 Presence of cardiac pacemaker: Secondary | ICD-10-CM

## 2018-02-11 DIAGNOSIS — I509 Heart failure, unspecified: Secondary | ICD-10-CM

## 2018-02-11 DIAGNOSIS — I5023 Acute on chronic systolic (congestive) heart failure: Secondary | ICD-10-CM

## 2018-02-11 DIAGNOSIS — I10 Essential (primary) hypertension: Secondary | ICD-10-CM

## 2018-02-11 LAB — BASIC METABOLIC PANEL
ANION GAP: 10 (ref 5–15)
BUN: 46 mg/dL — AB (ref 6–20)
CALCIUM: 8.5 mg/dL — AB (ref 8.9–10.3)
CO2: 30 mmol/L (ref 22–32)
Chloride: 99 mmol/L — ABNORMAL LOW (ref 101–111)
Creatinine, Ser: 1.13 mg/dL (ref 0.61–1.24)
GFR calc Af Amer: >60 mL/min (ref >60)
GFR calc non Af Amer: 54 mL/min — ABNORMAL LOW (ref >60)
GLUCOSE: 131 mg/dL — AB (ref 65–99)
Potassium: 3.9 mmol/L (ref 3.5–5.1)
Sodium: 139 mmol/L (ref 135–145)

## 2018-02-11 LAB — CBC
HEMATOCRIT: 34.8 % — AB (ref 39.0–52.0)
HEMOGLOBIN: 11.3 g/dL — AB (ref 13.0–17.0)
MCH: 33.1 pg (ref 26.0–34.0)
MCHC: 32.5 g/dL (ref 30.0–36.0)
MCV: 102.1 fL — ABNORMAL HIGH (ref 78.0–100.0)
Platelets: 403 10*3/uL — ABNORMAL HIGH (ref 150–400)
RBC: 3.41 MIL/uL — ABNORMAL LOW (ref 4.22–5.81)
RDW: 13.4 % (ref 11.5–15.5)
WBC: 15.2 10*3/uL — ABNORMAL HIGH (ref 4.0–10.5)

## 2018-02-11 LAB — FERRITIN: Ferritin: 369 ng/mL — ABNORMAL HIGH (ref 24–336)

## 2018-02-11 LAB — IRON AND TIBC
Iron: 57 ug/dL (ref 45–182)
SATURATION RATIOS: 34 % (ref 17.9–39.5)
TIBC: 169 ug/dL — ABNORMAL LOW (ref 250–450)
UIBC: 112 ug/dL

## 2018-02-11 LAB — RETICULOCYTES
RBC.: 3.41 MIL/uL — ABNORMAL LOW (ref 4.22–5.81)
RETIC CT PCT: 2.3 % (ref 0.4–3.1)
Retic Count, Absolute: 78.4 10*3/uL (ref 19.0–186.0)

## 2018-02-11 LAB — VITAMIN B12: Vitamin B-12: 1115 pg/mL — ABNORMAL HIGH (ref 180–914)

## 2018-02-11 LAB — FOLATE: Folate: 21.5 ng/mL (ref >5.9)

## 2018-02-11 MED ORDER — LEVOTHYROXINE SODIUM 75 MCG PO TABS
75.0000 ug | ORAL_TABLET | Freq: Every day | ORAL | Status: DC
Start: 2018-02-12 — End: 2018-02-13
  Administered 2018-02-12 – 2018-02-13 (×2): 75 ug via ORAL
  Filled 2018-02-11 (×2): qty 1

## 2018-02-11 NOTE — Evaluation (Signed)
Occupational Therapy Evaluation Patient Details Name: Ricky [name] MRN: [id number] DOB: 10-Oct-1922 Today's Date: 02/11/2018    History of Present Illness Pt adm with acute on chronic heart failure. PMH - HTN, chf, pna, afib, pacer   Clinical Impression   PTA, pt was living with his daughter since a recent admission to hospital and required assistance for bathing; PCA coming twice a week to assist with bathing. Pt currently requiring supervision for grooming in standing, set up-Min A for UB ADLs, and Min Guard A for LB ADLs. Pt presenting with generalized weakness and decreased balance. Pt would benefit from further acute OT to facilitate safe dc. Recommend dc to home and resume HHOT for further OT to optimize safety, independence with ADLs, and return to PLOF.      Follow Up Recommendations  Home health OT;Supervision/Assistance - 24 hour(Continue HHOT that was set up PTA)    Equipment Recommendations  None recommended by OT    Recommendations for Other Services       Precautions / Restrictions Precautions Precautions: Fall Restrictions Weight Bearing Restrictions: No      Mobility Bed Mobility               General bed mobility comments: Pt up in chair  Transfers Overall transfer level: Needs assistance Equipment used: Rolling walker (2 wheeled) Transfers: Sit to/from Stand Sit to Stand: Supervision         General transfer comment: supervision for safety. Repeated sit to stand x 7 for strengthening    Balance Overall balance assessment: Needs assistance Sitting-balance support: No upper extremity supported;Feet supported Sitting balance-Leahy Scale: Good     Standing balance support: No upper extremity supported;During functional activity Standing balance-Leahy Scale: Fair                             ADL either performed or assessed with clinical judgement   ADL Overall ADL's : Needs assistance/impaired Eating/Feeding: Set up;Sitting   Grooming: Set up;Supervision/safety;Standing;Oral care;Wash/dry face;Wash/dry hands Grooming Details (indicate cue type and reason): Pt performing grooming at sink with supervision for safety. Upper Body Bathing: Minimal assistance;Sitting   Lower Body Bathing: Min guard;Sit to/from stand   Upper Body Dressing : Set up;Supervision/safety;Sitting   Lower Body Dressing: Min guard;Sit to/from stand Lower Body Dressing Details (indicate cue type and reason): Pt able to don/doff socks by bringing ankles to knees. Min Guard A for safety with dynamic standing Toilet Transfer: Supervision/safety;Ambulation;RW Toilet Transfer Details (indicate cue type and reason): Pt performing simulated toilet transfer with supervision for safety         Functional mobility during ADLs: Min guard;Rolling walker General ADL Comments: Pt demonstrating decreased balance as seen during functional mobility.     Vision Baseline Vision/History: Wears glasses Wears Glasses: Reading only Patient Visual Report: No change from baseline       Perception     Praxis      Pertinent Vitals/Pain Pain Assessment: No/denies pain     Hand Dominance Right   Extremity/Trunk Assessment Upper Extremity Assessment Upper Extremity Assessment: Generalized weakness   Lower Extremity Assessment Lower Extremity Assessment: Defer to PT evaluation   Cervical / Trunk Assessment Cervical / Trunk Assessment: Kyphotic   Communication Communication Communication: No difficulties   Cognition Arousal/Alertness: Awake/alert Behavior During Therapy: WFL for tasks assessed/performed Overall Cognitive Status: Within Functional Limits for tasks assessed  General Comments  Daughter present throughout session    Exercises     Shoulder [place] expects to be discharged to:: Private residence Living Arrangements: Children Available Help  at Discharge: Family;Available 24 hours/day Type of Home: House Home Access: Stairs to enter [employer] of Steps: 4 Entrance Stairs-Rails: Right;Left Home Layout: Two level;Able to live on main level with bedroom/bathroom     Bathroom Shower/Tub: Teacher, early years/pre: Standard Bathroom Accessibility: Yes   Home Equipment: Walker - 2 wheels;Cane - single point;Bedside commode   Additional Comments: Recently staying at his daughter's home.      Prior Functioning/Environment Level of Independence: Needs assistance  Gait / Transfers Assistance Needed: amb modified independent with walker ADL's / Homemaking Assistance Needed: NT coming to home twice a week for bathing. Does majority of dressing            OT Problem List: Decreased activity tolerance;Impaired balance (sitting and/or standing);Cardiopulmonary status limiting activity      OT Treatment/Interventions: Self-care/ADL training;Therapeutic exercise;Energy conservation;DME and/or AE instruction;Therapeutic activities;Patient/family education    OT Goals(Current goals can be found in the care plan section) Acute Rehab OT Goals Patient Stated Goal: return home OT Goal Formulation: With patient Time For Goal Achievement: 02/25/18 Potential to Achieve Goals: Good ADL Goals Pt Will Perform Grooming: with modified independence;standing Pt Will Perform Upper Body Dressing: with modified independence;sitting Pt Will Perform Lower Body Dressing: with modified independence;sit to/from stand Pt Will Transfer to Toilet: with modified independence;ambulating;bedside commode  OT Frequency: Min 2X/week   Barriers to D/C:            Co-evaluation              AM-PAC PT "6 Clicks" Daily Activity     Outcome Measure Help from another person eating meals?: None Help from another person taking care of personal grooming?: A Little Help from another person toileting, which includes using toliet,  bedpan, or urinal?: A Little Help from another person bathing (including washing, rinsing, drying)?: A Little Help from another person to put on and taking off regular upper body clothing?: A Little Help from another person to put on and taking off regular lower body clothing?: A Little 6 Click Score: 19   End of Session Equipment Utilized During Treatment: Gait belt;Rolling walker Nurse Communication: Mobility status  Activity Tolerance: Patient tolerated treatment well Patient left: in chair;with call bell/phone within reach;with family/visitor present  OT Visit Diagnosis: Unsteadiness on feet (R26.81);Other abnormalities of gait and mobility (R26.89);Muscle weakness (generalized) (M62.81)                Time: 6269-4854 OT Time Calculation (min): 23 min Charges:  OT General Charges $OT Visit: 1 Visit OT Evaluation $OT Eval Moderate Complexity: 1 Mod OT Treatments $Self Care/Home Management : 8-22 mins G-Codes:     Yizel Canby MSOT, OTR/L Acute Rehab Pager: [phone] Office: [place] 02/11/2018, 3:29 PM

## 2018-02-11 NOTE — Progress Notes (Signed)
Progress Note  Patient Name: Ricky [name] Date of Encounter: 02/11/2018  Primary Cardiologist: Jolyn Nap  Subjective   Breathing is much better this am. No dyspnea or chest pain.   Inpatient Medications    Scheduled Meds: . apixaban  5 mg Oral BID  . carvedilol  12.5 mg Oral BID  . furosemide  80 mg Intravenous Q8H  . irbesartan  300 mg Oral QHS  . levothyroxine  75 mcg Oral QAC breakfast   Continuous Infusions:  PRN Meds: acetaminophen **OR** acetaminophen, albuterol, bisacodyl, ondansetron **OR** ondansetron (ZOFRAN) IV   Vital Signs    Vitals:   02/10/18 1804 02/10/18 1951 02/11/18 0511 02/11/18 0800  BP: (!) 100/52 137/67 135/65   Pulse: 65 65 65   Resp: (!) 26 17 19  (!) 21  Temp: 98.3 F (36.8 C) 97.7 F (36.5 C) 97.9 F (36.6 C)   TempSrc: Oral Oral Oral   SpO2: 92% 94% 92%   Weight:   151 lb 7.3 oz (68.7 kg)   Height:        Intake/Output Summary (Last 24 hours) at 02/11/2018 0954 Last data filed at 02/11/2018 0700 Gross per 24 hour  Intake 480 ml  Output 650 ml  Net -170 ml   Filed Weights   02/09/18 1603 02/10/18 0629 02/11/18 0511  Weight: 149 lb 4 oz (67.7 kg) 143 lb (64.9 kg) 151 lb 7.3 oz (68.7 kg)   Telemetry    Paced - Personally Reviewed  ECG    No AM EKG - Personally Reviewed  Physical Exam   GEN: No acute distress.   Neck: No JVD Cardiac: RRR, no murmurs, rubs, or gallops.  Respiratory: Clear to auscultation bilaterally. GI: Soft, nontender, non-distended  MS: No edema; No deformity. Neuro:  Nonfocal  Psych: Normal affect   Labs    Chemistry Recent Labs  Lab 02/09/18 0841 02/10/18 0306 02/11/18 0632  NA 139 140 139  K 4.5 3.7 3.9  CL 95* 100* 99*  CO2 34* 30 30  GLUCOSE 132* 132* 131*  BUN 23* 26* 46*  CREATININE 1.10 0.88 1.13  CALCIUM 8.9 8.1* 8.5*  PROT 7.2  --   --   ALBUMIN 2.9*  --   --   AST 23  --   --   ALT 14*  --   --   ALKPHOS 80  --   --   BILITOT 0.9  --   --   GFRNONAA 56* >60 54*    GFRAA >60 >60 >60  ANIONGAP 10 10 10     Hematology Recent Labs  Lab 02/09/18 0841 02/10/18 0306 02/11/18 0632  WBC 11.2* 7.0 15.2*  RBC 4.07* 3.32* 3.41*  3.41*  HGB 13.7 11.1* 11.3*  HCT 42.4 34.1* 34.8*  MCV 104.2* 102.7* 102.1*  MCH 33.7 33.4 33.1  MCHC 32.3 32.6 32.5  RDW 13.5 13.2 13.4  PLT 464* 357 403*   Cardiac EnzymesNo results for input(s): TROPONINI in the last 168 hours.  Recent Labs  Lab 02/09/18 0900  TROPIPOC 0.03     BNP Recent Labs  Lab 02/09/18 0841  BNP 436.9*    DDimer No results for input(s): DDIMER in the last 168 hours.   Radiology    No results found.  Cardiac Studies   Echo is pending  Patient Profile     84 y.o. male with history of chronic persistent atrial fibrillation s/p AV node ablation with PPM placement, HTN, chronic diastolic CHF admitted with progressive dyspnea and  hypoxic respiratory failure. Chest x-ray in the ER shows congestive heart failure with progressive left upper lobe airspace disease as well as bilateral pleural effusions.  BNP level 436. Last echocardiogram from 2014 indicated LVEF 55 to 60% range with mild to moderate left atrial enlargement, PASP 40 mmHg.  Assessment & Plan    1. Acute on chronic diastolic CHF: He is diuresing well with IV Lasix. His volume status looks pretty good to me this am. He is negative 0.4 liters, weight is up - we will recheck.  On physical exam he has ninimal crackles on his lungs, mild residual LE edema, I have reviewed his CXR and it shows improved pulmonary edema, but its still persistent. I would continue iv Lasix for now. Crea is stable at 1.1, BUN is trending up 26--> 46.   Echo is pending.  He has been walking with PT this am and felt good.   For questions or updates, please contact [place] Please consult [email or web address] for contact info under Cardiology/STEMI.     Signed, Ena Dawley, MD  02/11/2018, 9:54 AM

## 2018-02-11 NOTE — Care Management Note (Signed)
Case Management Note  Patient Details  Name: Ricky [name] MRN: [id number] Date of Birth: Oct 28, 1922  Subjective/Objective:                 Spoke w patient's daughter at bedside while he worked w PT. He lives with her, is currently active w [facility] for PT OT RN [facility]. They would like to resume at DC. Patient has a RW. CM will continue to follow.    Action/Plan:  Will need resumption of care orders for [facility] and faxed out.   Expected Discharge Date:                  Expected Discharge Plan:  [place]  In-House Referral:     Discharge planning Services  CM Consult  Post Acute Care Choice:  Home Health, Resumption of Svcs/PTA Provider Choice offered to:  Adult Children  DME Arranged:    DME Agency:     HH Arranged:    HH Agency:     Status of Service:  In process, will continue to follow  If discussed at Long Length of Stay Meetings, dates discussed:    Additional Comments:  Carles Collet, RN 02/11/2018, 3:34 PM

## 2018-02-11 NOTE — Progress Notes (Signed)
PROGRESS NOTE    Ricky [name]  WUJ:[id number] DOB: 12-21-1922 DOA: 02/09/2018 PCP: Cassandria Anger, MD    Brief Narrative: 84 y.o.malewith hx of AV node ablation s/p pacer, remote tobacco abuse, HTN, CHF, BPH, and atrial fibrillation who presented to the ED with SOB. He recently saw his Cardiologist and diuretics were increased due to suspicion of CHF.    Assessment & Plan:   Principal Problem:   Acute CHF (congestive heart failure) (HCC) Active Problems:   Hypothyroidism (acquired)   Atrial fibrillation with RVR (HCC)   Pacemaker-[employer]   Essential hypertension   Respiratory distress, acute   DNR (do not resuscitate)   Acute respiratory failure ([place])  Acute respiratory failure with hypoxia secondary to acute on chronic diastolic heart failure:  Started on IV lasix with appropriate diuresis. He diursed about 2 lit since admission.  Echocardiogram ordered . Pt currently on IV lasix 80 mg EVERY 8 HOURS.  Pt reports his breathing has improved.  Continue with strict intake and output and daily weights.  Resume coreg.  Monitor creatinine and potassium while on lasix.    Chronic atrial fibrillation  Rate controlled. S/p pacemaker.  On eliquis.  Rate control with coreg.     Hypertension;  Well controlled.  Currently on avapro and coreg.    Hypothyroidism; Resume TSH.   Macrocytic anemia: Hemoglobin stable around 11.  Adequate vit b12 levels and folate.  Iron and ferritin are adequate.    Leukocytosis:  Unclear etiology.  He is afebrile. Does not appear to be toxic.  Probably reactive. Monitor.   DVT prophylaxis: Eliquis.  Code Status: DNR Family Communication: none at bedside.  Disposition Plan:pending resolution of CHF.    Consultants:   Cardiology.    Procedures: echocardiogram.   Antimicrobials: none.   Subjective: Breathing better, walked with PT, and reports feeling good. no chest pain or nausea, vomiting.   Objective: Vitals:     02/11/18 0900 02/11/18 0919 02/11/18 1000 02/11/18 1100  BP:  (!) 144/66    Pulse:      Resp: (!) 24 (!) 21 (!) 24 (!) 23  Temp:      TempSrc:      SpO2:  96%    Weight:      Height:        Intake/Output Summary (Last 24 hours) at 02/11/2018 1256 Last data filed at 02/11/2018 0900 Gross per 24 hour  Intake 120 ml  Output 650 ml  Net -530 ml   Filed Weights   02/09/18 1603 02/10/18 0629 02/11/18 0511  Weight: 67.7 kg (149 lb 4 oz) 64.9 kg (143 lb) 68.7 kg (151 lb 7.3 oz)    Examination:  General exam: Appears calm and comfortable not on oxygen.  Respiratory system: Clear to auscultation. Respiratory effort normal. Cardiovascular system: S1 & S2 heard, RRR. No JVD, murmurs, No pedal edema. Gastrointestinal system: Abdomen is nondistended, soft and nontender. No organomegaly or masses felt. Normal bowel sounds heard. Central nervous system: Alert and oriented. No focal neurological deficits. Extremities: Symmetric 5 x 5 power. Skin: No rashes, lesions or ulcers Psychiatry:  Mood & affect appropriate.     Data Reviewed: I have personally reviewed following labs and imaging studies  CBC: Recent Labs  Lab 02/09/18 0841 02/10/18 0306 02/11/18 0632  WBC 11.2* 7.0 15.2*  NEUTROABS 7.6  --   --   HGB 13.7 11.1* 11.3*  HCT 42.4 34.1* 34.8*  MCV 104.2* 102.7* 102.1*  PLT 464* 357 403*  Basic Metabolic Panel: Recent Labs  Lab 02/09/18 0841 02/10/18 0306 02/11/18 0632  NA 139 140 139  K 4.5 3.7 3.9  CL 95* 100* 99*  CO2 34* 30 30  GLUCOSE 132* 132* 131*  BUN 23* 26* 46*  CREATININE 1.10 0.88 1.13  CALCIUM 8.9 8.1* 8.5*   GFR: Estimated Creatinine Clearance: 40.5 mL/min (by C-G formula based on SCr of 1.13 mg/dL). Liver Function Tests: Recent Labs  Lab 02/09/18 0841  AST 23  ALT 14*  ALKPHOS 80  BILITOT 0.9  PROT 7.2  ALBUMIN 2.9*   No results for input(s): LIPASE, AMYLASE in the last 168 hours. No results for input(s): AMMONIA in the last 168  hours. Coagulation Profile: No results for input(s): INR, PROTIME in the last 168 hours. Cardiac Enzymes: No results for input(s): CKTOTAL, CKMB, CKMBINDEX, TROPONINI in the last 168 hours. BNP (last 3 results) Recent Labs    01/30/18 1104  PROBNP 501.0*   HbA1C: No results for input(s): HGBA1C in the last 72 hours. CBG: No results for input(s): GLUCAP in the last 168 hours. Lipid Profile: No results for input(s): CHOL, HDL, LDLCALC, TRIG, CHOLHDL, LDLDIRECT in the last 72 hours. Thyroid Function Tests: No results for input(s): TSH, T4TOTAL, FREET4, T3FREE, THYROIDAB in the last 72 hours. Anemia Panel: Recent Labs    02/11/18 0632  VITAMINB12 1,115*  FOLATE 21.5  FERRITIN 369*  TIBC 169*  IRON 57  RETICCTPCT 2.3   Sepsis Labs: Recent Labs  Lab 02/09/18 0923  LATICACIDVEN 1.64    Recent Results (from the past 240 hour(s))  Blood Culture (routine x 2)     Status: None (Preliminary result)   Collection Time: 02/09/18  8:24 AM  Result Value Ref Range Status   Specimen Description BLOOD LEFT ANTECUBITAL  Final   Special Requests   Final    BOTTLES DRAWN AEROBIC AND ANAEROBIC Blood Culture results may not be optimal due to an inadequate volume of blood received in culture bottles   Culture   Final    NO GROWTH 1 DAY Performed at [facility], [place] [address] [place], [place] [address]    Report Status PENDING  Incomplete  Culture, expectorated sputum-assessment     Status: None   Collection Time: 02/09/18  3:50 PM  Result Value Ref Range Status   Specimen Description EXPECTORATED SPUTUM  Final   Special Requests NONE  Final   Sputum evaluation   Final    THIS SPECIMEN IS ACCEPTABLE FOR SPUTUM CULTURE Performed at [facility], [place] [address], [place], [place] [address]    Report Status 02/09/2018 FINAL  Final  Culture, respiratory (NON-Expectorated)     Status: None (Preliminary result)   Collection Time: 02/09/18  3:50 PM  Result Value Ref Range  Status   Specimen Description EXPECTORATED SPUTUM  Final   Special Requests NONE Reflexed from [id number]  Final   Gram Stain   Final    MODERATE WBC PRESENT, PREDOMINANTLY PMN FEW SQUAMOUS EPITHELIAL CELLS PRESENT ABUNDANT GRAM POSITIVE COCCI IN CHAINS FEW GRAM POSITIVE COCCI IN CLUSTERS    Culture   Final    CULTURE REINCUBATED FOR BETTER GROWTH Performed at [facility], [place] [address], [place], [place] [address]    Report Status PENDING  Incomplete  Blood Culture (routine x 2)     Status: None (Preliminary result)   Collection Time: 02/09/18  4:29 PM  Result Value Ref Range Status   Specimen Description BLOOD LEFT  ANTECUBITAL  Final   Special Requests   Final    BOTTLES DRAWN AEROBIC ONLY Blood Culture adequate volume   Culture   Final    NO GROWTH < 24 HOURS Performed at [facility], [address], [place], [place] [address]    Report Status PENDING  Incomplete         Radiology Studies: Dg Chest Port 1 View  Result Date: 02/11/2018 CLINICAL DATA:  Shortness of breath, congestion EXAM: PORTABLE CHEST 1 VIEW COMPARISON:  02/09/2018 FINDINGS: Right pacer remains in place, unchanged. Improving airspace disease in the right lung. Continued diffuse airspace disease throughout the left lung, unchanged. Small bilateral pleural effusions. Heart is upper limits normal in size. IMPRESSION: Continued diffuse left lung airspace disease concerning for pneumonia. Improving aeration throughout the right lung with decreasing right lung airspace opacities. Small bilateral effusions. Electronically Signed   By: Rolm Baptise M.D.   On: 02/11/2018 11:13        Scheduled Meds: . apixaban  5 mg Oral BID  . carvedilol  12.5 mg Oral BID  . furosemide  80 mg Intravenous Q8H  . irbesartan  300 mg Oral QHS  . levothyroxine  75 mcg Oral QAC breakfast   Continuous Infusions:   LOS: 2 days    Time spent: 35 minutes    Hosie Poisson, MD [facility] Pager  [phone]  If 7PM-7AM, please contact night-coverage [email or web address] Password [facility] 02/11/2018, 12:56 PM

## 2018-02-11 NOTE — Plan of Care (Signed)
Pt ambulates, currently denies pain.

## 2018-02-11 NOTE — Evaluation (Signed)
Physical Therapy Evaluation Patient Details Name: Ricky [name] MRN: [id number] DOB: [birth year] Today's Date: 02/11/2018   History of Present Illness  Pt adm with acute on chronic heart failure. PMH - HTN, chf, pna, afib, pacer  Clinical Impression  Pt admitted with above diagnosis and presents to PT with functional limitations due to deficits listed below (See PT problem list). Pt needs skilled PT to maximize independence and safety to allow discharge to home with family and resume HHPT.      Follow Up Recommendations Home health PT;Supervision - Intermittent    Equipment Recommendations  None recommended by PT    Recommendations for Other Services       Precautions / Restrictions Precautions Precautions: Fall Restrictions Weight Bearing Restrictions: No      Mobility  Bed Mobility               General bed mobility comments: Pt up in chair  Transfers Overall transfer level: Needs assistance Equipment used: Rolling walker (2 wheeled) Transfers: Sit to/from Stand Sit to Stand: Supervision         General transfer comment: supervision for safety. Repeated sit to stand x 7 for strengthening  Ambulation/Gait Ambulation/Gait assistance: Supervision Ambulation Distance (Feet): 350 Feet Assistive device: Rolling walker (2 wheeled) Gait Pattern/deviations: Step-through pattern;Decreased stride length;Trunk flexed   Gait velocity interpretation: >2.62 ft/sec, indicative of community ambulatory General Gait Details: Verbal cues to stay closer to walker and stand more erect  Stairs            Wheelchair Mobility    Modified Rankin (Stroke Patients Only)       Balance Overall balance assessment: Needs assistance Sitting-balance support: No upper extremity supported;Feet supported Sitting balance-Leahy Scale: Good     Standing balance support: No upper extremity supported;During functional activity Standing balance-Leahy Scale: Fair                                Pertinent Vitals/Pain Pain Assessment: No/denies pain    Home Living Family/patient expects to be discharged to:: Private residence Living Arrangements: Children Available Help at Discharge: Family;Available 24 hours/day Type of Home: House Home Access: Stairs to enter Entrance Stairs-Rails: Psychiatric nurse of Steps: 4 Home Layout: Two level;Able to live on main level with bedroom/bathroom Home Equipment: Gilford Rile - 2 wheels;Cane - single point      Prior Function Level of Independence: Needs assistance   Gait / Transfers Assistance Needed: amb modified independent with walker           Hand Dominance   Dominant Hand: Right    Extremity/Trunk Assessment   Upper Extremity Assessment Upper Extremity Assessment: Defer to OT evaluation    Lower Extremity Assessment Lower Extremity Assessment: Generalized weakness       Communication   Communication: No difficulties  Cognition Arousal/Alertness: Awake/alert Behavior During Therapy: WFL for tasks assessed/performed Overall Cognitive Status: Within Functional Limits for tasks assessed                                        General Comments      Exercises     Assessment/Plan    PT Assessment Patient needs continued PT services  PT Problem List Decreased strength;Decreased balance;Decreased mobility;Decreased activity tolerance       PT Treatment Interventions DME instruction;Gait training;Functional mobility training;Therapeutic activities;Therapeutic exercise;Balance  training;Patient/family education    PT Goals (Current goals can be found in the Care Plan section)  Acute Rehab PT Goals Patient Stated Goal: return hom PT Goal Formulation: With patient Time For Goal Achievement: 02/25/18 Potential to Achieve Goals: Good    Frequency Min 3X/week   Barriers to discharge        Co-evaluation               AM-PAC PT "6 Clicks" Daily  Activity  Outcome Measure Difficulty turning over in bed (including adjusting bedclothes, sheets and blankets)?: A Little Difficulty moving from lying on back to sitting on the side of the bed? : A Little Difficulty sitting down on and standing up from a chair with arms (e.g., wheelchair, bedside commode, etc,.)?: A Little Help needed moving to and from a bed to chair (including a wheelchair)?: A Little Help needed walking in hospital room?: A Little Help needed climbing 3-5 steps with a railing? : A Little 6 Click Score: 18    End of Session Equipment Utilized During Treatment: Gait belt Activity Tolerance: Patient tolerated treatment well Patient left: in chair;with call bell/phone within reach Nurse Communication: Mobility status PT Visit Diagnosis: Other abnormalities of gait and mobility (R26.89);Muscle weakness (generalized) (M62.81)    Time: 3007-6226 PT Time Calculation (min) (ACUTE ONLY): 20 min   Charges:   PT Evaluation $PT Eval Moderate Complexity: 1 Mod     PT G CodesMarland Kitchen        [facility] [place] [facility] [place] 02/11/2018, 12:53 PM

## 2018-02-11 NOTE — Progress Notes (Signed)
Progress Note  Patient Name: Ricky [name] Date of Encounter: 02/11/2018  Primary Cardiologist: Virl Axe, MD   Subjective   "I'm feeling much better"  No chest pain and no SOB.  Son is with him.    Inpatient Medications    Scheduled Meds: . apixaban  5 mg Oral BID  . carvedilol  12.5 mg Oral BID  . furosemide  80 mg Intravenous Q8H  . irbesartan  300 mg Oral QHS  . levothyroxine  75 mcg Oral QAC breakfast   Continuous Infusions:  PRN Meds: acetaminophen **OR** acetaminophen, albuterol, bisacodyl, ondansetron **OR** ondansetron (ZOFRAN) IV   Vital Signs    Vitals:   02/10/18 1804 02/10/18 1951 02/11/18 0511 02/11/18 0800  BP: (!) 100/52 137/67 135/65   Pulse: 65 65 65   Resp: (!) 26 17 19  (!) 21  Temp: 98.3 F (36.8 C) 97.7 F (36.5 C) 97.9 F (36.6 C)   TempSrc: Oral Oral Oral   SpO2: 92% 94% 92%   Weight:   151 lb 7.3 oz (68.7 kg)   Height:        Intake/Output Summary (Last 24 hours) at 02/11/2018 0929 Last data filed at 02/11/2018 0700 Gross per 24 hour  Intake 480 ml  Output 650 ml  Net -170 ml   Filed Weights   02/09/18 1603 02/10/18 0629 02/11/18 0511  Weight: 149 lb 4 oz (67.7 kg) 143 lb (64.9 kg) 151 lb 7.3 oz (68.7 kg)    Telemetry    AV pacing  - Personally Reviewed  ECG    No new - Personally Reviewed  Physical Exam   GEN: No acute distress.   Neck: No JVD Cardiac: RRR, no murmurs, rubs, or gallops.  Respiratory: bilateral breath sounds to auscultation bilaterally diminished bil. GI: Soft, nontender, non-distended  MS: Tr to 1+ edema of lower ext; No deformity. Neuro:  Nonfocal  Psych: Normal affect   Labs    Chemistry Recent Labs  Lab 02/09/18 0841 02/10/18 0306 02/11/18 0632  NA 139 140 139  K 4.5 3.7 3.9  CL 95* 100* 99*  CO2 34* 30 30  GLUCOSE 132* 132* 131*  BUN 23* 26* 46*  CREATININE 1.10 0.88 1.13  CALCIUM 8.9 8.1* 8.5*  PROT 7.2  --   --   ALBUMIN 2.9*  --   --   AST 23  --   --   ALT 14*  --   --    ALKPHOS 80  --   --   BILITOT 0.9  --   --   GFRNONAA 56* >60 54*  GFRAA >60 >60 >60  ANIONGAP 10 10 10      Hematology Recent Labs  Lab 02/09/18 0841 02/10/18 0306 02/11/18 0632  WBC 11.2* 7.0 15.2*  RBC 4.07* 3.32* 3.41*  3.41*  HGB 13.7 11.1* 11.3*  HCT 42.4 34.1* 34.8*  MCV 104.2* 102.7* 102.1*  MCH 33.7 33.4 33.1  MCHC 32.3 32.6 32.5  RDW 13.5 13.2 13.4  PLT 464* 357 403*    Cardiac EnzymesNo results for input(s): TROPONINI in the last 168 hours.  Recent Labs  Lab 02/09/18 0900  TROPIPOC 0.03     BNP Recent Labs  Lab 02/09/18 0841  BNP 436.9*     DDimer No results for input(s): DDIMER in the last 168 hours.   Radiology    No results found.  Cardiac Studies   Echo pending  Echocardiogram 11/03/2012: Study Conclusions  - Left ventricle: The cavity size was  normal. Wall thickness was normal. Systolic function was normal. The estimated ejection fraction was in the range of 55% to 60%. Wall motion was normal; there were no regional wall motion abnormalities. - Left atrium: The atrium was mildly to moderately dilated. - Right atrium: The atrium was mildly dilated. - Pulmonary arteries: PA peak pressure: 9mm Hg (S).     Patient Profile     84 y.o. male with history of chronic persistent atrial fibrillation s/p AV node ablation with PPM placement, HTN, chronic diastolic CHF admitted with progressive dyspnea and hypoxic respiratory failure. Chest x-ray in the ER shows congestive heart failure with progressive left upper lobe airspace disease-PNA  as well as bilateral pleural effusions. BNP level 436. Last echocardiogram from 2014 indicated LVEF 55 to 60% range with mild to moderate left atrial enlargement, PASP 40 mmHg.   Assessment & Plan    Acute on chronic diastolic HF --echo pending --CXR with bil pl effusions and pulmonary edema. --neg 2228 and wt is up from 149 to 151. ? Accuracy  --better this AM --on lasix 80 IV every 80 hours.   --neg troponin  Lt upper lobe PNA -progressive airspace disease without the left upper lobe-PNA--per IM  Hx chronic a fib but is AV pacing Hx of AV junction ablation.  PPM St Jude device dependent.  He was in SR with device check 02/04/18 per Dr. [name].  He is on Eliquis.   HTN currently ranging 135/65 to 144/66  --on coreg and avapro    For questions or updates, please contact [place] Please consult [email or web address] for contact info under Cardiology/STEMI.      Signed, Cecilie Kicks, NP  02/11/2018, 9:29 AM

## 2018-02-11 NOTE — Progress Notes (Signed)
Late entry  Patient refused his 0200 lasix. Pt woke up confused and ask this nurse to turn the light off and "go to School".

## 2018-02-12 ENCOUNTER — Other Ambulatory Visit (HOSPITAL_COMMUNITY): Payer: Medicare HMO

## 2018-02-12 ENCOUNTER — Inpatient Hospital Stay (HOSPITAL_COMMUNITY): Payer: Medicare HMO

## 2018-02-12 DIAGNOSIS — I4891 Unspecified atrial fibrillation: Secondary | ICD-10-CM

## 2018-02-12 DIAGNOSIS — E039 Hypothyroidism, unspecified: Secondary | ICD-10-CM

## 2018-02-12 DIAGNOSIS — I361 Nonrheumatic tricuspid (valve) insufficiency: Secondary | ICD-10-CM

## 2018-02-12 LAB — CULTURE, RESPIRATORY W GRAM STAIN: Culture: NORMAL

## 2018-02-12 LAB — BASIC METABOLIC PANEL
ANION GAP: 10 (ref 5–15)
BUN: 43 mg/dL — ABNORMAL HIGH (ref 6–20)
CALCIUM: 8.5 mg/dL — AB (ref 8.9–10.3)
CO2: 35 mmol/L — ABNORMAL HIGH (ref 22–32)
Chloride: 96 mmol/L — ABNORMAL LOW (ref 101–111)
Creatinine, Ser: 1 mg/dL (ref 0.61–1.24)
GFR calc Af Amer: >60 mL/min (ref >60)
Glucose, Bld: 94 mg/dL (ref 65–99)
POTASSIUM: 3.5 mmol/L (ref 3.5–5.1)
SODIUM: 141 mmol/L (ref 135–145)

## 2018-02-12 LAB — ECHOCARDIOGRAM COMPLETE
Height: 70 in
Weight: 2363.33 oz

## 2018-02-12 LAB — CULTURE, RESPIRATORY

## 2018-02-12 MED ORDER — TORSEMIDE 20 MG PO TABS
40.0000 mg | ORAL_TABLET | Freq: Two times a day (BID) | ORAL | Status: DC
  Administered 2018-02-12 – 2018-02-13 (×2): 40 mg via ORAL
  Filled 2018-02-12 (×3): qty 2

## 2018-02-12 MED ORDER — DOXYCYCLINE HYCLATE 100 MG PO TABS
100.0000 mg | ORAL_TABLET | Freq: Two times a day (BID) | ORAL | Status: DC
  Administered 2018-02-12 – 2018-02-13 (×2): 100 mg via ORAL
  Filled 2018-02-12 (×2): qty 1

## 2018-02-12 MED ORDER — SPIRONOLACTONE 25 MG PO TABS
25.0000 mg | ORAL_TABLET | Freq: Every day | ORAL | Status: DC
  Administered 2018-02-12 – 2018-02-13 (×2): 25 mg via ORAL
  Filled 2018-02-12 (×2): qty 1

## 2018-02-12 NOTE — Discharge Instructions (Signed)
Information on my medicine - ELIQUIS (apixaban)  This medication education was reviewed with me or my healthcare representative as part of my discharge preparation.    Why was Eliquis prescribed for you? Eliquis was prescribed for you to reduce the risk of a blood clot forming that can cause a stroke if you have a medical condition called atrial fibrillation (a type of irregular heartbeat).  What do You need to know about Eliquis ? Take your Eliquis TWICE DAILY - one tablet in the morning and one tablet in the evening with or without food. If you have difficulty swallowing the tablet whole please discuss with your pharmacist how to take the medication safely.  Take Eliquis exactly as prescribed by your doctor and DO NOT stop taking Eliquis without talking to the doctor who prescribed the medication.  Stopping may increase your risk of developing a stroke.  Refill your prescription before you run out.  After discharge, you should have regular check-up appointments with your healthcare provider that is prescribing your Eliquis.  In the future your dose may need to be changed if your kidney function or weight changes by a significant amount or as you get older.  What do you do if you miss a dose? If you miss a dose, take it as soon as you remember on the same day and resume taking twice daily.  Do not take more than one dose of ELIQUIS at the same time to make up a missed dose.  Important Safety Information A possible side effect of Eliquis is bleeding. You should call your healthcare provider right away if you experience any of the following: ? Bleeding from an injury or your nose that does not stop. ? Unusual colored urine (red or dark brown) or unusual colored stools (red or black). ? Unusual bruising for unknown reasons. ? A serious fall or if you hit your head (even if there is no bleeding).  Some medicines may interact with Eliquis and might increase your risk of bleeding or  clotting while on Eliquis. To help avoid this, consult your healthcare provider or pharmacist prior to using any new prescription or non-prescription medications, including herbals, vitamins, non-steroidal anti-inflammatory drugs (NSAIDs) and supplements.  This website has more information on Eliquis (apixaban): http://www.eliquis.com/eliquis/home  Weigh daily, if wt climbs more than 3 lbs in a day or 5 Lbs in a week let Dr. [name] office know.   No more than 2000 mg of salt per day

## 2018-02-12 NOTE — Plan of Care (Signed)
  Problem: Clinical Measurements: Goal: Respiratory complications will improve Outcome: Progressing   Problem: Coping: Goal: Level of anxiety will decrease Outcome: Progressing   

## 2018-02-12 NOTE — Progress Notes (Addendum)
Progress Note  Patient Name: Ricky [name] Date of Encounter: 02/12/2018  Primary Cardiologist: Virl Axe, MD   Subjective   No complaints sitting up in, no SOB no chest pain   Inpatient Medications    Scheduled Meds: . apixaban  5 mg Oral BID  . carvedilol  12.5 mg Oral BID  . furosemide  80 mg Intravenous Q8H  . irbesartan  300 mg Oral QHS  . levothyroxine  75 mcg Oral QAC breakfast   Continuous Infusions:  PRN Meds: acetaminophen **OR** acetaminophen, albuterol, bisacodyl, ondansetron **OR** ondansetron (ZOFRAN) IV   Vital Signs    Vitals:   02/11/18 1500 02/11/18 1728 02/11/18 2144 02/12/18 0424  BP:  (!) 146/69  (!) 129/52  Pulse:  73  65  Resp:  18  18  Temp:   (!) 94.8 F (34.9 C) (!) 95.5 F (35.3 C)  TempSrc:    Oral  SpO2:  91%  93%  Weight: 145 lb 8.1 oz (66 kg)   147 lb 11.3 oz (67 kg)  Height:        Intake/Output Summary (Last 24 hours) at 02/12/2018 0921 Last data filed at 02/12/2018 0653 Gross per 24 hour  Intake 320 ml  Output 3050 ml  Net -2730 ml   Filed Weights   02/11/18 0511 02/11/18 1500 02/12/18 0424  Weight: 151 lb 7.3 oz (68.7 kg) 145 lb 8.1 oz (66 kg) 147 lb 11.3 oz (67 kg)    Telemetry    AV pacing - Personally Reviewed  ECG    No new - Personally Reviewed  Physical Exam   GEN: No acute distress.   Neck: No JVD sitting up Cardiac: RRR, soft murmur, no rubs, or gallops.  Respiratory: Clear to diminished to auscultation bilaterally. GI: Soft, nontender, non-distended  MS: No edema; No deformity. Neuro:  Nonfocal  Psych: Normal affect   Labs    Chemistry Recent Labs  Lab 02/09/18 0841 02/10/18 0306 02/11/18 0632 02/12/18 0520  NA 139 140 139 141  K 4.5 3.7 3.9 3.5  CL 95* 100* 99* 96*  CO2 34* 30 30 35*  GLUCOSE 132* 132* 131* 94  BUN 23* 26* 46* 43*  CREATININE 1.10 0.88 1.13 1.00  CALCIUM 8.9 8.1* 8.5* 8.5*  PROT 7.2  --   --   --   ALBUMIN 2.9*  --   --   --   AST 23  --   --   --   ALT 14*   --   --   --   ALKPHOS 80  --   --   --   BILITOT 0.9  --   --   --   GFRNONAA 56* >60 54* >60  GFRAA >60 >60 >60 >60  ANIONGAP 10 10 10 10      Hematology Recent Labs  Lab 02/09/18 0841 02/10/18 0306 02/11/18 0632  WBC 11.2* 7.0 15.2*  RBC 4.07* 3.32* 3.41*  3.41*  HGB 13.7 11.1* 11.3*  HCT 42.4 34.1* 34.8*  MCV 104.2* 102.7* 102.1*  MCH 33.7 33.4 33.1  MCHC 32.3 32.6 32.5  RDW 13.5 13.2 13.4  PLT 464* 357 403*    Cardiac EnzymesNo results for input(s): TROPONINI in the last 168 hours.  Recent Labs  Lab 02/09/18 0900  TROPIPOC 0.03     BNP Recent Labs  Lab 02/09/18 0841  BNP 436.9*     DDimer No results for input(s): DDIMER in the last 168 hours.   Radiology  Dg Chest Port 1 View  Result Date: 02/11/2018 CLINICAL DATA:  Shortness of breath, congestion EXAM: PORTABLE CHEST 1 VIEW COMPARISON:  02/09/2018 FINDINGS: Right pacer remains in place, unchanged. Improving airspace disease in the right lung. Continued diffuse airspace disease throughout the left lung, unchanged. Small bilateral pleural effusions. Heart is upper limits normal in size. IMPRESSION: Continued diffuse left lung airspace disease concerning for pneumonia. Improving aeration throughout the right lung with decreasing right lung airspace opacities. Small bilateral effusions. Electronically Signed   By: Rolm Baptise M.D.   On: 02/11/2018 11:13    Cardiac Studies   Echo pending  Patient Profile     84 y.o. male with history of chronic persistent atrial fibrillation s/p AV node ablation with PPM placement, HTN, chronic diastolic CHF admitted with progressive dyspnea and hypoxic respiratory failure.Chest x-ray in the ER shows congestive heart failure with progressive left upper lobe airspace disease-PNA  as well as bilateral pleural effusions. BNP level 436. Last echocardiogram from 2014 indicated LVEF 55 to 60% range with mild to moderate left atrial enlargement, PASP 40 mmHg.   Assessment &  Plan    Acute on chronic diastolic HF  --echo is still pending  --CXR appears to be PNA, will ask IM review sm. Bilateral effusions. --hypothermic and temp  --negative 4838 since admit --wt down 2 lbs since admit --cr is 1.0  And K+ 3.5   --on lasix 80 mg IV every 8 hours, was on torsemide 30 mg daily, ? Change back today?  Lt upper lobe PNA on CXR per IM  --WBC is 15.2 yesterday afebrile to hypothermia  Hx of chronic a fib but is AV pacing hx of AV junction ablation with PPM, st jude-  Was in SR on device check --on Eliquis  HTN stable on coreg and avapro --BP stable 129/52 on avapro and coreg.  For questions or updates, please contact [place] Please consult [email or web address] for contact info under Cardiology/STEMI.     Signed, Cecilie Kicks, NP  02/12/2018, 9:21 AM    The patient was seen, examined and discussed with Cecilie Kicks, NP and I agree with the above.   Great diuresis overnight, appears almost euvolemic, I would switch to torsemide 40 mg PO BID and add spironolactone 25 mg po daily. Echo is pending. ? Pneumonia on CXR - per primary team.  The patient could be discharged from cardiac standpoint, please notify us so we can schedule a follow up.  Ena Dawley, MD 02/12/2018

## 2018-02-12 NOTE — Progress Notes (Signed)
PROGRESS NOTE  MIGUELANGEL KORN  UKG:[id number] DOB: 1922-10-17 DOA: 02/09/2018 PCP: Cassandria Anger, MD   Brief Narrative: LANNY LIPKIN is a 84 y.o. male with a history of AFib s/p AV nodal ablation, PPM, remote tobacco use, HTN, chronic diastolic CHF who presented with dyspnea. With suspicion of CHF, cardiology increased diuretics as outpatient. He has been treated for pneumonia recently as well. He was hypoxic on admission, and admitted for acute on chronic diastolic CHF, started on IV lasix. Cardiology consulted. He has developed a leukocytosis and while RUL opacities from recent PNA have improved, LUL opacities are concerning for pneumonia.   Assessment & Plan: Principal Problem:   Acute CHF (congestive heart failure) (HCC) Active Problems:   Hypothyroidism (acquired)   Atrial fibrillation with RVR (HCC)   Pacemaker-St. Jude   Essential hypertension   Respiratory distress, acute   DNR (do not resuscitate)   Acute respiratory failure ([place])  Acute hypoxic respiratory failure: Resolved  Acute on chronic diastolic CHF: EF preserved on echo, no regional WMAs.  - Convert IV lasix to po torsemide, spiro, recheck BMP in AM - Daily weights, I/O  LUL CAP: Developed leukocytosis and has a cough (albeit very mild). Monocytic predominance noted.  - Start doxycycline. Discussed at length with pt and daughter the different antibiotics that are more appropriate though he has multiple allergies significantly restricting choices.  - Monitor leukocytosis. Not toxic. If breathing stable in AM, can DC home.   Chronic atrial fibrillation: Rate controlled, paced.  - Continue coreg, eliquis  HTN:  - Continue ARB, coreg  Hypothyroidism: Last TSH 1.11 - Continue synthroid  Macrocytic anemia:  - Monitor. Anemia panel showed adequate B12, folate, iron.   DVT prophylaxis: Eliquis Code Status: DNR Family Communication: Daughter at bedside Disposition Plan: DC home w/HH if stable over next  24 hours.  Consultants:   Cardiology  Procedures:   Echocardiogram 02/12/2018: - Left ventricle: The cavity size was normal. Wall thickness was   increased in a pattern of mild LVH. Systolic function was normal.   The estimated ejection fraction was in the range of 60% to 65%.   Wall motion was normal; there were no regional wall motion   abnormalities. The study is not technically sufficient to allow   evaluation of LV diastolic function. - Aortic valve: Sclerosis without stenosis. There was no   regurgitation. - Mitral valve: Mildly thickened leaflets . There was mild   regurgitation. - Left atrium: The atrium was normal in size. - Right ventricle: The cavity size was mildly dilated. Wall   thickness was normal. Pacer wire or catheter noted in right   ventricle. Systolic function was normal. - Right atrium: Moderately dilated. Pacer wire or catheter noted in   right atrium. - Tricuspid valve: There was mild regurgitation. - Pulmonary arteries: PA peak pressure: 33 mm Hg (S). - Inferior vena cava: The vessel was normal in size. The   respirophasic diameter changes were in the normal range (>= 50%),   consistent with normal central venous pressure.  Impressions: - LVEF 60-65%, mild LVH, normal wall motion, mild MR, normal LA   size, mildy dilated RV with normal systolic function, moderate   LAE, mild TR, RVSP 33 mmHg, normal IVC, pacer wires noted.  Antimicrobials:  Doxycycline 5/29 >>    Subjective: Shortness of breath is improved, but feels generally weak worse than usual. No chest pain. Has a very mild cough but no palpitations.  Objective: Vitals:   02/11/18 2144  02/12/18 0424 02/12/18 0923 02/12/18 1335  BP:  (!) 129/52 135/68 (!) 163/74  Pulse:  65    Resp:  18 (!) 22 (!) 24  Temp: (!) 94.8 F (34.9 C) (!) 95.5 F (35.3 C)  98.7 F (37.1 C)  TempSrc:  Oral    SpO2:  93%    Weight:  67 kg (147 lb 11.3 oz)    Height:        Intake/Output Summary (Last 24  hours) at 02/12/2018 1640 Last data filed at 02/12/2018 1200 Gross per 24 hour  Intake 200 ml  Output 3250 ml  Net -3050 ml   Filed Weights   02/11/18 0511 02/11/18 1500 02/12/18 0424  Weight: 68.7 kg (151 lb 7.3 oz) 66 kg (145 lb 8.1 oz) 67 kg (147 lb 11.3 oz)    Gen: Elderly male in no distress  Pulm: Non-labored breathing room air. Mildly diminished in LUL. No crackles or wheezes. CV: Regular, paced. No murmur, rub, or gallop. No JVD, no significant pedal edema. GI: Abdomen soft, non-tender, non-distended, with normoactive bowel sounds. No organomegaly or masses felt. Ext: Warm, no deformities Skin: No rashes, lesions no ulcers Neuro: Alert and oriented. No focal neurological deficits. Psych: Judgement and insight appear normal. Mood & affect appropriate.   Data Reviewed: I have personally reviewed following labs and imaging studies  CBC: Recent Labs  Lab 02/09/18 0841 02/10/18 0306 02/11/18 0632  WBC 11.2* 7.0 15.2*  NEUTROABS 7.6  --   --   HGB 13.7 11.1* 11.3*  HCT 42.4 34.1* 34.8*  MCV 104.2* 102.7* 102.1*  PLT 464* 357 119*   Basic Metabolic Panel: Recent Labs  Lab 02/09/18 0841 02/10/18 0306 02/11/18 0632 02/12/18 0520  NA 139 140 139 141  K 4.5 3.7 3.9 3.5  CL 95* 100* 99* 96*  CO2 34* 30 30 35*  GLUCOSE 132* 132* 131* 94  BUN 23* 26* 46* 43*  CREATININE 1.10 0.88 1.13 1.00  CALCIUM 8.9 8.1* 8.5* 8.5*   GFR: Estimated Creatinine Clearance: 44.7 mL/min (by C-G formula based on SCr of 1 mg/dL). Liver Function Tests: Recent Labs  Lab 02/09/18 0841  AST 23  ALT 14*  ALKPHOS 80  BILITOT 0.9  PROT 7.2  ALBUMIN 2.9*   No results for input(s): LIPASE, AMYLASE in the last 168 hours. No results for input(s): AMMONIA in the last 168 hours. Coagulation Profile: No results for input(s): INR, PROTIME in the last 168 hours. Cardiac Enzymes: No results for input(s): CKTOTAL, CKMB, CKMBINDEX, TROPONINI in the last 168 hours. BNP (last 3 results) Recent  Labs    01/30/18 1104  PROBNP 501.0*   HbA1C: No results for input(s): HGBA1C in the last 72 hours. CBG: No results for input(s): GLUCAP in the last 168 hours. Lipid Profile: No results for input(s): CHOL, HDL, LDLCALC, TRIG, CHOLHDL, LDLDIRECT in the last 72 hours. Thyroid Function Tests: No results for input(s): TSH, T4TOTAL, FREET4, T3FREE, THYROIDAB in the last 72 hours. Anemia Panel: Recent Labs    02/11/18 0632  VITAMINB12 1,115*  FOLATE 21.5  FERRITIN 369*  TIBC 169*  IRON 57  RETICCTPCT 2.3   Urine analysis:    Component Value Date/Time   COLORURINE YELLOW 02/09/2018 [place] 02/09/2018 0850   LABSPEC 1.016 02/09/2018 0850   PHURINE 7.0 02/09/2018 0850   GLUCOSEU NEGATIVE 02/09/2018 0850   GLUCOSEU NEGATIVE 12/28/2013 0845   HGBUR NEGATIVE 02/09/2018 0850   BILIRUBINUR NEGATIVE 02/09/2018 0850   KETONESUR NEGATIVE  02/09/2018 0850   PROTEINUR NEGATIVE 02/09/2018 0850   UROBILINOGEN 0.2 12/28/2013 0845   NITRITE NEGATIVE 02/09/2018 0850   LEUKOCYTESUR NEGATIVE 02/09/2018 0850   Recent Results (from the past 240 hour(s))  Blood Culture (routine x 2)     Status: None (Preliminary result)   Collection Time: 02/09/18  8:24 AM  Result Value Ref Range Status   Specimen Description BLOOD LEFT ANTECUBITAL  Final   Special Requests   Final    BOTTLES DRAWN AEROBIC AND ANAEROBIC Blood Culture results may not be optimal due to an inadequate volume of blood received in culture bottles   Culture   Final    NO GROWTH 3 DAYS Performed at [facility], [place] [address], [place], [place] [address]    Report Status PENDING  Incomplete  Culture, expectorated sputum-assessment     Status: None   Collection Time: 02/09/18  3:50 PM  Result Value Ref Range Status   Specimen Description EXPECTORATED SPUTUM  Final   Special Requests NONE  Final   Sputum evaluation   Final    THIS SPECIMEN IS ACCEPTABLE FOR SPUTUM CULTURE Performed at [place], [place] [address] [place], [place] [address]    Report Status 02/09/2018 FINAL  Final  Culture, respiratory (NON-Expectorated)     Status: None   Collection Time: 02/09/18  3:50 PM  Result Value Ref Range Status   Specimen Description EXPECTORATED SPUTUM  Final   Special Requests NONE Reflexed from [id number]  Final   Gram Stain   Final    MODERATE WBC PRESENT, PREDOMINANTLY PMN FEW SQUAMOUS EPITHELIAL CELLS PRESENT ABUNDANT GRAM POSITIVE COCCI IN CHAINS FEW GRAM POSITIVE COCCI IN CLUSTERS    Culture   Final    Consistent with normal respiratory flora. Performed at [facility], [place] [address] [place], [place] [address]    Report Status 02/12/2018 FINAL  Final  Blood Culture (routine x 2)     Status: None (Preliminary result)   Collection Time: 02/09/18  4:29 PM  Result Value Ref Range Status   Specimen Description BLOOD LEFT ANTECUBITAL  Final   Special Requests   Final    BOTTLES DRAWN AEROBIC ONLY Blood Culture adequate volume   Culture   Final    NO GROWTH 3 DAYS Performed at [facility], [place] [address], [place], [place] [address]    Report Status PENDING  Incomplete      Radiology Studies: Dg Chest Port 1 View  Result Date: 02/11/2018 CLINICAL DATA:  Shortness of breath, congestion EXAM: PORTABLE CHEST 1 VIEW COMPARISON:  02/09/2018 FINDINGS: Right pacer remains in place, unchanged. Improving airspace disease in the right lung. Continued diffuse airspace disease throughout the left lung, unchanged. Small bilateral pleural effusions. Heart is upper limits normal in size. IMPRESSION: Continued diffuse left lung airspace disease concerning for pneumonia. Improving aeration throughout the right lung with decreasing right lung airspace opacities. Small bilateral effusions. Electronically Signed   By: Rolm Baptise M.D.   On: 02/11/2018 11:13    Scheduled Meds: . apixaban  5 mg Oral BID  . carvedilol  12.5 mg Oral BID  . doxycycline  100 mg Oral Q12H  . irbesartan  300  mg Oral QHS  . levothyroxine  75 mcg Oral QAC breakfast  . spironolactone  25 mg Oral Daily  . torsemide  40 mg Oral BID   Continuous Infusions:   LOS: 3 days   Time spent: 35 minutes.  Patrecia Pour, MD [facility] [email or web address] Password [facility] 02/12/2018, 4:41 PM

## 2018-02-12 NOTE — Progress Notes (Signed)
  Echocardiogram 2D Echocardiogram has been performed.  Ricky [name] 02/12/2018, 11:24 AM

## 2018-02-12 NOTE — Plan of Care (Signed)
  Problem: Clinical Measurements: Goal: Respiratory complications will improve Outcome: Progressing   Problem: Activity: Goal: Risk for activity intolerance will decrease Outcome: Progressing   

## 2018-02-12 NOTE — Progress Notes (Signed)
Physical Therapy Treatment Patient Details Name: Ricky [name] MRN: [id number] DOB: 1923/03/01 Today's Date: 02/12/2018    History of Present Illness Pt adm with acute on chronic heart failure. PMH - HTN, chf, pna, afib, pacer    PT Comments    Pt was up to walk today with O2 sats stable on room air, at 95% and no signficiant signs of distress to walk on the hall.  Pt is progressing and looking positive to keep the plan in place for transition home. Follow acutely for strength, balance and tolerance for gait with limitations of his endurance.  Will see as needed to prepare for home.   Follow Up Recommendations  Home health PT;Supervision - Intermittent     Equipment Recommendations  None recommended by PT    Recommendations for Other Services       Precautions / Restrictions Precautions Precautions: Fall Restrictions Weight Bearing Restrictions: No    Mobility  Bed Mobility               General bed mobility comments: Pt up in chair  Transfers Overall transfer level: Needs assistance Equipment used: Rolling walker (2 wheeled) Transfers: Sit to/from Stand Sit to Stand: Min guard         General transfer comment: min guard to protect safety and steady initially  Ambulation/Gait Ambulation/Gait assistance: Min guard Ambulation Distance (Feet): 200 Feet Assistive device: Rolling walker (2 wheeled) Gait Pattern/deviations: Step-through pattern;Decreased stride length;Wide base of support Gait velocity: reduced Gait velocity interpretation: <1.31 ft/sec, indicative of household ambulator General Gait Details: reminders for turns but pt prefers to do himself   Chief Strategy Officer    Modified Rankin (Stroke Patients Only)       Balance Overall balance assessment: Needs assistance Sitting-balance support: No upper extremity supported;Feet supported Sitting balance-Leahy Scale: Good     Standing balance support: Bilateral  upper extremity supported;During functional activity Standing balance-Leahy Scale: Fair                              Cognition Arousal/Alertness: Awake/alert Behavior During Therapy: WFL for tasks assessed/performed Overall Cognitive Status: Within Functional Limits for tasks assessed                                        Exercises General Exercises - Lower Extremity Ankle Circles/Pumps: AROM;Both;10 reps Long Arc Quad: Strengthening;Both;10 reps Heel Slides: Strengthening;Both;10 reps Toe Raises: AROM;Both;10 reps    General Comments General comments (skin integrity, edema, etc.): son present for visit to observe gait      Pertinent Vitals/Pain Pain Assessment: No/denies pain    Home Living                      Prior Function            PT Goals (current goals can now be found in the care plan section) Acute Rehab PT Goals Patient Stated Goal: return home Progress towards PT goals: Progressing toward goals    Frequency    Min 3X/week      PT Plan Current plan remains appropriate    Co-evaluation              AM-PAC PT "6 Clicks" Daily Activity  Outcome Measure  Difficulty turning over  in bed (including adjusting bedclothes, sheets and blankets)?: A Little Difficulty moving from lying on back to sitting on the side of the bed? : A Little Difficulty sitting down on and standing up from a chair with arms (e.g., wheelchair, bedside commode, etc,.)?: A Little Help needed moving to and from a bed to chair (including a wheelchair)?: A Little Help needed walking in hospital room?: A Little Help needed climbing 3-5 steps with a railing? : A Little 6 Click Score: 18    End of Session Equipment Utilized During Treatment: Gait belt Activity Tolerance: Patient tolerated treatment well;Patient limited by fatigue Patient left: in chair;with call bell/phone within reach Nurse Communication: Mobility status PT Visit  Diagnosis: Other abnormalities of gait and mobility (R26.89);Muscle weakness (generalized) (M62.81)     Time: 1631-1700 PT Time Calculation (min) (ACUTE ONLY): 29 min  Charges:  $Gait Training: 8-22 mins $Therapeutic Exercise: 8-22 mins                    G Codes:  Functional Assessment Tool Used: AM-PAC 6 Clicks Basic Mobility     Ramond Dial 02/12/2018, 6:10 PM   Mee Hives, PT MS Acute Rehab Dept. Number: [place]

## 2018-02-13 DIAGNOSIS — J9601 Acute respiratory failure with hypoxia: Secondary | ICD-10-CM

## 2018-02-13 DIAGNOSIS — I5031 Acute diastolic (congestive) heart failure: Secondary | ICD-10-CM

## 2018-02-13 LAB — CBC WITH DIFFERENTIAL/PLATELET
ABS IMMATURE GRANULOCYTES: 0.2 10*3/uL — AB (ref 0.0–0.1)
BASOS ABS: 0 10*3/uL (ref 0.0–0.1)
BASOS PCT: 0 %
Eosinophils Absolute: 1 10*3/uL — ABNORMAL HIGH (ref 0.0–0.7)
Eosinophils Relative: 11 %
HCT: 35.5 % — ABNORMAL LOW (ref 39.0–52.0)
HEMOGLOBIN: 11.4 g/dL — AB (ref 13.0–17.0)
Immature Granulocytes: 2 %
LYMPHS PCT: 17 %
Lymphs Abs: 1.6 10*3/uL (ref 0.7–4.0)
MCH: 33.2 pg (ref 26.0–34.0)
MCHC: 32.1 g/dL (ref 30.0–36.0)
MCV: 103.5 fL — ABNORMAL HIGH (ref 78.0–100.0)
Monocytes Absolute: 1.2 10*3/uL — ABNORMAL HIGH (ref 0.1–1.0)
Monocytes Relative: 13 %
NEUTROS ABS: 5.5 10*3/uL (ref 1.7–7.7)
Neutrophils Relative %: 57 %
PLATELETS: 382 10*3/uL (ref 150–400)
RBC: 3.43 MIL/uL — AB (ref 4.22–5.81)
RDW: 13.3 % (ref 11.5–15.5)
WBC: 9.5 10*3/uL (ref 4.0–10.5)

## 2018-02-13 LAB — BASIC METABOLIC PANEL
ANION GAP: 7 (ref 5–15)
BUN: 34 mg/dL — ABNORMAL HIGH (ref 6–20)
CALCIUM: 8.2 mg/dL — AB (ref 8.9–10.3)
CO2: 34 mmol/L — ABNORMAL HIGH (ref 22–32)
Chloride: 97 mmol/L — ABNORMAL LOW (ref 101–111)
Creatinine, Ser: 0.92 mg/dL (ref 0.61–1.24)
Glucose, Bld: 90 mg/dL (ref 65–99)
POTASSIUM: 3.4 mmol/L — AB (ref 3.5–5.1)
SODIUM: 138 mmol/L (ref 135–145)

## 2018-02-13 MED ORDER — DOXYCYCLINE HYCLATE 100 MG PO TABS: 100.0000 mg | ORAL_TABLET | Freq: Two times a day (BID) | ORAL | 0 refills | Status: DC

## 2018-02-13 MED ORDER — TORSEMIDE 20 MG PO TABS: 20.0000 mg | ORAL_TABLET | Freq: Two times a day (BID) | ORAL | 0 refills | Status: DC

## 2018-02-13 MED ORDER — SPIRONOLACTONE 25 MG PO TABS: 25.0000 mg | ORAL_TABLET | Freq: Every day | ORAL | 0 refills | Status: DC

## 2018-02-13 MED ORDER — CARVEDILOL 12.5 MG PO TABS: 12.5000 mg | ORAL_TABLET | Freq: Two times a day (BID) | ORAL | 0 refills | Status: DC

## 2018-02-13 MED ORDER — TORSEMIDE 20 MG PO TABS
20.0000 mg | ORAL_TABLET | Freq: Two times a day (BID) | ORAL | Status: DC
  Filled 2018-02-13: qty 1

## 2018-02-13 NOTE — Progress Notes (Signed)
Occupational Therapy Treatment Patient Details Name: Ricky [name] MRN: [id number] DOB: November 01, 1922 Today's Date: 02/13/2018    History of present illness Pt adm with acute on chronic heart failure. PMH - HTN, chf, pna, afib, pacer   OT comments  Focus of session on education of energy conservation and activity pacing strategies. Pt able to perform functional mobility with supervision and use of RW; pt continues to have decreased activity tolerance but reports he is feeling better than before. D/c plan remains appropriate. Will continue to follow acutely.   Follow Up Recommendations  Home health OT;Supervision/Assistance - 24 hour    Equipment Recommendations  None recommended by OT    Recommendations for Other Services      Precautions / Restrictions Precautions Precautions: Fall Restrictions Weight Bearing Restrictions: No       Mobility Bed Mobility               General bed mobility comments: Pt OOB in chair upon arrival  Transfers Overall transfer level: Needs assistance Equipment used: Rolling walker (2 wheeled) Transfers: Sit to/from Stand Sit to Stand: Supervision         General transfer comment: for safety, good hand placement and technique    Balance Overall balance assessment: Needs assistance Sitting-balance support: Feet supported Sitting balance-Leahy Scale: Good     Standing balance support: Bilateral upper extremity supported Standing balance-Leahy Scale: Poor Standing balance comment: RW for support                           ADL either performed or assessed with clinical judgement   ADL Overall ADL's : Needs assistance/impaired                         Toilet Transfer: Supervision/safety;Ambulation;RW           Functional mobility during ADLs: Supervision/safety;Rolling walker General ADL Comments: Educated pt on energy conservation strategies and activity pacing; he verbalized understanding. Discussed  building endurance via participation in functional activities.     Vision       Perception     Praxis      Cognition Arousal/Alertness: Awake/alert Behavior During Therapy: WFL for tasks assessed/performed Overall Cognitive Status: Within Functional Limits for tasks assessed                                          Exercises     Shoulder Instructions       General Comments      Pertinent Vitals/ Pain       Pain Assessment: No/denies pain  Home Living                                          Prior Functioning/Environment              Frequency  Min 2X/week        Progress Toward Goals  OT Goals(current goals can now be found in the care plan section)  Progress towards OT goals: Progressing toward goals  Acute Rehab OT Goals Patient Stated Goal: return home OT Goal Formulation: With patient  Plan Discharge plan remains appropriate    Co-evaluation  AM-PAC PT "6 Clicks" Daily Activity     Outcome Measure   Help from another person eating meals?: None Help from another person taking care of personal grooming?: A Little Help from another person toileting, which includes using toliet, bedpan, or urinal?: A Little Help from another person bathing (including washing, rinsing, drying)?: A Little Help from another person to put on and taking off regular upper body clothing?: A Little Help from another person to put on and taking off regular lower body clothing?: A Little 6 Click Score: 19    End of Session Equipment Utilized During Treatment: Rolling walker  OT Visit Diagnosis: Unsteadiness on feet (R26.81);Other abnormalities of gait and mobility (R26.89);Muscle weakness (generalized) (M62.81)   Activity Tolerance Patient tolerated treatment well   Patient Left in chair;with call bell/phone within reach;with family/visitor present   Nurse Communication          Time: 6015-6153 OT Time  Calculation (min): 12 min  Charges: OT General Charges $OT Visit: 1 Visit OT Treatments $Therapeutic Activity: 8-22 mins  Ricky Wattley A. Ulice [name], M.S., OTR/L Acute Rehab Department: [phone]  Binnie Kand 02/13/2018, 10:45 AM

## 2018-02-13 NOTE — Discharge Summary (Signed)
Physician Discharge Summary  Ricky [name] RFF:[id number] DOB: 1923/03/28 DOA: 02/09/2018  PCP: Cassandria Anger, MD  Admit date: 02/09/2018 Discharge date: 02/13/2018  Admitted From: Home Disposition: Home   Recommendations for Outpatient Follow-up:  1. Follow up with PCP in 1-2 weeks 2. Please obtain BMP/CBC in one week 3. Follow up with cardiology as scheduled  Home Health: Resume PT, OT, RN, aide Equipment/Devices: Has rolling walker Discharge Condition: Stable CODE STATUS: DNR Diet recommendation: Heart healthy  Brief/Interim Summary: Ricky [name] is a 84 y.o. male with a history of AFib s/p AV nodal ablation, PPM, remote tobacco use, HTN, chronic diastolic CHF who presented with dyspnea. With suspicion of CHF, cardiology increased diuretics as outpatient. He has been treated for pneumonia recently as well. He was hypoxic on admission, and admitted for acute on chronic diastolic CHF, started on IV lasix. Cardiology consulted. He has developed a leukocytosis and while RUL opacities from recent PNA have improved, LUL opacities are concerning for pneumonia. Doxycycline was started and leukocytosis has resolved. Diuretics were transitioned to PO torsemide and spironolactone with continued diuresis and resolution of dyspnea.  Discharge Diagnoses:  Principal Problem:   Acute CHF (congestive heart failure) (HCC) Active Problems:   Hypothyroidism (acquired)   Atrial fibrillation with RVR (HCC)   Pacemaker-[employer]   Essential hypertension   Respiratory distress, acute   DNR (do not resuscitate)   Acute respiratory failure ([place])  Acute hypoxic respiratory failure: Resolved  Acute on chronic diastolic CHF: EF preserved on echo, no regional WMAs.  - Converted IV lasix to po torsemide, spiro which will continue per cardiology at discharge: torsemide 20 mg PO BID and continue spironolactone 25 mg po daily - Recheck BMP at follow up - Daily weight monitoring at home.  LUL  CAP: Developed leukocytosis and has a cough (albeit very mild). Monocytic predominance noted.  - Started doxycycline x5 days. Discussed at length with pt and daughter the different antibiotics that are more appropriate though he has multiple allergies significantly restricting choices.   Note pt on anoro ellipta PTA though denies history of COPD:  - Consider PFTs.  Chronic atrial fibrillation: Rate controlled, paced.  - Continue coreg, eliquis  HTN:  - Continue ARB, coreg  Hypothyroidism: Last TSH 1.11 - Continue synthroid  Macrocytic anemia:  - Monitor. Anemia panel showed adequate B12, folate, iron.   Discharge Instructions Discharge Instructions    (HEART FAILURE PATIENTS) Call MD:  Anytime you have any of the following symptoms: 1) 3 pound weight gain in 24 hours or 5 pounds in 1 week 2) shortness of breath, with or without a dry hacking cough 3) swelling in the hands, feet or stomach 4) if you have to sleep on extra pillows at night in order to breathe.   Complete by:  As directed    Diet - low sodium heart healthy   Complete by:  As directed    Discharge instructions   Complete by:  As directed    You were admitted with acute congestive heart failure which has improved with IV medications and remained stable with cardiology guiding transition to oral medications. You should continue taking medications as you were with a few exceptions:  - Take torsemide 20mg  twice daily  - START taking spironolactone once daily to help get rid of fluid. This can lower your blood pressure, so the dose of coreg was decreased:  - Take coreg 12.5mg  twice daily - For pneumonia, continue taking doxycycline twice daily until you  run out of medications (5 total days of treatment) - If your symptoms return, seek medical attention right away. Otherwise follow up with your primary doctor in the next 1-2 weeks and follow up with cardiology as scheduled.   Increase activity slowly   Complete by:  As  directed      Allergies as of 02/13/2018      Reactions   Bee Venom Anaphylaxis   Amiodarone Hcl    Weakness in muscles    Atorvastatin    unknown   Benazepril Hcl    unknown   Clindamycin/lincomycin Other (See Comments)   Abdominal discomfort    Clonidine Hydrochloride    unknown   Colesevelam    unknown   Diltiazem Hcl    unknown   Ezetimibe    Unknown    Levaquin [levofloxacin In D5w] Other (See Comments)   Halucinations   Verapamil Other (See Comments)   unknown   Amoxicillin Rash   Has patient had a PCN reaction causing immediate rash, facial/tongue/throat swelling, SOB or lightheadedness with hypotension: Yes Has patient had a PCN reaction causing severe rash involving mucus membranes or skin necrosis: No Has patient had a PCN reaction that required hospitalization Unknow Has patient had a PCN reaction occurring within the last 10 years: Unknown If all of the above answers are "NO", then may proceed with Cephalosporin use.      Medication List    TAKE these medications   apixaban 5 MG Tabs tablet Commonly known as:  ELIQUIS Take 1 tablet (5 mg total) by mouth 2 (two) times daily.   carvedilol 12.5 MG tablet Commonly known as:  COREG Take 1 tablet (12.5 mg total) by mouth 2 (two) times daily. What changed:    medication strength  how much to take   Cholecalciferol 1000 units tablet Take 1,000 Units by mouth daily. Reported on 12/23/2015   diphenhydrAMINE 25 mg capsule Commonly known as:  BENADRYL Take 1-2 capsules (25-50 mg total) by mouth every 4 (four) hours as needed for itching or allergies (1-2 for bee stings).   doxycycline 100 MG tablet Commonly known as:  VIBRA-TABS Take 1 tablet (100 mg total) by mouth every 12 (twelve) hours.   EPIPEN 2-PAK 0.3 mg/0.3 mL Devi Generic drug:  EPINEPHrine Inject 0.3 mg into the muscle daily as needed (allergic reaction). For allergic reaction   irbesartan 300 MG tablet Commonly known as:  AVAPRO TAKE ONE  TABLET BY MOUTH AT BEDTIME   levothyroxine 75 MCG tablet Commonly known as:  SYNTHROID, LEVOTHROID Take 75 mcg by mouth daily.   LUBRICATING EYE DROPS OP Apply 1 drop to eye 5 (five) times daily.   potassium chloride 8 MEQ tablet Commonly known as:  KLOR-CON Take 1 tablet (8 mEq total) by mouth 2 (two) times daily.   spironolactone 25 MG tablet Commonly known as:  ALDACTONE Take 1 tablet (25 mg total) by mouth daily. Start taking on:  02/14/2018   torsemide 20 MG tablet Commonly known as:  DEMADEX Take 1 tablet (20 mg total) by mouth 2 (two) times daily. What changed:    how much to take  when to take this   umeclidinium-vilanterol 62.5-25 MCG/INH Aepb Commonly known as:  ANORO ELLIPTA Inhale 1 puff into the lungs daily.   VITAMIN C PO Take 1 tablet by mouth daily.      Follow-up Information    [name] T, PA-C Follow up on 02/24/2018.   Specialties:  Cardiology, Physician Assistant Why:  at 2:15 AM this is PA of Dr. Margurite Auerbach information: [address] [place] [address] [phone]        Plotnikov, Evie Lacks, MD Follow up in 1 week(s).   Specialty:  Internal Medicine Contact information: [place] [address] [phone]        Deboraha Sprang, MD .   Specialty:  Cardiology Contact information: [phone] [place]  [address] [phone]          Allergies  Allergen Reactions  . Bee Venom Anaphylaxis  . Amiodarone Hcl     Weakness in muscles   . Atorvastatin     unknown  . Benazepril Hcl     unknown  . Clindamycin/Lincomycin Other (See Comments)    Abdominal discomfort   . Clonidine Hydrochloride     unknown  . Colesevelam     unknown  . Diltiazem Hcl     unknown  . Ezetimibe     Unknown   . Levaquin [Levofloxacin In D5w] Other (See Comments)    Halucinations  . Verapamil Other (See Comments)    unknown  . Amoxicillin Rash    Has patient had a PCN reaction  causing immediate rash, facial/tongue/throat swelling, SOB or lightheadedness with hypotension: Yes Has patient had a PCN reaction causing severe rash involving mucus membranes or skin necrosis: No Has patient had a PCN reaction that required hospitalization Unknow Has patient had a PCN reaction occurring within the last 10 years: Unknown If all of the above answers are "NO", then may proceed with Cephalosporin use.     Consultations:  Cardiology  Procedures/Studies: Dg Chest 2 View  Result Date: 01/30/2018 CLINICAL DATA:  Short of breath over the last day, history of atrial fibrillation, former smoking history EXAM: CHEST - 2 VIEW COMPARISON:  Portable chest x-ray of 12/25/2017 FINDINGS: The previous right upper lobe pneumonia has cleared. However the heart is mildly enlarged and there are effusions as well as some indistinctness of perihilar vasculature suggesting edema and mild CHF. Permanent pacemaker wires remain. The bones are osteopenic with slight dorsal kyphosis noted. IMPRESSION: 1. Clearing of right upper lobe pneumonia. 2. However there are findings suggestive of mild CHF as noted above. Electronically Signed   By: Ivar Drape M.D.   On: 01/30/2018 16:17   Dg Chest Port 1 View  Result Date: 02/11/2018 CLINICAL DATA:  Shortness of breath, congestion EXAM: PORTABLE CHEST 1 VIEW COMPARISON:  02/09/2018 FINDINGS: Right pacer remains in place, unchanged. Improving airspace disease in the right lung. Continued diffuse airspace disease throughout the left lung, unchanged. Small bilateral pleural effusions. Heart is upper limits normal in size. IMPRESSION: Continued diffuse left lung airspace disease concerning for pneumonia. Improving aeration throughout the right lung with decreasing right lung airspace opacities. Small bilateral effusions. Electronically Signed   By: Rolm Baptise M.D.   On: 02/11/2018 11:13   Dg Chest Port 1 View  Result Date: 02/09/2018 CLINICAL DATA:  Shortness of  breath. EXAM: PORTABLE CHEST 1 VIEW COMPARISON:  01/30/2018 FINDINGS: Right chest wall pacer device noted with lead in the right atrial appendage and right ventricle. The heart size appears within normal limits. Aortic atherosclerotic calcifications identified. Bilateral pleural effusions and pulmonary edema identified compatible with CHF. Progressive airspace disease within the left upper lobe is identified compatible with pneumonia. IMPRESSION: 1. Left upper lobe pneumonia. 2. Congestive heart failure 3.  Aortic Atherosclerosis (ICD10-I70.0). Electronically Signed   By: Lovena Le  Clovis Riley M.D.   On: 02/09/2018 08:12    Echocardiogram 02/12/2018: - Left ventricle: The cavity size was normal. Wall thickness was increased in a pattern of mild LVH. Systolic function was normal. The estimated ejection fraction was in the range of 60% to 65%. Wall motion was normal; there were no regional wall motion abnormalities. The study is not technically sufficient to allow evaluation of LV diastolic function. - Aortic valve: Sclerosis without stenosis. There was no regurgitation. - Mitral valve: Mildly thickened leaflets . There was mild regurgitation. - Left atrium: The atrium was normal in size. - Right ventricle: The cavity size was mildly dilated. Wall thickness was normal. Pacer wire or catheter noted in right ventricle. Systolic function was normal. - Right atrium: Moderately dilated. Pacer wire or catheter noted in right atrium. - Tricuspid valve: There was mild regurgitation. - Pulmonary arteries: PA peak pressure: 33 mm Hg (S). - Inferior vena cava: The vessel was normal in size. The respirophasic diameter changes were in the normal range (>= 50%), consistent with normal central venous pressure.  Impressions: - LVEF 60-65%, mild LVH, normal wall motion, mild MR, normal LA size, mildy dilated RV with normal systolic function, moderate LAE, mild TR, RVSP 33 mmHg, normal  IVC, pacer wires noted.  Subjective: No dyspnea, chest pain or leg swelling. No fevers. Cough has improved.   Discharge Exam: Vitals:   02/12/18 2008 02/13/18 0335  BP: (!) 163/79 (!) 129/59  Pulse:  63  Resp:  17  Temp:  97.8 F (36.6 C)  SpO2:  93%   General: Pt is alert, awake, not in acute distress Cardiovascular: Regular paced, S1/S2 +, no rubs, no gallops Respiratory: CTA bilaterally, no wheezing, no rhonchi Abdominal: Soft, NT, ND, bowel sounds + Extremities: No edema, no cyanosis  Labs: BNP (last 3 results) Recent Labs    02/09/18 0841  BNP 938.1*   Basic Metabolic Panel: Recent Labs  Lab 02/09/18 0841 02/10/18 0306 02/11/18 0632 02/12/18 0520 02/13/18 0330  NA 139 140 139 141 138  K 4.5 3.7 3.9 3.5 3.4*  CL 95* 100* 99* 96* 97*  CO2 34* 30 30 35* 34*  GLUCOSE 132* 132* 131* 94 90  BUN 23* 26* 46* 43* 34*  CREATININE 1.10 0.88 1.13 1.00 0.92  CALCIUM 8.9 8.1* 8.5* 8.5* 8.2*   Liver Function Tests: Recent Labs  Lab 02/09/18 0841  AST 23  ALT 14*  ALKPHOS 80  BILITOT 0.9  PROT 7.2  ALBUMIN 2.9*   No results for input(s): LIPASE, AMYLASE in the last 168 hours. No results for input(s): AMMONIA in the last 168 hours. CBC: Recent Labs  Lab 02/09/18 0841 02/10/18 0306 02/11/18 0632 02/13/18 0330  WBC 11.2* 7.0 15.2* 9.5  NEUTROABS 7.6  --   --  5.5  HGB 13.7 11.1* 11.3* 11.4*  HCT 42.4 34.1* 34.8* 35.5*  MCV 104.2* 102.7* 102.1* 103.5*  PLT 464* 357 403* 382   Cardiac Enzymes: No results for input(s): CKTOTAL, CKMB, CKMBINDEX, TROPONINI in the last 168 hours. BNP: Invalid input(s): POCBNP CBG: No results for input(s): GLUCAP in the last 168 hours. D-Dimer No results for input(s): DDIMER in the last 72 hours. Hgb A1c No results for input(s): HGBA1C in the last 72 hours. Lipid Profile No results for input(s): CHOL, HDL, LDLCALC, TRIG, CHOLHDL, LDLDIRECT in the last 72 hours. Thyroid function studies No results for input(s): TSH,  T4TOTAL, T3FREE, THYROIDAB in the last 72 hours.  Invalid input(s): FREET3 Anemia work up [employer]  02/11/18 0632  VITAMINB12 1,115*  FOLATE 21.5  FERRITIN 369*  TIBC 169*  IRON 57  RETICCTPCT 2.3   Urinalysis    Component Value Date/Time   COLORURINE YELLOW 02/09/2018 0850   APPEARANCEUR CLEAR 02/09/2018 0850   LABSPEC 1.016 02/09/2018 0850   PHURINE 7.0 02/09/2018 0850   GLUCOSEU NEGATIVE 02/09/2018 0850   GLUCOSEU NEGATIVE 12/28/2013 0845   HGBUR NEGATIVE 02/09/2018 0850   BILIRUBINUR NEGATIVE 02/09/2018 0850   KETONESUR NEGATIVE 02/09/2018 0850   PROTEINUR NEGATIVE 02/09/2018 0850   UROBILINOGEN 0.2 12/28/2013 0845   NITRITE NEGATIVE 02/09/2018 0850   LEUKOCYTESUR NEGATIVE 02/09/2018 0850    Microbiology Recent Results (from the past 240 hour(s))  Blood Culture (routine x 2)     Status: None (Preliminary result)   Collection Time: 02/09/18  8:24 AM  Result Value Ref Range Status   Specimen Description BLOOD LEFT ANTECUBITAL  Final   Special Requests   Final    BOTTLES DRAWN AEROBIC AND ANAEROBIC Blood Culture results may not be optimal due to an inadequate volume of blood received in culture bottles   Culture   Final    NO GROWTH 3 DAYS Performed at [facility], [place] [address] [place], [place] [address]    Report Status PENDING  Incomplete  Culture, expectorated sputum-assessment     Status: None   Collection Time: 02/09/18  3:50 PM  Result Value Ref Range Status   Specimen Description EXPECTORATED SPUTUM  Final   Special Requests NONE  Final   Sputum evaluation   Final    THIS SPECIMEN IS ACCEPTABLE FOR SPUTUM CULTURE Performed at [facility], [place] [address], [place], [place] [address]    Report Status 02/09/2018 FINAL  Final  Culture, respiratory (NON-Expectorated)     Status: None   Collection Time: 02/09/18  3:50 PM  Result Value Ref Range Status   Specimen Description EXPECTORATED SPUTUM  Final   Special Requests NONE Reflexed  from [id number]  Final   Gram Stain   Final    MODERATE WBC PRESENT, PREDOMINANTLY PMN FEW SQUAMOUS EPITHELIAL CELLS PRESENT ABUNDANT GRAM POSITIVE COCCI IN CHAINS FEW GRAM POSITIVE COCCI IN CLUSTERS    Culture   Final    Consistent with normal respiratory flora. Performed at [facility], [place] [address], [place], [place] [address]    Report Status 02/12/2018 FINAL  Final  Blood Culture (routine x 2)     Status: None (Preliminary result)   Collection Time: 02/09/18  4:29 PM  Result Value Ref Range Status   Specimen Description BLOOD LEFT ANTECUBITAL  Final   Special Requests   Final    BOTTLES DRAWN AEROBIC ONLY Blood Culture adequate volume   Culture   Final    NO GROWTH 3 DAYS Performed at [facility], [place] [address] [place], [place] [address]    Report Status PENDING  Incomplete    Time coordinating discharge: Approximately 40 minutes  Patrecia Pour, MD  [facility] 02/13/2018, 10:14 AM Pager [phone]

## 2018-02-13 NOTE — Progress Notes (Signed)
Progress Note  Patient Name: Ricky [name] Date of Encounter: 02/13/2018  Primary Cardiologist: Virl Axe, MD   Subjective   No complaints sitting up in, no SOB no chest pain   Inpatient Medications    Scheduled Meds: . apixaban  5 mg Oral BID  . carvedilol  12.5 mg Oral BID  . doxycycline  100 mg Oral Q12H  . irbesartan  300 mg Oral QHS  . levothyroxine  75 mcg Oral QAC breakfast  . spironolactone  25 mg Oral Daily  . torsemide  40 mg Oral BID   Continuous Infusions:  PRN Meds: acetaminophen **OR** acetaminophen, albuterol, bisacodyl, ondansetron **OR** ondansetron (ZOFRAN) IV   Vital Signs    Vitals:   02/12/18 1335 02/12/18 2003 02/12/18 2008 02/13/18 0335  BP: (!) 163/74  (!) 163/79 (!) 129/59  Pulse:    63  Resp: (!) 24   17  Temp: 98.7 F (37.1 C) 97.6 F (36.4 C)  97.8 F (36.6 C)  TempSrc:    Oral  SpO2:    93%  Weight:    141 lb 1.6 oz (64 kg)  Height:        Intake/Output Summary (Last 24 hours) at 02/13/2018 0940 Last data filed at 02/13/2018 0346 Gross per 24 hour  Intake -  Output 2600 ml  Net -2600 ml   Filed Weights   02/11/18 1500 02/12/18 0424 02/13/18 0335  Weight: 145 lb 8.1 oz (66 kg) 147 lb 11.3 oz (67 kg) 141 lb 1.6 oz (64 kg)    Telemetry    AV pacing - Personally Reviewed  ECG    No new - Personally Reviewed  Physical Exam   GEN: No acute distress.   Neck: No JVD sitting up Cardiac: RRR, soft murmur, no rubs, or gallops.  Respiratory: Clear to diminished to auscultation bilaterally. GI: Soft, nontender, non-distended  MS: No edema; No deformity. Neuro:  Nonfocal  Psych: Normal affect   Labs    Chemistry Recent Labs  Lab 02/09/18 0841  02/11/18 0632 02/12/18 0520 02/13/18 0330  NA 139   < > 139 141 138  K 4.5   < > 3.9 3.5 3.4*  CL 95*   < > 99* 96* 97*  CO2 34*   < > 30 35* 34*  GLUCOSE 132*   < > 131* 94 90  BUN 23*   < > 46* 43* 34*  CREATININE 1.10   < > 1.13 1.00 0.92  CALCIUM 8.9   < > 8.5*  8.5* 8.2*  PROT 7.2  --   --   --   --   ALBUMIN 2.9*  --   --   --   --   AST 23  --   --   --   --   ALT 14*  --   --   --   --   ALKPHOS 80  --   --   --   --   BILITOT 0.9  --   --   --   --   GFRNONAA 56*   < > 54* >60 >60  GFRAA >60   < > >60 >60 >60  ANIONGAP 10   < > 10 10 7    < > = values in this interval not displayed.     Hematology Recent Labs  Lab 02/10/18 0306 02/11/18 0632 02/13/18 0330  WBC 7.0 15.2* 9.5  RBC 3.32* 3.41*  3.41* 3.43*  HGB 11.1* 11.3* 11.4*  HCT 34.1* 34.8* 35.5*  MCV 102.7* 102.1* 103.5*  MCH 33.4 33.1 33.2  MCHC 32.6 32.5 32.1  RDW 13.2 13.4 13.3  PLT 357 403* 382    Cardiac EnzymesNo results for input(s): TROPONINI in the last 168 hours.  Recent Labs  Lab 02/09/18 0900  TROPIPOC 0.03     BNP Recent Labs  Lab 02/09/18 0841  BNP 436.9*     DDimer No results for input(s): DDIMER in the last 168 hours.   Radiology    Dg Chest Port 1 View  Result Date: 02/11/2018 CLINICAL DATA:  Shortness of breath, congestion EXAM: PORTABLE CHEST 1 VIEW COMPARISON:  02/09/2018 FINDINGS: Right pacer remains in place, unchanged. Improving airspace disease in the right lung. Continued diffuse airspace disease throughout the left lung, unchanged. Small bilateral pleural effusions. Heart is upper limits normal in size. IMPRESSION: Continued diffuse left lung airspace disease concerning for pneumonia. Improving aeration throughout the right lung with decreasing right lung airspace opacities. Small bilateral effusions. Electronically Signed   By: Rolm Baptise M.D.   On: 02/11/2018 11:13    Cardiac Studies   Echo 02/12/2018  - LVEF 60-65%, mild LVH, normal wall motion, mild MR, normal LA   size, mildy dilated RV with normal systolic function, moderate   LAE, mild TR, RVSP 33 mmHg, normal IVC, pacer wires noted.  Patient Profile     84 y.o. male with history of chronic persistent atrial fibrillation s/p AV node ablation with PPM placement, HTN,  chronic diastolic CHF admitted with progressive dyspnea and hypoxic respiratory failure.Chest x-ray in the ER shows congestive heart failure with progressive left upper lobe airspace disease-PNA  as well as bilateral pleural effusions. BNP level 436. Last echocardiogram from 2014 indicated LVEF 55 to 60% range with mild to moderate left atrial enlargement, PASP 40 mmHg.   Assessment & Plan    Acute on chronic diastolic HF  Lt upper lobe PNA on CXR per IM  Hx of chronic a fib but is AV pacing hx of AV junction ablation with PPM, st jude-  Was in SR on device check --on Eliquis HTN   The patient continues to diurese on PO torsemide, he is euvolemic, I will decrease to torsemide 20 mg PO BID and continue spironolactone 25 mg po daily. He could be discharged from cardiac standpoint. Vitals and Crea stable, I will replace potassium.  Follow up outpatient appointment is scheduled.   Ena Dawley, MD 02/13/2018

## 2018-02-13 NOTE — Care Management (Signed)
Previous CM spoke with patient in regards to disposition needs. CM did call [facility] and [place] to begin within 24-48 hours post transition home. CM to fax information to [facility]. No further needs from CM at this time. Bethena Roys, RN,BSN [phone]

## 2018-02-14 ENCOUNTER — Telehealth: Payer: Self-pay | Admitting: *Deleted

## 2018-02-14 DIAGNOSIS — I10 Essential (primary) hypertension: Secondary | ICD-10-CM | POA: Diagnosis not present

## 2018-02-14 DIAGNOSIS — J189 Pneumonia, unspecified organism: Secondary | ICD-10-CM | POA: Diagnosis not present

## 2018-02-14 DIAGNOSIS — E039 Hypothyroidism, unspecified: Secondary | ICD-10-CM | POA: Diagnosis not present

## 2018-02-14 DIAGNOSIS — I482 Chronic atrial fibrillation: Secondary | ICD-10-CM | POA: Diagnosis not present

## 2018-02-14 LAB — CULTURE, BLOOD (ROUTINE X 2)
Culture: NO GROWTH
Culture: NO GROWTH
Special Requests: ADEQUATE

## 2018-02-14 NOTE — Telephone Encounter (Signed)
Called pt to verify appt that has been made for 02/18/18. Spoke w/pt and daughter Ricky [name]) she confirm appt that is w/Laura, NP. Also completed TCM call below.Ricky [name]  Transition Care Management Follow-up Telephone Call   Date discharged? 02/13/18   How have you been since you were released from the hospital? Pt states he is feeling fine   Do you understand why you were in the hospital? YES   Do you understand the discharge instructions? YES   Where were you discharged to? Home   Items Reviewed:  Medications reviewed: YES, per daughter  Allergies reviewed: YES  Dietary changes reviewed: YES  Referrals reviewed: YES   Functional Questionnaire:   Activities of Daily Living (ADLs):   She states he are independent in the following: feeding, continence, grooming and toileting States they require assistance with the following: ambulation, bathing and hygiene and dressing   Any transportation issues/concerns?: NO   Any patient concerns? NO   Confirmed importance and date/time of follow-up visits scheduled YES, appt 02/18/18  Provider Appointment booked with Jodi Mourning, NP  Confirmed with patient if condition begins to worsen call PCP or go to the ER.  Patient was given the office number and encouraged to call back with question or concerns.  : YES

## 2018-02-18 ENCOUNTER — Telehealth: Payer: Self-pay | Admitting: Internal Medicine

## 2018-02-18 ENCOUNTER — Other Ambulatory Visit (INDEPENDENT_AMBULATORY_CARE_PROVIDER_SITE_OTHER): Payer: Medicare HMO

## 2018-02-18 ENCOUNTER — Ambulatory Visit (INDEPENDENT_AMBULATORY_CARE_PROVIDER_SITE_OTHER): Payer: Medicare HMO | Admitting: Family

## 2018-02-18 ENCOUNTER — Other Ambulatory Visit: Payer: Self-pay | Admitting: Family

## 2018-02-18 ENCOUNTER — Encounter: Payer: Self-pay | Admitting: Family

## 2018-02-18 ENCOUNTER — Ambulatory Visit (INDEPENDENT_AMBULATORY_CARE_PROVIDER_SITE_OTHER)
Admission: RE | Admit: 2018-02-18 | Discharge: 2018-02-18 | Disposition: A | Discharge status: Home / Self Care | Payer: Medicare HMO | Source: Ambulatory Visit | Attending: Family | Admitting: Family

## 2018-02-18 VITALS — BP 126/62 | HR 67 | Temp 97.6°F | Ht 70.0 in | Wt 143.1 lb

## 2018-02-18 DIAGNOSIS — R9389 Abnormal findings on diagnostic imaging of other specified body structures: Secondary | ICD-10-CM

## 2018-02-18 DIAGNOSIS — I509 Heart failure, unspecified: Secondary | ICD-10-CM | POA: Diagnosis not present

## 2018-02-18 DIAGNOSIS — J189 Pneumonia, unspecified organism: Secondary | ICD-10-CM | POA: Diagnosis not present

## 2018-02-18 DIAGNOSIS — E039 Hypothyroidism, unspecified: Secondary | ICD-10-CM | POA: Diagnosis not present

## 2018-02-18 DIAGNOSIS — I482 Chronic atrial fibrillation: Secondary | ICD-10-CM | POA: Diagnosis not present

## 2018-02-18 DIAGNOSIS — I10 Essential (primary) hypertension: Secondary | ICD-10-CM | POA: Diagnosis not present

## 2018-02-18 LAB — CBC WITH DIFFERENTIAL/PLATELET
BASOS PCT: 1.3 % (ref 0.0–3.0)
Basophils Absolute: 0.1 10*3/uL (ref 0.0–0.1)
Eosinophils Absolute: 1.1 10*3/uL — ABNORMAL HIGH (ref 0.0–0.7)
Eosinophils Relative: 8.9 % — ABNORMAL HIGH (ref 0.0–5.0)
HCT: 37.3 % — ABNORMAL LOW (ref 39.0–52.0)
Hemoglobin: 12.5 g/dL — ABNORMAL LOW (ref 13.0–17.0)
LYMPHS ABS: 1.5 10*3/uL (ref 0.7–4.0)
Lymphocytes Relative: 12.6 % (ref 12.0–46.0)
MCHC: 33.5 g/dL (ref 30.0–36.0)
MCV: 100.7 fl — ABNORMAL HIGH (ref 78.0–100.0)
MONO ABS: 1.8 10*3/uL — AB (ref 0.1–1.0)
Monocytes Relative: 15 % — ABNORMAL HIGH (ref 3.0–12.0)
Neutro Abs: 7.4 10*3/uL (ref 1.4–7.7)
Neutrophils Relative %: 62.2 % (ref 43.0–77.0)
Platelets: 387 10*3/uL (ref 150.0–400.0)
RBC: 3.7 Mil/uL — ABNORMAL LOW (ref 4.22–5.81)
RDW: 14.1 % (ref 11.5–15.5)
WBC: 11.8 10*3/uL — ABNORMAL HIGH (ref 4.0–10.5)

## 2018-02-18 LAB — COMPREHENSIVE METABOLIC PANEL
ALT: 10 U/L (ref 0–53)
AST: 15 U/L (ref 0–37)
Albumin: 3.2 g/dL — ABNORMAL LOW (ref 3.5–5.2)
Alkaline Phosphatase: 79 U/L (ref 39–117)
BUN: 32 mg/dL — AB (ref 6–23)
CHLORIDE: 96 meq/L (ref 96–112)
CO2: 36 mEq/L — ABNORMAL HIGH (ref 19–32)
Calcium: 9.1 mg/dL (ref 8.4–10.5)
Creatinine, Ser: 0.9 mg/dL (ref 0.40–1.50)
GFR: 83.78 mL/min (ref >60.00)
GLUCOSE: 83 mg/dL (ref 70–99)
Potassium: 4.7 mEq/L (ref 3.5–5.1)
SODIUM: 137 meq/L (ref 135–145)
Total Bilirubin: 0.7 mg/dL (ref 0.2–1.2)
Total Protein: 6.5 g/dL (ref 6.0–8.3)

## 2018-02-18 MED ORDER — DOXYCYCLINE HYCLATE 100 MG PO TABS: 100.0000 mg | ORAL_TABLET | Freq: Two times a day (BID) | ORAL | 0 refills | Status: DC

## 2018-02-18 NOTE — Telephone Encounter (Signed)
Late Entry: Pt called this morning with concerns regarding her father's symptoms since he has been discharged from the hospital last week. He has c/o hip and leg aching, fatigue, loss of appetite, left wrist swelling. Weight is stable and she denies any weight gain since discharge. He visited his PCP today who referred him back to cardiology regarding his left wrist swelling and recent medication change of adding spironolactone and decreasing torsemide. She states she believes these symptoms are from medication side effects rather than recovery from his recent hospital admission. I encouraged her to follow up with PT and OT regarding mobility and encourage her father to rest.  I advised her to seek advice from his PCP regarding his left wrist dependant swelling as this is not indicative to a cardiology symptom.   This case has been reviewed with Dr [name] who will reach out to pt's daughter for further discussion.

## 2018-02-18 NOTE — Progress Notes (Signed)
Will extend Doxycycline x 10 full days; re-check CXR in about 2 weeks.
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
## 2018-02-18 NOTE — Telephone Encounter (Signed)
Pt's daughter calling   Stated her dad just got out of the hospital and she has some concerns

## 2018-02-19 DIAGNOSIS — J189 Pneumonia, unspecified organism: Secondary | ICD-10-CM | POA: Diagnosis not present

## 2018-02-19 DIAGNOSIS — I482 Chronic atrial fibrillation: Secondary | ICD-10-CM | POA: Diagnosis not present

## 2018-02-19 DIAGNOSIS — E039 Hypothyroidism, unspecified: Secondary | ICD-10-CM | POA: Diagnosis not present

## 2018-02-19 DIAGNOSIS — I10 Essential (primary) hypertension: Secondary | ICD-10-CM | POA: Diagnosis not present

## 2018-02-21 DIAGNOSIS — I10 Essential (primary) hypertension: Secondary | ICD-10-CM | POA: Diagnosis not present

## 2018-02-21 DIAGNOSIS — I482 Chronic atrial fibrillation: Secondary | ICD-10-CM | POA: Diagnosis not present

## 2018-02-21 DIAGNOSIS — J189 Pneumonia, unspecified organism: Secondary | ICD-10-CM | POA: Diagnosis not present

## 2018-02-21 DIAGNOSIS — E039 Hypothyroidism, unspecified: Secondary | ICD-10-CM | POA: Diagnosis not present

## 2018-02-24 ENCOUNTER — Ambulatory Visit (INDEPENDENT_AMBULATORY_CARE_PROVIDER_SITE_OTHER): Payer: Medicare HMO | Admitting: Physician Assistant

## 2018-02-24 ENCOUNTER — Encounter: Payer: Self-pay | Admitting: Physician Assistant

## 2018-02-24 VITALS — BP 108/62 | HR 65 | Ht 70.0 in | Wt 143.0 lb

## 2018-02-24 DIAGNOSIS — Z950 Presence of cardiac pacemaker: Secondary | ICD-10-CM | POA: Diagnosis not present

## 2018-02-24 DIAGNOSIS — I10 Essential (primary) hypertension: Secondary | ICD-10-CM

## 2018-02-24 DIAGNOSIS — I482 Chronic atrial fibrillation: Secondary | ICD-10-CM

## 2018-02-24 DIAGNOSIS — I5032 Chronic diastolic (congestive) heart failure: Secondary | ICD-10-CM | POA: Diagnosis not present

## 2018-02-24 DIAGNOSIS — Z8701 Personal history of pneumonia (recurrent): Secondary | ICD-10-CM | POA: Diagnosis not present

## 2018-02-24 DIAGNOSIS — I4821 Permanent atrial fibrillation: Secondary | ICD-10-CM

## 2018-02-24 MED ORDER — SPIRONOLACTONE 25 MG PO TABS: 12.5000 mg | ORAL_TABLET | Freq: Every day | ORAL | 3 refills | Status: DC

## 2018-02-24 NOTE — Patient Instructions (Signed)
Medication Instructions:  1. DECREASE SPIRONOLACTONE TO 12.5 MG DAILY ; THIS WILL BE 1/2 TABLET DAILY OF THE 25 MG TABLET  Labwork: BMET TO BE DONE WHEN YOU SEE DR. PLTONIKOV ON 03/21/18  Testing/Procedures: NONE ORDERED TODAY  Follow-Up: Nicki Reaper [facility] 04/29/18 @ 1:45   Any Other Special Instructions Will Be Listed Below (If Applicable).  CALL IF INCREASED SWELLING, INCREASED SHORTNESS OF BREATH, INCREASED WEIGHT GAIN OF 3 LB'S OR MORE IN 1 DAY OR 5 LB'S IN 1 WEEK  If you need a refill on your cardiac medications before your next appointment, please call your pharmacy.

## 2018-02-24 NOTE — Progress Notes (Signed)
Cardiology Office Note:    Date:  02/24/2018   ID:  Ricky [name], DOB Apr 16, 1923, MRN [id number]  PCP:  Cassandria Anger, MD  Cardiologist:  Virl Axe, MD   Referring MD: Cassandria Anger, MD   Chief Complaint  Patient presents with  . Hospitalization Follow-up    admitted with decompensated diastolic CHF    History of Present Illness:    Ricky [name] is a 84 y.o. male, [employer] veteran [employer]) with permanent atrial fibrillation, atrial flutter, s/p AVN ablation s/p pacemaker (after 2 failed PVI ablation procedures), hypertension, hypothyroidism, diastolic heart failure.  Cardiac Catheterization in 1997 reportedly demonstrated no obstructive disease.  He was admitted in April 2019 with pneumonia complicated by volume overload.  He was seen in follow-up by Dr. [name] 02/04/2018.  He was again volume overloaded and his diuretics were adjusted.  He had minimal improvement and was ultimately admitted 5/26-5/30 for decompensated heart failure.  He was diuresed with IV furosemide.  Chest x-ray was suggestive of recurrent pneumonia in the left upper lobe.  He was treated with antibiotics.  He was followed by cardiology.  Echocardiogram demonstrated normal LV function.  Ricky [name] returns for follow-up.  He is here with his daughter.  Since discharge, he has been doing fairly well.  His breathing remains improved.  He does have some ankle swelling that occurs throughout the day and resolves by morning.  He denies PND, chest discomfort, syncope.  He denies significant cough.  He did have some left wrist swelling and redness.  However, this is resolved.  Prior CV studies:   The following studies were reviewed today:  Echo 02/12/2018 Mild LVH, EF 60-65, normal wall motion, aortic sclerosis, mild MR, moderate RAE, mild TR, PASP 33  Echo 11/03/2012 EF 55-60, normal wall motion, mild to moderate LAE, mild RAE, PASP 40  Past Medical History:  Diagnosis Date  . ANXIETY 04/26/2008   . Atrial fibrillation ([place]) 10/31/2010   Coumadin therapy;  Echo 6/12: EF 50-55%, moderate MR, moderate TR, PASP 52, mild LAE  . BPH (benign prostatic hypertrophy)   . CAP (community acquired pneumonia) 12/2017  . CHF (congestive heart failure) ([place])   . Essential hypertension, benign 10/03/2007  . HTN (hypertension) 06/07/2013  . HYPOTHYROIDISM 09/04/2007  . LYMPHADENOPATHY 04/06/2009  . Nocturia   . Pacemaker    Status post AV nodal ablation  . TOBACCO USE, QUIT 07/14/2009   Surgical Hx: The patient  has a past surgical history that includes Pacemaker insertion (10/16/05); Colonoscopy; Eye surgery (2000); Eye surgery; Inguinal hernia repair (Right, 05/06/2013); Hernia repair; and PPM GENERATOR CHANGEOUT (N/A, 10/31/2016).   Current Medications: Current Meds  Medication Sig  . apixaban (ELIQUIS) 5 MG TABS tablet Take 1 tablet (5 mg total) by mouth 2 (two) times daily.  . Ascorbic Acid (VITAMIN C PO) Take 1 tablet by mouth daily.  . Carboxymethylcellul-Glycerin (LUBRICATING EYE DROPS OP) Apply 1 drop to eye 5 (five) times daily.  . carvedilol (COREG) 12.5 MG tablet Take 1 tablet (12.5 mg total) by mouth 2 (two) times daily.  . Cholecalciferol 1000 UNITS tablet Take 1,000 Units by mouth daily. Reported on 12/23/2015  . diphenhydrAMINE (BENADRYL) 25 mg capsule Take 1-2 capsules (25-50 mg total) by mouth every 4 (four) hours as needed for itching or allergies (1-2 for bee stings).  . doxycycline (VIBRA-TABS) 100 MG tablet Take 1 tablet (100 mg total) by mouth 2 (two) times daily.  Marland Kitchen EPINEPHrine (EPIPEN 2-PAK) 0.3 mg/0.3 mL  DEVI Inject 0.3 mg into the muscle daily as needed (allergic reaction). For allergic reaction  . irbesartan (AVAPRO) 300 MG tablet TAKE ONE TABLET BY MOUTH AT BEDTIME  . levothyroxine (SYNTHROID, LEVOTHROID) 75 MCG tablet Take 75 mcg by mouth daily.  . potassium chloride (KLOR-CON) 8 MEQ tablet Take 1 tablet (8 mEq total) by mouth 2 (two) times daily.  Marland Kitchen torsemide (DEMADEX) 20  MG tablet Take 1 tablet (20 mg total) by mouth 2 (two) times daily.  Marland Kitchen umeclidinium-vilanterol (ANORO ELLIPTA) 62.5-25 MCG/INH AEPB Inhale 1 puff into the lungs daily.  . [DISCONTINUED] spironolactone (ALDACTONE) 25 MG tablet Take 1 tablet (25 mg total) by mouth daily.     Allergies:   Bee venom; Amiodarone hcl; Atorvastatin; Benazepril hcl; Clindamycin/lincomycin; Clonidine hydrochloride; Colesevelam; Diltiazem hcl; Ezetimibe; Levaquin [levofloxacin in d5w]; Verapamil; and Amoxicillin   Social History   Tobacco Use  . Smoking status: Former Smoker    Packs/day: 1.00    Years: 30.00    Pack years: 30.00    Types: Cigarettes    Last attempt to quit: 05/01/1979    Years since quitting: 38.8  . Smokeless tobacco: Never Used  Substance Use Topics  . Alcohol use: No    Alcohol/week: 0.0 oz  . Drug use: No     Family Hx: The patient's family history includes Cancer in his mother; Heart disease in his father; Hypertension in his mother and other.  ROS:   Please see the history of present illness.    Review of Systems  Constitution: Positive for decreased appetite.   All other systems reviewed and are negative.   EKGs/Labs/Other Test Reviewed:    EKG:  EKG is  ordered today.  The ekg ordered today demonstrates AV paced, HR 65  Recent Labs: 07/30/2017: TSH 1.11 12/27/2017: Magnesium 1.9 01/30/2018: Pro B Natriuretic peptide (BNP) 501.0 02/09/2018: B Natriuretic Peptide 436.9 02/18/2018: ALT 10; BUN 32; Creatinine, Ser 0.90; Hemoglobin 12.5; Platelets 387.0; Potassium 4.7; Sodium 137   Recent Lipid Panel Lab Results  Component Value Date/Time   CHOL 210 (H) 12/28/2013 08:45 AM   TRIG 59.0 12/28/2013 08:45 AM   HDL 63.40 12/28/2013 08:45 AM   CHOLHDL 3 12/28/2013 08:45 AM   LDLCALC 135 (H) 12/28/2013 08:45 AM    Physical Exam:    VS:  BP 108/62   Pulse 65   Ht 5\' 10"  (1.778 m)   Wt 143 lb (64.9 kg)   SpO2 99%   BMI 20.52 kg/m     Wt Readings from Last 3 Encounters:    02/24/18 143 lb (64.9 kg)  02/18/18 143 lb 1.3 oz (64.9 kg)  02/13/18 141 lb 1.6 oz (64 kg)     Physical Exam  Constitutional: He is oriented to person, place, and time. He appears well-developed and well-nourished. No distress.  HENT:  Head: Normocephalic and atraumatic.  Neck: Neck supple.  Cardiovascular: Normal rate, regular rhythm, S1 normal, S2 normal and normal heart sounds.  No murmur heard. Pulmonary/Chest: Effort normal. He has no rales.  Abdominal: Soft.  Musculoskeletal: He exhibits edema (trace-1+ bilat ankle edema).  Neurological: He is alert and oriented to person, place, and time.  Skin: Skin is warm and dry.    ASSESSMENT & PLAN:    Chronic diastolic CHF (congestive heart failure) ([place]) Recent admission with decompensated heart failure.  He is feeling better with improved breathing and less swelling.  Overall, volume appears stable.  His daughter does note that he has felt worse since starting  on spironolactone.  His blood pressure is running somewhat low.  -Decrease spironolactone to 12.5 mg daily  -Continue current dose of torsemide, potassium  -BMET at follow-up with PCP 03/21/2018  -If he feels better on lower dose of spironolactone, we could consider discontinuing this altogether  History of pneumonia Antibiotics were extended out further after his appointment last week.  Continue follow-up with PCP.  Essential hypertension Blood pressure is somewhat low.  Adjust spinal lactone as noted above.  Cardiac pacemaker in situ   Follow-up with EP as planned.  Permanent Atrial Fibrillation He remains on Apixaban for anticoagulation.  His weight is > 60 kg and his SCr is < 1.5.  He should remain on 5 mg Twice daily.  Recent hemoglobin was stable.   Dispo:  Return in about 8 weeks (around 04/21/2018) for Routine Follow Up w/ Dr. [name], or [name], PA-C.   Medication Adjustments/Labs and Tests Ordered: Current medicines are reviewed at length with the patient  today.  Concerns regarding medicines are outlined above.  Tests Ordered: Orders Placed This Encounter  Procedures  . Basic Metabolic Panel (BMET)  . EKG 12-Lead   Medication Changes: Meds ordered this encounter  Medications  . spironolactone (ALDACTONE) 25 MG tablet    Sig: Take 0.5 tablets (12.5 mg total) by mouth daily.    Dispense:  45 tablet    Refill:  3    DOSE CHANGE    Signed, [name], PA-C  02/24/2018 3:55 PM    [facility] [address], [place], [place]  [address] Phone: [phone]; Fax: [phone]

## 2018-02-25 DIAGNOSIS — I5033 Acute on chronic diastolic (congestive) heart failure: Secondary | ICD-10-CM | POA: Diagnosis not present

## 2018-02-25 DIAGNOSIS — I482 Chronic atrial fibrillation: Secondary | ICD-10-CM | POA: Diagnosis not present

## 2018-02-25 DIAGNOSIS — I10 Essential (primary) hypertension: Secondary | ICD-10-CM | POA: Diagnosis not present

## 2018-02-25 DIAGNOSIS — J189 Pneumonia, unspecified organism: Secondary | ICD-10-CM | POA: Diagnosis not present

## 2018-02-25 DIAGNOSIS — E039 Hypothyroidism, unspecified: Secondary | ICD-10-CM | POA: Diagnosis not present

## 2018-02-27 DIAGNOSIS — I482 Chronic atrial fibrillation: Secondary | ICD-10-CM | POA: Diagnosis not present

## 2018-02-27 DIAGNOSIS — I5033 Acute on chronic diastolic (congestive) heart failure: Secondary | ICD-10-CM | POA: Diagnosis not present

## 2018-02-27 DIAGNOSIS — I10 Essential (primary) hypertension: Secondary | ICD-10-CM | POA: Diagnosis not present

## 2018-02-27 DIAGNOSIS — E039 Hypothyroidism, unspecified: Secondary | ICD-10-CM | POA: Diagnosis not present

## 2018-02-27 DIAGNOSIS — J189 Pneumonia, unspecified organism: Secondary | ICD-10-CM | POA: Diagnosis not present

## 2018-02-28 DIAGNOSIS — E039 Hypothyroidism, unspecified: Secondary | ICD-10-CM | POA: Diagnosis not present

## 2018-02-28 DIAGNOSIS — J189 Pneumonia, unspecified organism: Secondary | ICD-10-CM | POA: Diagnosis not present

## 2018-02-28 DIAGNOSIS — I5033 Acute on chronic diastolic (congestive) heart failure: Secondary | ICD-10-CM | POA: Diagnosis not present

## 2018-02-28 DIAGNOSIS — I482 Chronic atrial fibrillation: Secondary | ICD-10-CM | POA: Diagnosis not present

## 2018-03-03 ENCOUNTER — Ambulatory Visit (INDEPENDENT_AMBULATORY_CARE_PROVIDER_SITE_OTHER)
Admission: RE | Admit: 2018-03-03 | Discharge: 2018-03-03 | Disposition: A | Discharge status: Home / Self Care | Payer: Medicare HMO | Source: Ambulatory Visit | Attending: Family | Admitting: Family

## 2018-03-03 ENCOUNTER — Other Ambulatory Visit: Payer: Self-pay | Admitting: Family

## 2018-03-03 DIAGNOSIS — J189 Pneumonia, unspecified organism: Secondary | ICD-10-CM

## 2018-03-03 DIAGNOSIS — R9389 Abnormal findings on diagnostic imaging of other specified body structures: Secondary | ICD-10-CM | POA: Diagnosis not present

## 2018-03-03 DIAGNOSIS — J984 Other disorders of lung: Secondary | ICD-10-CM | POA: Diagnosis not present

## 2018-03-03 MED ORDER — DOXYCYCLINE HYCLATE 100 MG PO TABS: 100.0000 mg | ORAL_TABLET | Freq: Two times a day (BID) | ORAL | 0 refills | Status: DC

## 2018-03-04 DIAGNOSIS — J189 Pneumonia, unspecified organism: Secondary | ICD-10-CM | POA: Diagnosis not present

## 2018-03-04 DIAGNOSIS — I5033 Acute on chronic diastolic (congestive) heart failure: Secondary | ICD-10-CM | POA: Diagnosis not present

## 2018-03-04 DIAGNOSIS — E039 Hypothyroidism, unspecified: Secondary | ICD-10-CM | POA: Diagnosis not present

## 2018-03-04 DIAGNOSIS — I482 Chronic atrial fibrillation: Secondary | ICD-10-CM | POA: Diagnosis not present

## 2018-03-06 DIAGNOSIS — I5033 Acute on chronic diastolic (congestive) heart failure: Secondary | ICD-10-CM | POA: Diagnosis not present

## 2018-03-06 DIAGNOSIS — I482 Chronic atrial fibrillation: Secondary | ICD-10-CM | POA: Diagnosis not present

## 2018-03-06 DIAGNOSIS — E039 Hypothyroidism, unspecified: Secondary | ICD-10-CM | POA: Diagnosis not present

## 2018-03-06 DIAGNOSIS — J189 Pneumonia, unspecified organism: Secondary | ICD-10-CM | POA: Diagnosis not present

## 2018-03-07 DIAGNOSIS — J189 Pneumonia, unspecified organism: Secondary | ICD-10-CM | POA: Diagnosis not present

## 2018-03-07 DIAGNOSIS — I5033 Acute on chronic diastolic (congestive) heart failure: Secondary | ICD-10-CM | POA: Diagnosis not present

## 2018-03-07 DIAGNOSIS — I482 Chronic atrial fibrillation: Secondary | ICD-10-CM | POA: Diagnosis not present

## 2018-03-07 DIAGNOSIS — E039 Hypothyroidism, unspecified: Secondary | ICD-10-CM | POA: Diagnosis not present

## 2018-03-10 ENCOUNTER — Ambulatory Visit (INDEPENDENT_AMBULATORY_CARE_PROVIDER_SITE_OTHER)
Admission: RE | Admit: 2018-03-10 | Discharge: 2018-03-10 | Disposition: A | Discharge status: Home / Self Care | Payer: Medicare HMO | Source: Ambulatory Visit | Attending: Family | Admitting: Family

## 2018-03-10 DIAGNOSIS — J189 Pneumonia, unspecified organism: Secondary | ICD-10-CM

## 2018-03-10 DIAGNOSIS — I10 Essential (primary) hypertension: Secondary | ICD-10-CM | POA: Diagnosis not present

## 2018-03-10 DIAGNOSIS — I5033 Acute on chronic diastolic (congestive) heart failure: Secondary | ICD-10-CM | POA: Diagnosis not present

## 2018-03-10 DIAGNOSIS — E039 Hypothyroidism, unspecified: Secondary | ICD-10-CM | POA: Diagnosis not present

## 2018-03-10 DIAGNOSIS — I482 Chronic atrial fibrillation: Secondary | ICD-10-CM | POA: Diagnosis not present

## 2018-03-11 ENCOUNTER — Telehealth: Payer: Self-pay | Admitting: Internal Medicine

## 2018-03-11 ENCOUNTER — Telehealth: Payer: Self-pay | Admitting: *Deleted

## 2018-03-11 DIAGNOSIS — I509 Heart failure, unspecified: Secondary | ICD-10-CM

## 2018-03-11 DIAGNOSIS — J189 Pneumonia, unspecified organism: Secondary | ICD-10-CM | POA: Diagnosis not present

## 2018-03-11 DIAGNOSIS — E039 Hypothyroidism, unspecified: Secondary | ICD-10-CM | POA: Diagnosis not present

## 2018-03-11 DIAGNOSIS — I482 Chronic atrial fibrillation: Secondary | ICD-10-CM | POA: Diagnosis not present

## 2018-03-11 DIAGNOSIS — I5033 Acute on chronic diastolic (congestive) heart failure: Secondary | ICD-10-CM | POA: Diagnosis not present

## 2018-03-11 NOTE — Telephone Encounter (Signed)
LVM with pt's daughter with direct number to call back.

## 2018-03-11 NOTE — Telephone Encounter (Signed)
-----   Message from Liliane Shi, [place] sent at 03/11/2018  4:42 PM EDT ----- CXR done by PCP sent to me. Small bilateral effusions may be chronic. Call patient and ask if he is having any increased swelling, worsening shortness of breath or weight gain (3 or more lbs since last seen).  - If yes to any of these questions:       1. Take Torsemide 40 mg in A and 20 mg in P x 3 days, then resume 20 mg bid       2. Take K+ 16 mEq in A and 8 mEq in P x 3 days, then resume 8 mEq bid       3. Follow up 2 weeks.  - If patient doing well and no changes in symptoms, continue current medications and follow up as planned.  [name], PA-C    03/11/2018 4:39 PM

## 2018-03-11 NOTE — Telephone Encounter (Signed)
I called the pt who gave me verbal permission ok to s/w his daughter Ricky [name]. I will note this in the chart as the recent DPR states to s/w pt only. I asked pt's daughter Ricky [name] how has the pt been doing/feeling. Any increased sob, edema or wt gain. Ricky [name] answered that her dad is feeling somewhat better. Ricky [name] states pt's wt has been stable maybe with a 1/2 lb increase, denies any worsening sob any increase in wt gain. I explained the CXR does show some fluid. I did also go over recommendations from [name], [place] when she tells me she s/w [name], Dr. [name] nurse today. I reviewed the chart and have pasted in phone note with Dr. [name] recommendations as he reviewed the CXR today as well. I confirmed the instructions with Ricky [name] that Dr. [name] has given. I advised at this time let's follow the instructions given by Dr. [name] and I will inform [name], PA of the instructions given by Dr. [name]. Pt's daughter Ricky [name] me for the call and how attentive our office is. See phone note below:   [name], RN  03/11/18 2:46 PM  Note    Spoke with pt's daughter about her concerns with pt's recent CXR. Per Dr [name], pt's pleural effusions look slightly worse than previous CXR. He would like for pt to increase his Torsemide to 40mg  bid every other day for 2 weeks. In addition, take his Torsemide, 40mg , all together rather than 20mg  bid. Pt is to have repeat CXR on the week of July 7. Pt is to call the office if symptoms worsen with increase in torsemide. Pt's daughter agrees to plan and had no additional questions.

## 2018-03-11 NOTE — Telephone Encounter (Signed)
New Message:      Pt's daughter Festus Holts states the pt went to do a f/u chest x-ray on yesterday. The readings states no pneumonia but some fluid build up and was told to contact us right away to see what needs to be done about the fluid

## 2018-03-11 NOTE — Telephone Encounter (Signed)
Spoke with pt's daughter about her concerns with pt's recent CXR. Per Dr [name], pt's pleural effusions look slightly worse than previous CXR. He would like for pt to increase his Torsemide to 40mg  bid every other day for 2 weeks. In addition, take his Torsemide, 40mg , all together rather than 20mg  bid. Pt is to have repeat CXR on the week of July 7. Pt is to call the office if symptoms worsen with increase in torsemide. Pt's daughter agrees to plan and had no additional questions.

## 2018-03-13 ENCOUNTER — Telehealth: Payer: Self-pay | Admitting: Internal Medicine

## 2018-03-13 NOTE — Telephone Encounter (Signed)
Copied from [place] [phone]. Topic: Quick Communication - Rx Refill/Question >> Mar 13, 2018 11:46 AM Waylan Rocher, [place] wrote: Medication: carvedilol (COREG) 12.5 MG tablet !  Has the patient contacted their pharmacy? Yes.   (Agent: If no, request that the patient contact the pharmacy for the refill.) (Agent: If yes, when and what did the pharmacy advise?)  Preferred Pharmacy (with phone number or street name):  [facility] ([address] [place] [address]  [address] [place]) [place] [address]  Phone: [phone] Fax: [phone]     Agent: Please be advised that RX refills may take up to 3 business days. We ask that you follow-up with your pharmacy.

## 2018-03-14 DIAGNOSIS — E039 Hypothyroidism, unspecified: Secondary | ICD-10-CM | POA: Diagnosis not present

## 2018-03-14 DIAGNOSIS — I5033 Acute on chronic diastolic (congestive) heart failure: Secondary | ICD-10-CM | POA: Diagnosis not present

## 2018-03-14 DIAGNOSIS — J189 Pneumonia, unspecified organism: Secondary | ICD-10-CM | POA: Diagnosis not present

## 2018-03-14 DIAGNOSIS — I482 Chronic atrial fibrillation: Secondary | ICD-10-CM | POA: Diagnosis not present

## 2018-03-14 MED ORDER — CARVEDILOL 12.5 MG PO TABS: 12.5000 mg | ORAL_TABLET | Freq: Two times a day (BID) | ORAL | 3 refills | Status: DC

## 2018-03-14 NOTE — Telephone Encounter (Signed)
Refill of Coreg by historical provider  LOV 02/04/18  Dr. Alain Marion  LRF 02/13/18   #60  0 refills  [facility] ([facility] [place] [facility] [place]  [address] [place] [address]  Phone: [phone] Fax: [phone]

## 2018-03-14 NOTE — Telephone Encounter (Signed)
Reviewed chart pt is up-to-date sent refills to [facility].../lmb  

## 2018-03-18 DIAGNOSIS — I482 Chronic atrial fibrillation: Secondary | ICD-10-CM | POA: Diagnosis not present

## 2018-03-18 DIAGNOSIS — E039 Hypothyroidism, unspecified: Secondary | ICD-10-CM | POA: Diagnosis not present

## 2018-03-18 DIAGNOSIS — I5033 Acute on chronic diastolic (congestive) heart failure: Secondary | ICD-10-CM | POA: Diagnosis not present

## 2018-03-18 DIAGNOSIS — J189 Pneumonia, unspecified organism: Secondary | ICD-10-CM | POA: Diagnosis not present

## 2018-03-21 ENCOUNTER — Encounter: Payer: Self-pay | Admitting: Internal Medicine

## 2018-03-21 ENCOUNTER — Ambulatory Visit (INDEPENDENT_AMBULATORY_CARE_PROVIDER_SITE_OTHER): Payer: Medicare HMO | Admitting: Internal Medicine

## 2018-03-21 ENCOUNTER — Other Ambulatory Visit (INDEPENDENT_AMBULATORY_CARE_PROVIDER_SITE_OTHER): Payer: Medicare HMO

## 2018-03-21 VITALS — BP 154/80 | HR 65 | Ht 70.0 in | Wt 145.0 lb

## 2018-03-21 DIAGNOSIS — E039 Hypothyroidism, unspecified: Secondary | ICD-10-CM | POA: Diagnosis not present

## 2018-03-21 DIAGNOSIS — I5032 Chronic diastolic (congestive) heart failure: Secondary | ICD-10-CM

## 2018-03-21 DIAGNOSIS — Z209 Contact with and (suspected) exposure to unspecified communicable disease: Secondary | ICD-10-CM | POA: Diagnosis not present

## 2018-03-21 DIAGNOSIS — I5033 Acute on chronic diastolic (congestive) heart failure: Secondary | ICD-10-CM | POA: Diagnosis not present

## 2018-03-21 DIAGNOSIS — J189 Pneumonia, unspecified organism: Secondary | ICD-10-CM | POA: Diagnosis not present

## 2018-03-21 DIAGNOSIS — I10 Essential (primary) hypertension: Secondary | ICD-10-CM | POA: Diagnosis not present

## 2018-03-21 DIAGNOSIS — I482 Chronic atrial fibrillation: Secondary | ICD-10-CM | POA: Diagnosis not present

## 2018-03-21 LAB — CBC WITH DIFFERENTIAL/PLATELET
BASOS PCT: 0.3 % (ref 0.0–3.0)
Basophils Absolute: 0 10*3/uL (ref 0.0–0.1)
EOS PCT: 4.4 % (ref 0.0–5.0)
Eosinophils Absolute: 0.3 10*3/uL (ref 0.0–0.7)
HEMATOCRIT: 35 % — AB (ref 39.0–52.0)
HEMOGLOBIN: 11.8 g/dL — AB (ref 13.0–17.0)
Lymphocytes Relative: 24.1 % (ref 12.0–46.0)
Lymphs Abs: 1.7 10*3/uL (ref 0.7–4.0)
MCHC: 33.6 g/dL (ref 30.0–36.0)
MCV: 100.6 fl — ABNORMAL HIGH (ref 78.0–100.0)
MONO ABS: 0.7 10*3/uL (ref 0.1–1.0)
Monocytes Relative: 9.6 % (ref 3.0–12.0)
Neutro Abs: 4.4 10*3/uL (ref 1.4–7.7)
Neutrophils Relative %: 61.6 % (ref 43.0–77.0)
Platelets: 360 10*3/uL (ref 150.0–400.0)
RBC: 3.48 Mil/uL — ABNORMAL LOW (ref 4.22–5.81)
RDW: 15 % (ref 11.5–15.5)
WBC: 7.1 10*3/uL (ref 4.0–10.5)

## 2018-03-21 LAB — BASIC METABOLIC PANEL
BUN: 33 mg/dL — AB (ref 6–23)
CO2: 33 mEq/L — ABNORMAL HIGH (ref 19–32)
Calcium: 8.8 mg/dL (ref 8.4–10.5)
Chloride: 97 mEq/L (ref 96–112)
Creatinine, Ser: 0.96 mg/dL (ref 0.40–1.50)
GFR: 77.75 mL/min (ref >60.00)
GLUCOSE: 95 mg/dL (ref 70–99)
Potassium: 4.5 mEq/L (ref 3.5–5.1)
Sodium: 137 mEq/L (ref 135–145)

## 2018-03-21 MED ORDER — ZOSTER VAC RECOMB ADJUVANTED 50 MCG/0.5ML IM SUSR: 0.5000 mL | Freq: Once | INTRAMUSCULAR | 1 refills | Status: AC

## 2018-03-21 NOTE — Assessment & Plan Note (Signed)
Coreg, Demadex, Losartan 

## 2018-03-21 NOTE — Progress Notes (Signed)
Subjective:  Patient ID: Ricky [name], male    DOB: 04-Feb-1923  Age: 84 y.o. MRN: [id number]  CC: No chief complaint on file.   HPI CALVYN KURTZMAN presents for weakness, CHF, HTN f/u He wants to do a measles Ab blood test  Outpatient Medications Prior to Visit  Medication Sig Dispense Refill  . apixaban (ELIQUIS) 5 MG TABS tablet Take 1 tablet (5 mg total) by mouth 2 (two) times daily. 180 tablet 3  . Ascorbic Acid (VITAMIN C PO) Take 1 tablet by mouth daily.    . Carboxymethylcellul-Glycerin (LUBRICATING EYE DROPS OP) Apply 1 drop to eye 5 (five) times daily.    . carvedilol (COREG) 12.5 MG tablet Take 1 tablet (12.5 mg total) by mouth 2 (two) times daily. 60 tablet 3  . Cholecalciferol 1000 UNITS tablet Take 1,000 Units by mouth daily. Reported on 12/23/2015    . EPINEPHrine (EPIPEN 2-PAK) 0.3 mg/0.3 mL DEVI Inject 0.3 mg into the muscle daily as needed (allergic reaction). For allergic reaction    . irbesartan (AVAPRO) 300 MG tablet TAKE ONE TABLET BY MOUTH AT BEDTIME 90 tablet 1  . levothyroxine (SYNTHROID, LEVOTHROID) 75 MCG tablet Take 75 mcg by mouth daily.    . potassium chloride (KLOR-CON) 8 MEQ tablet Take 1 tablet (8 mEq total) by mouth 2 (two) times daily. 180 tablet 3  . spironolactone (ALDACTONE) 25 MG tablet Take 0.5 tablets (12.5 mg total) by mouth daily. 45 tablet 3  . torsemide (DEMADEX) 20 MG tablet Take 1 tablet (20 mg total) by mouth 2 (two) times daily. 60 tablet 0  . umeclidinium-vilanterol (ANORO ELLIPTA) 62.5-25 MCG/INH AEPB Inhale 1 puff into the lungs daily. 1 each 5  . diphenhydrAMINE (BENADRYL) 25 mg capsule Take 1-2 capsules (25-50 mg total) by mouth every 4 (four) hours as needed for itching or allergies (1-2 for bee stings). 60 capsule 2  . doxycycline (VIBRA-TABS) 100 MG tablet Take 1 tablet (100 mg total) by mouth 2 (two) times daily. (Patient not taking: Reported on 03/21/2018) 10 tablet 0   No facility-administered medications prior to visit.      ROS: Review of Systems  Constitutional: Positive for fatigue. Negative for appetite change and unexpected weight change.  HENT: Negative for congestion, nosebleeds, sneezing, sore throat and trouble swallowing.   Eyes: Positive for photophobia and visual disturbance. Negative for itching.  Respiratory: Negative for cough.   Cardiovascular: Negative for chest pain, palpitations and leg swelling.  Gastrointestinal: Negative for abdominal distention, blood in stool, diarrhea and nausea.  Genitourinary: Negative for frequency and hematuria.  Musculoskeletal: Positive for gait problem. Negative for back pain, joint swelling and neck pain.  Skin: Negative for rash.  Neurological: Positive for weakness. Negative for dizziness, tremors and speech difficulty.  Psychiatric/Behavioral: Negative for agitation, dysphoric mood and sleep disturbance. The patient is not nervous/anxious.     Objective:  BP (!) 154/80 (BP Location: Left Arm, Patient Position: Sitting, Cuff Size: Normal)   Pulse 65   Ht 5\' 10"  (1.778 m)   Wt 145 lb (65.8 kg)   SpO2 99%   BMI 20.81 kg/m   BP Readings from Last 3 Encounters:  03/21/18 (!) 154/80  02/24/18 108/62  02/18/18 126/62    Wt Readings from Last 3 Encounters:  03/21/18 145 lb (65.8 kg)  02/24/18 143 lb (64.9 kg)  02/18/18 143 lb 1.3 oz (64.9 kg)    Physical Exam  Constitutional: He is oriented to person, place, and time. He appears  well-developed. No distress.  NAD  HENT:  Mouth/Throat: Oropharynx is clear and moist.  Eyes: Pupils are equal, round, and reactive to light. Conjunctivae are normal.  Neck: Normal range of motion. No JVD present. No thyromegaly present.  Cardiovascular: Normal rate, regular rhythm, normal heart sounds and intact distal pulses. Exam reveals no gallop and no friction rub.  No murmur heard. Pulmonary/Chest: Effort normal and breath sounds normal. No respiratory distress. He has no wheezes. He has no rales. He exhibits  no tenderness.  Abdominal: Soft. Bowel sounds are normal. He exhibits no distension and no mass. There is no tenderness. There is no rebound and no guarding.  Musculoskeletal: Normal range of motion. He exhibits no edema or tenderness.  Lymphadenopathy:    He has no cervical adenopathy.  Neurological: He is alert and oriented to person, place, and time. He has normal reflexes. No cranial nerve deficit. He exhibits normal muscle tone. He displays a negative Romberg sign. Coordination abnormal. Gait normal.  Skin: Skin is warm and dry. No rash noted.  Psychiatric: He has a normal mood and affect. His behavior is normal. Judgment and thought content normal.   Walker  Lab Results  Component Value Date   WBC 11.8 (H) 02/18/2018   HGB 12.5 (L) 02/18/2018   HCT 37.3 (L) 02/18/2018   PLT 387.0 02/18/2018   GLUCOSE 83 02/18/2018   CHOL 210 (H) 12/28/2013   TRIG 59.0 12/28/2013   HDL 63.40 12/28/2013   LDLCALC 135 (H) 12/28/2013   ALT 10 02/18/2018   AST 15 02/18/2018   NA 137 02/18/2018   K 4.7 02/18/2018   CL 96 02/18/2018   CREATININE 0.90 02/18/2018   BUN 32 (H) 02/18/2018   CO2 36 (H) 02/18/2018   TSH 1.11 07/30/2017   PSA 0.84 12/28/2013   INR 1.6 (H) 10/17/2016    Dg Chest 2 View  Result Date: 03/10/2018 CLINICAL DATA:  Pneumonia. EXAM: CHEST - 2 VIEW COMPARISON:  Radiograph March 03, 2018. FINDINGS: Stable cardiomediastinal silhouette. Right-sided pacemaker is unchanged in position. Atherosclerosis of thoracic aorta is noted. No pneumothorax is noted. Small bilateral pleural effusions are noted. Stable left upper lobe opacities are noted consistent with residual inflammation or postinfectious scarring. Stable right upper lobe density is noted consistent with scarring. Hyperexpansion of the lungs is noted. Bony thorax is unremarkable. IMPRESSION: Hyperexpansion of the lungs consistent with chronic obstructive pulmonary disease. Small bilateral pleural effusions. Stable left upper  lobe opacities are noted consistent with residual inflammation or postinfectious scarring. Aortic Atherosclerosis (ICD10-I70.0). Electronically Signed   By: Marijo Conception, M.D.   On: 03/10/2018 08:53    Assessment & Plan:   There are no diagnoses linked to this encounter.   No orders of the defined types were placed in this encounter.    Follow-up: No follow-ups on file.  Walker Kehr, MD

## 2018-03-21 NOTE — Assessment & Plan Note (Signed)
Aldactone, Torsemide Avapro, Coreg

## 2018-03-24 LAB — RUBEOLA ANTIBODY IGG

## 2018-03-25 ENCOUNTER — Telehealth: Payer: Self-pay

## 2018-03-25 ENCOUNTER — Ambulatory Visit
Admission: RE | Admit: 2018-03-25 | Discharge: 2018-03-25 | Disposition: A | Discharge status: Home / Self Care | Payer: Medicare HMO | Source: Ambulatory Visit | Attending: Internal Medicine | Admitting: Internal Medicine

## 2018-03-25 DIAGNOSIS — E039 Hypothyroidism, unspecified: Secondary | ICD-10-CM | POA: Diagnosis not present

## 2018-03-25 DIAGNOSIS — I5033 Acute on chronic diastolic (congestive) heart failure: Secondary | ICD-10-CM | POA: Diagnosis not present

## 2018-03-25 DIAGNOSIS — I482 Chronic atrial fibrillation: Secondary | ICD-10-CM | POA: Diagnosis not present

## 2018-03-25 DIAGNOSIS — J189 Pneumonia, unspecified organism: Secondary | ICD-10-CM | POA: Diagnosis not present

## 2018-03-25 DIAGNOSIS — I509 Heart failure, unspecified: Secondary | ICD-10-CM | POA: Diagnosis not present

## 2018-03-25 NOTE — Telephone Encounter (Signed)
Labs mailed to pt  Copied from [place] [id number]. Topic: Quick Communication - Lab Results >> Mar 25, 2018  9:26 AM Scherrie Gerlach wrote: Pt would like a copy of his last lab results mailed to his home.  Pt does not have access to mychart >> Mar 25, 2018  9:29 AM [place], Isidoro Donning wrote: Is it okay to mail these to him? Please advise.

## 2018-03-26 ENCOUNTER — Telehealth: Payer: Self-pay | Admitting: Internal Medicine

## 2018-03-26 DIAGNOSIS — R9389 Abnormal findings on diagnostic imaging of other specified body structures: Secondary | ICD-10-CM

## 2018-03-26 NOTE — Telephone Encounter (Signed)
New Message   Pt's daughter is calling wanting to know the results of her fathers chest xray and the next steps that they need to take. Please call

## 2018-03-27 DIAGNOSIS — I5033 Acute on chronic diastolic (congestive) heart failure: Secondary | ICD-10-CM | POA: Diagnosis not present

## 2018-03-27 DIAGNOSIS — I482 Chronic atrial fibrillation: Secondary | ICD-10-CM | POA: Diagnosis not present

## 2018-03-27 DIAGNOSIS — E039 Hypothyroidism, unspecified: Secondary | ICD-10-CM | POA: Diagnosis not present

## 2018-03-27 DIAGNOSIS — J189 Pneumonia, unspecified organism: Secondary | ICD-10-CM | POA: Diagnosis not present

## 2018-03-27 NOTE — Telephone Encounter (Signed)
Follow up     Daughter is calling back to see if Dr. [name] has review x-rays. Please call.

## 2018-03-27 NOTE — Telephone Encounter (Signed)
Spoke with pt's daughter regarding her father's recent CXR. Per Dr [name], pt's fluid status looks improved. However according to the radiology report, there are areas in the lung that could suggest emphysema which has not been noted before. Dr [name] would like for pt to follow up with a pulmonologist.   Pt's daughter also wanted to be sure her father could continue to take his full dose of Torsemide in the AM vs 20mg  in the AM and 20mg  in the PM. I told her if this dosing has been working for him and he has not shown signs of fluid overload, it would be okay for him to continue taking it this way.  Lastly, a referral for pulmonology has been placed since pt did not have an established pulmonologist.

## 2018-03-28 DIAGNOSIS — I482 Chronic atrial fibrillation: Secondary | ICD-10-CM | POA: Diagnosis not present

## 2018-03-28 DIAGNOSIS — I5033 Acute on chronic diastolic (congestive) heart failure: Secondary | ICD-10-CM | POA: Diagnosis not present

## 2018-03-28 DIAGNOSIS — E039 Hypothyroidism, unspecified: Secondary | ICD-10-CM | POA: Diagnosis not present

## 2018-03-28 DIAGNOSIS — J189 Pneumonia, unspecified organism: Secondary | ICD-10-CM | POA: Diagnosis not present

## 2018-03-28 NOTE — Telephone Encounter (Signed)
noted 

## 2018-03-31 DIAGNOSIS — I5033 Acute on chronic diastolic (congestive) heart failure: Secondary | ICD-10-CM | POA: Diagnosis not present

## 2018-03-31 DIAGNOSIS — I482 Chronic atrial fibrillation: Secondary | ICD-10-CM | POA: Diagnosis not present

## 2018-03-31 DIAGNOSIS — E039 Hypothyroidism, unspecified: Secondary | ICD-10-CM | POA: Diagnosis not present

## 2018-03-31 DIAGNOSIS — J189 Pneumonia, unspecified organism: Secondary | ICD-10-CM | POA: Diagnosis not present

## 2018-04-01 DIAGNOSIS — J189 Pneumonia, unspecified organism: Secondary | ICD-10-CM | POA: Diagnosis not present

## 2018-04-01 DIAGNOSIS — E039 Hypothyroidism, unspecified: Secondary | ICD-10-CM | POA: Diagnosis not present

## 2018-04-01 DIAGNOSIS — I5033 Acute on chronic diastolic (congestive) heart failure: Secondary | ICD-10-CM | POA: Diagnosis not present

## 2018-04-01 DIAGNOSIS — I482 Chronic atrial fibrillation: Secondary | ICD-10-CM | POA: Diagnosis not present

## 2018-04-02 DIAGNOSIS — Z950 Presence of cardiac pacemaker: Secondary | ICD-10-CM | POA: Diagnosis not present

## 2018-04-02 DIAGNOSIS — Z809 Family history of malignant neoplasm, unspecified: Secondary | ICD-10-CM | POA: Diagnosis not present

## 2018-04-02 DIAGNOSIS — Z85828 Personal history of other malignant neoplasm of skin: Secondary | ICD-10-CM | POA: Diagnosis not present

## 2018-04-02 DIAGNOSIS — Z87891 Personal history of nicotine dependence: Secondary | ICD-10-CM | POA: Diagnosis not present

## 2018-04-02 DIAGNOSIS — R030 Elevated blood-pressure reading, without diagnosis of hypertension: Secondary | ICD-10-CM | POA: Diagnosis not present

## 2018-04-03 DIAGNOSIS — J189 Pneumonia, unspecified organism: Secondary | ICD-10-CM | POA: Diagnosis not present

## 2018-04-03 DIAGNOSIS — I482 Chronic atrial fibrillation: Secondary | ICD-10-CM | POA: Diagnosis not present

## 2018-04-03 DIAGNOSIS — I5033 Acute on chronic diastolic (congestive) heart failure: Secondary | ICD-10-CM | POA: Diagnosis not present

## 2018-04-03 DIAGNOSIS — E039 Hypothyroidism, unspecified: Secondary | ICD-10-CM | POA: Diagnosis not present

## 2018-04-07 DIAGNOSIS — I482 Chronic atrial fibrillation: Secondary | ICD-10-CM | POA: Diagnosis not present

## 2018-04-07 DIAGNOSIS — J189 Pneumonia, unspecified organism: Secondary | ICD-10-CM | POA: Diagnosis not present

## 2018-04-07 DIAGNOSIS — I5033 Acute on chronic diastolic (congestive) heart failure: Secondary | ICD-10-CM | POA: Diagnosis not present

## 2018-04-07 DIAGNOSIS — E039 Hypothyroidism, unspecified: Secondary | ICD-10-CM | POA: Diagnosis not present

## 2018-04-08 ENCOUNTER — Encounter: Payer: Self-pay | Admitting: Internal Medicine

## 2018-04-08 ENCOUNTER — Ambulatory Visit (INDEPENDENT_AMBULATORY_CARE_PROVIDER_SITE_OTHER): Payer: Medicare HMO | Admitting: Internal Medicine

## 2018-04-08 VITALS — BP 140/82 | HR 65 | Temp 98.2°F

## 2018-04-08 DIAGNOSIS — S41101A Unspecified open wound of right upper arm, initial encounter: Secondary | ICD-10-CM

## 2018-04-08 DIAGNOSIS — E039 Hypothyroidism, unspecified: Secondary | ICD-10-CM | POA: Diagnosis not present

## 2018-04-08 DIAGNOSIS — J189 Pneumonia, unspecified organism: Secondary | ICD-10-CM | POA: Diagnosis not present

## 2018-04-08 DIAGNOSIS — I5032 Chronic diastolic (congestive) heart failure: Secondary | ICD-10-CM | POA: Diagnosis not present

## 2018-04-08 DIAGNOSIS — I482 Chronic atrial fibrillation: Secondary | ICD-10-CM | POA: Diagnosis not present

## 2018-04-08 DIAGNOSIS — I5033 Acute on chronic diastolic (congestive) heart failure: Secondary | ICD-10-CM | POA: Diagnosis not present

## 2018-04-08 MED ORDER — SILVER SULFADIAZINE 1 % EX CREA: 1.0000 "application " | TOPICAL_CREAM | Freq: Every day | CUTANEOUS | 0 refills | Status: DC

## 2018-04-08 NOTE — Assessment & Plan Note (Signed)
R post forearm large V shaped wound

## 2018-04-08 NOTE — Patient Instructions (Addendum)
Procedure note:   Wound care instructions : change dressing once a day or twice a day is needed. Change dressing after  shower in the morning or wash with liquid soap and water.  Pat dry the wound with gauze. Re-dress wound with antibiotic ointment and Telfa pad    Please contact us if you notice increased swelling or pain in the area.

## 2018-04-08 NOTE — Progress Notes (Signed)
Subjective:  Patient ID: Ricky [name], male    DOB: [birth year]  Age: 84 y.o. MRN: [id number]  CC: No chief complaint on file.   HPI BEAR OSTEN presents for R posterior forearm abrasion - he hurt himself 1 week ago C/o leg edema, wt gain. F/u CHF  Outpatient Medications Prior to Visit  Medication Sig Dispense Refill  . apixaban (ELIQUIS) 5 MG TABS tablet Take 1 tablet (5 mg total) by mouth 2 (two) times daily. 180 tablet 3  . Ascorbic Acid (VITAMIN C PO) Take 1 tablet by mouth daily.    . Carboxymethylcellul-Glycerin (LUBRICATING EYE DROPS OP) Apply 1 drop to eye 5 (five) times daily.    . carvedilol (COREG) 12.5 MG tablet Take 1 tablet (12.5 mg total) by mouth 2 (two) times daily. 60 tablet 3  . Cholecalciferol 1000 UNITS tablet Take 1,000 Units by mouth daily. Reported on 12/23/2015    . EPINEPHrine (EPIPEN 2-PAK) 0.3 mg/0.3 mL DEVI Inject 0.3 mg into the muscle daily as needed (allergic reaction). For allergic reaction    . irbesartan (AVAPRO) 300 MG tablet TAKE ONE TABLET BY MOUTH AT BEDTIME 90 tablet 1  . levothyroxine (SYNTHROID, LEVOTHROID) 75 MCG tablet Take 75 mcg by mouth daily.    . potassium chloride (KLOR-CON) 8 MEQ tablet Take 1 tablet (8 mEq total) by mouth 2 (two) times daily. 180 tablet 3  . spironolactone (ALDACTONE) 25 MG tablet Take 0.5 tablets (12.5 mg total) by mouth daily. 45 tablet 3  . torsemide (DEMADEX) 20 MG tablet Take 1 tablet (20 mg total) by mouth 2 (two) times daily. 60 tablet 0  . umeclidinium-vilanterol (ANORO ELLIPTA) 62.5-25 MCG/INH AEPB Inhale 1 puff into the lungs daily. 1 each 5  . diphenhydrAMINE (BENADRYL) 25 mg capsule Take 1-2 capsules (25-50 mg total) by mouth every 4 (four) hours as needed for itching or allergies (1-2 for bee stings). 60 capsule 2   No facility-administered medications prior to visit.     ROS: Review of Systems  Constitutional: Positive for unexpected weight change. Negative for appetite change and fatigue.    HENT: Negative for congestion, nosebleeds, sneezing, sore throat and trouble swallowing.   Eyes: Negative for itching and visual disturbance.  Respiratory: Negative for cough.   Cardiovascular: Positive for leg swelling. Negative for chest pain and palpitations.  Gastrointestinal: Negative for abdominal distention, blood in stool, diarrhea and nausea.  Genitourinary: Negative for frequency and hematuria.  Musculoskeletal: Positive for gait problem. Negative for back pain, joint swelling and neck pain.  Skin: Positive for wound. Negative for rash.  Neurological: Positive for weakness. Negative for dizziness, tremors and speech difficulty.  Psychiatric/Behavioral: Negative for agitation, dysphoric mood and sleep disturbance. The patient is not nervous/anxious.     Objective:  BP 140/82 (BP Location: Left Arm, Patient Position: Sitting, Cuff Size: Normal)   Pulse 65   Temp 98.2 F (36.8 C) (Oral)   SpO2 95%   BP Readings from Last 3 Encounters:  04/08/18 140/82  03/21/18 (!) 154/80  02/24/18 108/62    Wt Readings from Last 3 Encounters:  03/21/18 145 lb (65.8 kg)  02/24/18 143 lb (64.9 kg)  02/18/18 143 lb 1.3 oz (64.9 kg)    Physical Exam  Constitutional: He is oriented to person, place, and time. He appears well-developed. No distress.  NAD  HENT:  Mouth/Throat: Oropharynx is clear and moist.  Eyes: Pupils are equal, round, and reactive to light. Conjunctivae are normal.  Neck: Normal range of  motion. No JVD present. No thyromegaly present.  Cardiovascular: Normal rate, regular rhythm, normal heart sounds and intact distal pulses. Exam reveals no gallop and no friction rub.  No murmur heard. Pulmonary/Chest: Effort normal and breath sounds normal. No respiratory distress. He has no wheezes. He has no rales. He exhibits no tenderness.  Abdominal: Soft. Bowel sounds are normal. He exhibits no distension and no mass. There is no tenderness. There is no rebound and no guarding.   Musculoskeletal: Normal range of motion. He exhibits edema. He exhibits no tenderness.  Lymphadenopathy:    He has no cervical adenopathy.  Neurological: He is alert and oriented to person, place, and time. He has normal reflexes. No cranial nerve deficit. He exhibits normal muscle tone. He displays a negative Romberg sign. Coordination and gait normal.  Skin: Skin is warm and dry. No rash noted.  Psychiatric: He has a normal mood and affect. His behavior is normal. Judgment and thought content normal.  ankles w/1+ edema  R post forearm large V shaped wound 5x2x5 cm  Incision, wound debridement and removal of hematoma  Indication : a localized collection of blood under necrotic skin and debris in the wound   Risks including unsuccessful procedure , possible need for a repeat procedure, scar formation, and others as well as benefits were explained to the patient and her dtr in detail. Oral consent was obtained/signed.    The patient was placed in a sitting position. The area around wound was prepped with povidone-iodine and draped in a sterile fashion. Dead skin was removed. A dry clot was removed.  The wound was dressed with silvadine cream and Telfa pad. Gauze wrap.  Tolerated well.  Complications: None.  Wound instructions provided.  Lab Results  Component Value Date   WBC 7.1 03/21/2018   HGB 11.8 (L) 03/21/2018   HCT 35.0 (L) 03/21/2018   PLT 360.0 03/21/2018   GLUCOSE 95 03/21/2018   CHOL 210 (H) 12/28/2013   TRIG 59.0 12/28/2013   HDL 63.40 12/28/2013   LDLCALC 135 (H) 12/28/2013   ALT 10 02/18/2018   AST 15 02/18/2018   NA 137 03/21/2018   K 4.5 03/21/2018   CL 97 03/21/2018   CREATININE 0.96 03/21/2018   BUN 33 (H) 03/21/2018   CO2 33 (H) 03/21/2018   TSH 1.11 07/30/2017   PSA 0.84 12/28/2013   INR 1.6 (H) 10/17/2016    Dg Chest 2 View  Result Date: 03/25/2018 CLINICAL DATA:  Improving shortness of breath, recent CHF, follow-up EXAM: CHEST - 2 VIEW COMPARISON:   Chest x-ray of 03/10/2017 hand chest x-ray of 12/22/2016 FINDINGS: Chronic changes remain bilaterally particularly in the upper lobes right greater than left consistent with scarring. The lungs remain hyperaerated with increased AP diameter which may indicate emphysema. Mediastinal and hilar contours otherwise are unremarkable with some peribronchial thickening possibly due to chronic bronchitis. The heart is within normal limits in size and permanent pacemaker leads remain. The bones are osteopenic and there is a thoracic kyphosis which is stable. IMPRESSION: 1. No change in hyper aeration which may indicate emphysema with stable chronic change bilaterally. 2. Stable permanent pacemaker.  No change in dorsal kyphosis. Electronically Signed   By: Ivar Drape M.D.   On: 03/25/2018 11:39    Assessment & Plan:   There are no diagnoses linked to this encounter.   No orders of the defined types were placed in this encounter.    Follow-up: No follow-ups on file.  Walker Kehr, MD

## 2018-04-08 NOTE — Assessment & Plan Note (Signed)
Wt gain discussed NAS diet Increase Demadex from 40 mg to 60 mg q am

## 2018-04-10 DIAGNOSIS — J189 Pneumonia, unspecified organism: Secondary | ICD-10-CM | POA: Diagnosis not present

## 2018-04-10 DIAGNOSIS — I482 Chronic atrial fibrillation: Secondary | ICD-10-CM | POA: Diagnosis not present

## 2018-04-10 DIAGNOSIS — E039 Hypothyroidism, unspecified: Secondary | ICD-10-CM | POA: Diagnosis not present

## 2018-04-10 DIAGNOSIS — I5033 Acute on chronic diastolic (congestive) heart failure: Secondary | ICD-10-CM | POA: Diagnosis not present

## 2018-04-11 DIAGNOSIS — J189 Pneumonia, unspecified organism: Secondary | ICD-10-CM | POA: Diagnosis not present

## 2018-04-11 DIAGNOSIS — I482 Chronic atrial fibrillation: Secondary | ICD-10-CM | POA: Diagnosis not present

## 2018-04-11 DIAGNOSIS — E039 Hypothyroidism, unspecified: Secondary | ICD-10-CM | POA: Diagnosis not present

## 2018-04-11 DIAGNOSIS — I5033 Acute on chronic diastolic (congestive) heart failure: Secondary | ICD-10-CM | POA: Diagnosis not present

## 2018-04-15 DIAGNOSIS — J189 Pneumonia, unspecified organism: Secondary | ICD-10-CM | POA: Diagnosis not present

## 2018-04-15 DIAGNOSIS — I482 Chronic atrial fibrillation: Secondary | ICD-10-CM | POA: Diagnosis not present

## 2018-04-15 DIAGNOSIS — I5033 Acute on chronic diastolic (congestive) heart failure: Secondary | ICD-10-CM | POA: Diagnosis not present

## 2018-04-15 DIAGNOSIS — E039 Hypothyroidism, unspecified: Secondary | ICD-10-CM | POA: Diagnosis not present

## 2018-04-17 DIAGNOSIS — I5033 Acute on chronic diastolic (congestive) heart failure: Secondary | ICD-10-CM | POA: Diagnosis not present

## 2018-04-17 DIAGNOSIS — I482 Chronic atrial fibrillation: Secondary | ICD-10-CM | POA: Diagnosis not present

## 2018-04-17 DIAGNOSIS — J189 Pneumonia, unspecified organism: Secondary | ICD-10-CM | POA: Diagnosis not present

## 2018-04-17 DIAGNOSIS — E039 Hypothyroidism, unspecified: Secondary | ICD-10-CM | POA: Diagnosis not present

## 2018-04-19 DIAGNOSIS — I5033 Acute on chronic diastolic (congestive) heart failure: Secondary | ICD-10-CM | POA: Diagnosis not present

## 2018-04-19 DIAGNOSIS — J189 Pneumonia, unspecified organism: Secondary | ICD-10-CM | POA: Diagnosis not present

## 2018-04-19 DIAGNOSIS — I482 Chronic atrial fibrillation: Secondary | ICD-10-CM | POA: Diagnosis not present

## 2018-04-19 DIAGNOSIS — E039 Hypothyroidism, unspecified: Secondary | ICD-10-CM | POA: Diagnosis not present

## 2018-04-21 ENCOUNTER — Telehealth: Payer: Self-pay | Admitting: Internal Medicine

## 2018-04-21 ENCOUNTER — Ambulatory Visit (INDEPENDENT_AMBULATORY_CARE_PROVIDER_SITE_OTHER): Payer: Medicare HMO | Admitting: Internal Medicine

## 2018-04-21 ENCOUNTER — Encounter: Payer: Self-pay | Admitting: Internal Medicine

## 2018-04-21 VITALS — BP 136/78 | HR 95 | Temp 98.2°F | Ht 70.0 in | Wt 154.0 lb

## 2018-04-21 DIAGNOSIS — H6123 Impacted cerumen, bilateral: Secondary | ICD-10-CM

## 2018-04-21 DIAGNOSIS — I482 Chronic atrial fibrillation: Secondary | ICD-10-CM | POA: Diagnosis not present

## 2018-04-21 DIAGNOSIS — E039 Hypothyroidism, unspecified: Secondary | ICD-10-CM | POA: Diagnosis not present

## 2018-04-21 DIAGNOSIS — S41101A Unspecified open wound of right upper arm, initial encounter: Secondary | ICD-10-CM | POA: Diagnosis not present

## 2018-04-21 DIAGNOSIS — R270 Ataxia, unspecified: Secondary | ICD-10-CM | POA: Diagnosis not present

## 2018-04-21 DIAGNOSIS — J189 Pneumonia, unspecified organism: Secondary | ICD-10-CM | POA: Diagnosis not present

## 2018-04-21 DIAGNOSIS — H6120 Impacted cerumen, unspecified ear: Secondary | ICD-10-CM | POA: Insufficient documentation

## 2018-04-21 DIAGNOSIS — I5033 Acute on chronic diastolic (congestive) heart failure: Secondary | ICD-10-CM | POA: Diagnosis not present

## 2018-04-21 DIAGNOSIS — Z7901 Long term (current) use of anticoagulants: Secondary | ICD-10-CM | POA: Diagnosis not present

## 2018-04-21 DIAGNOSIS — I4821 Permanent atrial fibrillation: Secondary | ICD-10-CM

## 2018-04-21 NOTE — Progress Notes (Signed)
Subjective:  Patient ID: Ricky [name], male    DOB: Oct 20, 1922  Age: 84 y.o. MRN: [id number]  CC: No chief complaint on file.   HPI TREMAINE EARWOOD presents for R arm wound, A fb, HTN f/u C/o ear discomfort  Outpatient Medications Prior to Visit  Medication Sig Dispense Refill  . apixaban (ELIQUIS) 5 MG TABS tablet Take 1 tablet (5 mg total) by mouth 2 (two) times daily. 180 tablet 3  . Ascorbic Acid (VITAMIN C PO) Take 1 tablet by mouth daily.    . Carboxymethylcellul-Glycerin (LUBRICATING EYE DROPS OP) Apply 1 drop to eye 5 (five) times daily.    . Cholecalciferol 1000 UNITS tablet Take 1,000 Units by mouth daily. Reported on 12/23/2015    . EPINEPHrine (EPIPEN 2-PAK) 0.3 mg/0.3 mL DEVI Inject 0.3 mg into the muscle daily as needed (allergic reaction). For allergic reaction    . irbesartan (AVAPRO) 300 MG tablet TAKE ONE TABLET BY MOUTH AT BEDTIME 90 tablet 1  . levothyroxine (SYNTHROID, LEVOTHROID) 75 MCG tablet Take 75 mcg by mouth daily.    . potassium chloride (KLOR-CON) 8 MEQ tablet Take 1 tablet (8 mEq total) by mouth 2 (two) times daily. 180 tablet 3  . silver sulfADIAZINE (SILVADENE) 1 % cream Apply 1 application topically daily. 25 g 0  . spironolactone (ALDACTONE) 25 MG tablet Take 0.5 tablets (12.5 mg total) by mouth daily. 45 tablet 3  . torsemide (DEMADEX) 20 MG tablet Take 1 tablet (20 mg total) by mouth 2 (two) times daily. 60 tablet 0  . umeclidinium-vilanterol (ANORO ELLIPTA) 62.5-25 MCG/INH AEPB Inhale 1 puff into the lungs daily. 1 each 5  . carvedilol (COREG) 12.5 MG tablet Take 1 tablet (12.5 mg total) by mouth 2 (two) times daily. 60 tablet 3  . diphenhydrAMINE (BENADRYL) 25 mg capsule Take 1-2 capsules (25-50 mg total) by mouth every 4 (four) hours as needed for itching or allergies (1-2 for bee stings). 60 capsule 2   No facility-administered medications prior to visit.     ROS: Review of Systems  Constitutional: Negative for appetite change, fatigue and  unexpected weight change.  HENT: Negative for congestion, nosebleeds, sneezing, sore throat and trouble swallowing.   Eyes: Negative for itching and visual disturbance.  Respiratory: Negative for cough.   Cardiovascular: Negative for chest pain, palpitations and leg swelling.  Gastrointestinal: Negative for abdominal distention, blood in stool, diarrhea and nausea.  Genitourinary: Negative for frequency and hematuria.  Musculoskeletal: Positive for gait problem. Negative for back pain, joint swelling and neck pain.  Skin: Positive for wound. Negative for rash.  Neurological: Negative for dizziness, tremors, speech difficulty and weakness.  Hematological: Bruises/bleeds easily.  Psychiatric/Behavioral: Negative for agitation, dysphoric mood, sleep disturbance and suicidal ideas. The patient is not nervous/anxious.     Objective:  BP 136/78 (BP Location: Left Arm, Patient Position: Sitting, Cuff Size: Normal)   Pulse 95   Temp 98.2 F (36.8 C) (Oral)   Ht 5\' 10"  (1.778 m)   Wt 154 lb (69.9 kg)   SpO2 97%   BMI 22.10 kg/m   BP Readings from Last 3 Encounters:  04/21/18 136/78  04/08/18 140/82  03/21/18 (!) 154/80    Wt Readings from Last 3 Encounters:  04/21/18 154 lb (69.9 kg)  03/21/18 145 lb (65.8 kg)  02/24/18 143 lb (64.9 kg)    Physical Exam  Constitutional: He is oriented to person, place, and time. He appears well-developed. No distress.  NAD  HENT:  Mouth/Throat: Oropharynx is clear and moist.  Eyes: Pupils are equal, round, and reactive to light. Conjunctivae are normal.  Neck: Normal range of motion. No JVD present. No thyromegaly present.  Cardiovascular: Normal rate, regular rhythm, normal heart sounds and intact distal pulses. Exam reveals no gallop and no friction rub.  No murmur heard. Pulmonary/Chest: Effort normal and breath sounds normal. No respiratory distress. He has no wheezes. He has no rales. He exhibits no tenderness.  Abdominal: Soft. Bowel  sounds are normal. He exhibits no distension and no mass. There is no tenderness. There is no rebound and no guarding.  Musculoskeletal: Normal range of motion. He exhibits tenderness. He exhibits no edema.  Lymphadenopathy:    He has no cervical adenopathy.  Neurological: He is alert and oriented to person, place, and time. He has normal reflexes. No cranial nerve deficit. He exhibits normal muscle tone. He displays a negative Romberg sign. Coordination abnormal. Gait normal.  Skin: Skin is warm and dry. No rash noted.  Psychiatric: He has a normal mood and affect. His behavior is normal. Judgment and thought content normal.  R arm wound healed Walker Wax B   Procedure Note :     Procedure :  Ear irrigation   Indication:  Cerumen impaction   Risks, including pain, dizziness, eardrum perforation, bleeding, infection and others as well as benefits were explained to the patient in detail. Verbal consent was obtained and the patient agreed to proceed.    We used "The Elephant Ear Irrigation Device" filled with lukewarm water for irrigation. A large amount wax was recovered. Procedure has also required manual wax removal with an ear wax curette and ear forceps.   Tolerated well. Complications: None.   Postprocedure instructions :  Call if problems.    Lab Results  Component Value Date   WBC 7.1 03/21/2018   HGB 11.8 (L) 03/21/2018   HCT 35.0 (L) 03/21/2018   PLT 360.0 03/21/2018   GLUCOSE 95 03/21/2018   CHOL 210 (H) 12/28/2013   TRIG 59.0 12/28/2013   HDL 63.40 12/28/2013   LDLCALC 135 (H) 12/28/2013   ALT 10 02/18/2018   AST 15 02/18/2018   NA 137 03/21/2018   K 4.5 03/21/2018   CL 97 03/21/2018   CREATININE 0.96 03/21/2018   BUN 33 (H) 03/21/2018   CO2 33 (H) 03/21/2018   TSH 1.11 07/30/2017   PSA 0.84 12/28/2013   INR 1.6 (H) 10/17/2016    Dg Chest 2 View  Result Date: 03/25/2018 CLINICAL DATA:  Improving shortness of breath, recent CHF, follow-up EXAM: CHEST - 2  VIEW COMPARISON:  Chest x-ray of 03/10/2017 hand chest x-ray of 12/22/2016 FINDINGS: Chronic changes remain bilaterally particularly in the upper lobes right greater than left consistent with scarring. The lungs remain hyperaerated with increased AP diameter which may indicate emphysema. Mediastinal and hilar contours otherwise are unremarkable with some peribronchial thickening possibly due to chronic bronchitis. The heart is within normal limits in size and permanent pacemaker leads remain. The bones are osteopenic and there is a thoracic kyphosis which is stable. IMPRESSION: 1. No change in hyper aeration which may indicate emphysema with stable chronic change bilaterally. 2. Stable permanent pacemaker.  No change in dorsal kyphosis. Electronically Signed   By: Ivar Drape M.D.   On: 03/25/2018 11:39    Assessment & Plan:   There are no diagnoses linked to this encounter.   No orders of the defined types were placed in this encounter.  Follow-up: No follow-ups on file.  Walker Kehr, MD

## 2018-04-21 NOTE — Assessment & Plan Note (Signed)
May need to switch to 2.5 mg bid in the future. Monitoring creatinine

## 2018-04-21 NOTE — Assessment & Plan Note (Signed)
Healing well.

## 2018-04-21 NOTE — Telephone Encounter (Signed)
Pt was questioning if he should take 2.5mg  Eliquis bid vs the 5mg . I told pt I did not think he fit the criteria to drop to 2.5mg , but I will forward this message to Dr [name] to double check. Pt verbalized understanding and had no additional questions.

## 2018-04-21 NOTE — Assessment & Plan Note (Signed)
Will irrigate 

## 2018-04-21 NOTE — Telephone Encounter (Signed)
LVM for return call. 

## 2018-04-21 NOTE — Assessment & Plan Note (Signed)
Eliquis 

## 2018-04-21 NOTE — Assessment & Plan Note (Signed)
walker

## 2018-04-21 NOTE — Telephone Encounter (Signed)
New Message:       Pt c/o medication issue:  1. Name of Medication: apixaban (ELIQUIS) 5 MG TABS tablet  2. How are you currently taking this medication (dosage and times per day)?Take 1 tablet (5 mg total) by mouth 2 (two) times daily.   3. Are you having a reaction (difficulty breathing--STAT)? No  4. What is your medication issue? Pt states his PCP Dr. Alain Marion feels as if the pt should be taking only 2.5 mg of this medication. Pt states to either get back with him or reach out to Dr. Alain Marion

## 2018-04-22 ENCOUNTER — Ambulatory Visit: Payer: Medicare HMO | Admitting: Internal Medicine

## 2018-04-22 DIAGNOSIS — J189 Pneumonia, unspecified organism: Secondary | ICD-10-CM | POA: Diagnosis not present

## 2018-04-22 DIAGNOSIS — E039 Hypothyroidism, unspecified: Secondary | ICD-10-CM | POA: Diagnosis not present

## 2018-04-22 DIAGNOSIS — I482 Chronic atrial fibrillation: Secondary | ICD-10-CM | POA: Diagnosis not present

## 2018-04-22 DIAGNOSIS — I5033 Acute on chronic diastolic (congestive) heart failure: Secondary | ICD-10-CM | POA: Diagnosis not present

## 2018-04-23 DIAGNOSIS — I482 Chronic atrial fibrillation: Secondary | ICD-10-CM | POA: Diagnosis not present

## 2018-04-23 DIAGNOSIS — J189 Pneumonia, unspecified organism: Secondary | ICD-10-CM | POA: Diagnosis not present

## 2018-04-23 DIAGNOSIS — I5033 Acute on chronic diastolic (congestive) heart failure: Secondary | ICD-10-CM | POA: Diagnosis not present

## 2018-04-23 DIAGNOSIS — E039 Hypothyroidism, unspecified: Secondary | ICD-10-CM | POA: Diagnosis not present

## 2018-04-24 DIAGNOSIS — I5033 Acute on chronic diastolic (congestive) heart failure: Secondary | ICD-10-CM | POA: Diagnosis not present

## 2018-04-24 DIAGNOSIS — J189 Pneumonia, unspecified organism: Secondary | ICD-10-CM | POA: Diagnosis not present

## 2018-04-24 DIAGNOSIS — E039 Hypothyroidism, unspecified: Secondary | ICD-10-CM | POA: Diagnosis not present

## 2018-04-24 DIAGNOSIS — I482 Chronic atrial fibrillation: Secondary | ICD-10-CM | POA: Diagnosis not present

## 2018-04-27 NOTE — Telephone Encounter (Signed)
Agree L not by criteria

## 2018-04-28 ENCOUNTER — Telehealth: Payer: Self-pay

## 2018-04-28 NOTE — Telephone Encounter (Signed)
   [facility] Pre-operative Risk Assessment    Request for surgical clearance:  1. What type of surgery is being performed? Surgical Extraction of 2 teeth   2. When is this surgery scheduled? TBD   3. What type of clearance is required (medical clearance vs. Pharmacy clearance to hold med vs. Both) Both  4. Are there any medications that need to be held prior to surgery and how long? Eliquis   5. Practice name and name of physician performing surgery? [facility], Dr. Iven Finn   6. What is your office phone number [phone]    7.   What is your office fax number [phone]  8.   Anesthesia type (None, local, MAC, general) ? Local   Mendel Ryder 04/28/2018, 3:20 PM  _________________________________________________________________   (provider comments below)

## 2018-04-28 NOTE — Telephone Encounter (Signed)
We recommend against holding anticoagulation for extraction of 2 teeth.

## 2018-04-28 NOTE — Telephone Encounter (Signed)
Routing to pharmacy.  Pruitt Taboada C. Keyonta Barradas, RN, ANP-C [facility] [address] [place], [place]  [address] [phone]  

## 2018-04-29 ENCOUNTER — Ambulatory Visit: Payer: Medicare HMO | Admitting: Physician Assistant

## 2018-04-29 NOTE — Telephone Encounter (Signed)
Holding anticoagulation for dental extraction is not recommended.  Kerin Ransom PA-C 04/29/2018 3:48 PM

## 2018-04-29 NOTE — Telephone Encounter (Signed)
Pt understands he is to stay on 5mg  Eliquis bid. Pt had questions regarding new plans for tooth extractions. I advised pt per pharmacy's note, stopping anticoagulation is not recommended for teeth extraction. Pt understands our pre op dept will be in contact with his periodontist regarding their recommendations.

## 2018-05-06 DIAGNOSIS — R69 Illness, unspecified: Secondary | ICD-10-CM | POA: Diagnosis not present

## 2018-05-13 ENCOUNTER — Institutional Professional Consult (permissible substitution): Payer: Medicare HMO | Admitting: Emergency Medicine

## 2018-05-15 DIAGNOSIS — R69 Illness, unspecified: Secondary | ICD-10-CM | POA: Diagnosis not present

## 2018-05-16 ENCOUNTER — Other Ambulatory Visit: Payer: Self-pay | Admitting: Internal Medicine

## 2018-05-22 NOTE — Telephone Encounter (Signed)
Pt is calling to let office know that the [facility] on [address] did not get the first e-prescribe that was sent ofirbesartan and he would like to have it resent

## 2018-05-30 ENCOUNTER — Ambulatory Visit (INDEPENDENT_AMBULATORY_CARE_PROVIDER_SITE_OTHER): Payer: Medicare HMO | Admitting: *Deleted

## 2018-05-30 ENCOUNTER — Ambulatory Visit: Payer: Medicare HMO | Admitting: Physician Assistant

## 2018-05-30 ENCOUNTER — Encounter: Payer: Self-pay | Admitting: Physician Assistant

## 2018-05-30 VITALS — BP 120/80 | HR 65 | Ht 70.0 in | Wt 154.1 lb

## 2018-05-30 DIAGNOSIS — I4821 Permanent atrial fibrillation: Secondary | ICD-10-CM

## 2018-05-30 DIAGNOSIS — I10 Essential (primary) hypertension: Secondary | ICD-10-CM

## 2018-05-30 DIAGNOSIS — I442 Atrioventricular block, complete: Secondary | ICD-10-CM

## 2018-05-30 DIAGNOSIS — I482 Chronic atrial fibrillation, unspecified: Secondary | ICD-10-CM

## 2018-05-30 DIAGNOSIS — I5032 Chronic diastolic (congestive) heart failure: Secondary | ICD-10-CM | POA: Diagnosis not present

## 2018-05-30 NOTE — Progress Notes (Signed)
Remote pacemaker transmission.   

## 2018-05-30 NOTE — Patient Instructions (Addendum)
Medication Instructions: Your physician has recommended you make the following change in your medication:   Take a extra torsemide (20 mg) today and Sunday  Take a extra potassium (8 meq)  today and Sunday    Labwork: None  Procedures/Testing: None  Follow-Up: Your physician recommends that you schedule a follow-up appointment in: 3 months with Dr. [name]   Any Additional Special Instructions Will Be Listed Below (If Applicable).  Call our office is your shortness of breath persist or worsens [phone]   If you need a refill on your cardiac medications before your next appointment, please call your pharmacy.

## 2018-05-30 NOTE — Progress Notes (Signed)
Cardiology Office Note:    Date:  05/30/2018   ID:  Ricky [name], DOB [birth year], MRN [id number]  PCP:  Cassandria Anger, MD  Cardiologist/Electrophysiologist:  Virl Axe, MD   Referring MD: Cassandria Anger, MD   Chief Complaint  Patient presents with  . Follow-up    CHF    History of Present Illness:    Ricky [name] is a 84 y.o. male [employer] veteran [employer]) with permanent atrial fibrillation, atrial flutter, s/p AVN ablation s/p pacemaker (after 2 failed PVI ablation procedures), hypertension, hypothyroidism, diastolic heart failure.  Cardiac Catheterization in 1997 reportedly demonstrated no obstructive disease.  He was admitted in May 2019 with decompensated congestive heart failure.  He was last seen in June 2019.  Spironolactone was reduced due to low blood pressure.  A follow up chest X-ray did show pleural effusions and his diuretics were adjusted with noted improvement on follow up chest X-ray.    Ricky [name] returns for follow-up on congestive heart failure.  He is here alone.  Since last seen, he has noted a few episodes of shortness of breath at night.  This typically occurs after getting up to go the bathroom.  Otherwise, he has not had orthopnea, paroxysmal nocturnal dyspnea.  He has chronic lower extremity swelling without significant change.  He has lost a considerable amount of weight and has been adjusting his diet with weight gain.  He denies any sudden weight gain.  He has not had chest discomfort, syncope.  He has not had significant cough.  He has not had any bleeding issues.  Prior CV studies:   The following studies were reviewed today:  Echo 02/12/2018 Mild LVH, EF 60-65, normal wall motion, aortic sclerosis, mild MR, moderate RAE, mild TR, PASP 33  Echo 11/03/2012 EF 55-60, normal wall motion, mild to moderate LAE, mild RAE, PASP 40  Past Medical History:  Diagnosis Date  . ANXIETY 04/26/2008  . Atrial fibrillation ([place]) 10/31/2010   Coumadin therapy;  Echo 6/12: EF 50-55%, moderate MR, moderate TR, PASP 52, mild LAE  . BPH (benign prostatic hypertrophy)   . CAP (community acquired pneumonia) 12/2017  . CHF (congestive heart failure) ([facility])   . Essential hypertension, benign 10/03/2007  . HTN (hypertension) 06/07/2013  . HYPOTHYROIDISM 09/04/2007  . LYMPHADENOPATHY 04/06/2009  . Nocturia   . Pacemaker    Status post AV nodal ablation  . TOBACCO USE, QUIT 07/14/2009   Surgical Hx: The patient  has a past surgical history that includes Pacemaker insertion (10/16/05); Colonoscopy; Eye surgery (2000); Eye surgery; Inguinal hernia repair (Right, 05/06/2013); Hernia repair; and PPM GENERATOR CHANGEOUT (N/A, 10/31/2016).   Current Medications: Current Meds  Medication Sig  . apixaban (ELIQUIS) 5 MG TABS tablet Take 1 tablet (5 mg total) by mouth 2 (two) times daily.  . Ascorbic Acid (VITAMIN C PO) Take 1 tablet by mouth daily.  . Carboxymethylcellul-Glycerin (LUBRICATING EYE DROPS OP) Apply 1 drop to eye 5 (five) times daily.  . Cholecalciferol 1000 UNITS tablet Take 1,000 Units by mouth daily. Reported on 12/23/2015  . EPINEPHrine (EPIPEN 2-PAK) 0.3 mg/0.3 mL DEVI Inject 0.3 mg into the muscle daily as needed (allergic reaction). For allergic reaction  . irbesartan (AVAPRO) 300 MG tablet TAKE 1 TABLET BY MOUTH AT BEDTIME  . levothyroxine (SYNTHROID, LEVOTHROID) 75 MCG tablet Take 75 mcg by mouth daily.  . potassium chloride (KLOR-CON) 8 MEQ tablet Take 1 tablet (8 mEq total) by mouth 2 (two) times daily.  Marland Kitchen  silver sulfADIAZINE (SILVADENE) 1 % cream Apply 1 application topically daily.  Marland Kitchen torsemide (DEMADEX) 20 MG tablet Take 1 tablet (20 mg total) by mouth 2 (two) times daily.  Marland Kitchen umeclidinium-vilanterol (ANORO ELLIPTA) 62.5-25 MCG/INH AEPB Inhale 1 puff into the lungs daily.     Allergies:   Bee venom; Amiodarone hcl; Atorvastatin; Benazepril hcl; Clindamycin/lincomycin; Clonidine hydrochloride; Colesevelam; Diltiazem hcl;  Ezetimibe; Levaquin [levofloxacin in d5w]; Verapamil; and Amoxicillin   Social History   Tobacco Use  . Smoking status: Former Smoker    Packs/day: 1.00    Years: 30.00    Pack years: 30.00    Types: Cigarettes    Last attempt to quit: 05/01/1979    Years since quitting: 39.1  . Smokeless tobacco: Never Used  Substance Use Topics  . Alcohol use: No    Alcohol/week: 0.0 standard drinks  . Drug use: No     Family Hx: The patient's family history includes Cancer in his mother; Heart disease in his father; Hypertension in his mother and other.  ROS:   Please see the history of present illness.    Review of Systems  Respiratory: Positive for shortness of breath.    All other systems reviewed and are negative.   EKGs/Labs/Other Test Reviewed:    EKG:  EKG is not ordered today.    Recent Labs: 07/30/2017: TSH 1.11 12/27/2017: Magnesium 1.9 01/30/2018: Pro B Natriuretic peptide (BNP) 501.0 02/09/2018: B Natriuretic Peptide 436.9 02/18/2018: ALT 10 03/21/2018: BUN 33; Creatinine, Ser 0.96; Hemoglobin 11.8; Platelets 360.0; Potassium 4.5; Sodium 137   Recent Lipid Panel Lab Results  Component Value Date/Time   CHOL 210 (H) 12/28/2013 08:45 AM   TRIG 59.0 12/28/2013 08:45 AM   HDL 63.40 12/28/2013 08:45 AM   CHOLHDL 3 12/28/2013 08:45 AM   LDLCALC 135 (H) 12/28/2013 08:45 AM    Physical Exam:    VS:  BP 120/80   Pulse 65   Ht 5\' 10"  (1.778 m)   Wt 154 lb 1.9 oz (69.9 kg)   SpO2 97%   BMI 22.11 kg/m     Wt Readings from Last 3 Encounters:  05/30/18 154 lb 1.9 oz (69.9 kg)  04/21/18 154 lb (69.9 kg)  03/21/18 145 lb (65.8 kg)     Physical Exam  Constitutional: He is oriented to person, place, and time. He appears well-developed and well-nourished. No distress.  HENT:  Head: Normocephalic and atraumatic.  Eyes: No scleral icterus.  Neck: Neck supple. No thyromegaly present.  Cardiovascular: Normal rate and regular rhythm.  Murmur heard.  Mid to late systolic  murmur is present with a grade of 2/6 at the lower left sternal border. Pulmonary/Chest: Effort normal. He has no wheezes. He has no rales.  Abdominal: Soft. He exhibits no distension.  Musculoskeletal: He exhibits edema (1+ bilat ankle edema).  Lymphadenopathy:    He has no cervical adenopathy.  Neurological: He is alert and oriented to person, place, and time.  Skin: Skin is warm and dry.  Psychiatric: He has a normal mood and affect.    ASSESSMENT & PLAN:    Chronic diastolic CHF (congestive heart failure) (HCC) EF 60-65 by echocardiogram 5/19.  Overall, his volume appears fairly stable.  He has noted some increased shortness of breath at night.  I have asked him to increase his torsemide to 60 mg today and take 60 mg on Sunday.  Otherwise he will continue on 40 potassium 8 mEq on Saturday and Sunday.  He knows to call if  his nocturnal symptoms continue or worsen.  Otherwise, follow-up with Dr. [name] in 3 months.  Permanent atrial fibrillation (HCC) He is tolerating anticoagulation.  Continue apixaban at current dose.  Essential hypertension The patient's blood pressure is controlled on his current regimen.  Continue current therapy.     Dispo:  Return in about 3 months (around 08/29/2018) for Routine Follow Up w/ Dr. [name].   Medication Adjustments/Labs and Tests Ordered: Current medicines are reviewed at length with the patient today.  Concerns regarding medicines are outlined above.  Tests Ordered: No orders of the defined types were placed in this encounter.  Medication Changes: No orders of the defined types were placed in this encounter.   Signed, [name], PA-C  05/30/2018 11:27 AM    [facility] [address], [place], [place]  [address] Phone: [phone]; Fax: [phone]

## 2018-05-31 LAB — CUP PACEART REMOTE DEVICE CHECK
Battery Voltage: 2.96 V
Brady Statistic AP VP Percent: 89 %
Brady Statistic AS VS Percent: 1 %
Brady Statistic RA Percent Paced: 89 %
Brady Statistic RV Percent Paced: 99 %
Date Time Interrogation Session: 20190913124137
Implantable Lead Implant Date: 19980824
Implantable Pulse Generator Implant Date: 20180214
Lead Channel Impedance Value: 350 Ohm
Lead Channel Pacing Threshold Pulse Width: 0.4 ms
Lead Channel Pacing Threshold Pulse Width: 0.8 ms
Lead Channel Setting Pacing Amplitude: 2.5 V
Lead Channel Setting Sensing Sensitivity: 6 mV
MDC IDC LEAD IMPLANT DT: 19980824
MDC IDC LEAD LOCATION: 753859
MDC IDC LEAD LOCATION: 753860
MDC IDC MSMT BATTERY REMAINING LONGEVITY: 79 mo
MDC IDC MSMT BATTERY REMAINING PERCENTAGE: 95.5 %
MDC IDC MSMT LEADCHNL RA IMPEDANCE VALUE: 340 Ohm
MDC IDC MSMT LEADCHNL RA PACING THRESHOLD AMPLITUDE: 0.75 V
MDC IDC MSMT LEADCHNL RA SENSING INTR AMPL: 4.9 mV
MDC IDC MSMT LEADCHNL RV PACING THRESHOLD AMPLITUDE: 0.5 V
MDC IDC MSMT LEADCHNL RV SENSING INTR AMPL: 12 mV
MDC IDC SET LEADCHNL RA PACING AMPLITUDE: 2 V
MDC IDC SET LEADCHNL RV PACING PULSEWIDTH: 0.8 ms
MDC IDC STAT BRADY AP VS PERCENT: 1 %
MDC IDC STAT BRADY AS VP PERCENT: 11 %
Pulse Gen Model: 2272
Pulse Gen Serial Number: 7998385

## 2018-06-02 ENCOUNTER — Encounter: Payer: Self-pay | Admitting: Cardiology

## 2018-06-02 ENCOUNTER — Institutional Professional Consult (permissible substitution): Payer: Medicare HMO | Admitting: Internal Medicine

## 2018-06-05 ENCOUNTER — Telehealth: Payer: Self-pay | Admitting: Internal Medicine

## 2018-06-05 NOTE — Telephone Encounter (Signed)
Copied from [place] [id number]. Topic: Quick Communication - Rx Refill/Question >> Jun 05, 2018  1:20 PM Sheran Luz wrote: Pt called stating that he has been contacted by [facility] questioning him about this medication (dosage/how often he takes it) because his refill requests do not match the information on file. Pt has been taking 150 mg (cutting tablet in half) a night stating that he was told to do so by a provider (unsure who and when). Pt would like to know if he should continue to take half tablet or start taking the full tablet again. Please advise.

## 2018-06-05 NOTE — Telephone Encounter (Signed)
Pt is talking about his Irbesartan RX.   I cannot find anything that says to cut tablet in half. Please advise with your recommendation

## 2018-06-07 ENCOUNTER — Other Ambulatory Visit: Payer: Self-pay | Admitting: Internal Medicine

## 2018-06-11 MED ORDER — IRBESARTAN 300 MG PO TABS: 150.0000 mg | ORAL_TABLET | Freq: Every day | ORAL | 3 refills | Status: DC

## 2018-06-11 NOTE — Telephone Encounter (Signed)
Pt states he is doing well on a 1/2 tablet, RX sent to [facility]

## 2018-06-11 NOTE — Telephone Encounter (Signed)
Is he doing well on 1/2 tab We can change to a lower dose if needed Thx

## 2018-06-13 ENCOUNTER — Encounter: Payer: Self-pay | Admitting: Internal Medicine

## 2018-06-13 ENCOUNTER — Ambulatory Visit: Payer: Medicare HMO | Admitting: Internal Medicine

## 2018-06-13 ENCOUNTER — Ambulatory Visit (INDEPENDENT_AMBULATORY_CARE_PROVIDER_SITE_OTHER)
Admission: RE | Admit: 2018-06-13 | Discharge: 2018-06-13 | Disposition: A | Discharge status: Home / Self Care | Payer: Medicare HMO | Source: Ambulatory Visit | Attending: Internal Medicine | Admitting: Internal Medicine

## 2018-06-13 VITALS — BP 122/60 | HR 81 | Ht 66.75 in | Wt 150.0 lb

## 2018-06-13 DIAGNOSIS — Z23 Encounter for immunization: Secondary | ICD-10-CM | POA: Diagnosis not present

## 2018-06-13 DIAGNOSIS — R9389 Abnormal findings on diagnostic imaging of other specified body structures: Secondary | ICD-10-CM

## 2018-06-13 DIAGNOSIS — I10 Essential (primary) hypertension: Secondary | ICD-10-CM | POA: Diagnosis not present

## 2018-06-13 NOTE — Progress Notes (Addendum)
Ricky [name], male    DOB: December 18, 1922, 84 y.o.   MRN: [id number]    Brief patient profile:  89 yowm quit smoking 1980 with hx of  "bad cough with colds but only happens  few years" but typically able to get back to baseline s intervention w/in a few weeks then admitted with apparent RUL pna:   Admit date: 12/22/2017 Discharge date: 12/27/2017  Admitted From: Home Disposition:  Home   Brief/Interim Summary: 84 year old male who presented with coughing. Patient does have the significant past medical history for anxiety, atrial fibrillation, hypothyroidism and history of tobacco abuse. Patient complained of 3-4-day history of worsening generalized weakness, associated with cough and dyspnea. No fevers or chills. Positive significant decreased p.o. intake. On the initial physical examination blood pressure 148/77, heart rate 65, respiratory rate 21, oxygen saturation 96%.Moist mucous membranes, lungs were showing rhonchi bilateral with increased work of breathing, heart S1-S2 present and rhythmic, no gallops, rubs or murmurs, the abdomen was soft nontender, no lower extremity edema. Sodium 140, potassium 4.0, chloride 101, bicarb 23, glucose 154, BUN 31, creatinine 1.07, white count 7.5, hemoglobin 13.4, hematocrit 41.5, platelets 189,urine analysis with 30 protein, specific gravity 1.020, 0-5 white cells, 0-5 RBCs.Chest x-ray, left rotation, right upper lobe infiltrate.EKG a sensed, V paced.    1.  Right upper lobe community-acquired pneumonia.  Patient was admitted to the medical ward, he was placed on a remote telemetry monitor, IV antibiotic therapy with levofloxacin (allergic to penicillin), oximetry monitoring submental oxygen per nasal cannula, and bronchodilator therapy.  He responded well to medical therapy, his antibiotics were changed to doxycyclin, confusion presumed to be CNS related side effects from levofloxacin.  Patient will receive 3 more days of doxycycline.  2.   Pulmonary edema due to volume overload, not present on admission.  Patient received IV fluids, developed volume overload, follow-up chest x-ray with pulmonary edema.  Patient received furosemide intravenously with improvement of his volume status.  IV fluids were discontinued.  Echocardiogram showed ejection fraction 55-60% on the left ventricle, no wall motion abnormality.   3.  Metabolic encephalopathy with features of delirium.  Patient developed confusion during his hospital stay, with features of delirium. Patient received supportive medical therapy including diuretics for hypervolemia, discontinue fluoroquinolones, received physical therapy evaluation.  4.  Chronic atrial fibrillation.  Patient remain on V paced rhythm, remain rate controlled, continue apixaban for anticoagulation.  5.  Hypothyroidism.  Levothyroxine 75 mcg daily.  6.  Hypertension.  Patient will continue irbesartan and carvedilol.  Has been on torsemide for a prolonged period of time, this will be continued, old records personally reviewed and patient tends to have elevated potassium in the high 4 range.  His discharge creatinine 0.90, serum bicarbonate 32, potassium 3.7, sodium 143.  To consider stopping diuretics as an outpatient.  For now hold on potassium supplements, will need close follow-up kidney function and electrolytes as an outpatient.    Still felt weakness generalized p d/c but no cough/ no fever  Then worse sob/ leg swelling and re-admit"  Admit date: 02/09/2018 Discharge date: 02/13/2018  Home Health: Resume PT, OT, RN, aide Equipment/Devices: Has rolling walker Discharge Condition: Stable CODE STATUS: DNR Diet recommendation: Heart healthy  Brief/Interim Summary: Ricky [name] D34 y.o.malewith a history of AFib s/p AV nodal ablation, PPM, remote tobacco use, HTN, chronic diastolic CHF who presented with dyspnea. With suspicion of CHF, cardiology increased diuretics as outpatient. He has  been treated for pneumonia recently as well.  He was hypoxic on admission, and admitted for acute on chronic diastolic CHF, started on IV lasix. Cardiology consulted. He has developed a leukocytosis and while RUL opacities from recent PNA have improved, LUL opacities are concerning for pneumonia.Doxycycline was started and leukocytosis has resolved. Diuretics were transitioned to PO torsemide and spironolactone with continued diuresis and resolution of dyspnea.  Discharge Diagnoses:  Principal Problem:   Acute CHF (congestive heart failure) (HCC) Active Problems:   Hypothyroidism (acquired)   Atrial fibrillation with RVR (HCC)   Pacemaker-[employer]   Essential hypertension   Respiratory distress, acute   DNR (do not resuscitate)   Acute respiratory failure ([facility])  Acute hypoxic respiratory failure: Resolved  Acute on chronic diastolic CHF: EF preserved on echo, no regional WMAs.  - Converted IV lasix to po torsemide, spiro which will continue per cardiology at discharge: torsemide 20 mg PO BID and continue spironolactone 25 mg po daily - Recheck BMP at follow up - Daily weight monitoring at home.  LUL CAP: Developed leukocytosis and has a cough (albeit very mild). Monocytic predominance noted.  - Started doxycycline x5 days. Discussed at length with pt and daughter the different antibiotics that are more appropriate though he has multiple allergies significantly restricting choices.   Note pt on anoro ellipta PTA though denies history of COPD:  - Consider PFTs.  Chronic atrial fibrillation: Rate controlled, paced.  - Continue coreg, eliquis  HTN:  - Continue ARB, coreg  Hypothyroidism: Last TSH 1.11 - Continue synthroid  Macrocytic anemia:  - Monitor. Anemia panel showed adequate B12, folate, iron.     After d/c poor  balance/ weak, needed rollator - leg swelling improved but overall no better so referred to pulmonary clinic 06/13/2018 by Dr   [name].   06/13/2018   1st pulmonary eval / Ricky [name]  Re abn cxr/ pt aggravated re choice of abx initially for CAP but note long list of allergies limited choices  Chief Complaint  Patient presents with  . Pulmonary Consult    Referred by Dr. Virl Axe for abnormal cxr. He states he has occ SOB at night when he lies down.   Dyspnea:  Not limited by breathing from desired activities  As by balance / strength  > sats fine walking  Cough: none at all including on wakening in am  Sleep: 4in blocks hob/ one pillow still occ at hs but not typically waking him SABA use: none    No obvious day to day or daytime variability or assoc excess/ purulent sputum or mucus plugs or hemoptysis or cp or chest tightness, subjective wheeze or overt sinus or hb symptoms.     Also denies any obvious fluctuation of symptoms with weather or environmental changes or other aggravating or alleviating factors except as outlined above   No unusual exposure hx or h/o childhood pna/ asthma or knowledge of premature birth.  Current Allergies, Complete Past Medical History, Past Surgical History, Family History, and Social History were reviewed in [facility] record.  ROS  The following are not active complaints unless bolded Hoarseness, sore throat, dysphagia, dental problems, itching, sneezing,  nasal congestion or discharge of excess mucus or purulent secretions, ear ache,   fever, chills, sweats, unintended wt loss or wt gain, classically pleuritic or exertional cp,  orthopnea pnd or arm/hand swelling  or leg swelling, presyncope, palpitations, abdominal pain, anorexia, nausea, vomiting, diarrhea  or change in bowel habits or change in bladder habits, change in stools or change in  urine, dysuria, hematuria,  rash, arthralgias, visual complaints, headache, numbness, weakness or ataxia or problems with walking or coordination,  change in mood or  memory.            Past Medical History:  Diagnosis Date  . ANXIETY  04/26/2008  . Atrial fibrillation ([place]) 10/31/2010   Coumadin therapy;  Echo 6/12: EF 50-55%, moderate MR, moderate TR, PASP 52, mild LAE  . BPH (benign prostatic hypertrophy)   . CAP (community acquired pneumonia) 12/2017  . CHF (congestive heart failure) ([facility])   . Essential hypertension, benign 10/03/2007  . HTN (hypertension) 06/07/2013  . HYPOTHYROIDISM 09/04/2007  . LYMPHADENOPATHY 04/06/2009  . Nocturia   . Pacemaker    Status post AV nodal ablation  . TOBACCO USE, QUIT 07/14/2009    Outpatient Medications Prior to Visit  Medication Sig Dispense Refill  . apixaban (ELIQUIS) 5 MG TABS tablet Take 1 tablet (5 mg total) by mouth 2 (two) times daily. 180 tablet 3  . Ascorbic Acid (VITAMIN C PO) Take 1 tablet by mouth daily.    . Carboxymethylcellul-Glycerin (LUBRICATING EYE DROPS OP) Apply 1 drop to eye 5 (five) times daily.    . carvedilol (COREG) 12.5 MG tablet TAKE 1 TABLET BY MOUTH TWICE DAILY 180 tablet 1  . Cholecalciferol 1000 UNITS tablet Take 1,000 Units by mouth daily. Reported on 12/23/2015    . diphenhydrAMINE (BENADRYL) 25 mg capsule Take 1-2 capsules (25-50 mg total) by mouth every 4 (four) hours as needed for itching or allergies (1-2 for bee stings). 60 capsule 2  . EPINEPHrine (EPIPEN 2-PAK) 0.3 mg/0.3 mL DEVI Inject 0.3 mg into the muscle daily as needed (allergic reaction). For allergic reaction    . irbesartan (AVAPRO) 300 MG tablet Take 0.5 tablets (150 mg total) by mouth at bedtime. 45 tablet 3  . levothyroxine (SYNTHROID, LEVOTHROID) 75 MCG tablet Take 75 mcg by mouth daily.    . potassium chloride (KLOR-CON) 8 MEQ tablet Take 1 tablet (8 mEq total) by mouth 2 (two) times daily. 180 tablet 3  . silver sulfADIAZINE (SILVADENE) 1 % cream Apply 1 application topically daily. 25 g 0  . torsemide (DEMADEX) 20 MG tablet Take 1 tablet (20 mg total) by mouth 2 (two) times daily. 60 tablet 0  . spironolactone (ALDACTONE) 25 MG tablet Take 0.5 tablets (12.5 mg total) by mouth  daily. 45 tablet 3  . umeclidinium-vilanterol (ANORO ELLIPTA) 62.5-25 MCG/INH AEPB Inhale 1 puff into the lungs daily. 1 each 5                  Objective:     BP 122/60 (BP Location: Left Arm, Cuff Size: Normal)   Pulse 81   Ht 5' 6.75" (1.695 m)   Wt 150 lb (68 kg)   SpO2 100%   BMI 23.67 kg/m   SpO2: 100 %  RA   Chronically ill w/c bound wm nad  HEENT: nl dentition, turbinates bilaterally, and oropharynx. Nl external ear canals without cough reflex   NECK :  without JVD/Nodes/TM/ nl carotid upstrokes bilaterally   LUNGS: no acc muscle use,  Nl contour chest which is clear to A and P bilaterally without cough on insp or exp maneuvers   CV:  RRR  no s3 or murmur or increase in P2, and trace edema both ankles  ABD:  soft and nontender with nl inspiratory excursion in the supine position. No bruits or organomegaly appreciated, bowel sounds nl  MS:  Nl gait/ ext  warm without deformities, calf tenderness, cyanosis or clubbing No obvious joint restrictions   SKIN: warm and dry without lesions    NEURO:  alert, approp, nl sensorium with  no motor or cerebellar deficits apparent.     CXR PA and Lateral:   06/13/2018 :    I personally reviewed images and agree with radiology impression as follows:   The patient has a RIGHT-sided transvenous pacemaker with leads to the RIGHT atrium and RIGHT ventricle. The lungs are hyperinflated. Perihilar peribronchial thickening is present and stable. Pleural changes at the RIGHT lung base are chronic. There is no new consolidation. Multiple thoracic wedge compression fractures.      Assessment   Abnormal CXR S/p RUL pna 12/2017 with min residual R Pleural effusion vs scarring   His serial studies are c/w CAP RUL with parapneumonic process with/ without component of chf but at this point the issue is moot and he is back to baseline activity tol s residual cough and well compensated chf clinically   Should be become limited  by doe or develop recurrent cough would rec return to pulmonary clinic for re-evaluation but at age 78 he has made a remarkable recovery at this point and is getting excellent care from PCP/ cards and nothing additional to offer from this clinic so pulmonary f/u is prn     Total time devoted to counseling  > 50 % of initial 60 min office visit:  review extensive case hx  with pt/sone Ricky [name] with  discussion of options/alternatives/ personally creating written customized instructions  in presence of pt  then going over those specific  Instructions directly with the pt including how to use all of the meds but in particular covering each new medication in detail and the difference between the maintenance= "automatic" meds and the prns using an action plan format for the latter (If this problem/symptom => do that organization reading Left to right).  Please see AVS from this visit for a full list of these instructions which I personally wrote for this pt and  are unique to this visit.      Christinia Gully, MD 06/13/2018

## 2018-06-13 NOTE — Patient Instructions (Signed)
Please remember to go to the  x-ray department downstairs in the basement  for your tests - we will call you with the results when they are available.  Pulmonary follow up is as needed     

## 2018-06-13 NOTE — Progress Notes (Signed)
Spoke with pt and notified of results per Dr. Wert. Pt verbalized understanding and denied any questions. 

## 2018-06-15 ENCOUNTER — Encounter: Payer: Self-pay | Admitting: Internal Medicine

## 2018-06-15 NOTE — Assessment & Plan Note (Signed)
S/p RUL pna 12/2017 with min residual R Pleural effusion vs scarring   His serial studies are c/w CAP RUL with parapneumonic process with/ without component of chf but at this point the issue is moot and he is back to baseline activity tol s residual cough and well compensated chf clinically   Should be become limited by doe or develop recurrent cough would rec return to pulmonary clinic for re-evaluation but at age 84 he has made a remarkable recovery at this point and is getting excellent care from PCP/ cards and nothing additional to offer from this clinic so pulmonary f/u is prn     Total time devoted to counseling  > 50 % of initial 60 min office visit:  review extensive case hx  with pt/sone [name] with  discussion of options/alternatives/ personally creating written customized instructions  in presence of pt  then going over those specific  Instructions directly with the pt including how to use all of the meds but in particular covering each new medication in detail and the difference between the maintenance= "automatic" meds and the prns using an action plan format for the latter (If this problem/symptom => do that organization reading Left to right).  Please see AVS from this visit for a full list of these instructions which I personally wrote for this pt and  are unique to this visit.

## 2018-07-22 ENCOUNTER — Ambulatory Visit: Payer: Medicare HMO | Admitting: Internal Medicine

## 2018-08-11 ENCOUNTER — Encounter: Payer: Self-pay | Admitting: Internal Medicine

## 2018-08-11 ENCOUNTER — Other Ambulatory Visit (INDEPENDENT_AMBULATORY_CARE_PROVIDER_SITE_OTHER): Payer: Medicare HMO

## 2018-08-11 ENCOUNTER — Ambulatory Visit (INDEPENDENT_AMBULATORY_CARE_PROVIDER_SITE_OTHER): Payer: Medicare HMO | Admitting: Internal Medicine

## 2018-08-11 DIAGNOSIS — I10 Essential (primary) hypertension: Secondary | ICD-10-CM

## 2018-08-11 DIAGNOSIS — I5032 Chronic diastolic (congestive) heart failure: Secondary | ICD-10-CM | POA: Diagnosis not present

## 2018-08-11 DIAGNOSIS — I4821 Permanent atrial fibrillation: Secondary | ICD-10-CM | POA: Diagnosis not present

## 2018-08-11 DIAGNOSIS — R270 Ataxia, unspecified: Secondary | ICD-10-CM

## 2018-08-11 LAB — BASIC METABOLIC PANEL
BUN: 40 mg/dL — ABNORMAL HIGH (ref 6–23)
CALCIUM: 9.2 mg/dL (ref 8.4–10.5)
CO2: 31 meq/L (ref 19–32)
CREATININE: 1.26 mg/dL (ref 0.40–1.50)
Chloride: 101 mEq/L (ref 96–112)
GFR: 56.76 mL/min — AB (ref >60.00)
Glucose, Bld: 92 mg/dL (ref 70–99)
Potassium: 4.8 mEq/L (ref 3.5–5.1)
SODIUM: 139 meq/L (ref 135–145)

## 2018-08-11 MED ORDER — IRBESARTAN 150 MG PO TABS: 150.0000 mg | ORAL_TABLET | Freq: Every day | ORAL | 3 refills | Status: DC

## 2018-08-11 NOTE — Progress Notes (Signed)
Established Patient Office Visit  Subjective:  Patient ID: Ricky [name], male    DOB: 1923-05-09  Age: 84 y.o. MRN: [id number]  CC: No chief complaint on file.   HPI Ricky [name] presents for A fib, anticoagulation, CHF C/o leg swelling x chronic  Past Medical History:  Diagnosis Date  . ANXIETY 04/26/2008  . Atrial fibrillation ([place]) 10/31/2010   Coumadin therapy;  Echo 6/12: EF 50-55%, moderate MR, moderate TR, PASP 52, mild LAE  . BPH (benign prostatic hypertrophy)   . CAP (community acquired pneumonia) 12/2017  . CHF (congestive heart failure) ([place])   . Essential hypertension, benign 10/03/2007  . HTN (hypertension) 06/07/2013  . HYPOTHYROIDISM 09/04/2007  . LYMPHADENOPATHY 04/06/2009  . Nocturia   . Pacemaker    Status post AV nodal ablation  . TOBACCO USE, QUIT 07/14/2009    Past Surgical History:  Procedure Laterality Date  . COLONOSCOPY    . EYE SURGERY  2000   eye muscle release  . EYE SURGERY     both cataracts  . HERNIA REPAIR    . INGUINAL HERNIA REPAIR Right 05/06/2013   Procedure: HERNIA REPAIR INGUINAL ADULT;  Surgeon: Haywood Lasso, MD;  Location: [facility];  Service: General;  Laterality: Right;  . PACEMAKER INSERTION  10/16/05  . PPM GENERATOR CHANGEOUT N/A 10/31/2016   Procedure: PPM Generator Changeout;  Surgeon: Deboraha Sprang, MD;  Location: [facility];  Service: Cardiovascular;  Laterality: N/A;    Family History  Problem Relation Age of Onset  . Hypertension Mother   . Cancer Mother        uterine/cervical  . Heart disease Father   . Hypertension Other     Social History   Socioeconomic History  . Marital status: Widowed    Spouse name: Not on file  . Number of children: Not on file  . Years of education: Not on file  . Highest education level: Not on file  Occupational History  . Not on file  Social Needs  . Financial resource strain: Not on file  . Food insecurity:    Worry: Not on file   Inability: Not on file  . Transportation needs:    Medical: Not on file    Non-medical: Not on file  Tobacco Use  . Smoking status: Former Smoker    Packs/day: 1.00    Years: 30.00    Pack years: 30.00    Types: Cigarettes    Last attempt to quit: 05/01/1979    Years since quitting: 39.3  . Smokeless tobacco: Never Used  Substance and Sexual Activity  . Alcohol use: No    Alcohol/week: 0.0 standard drinks  . Drug use: No  . Sexual activity: Never  Lifestyle  . Physical activity:    Days per week: Not on file    Minutes per session: Not on file  . Stress: Not on file  Relationships  . Social connections:    Talks on phone: Not on file    Gets together: Not on file    Attends religious service: Not on file    Active member of club or organization: Not on file    Attends meetings of clubs or organizations: Not on file    Relationship status: Not on file  . Intimate partner violence:    Fear of current or ex partner: Not on file    Emotionally abused: Not on file    Physically abused: Not on file  Forced sexual activity: Not on file  Other Topics Concern  . Not on file  Social History Narrative   Lost his wife 2002 ; Her health was not good.    Outpatient Medications Prior to Visit  Medication Sig Dispense Refill  . apixaban (ELIQUIS) 5 MG TABS tablet Take 1 tablet (5 mg total) by mouth 2 (two) times daily. 180 tablet 3  . Ascorbic Acid (VITAMIN C PO) Take 1 tablet by mouth daily.    . Carboxymethylcellul-Glycerin (LUBRICATING EYE DROPS OP) Apply 1 drop to eye 5 (five) times daily.    . carvedilol (COREG) 12.5 MG tablet TAKE 1 TABLET BY MOUTH TWICE DAILY 180 tablet 1  . Cholecalciferol 1000 UNITS tablet Take 1,000 Units by mouth daily. Reported on 12/23/2015    . EPINEPHrine (EPIPEN 2-PAK) 0.3 mg/0.3 mL DEVI Inject 0.3 mg into the muscle daily as needed (allergic reaction). For allergic reaction    . irbesartan (AVAPRO) 300 MG tablet Take 0.5 tablets (150 mg total) by  mouth at bedtime. 45 tablet 3  . levothyroxine (SYNTHROID, LEVOTHROID) 75 MCG tablet Take 75 mcg by mouth daily.    . silver sulfADIAZINE (SILVADENE) 1 % cream Apply 1 application topically daily. 25 g 0  . spironolactone (ALDACTONE) 25 MG tablet Take 12.5 mg by mouth.     . torsemide (DEMADEX) 20 MG tablet Take 1 tablet (20 mg total) by mouth 2 (two) times daily. 60 tablet 0  . diphenhydrAMINE (BENADRYL) 25 mg capsule Take 1-2 capsules (25-50 mg total) by mouth every 4 (four) hours as needed for itching or allergies (1-2 for bee stings). 60 capsule 2  . potassium chloride (KLOR-CON) 8 MEQ tablet Take 1 tablet (8 mEq total) by mouth 2 (two) times daily. (Patient not taking: Reported on 08/11/2018) 180 tablet 3   No facility-administered medications prior to visit.     Allergies  Allergen Reactions  . Bee Venom Anaphylaxis  . Amiodarone Hcl     Weakness in muscles   . Atorvastatin     unknown  . Benazepril Hcl     unknown  . Clindamycin/Lincomycin Other (See Comments)    Abdominal discomfort   . Clonidine Hydrochloride     unknown  . Colesevelam     unknown  . Diltiazem Hcl     unknown  . Ezetimibe     Unknown   . Levaquin [Levofloxacin In D5w] Other (See Comments)    Halucinations  . Verapamil Other (See Comments)    unknown  . Amoxicillin Rash    Has patient had a PCN reaction causing immediate rash, facial/tongue/throat swelling, SOB or lightheadedness with hypotension: Yes Has patient had a PCN reaction causing severe rash involving mucus membranes or skin necrosis: No Has patient had a PCN reaction that required hospitalization Unknow Has patient had a PCN reaction occurring within the last 10 years: Unknown If all of the above answers are "NO", then may proceed with Cephalosporin use.     ROS Review of Systems  Constitutional: Positive for fatigue. Negative for appetite change and unexpected weight change.  HENT: Negative for congestion, nosebleeds, sneezing, sore  throat and trouble swallowing.   Eyes: Negative for itching and visual disturbance.  Respiratory: Negative for cough.   Cardiovascular: Positive for leg swelling. Negative for chest pain and palpitations.  Gastrointestinal: Negative for abdominal distention, blood in stool, diarrhea and nausea.  Genitourinary: Negative for frequency and hematuria.  Musculoskeletal: Positive for arthralgias. Negative for back pain, gait problem, joint swelling  and neck pain.  Skin: Negative for rash.  Neurological: Negative for dizziness, tremors, speech difficulty and weakness.  Psychiatric/Behavioral: Negative for agitation, dysphoric mood and sleep disturbance. The patient is not nervous/anxious.       Objective:    Physical Exam  Constitutional: He is oriented to person, place, and time. He appears well-developed. No distress.  NAD  HENT:  Mouth/Throat: Oropharynx is clear and moist.  Eyes: Pupils are equal, round, and reactive to light. Conjunctivae are normal.  Neck: Normal range of motion. No JVD present. No thyromegaly present.  Cardiovascular: Normal rate, regular rhythm, normal heart sounds and intact distal pulses. Exam reveals no gallop and no friction rub.  No murmur heard. Pulmonary/Chest: Effort normal and breath sounds normal. No respiratory distress. He has no wheezes. He has no rales. He exhibits no tenderness.  Abdominal: Soft. Bowel sounds are normal. He exhibits no distension and no mass. There is no tenderness. There is no rebound and no guarding.  Musculoskeletal: Normal range of motion. He exhibits edema. He exhibits no tenderness.  Lymphadenopathy:    He has no cervical adenopathy.  Neurological: He is alert and oriented to person, place, and time. He has normal reflexes. No cranial nerve deficit. He exhibits normal muscle tone. He displays a negative Romberg sign. Coordination and gait normal.  Skin: Skin is warm and dry. No rash noted.  Psychiatric: He has a normal mood and  affect. His behavior is normal. Judgment and thought content normal.  trace edema B L>R  BP 136/74 (BP Location: Left Arm, Patient Position: Sitting, Cuff Size: Normal)   Pulse 61   Temp 98 F (36.7 C) (Oral)   Ht 5' 6.75" (1.695 m)   Wt 158 lb (71.7 kg)   SpO2 99%   BMI 24.93 kg/m  Wt Readings from Last 3 Encounters:  08/11/18 158 lb (71.7 kg)  06/13/18 150 lb (68 kg)  05/30/18 154 lb 1.9 oz (69.9 kg)     There are no preventive care reminders to display for this patient.  There are no preventive care reminders to display for this patient.  Lab Results  Component Value Date   TSH 1.11 07/30/2017   Lab Results  Component Value Date   WBC 7.1 03/21/2018   HGB 11.8 (L) 03/21/2018   HCT 35.0 (L) 03/21/2018   MCV 100.6 (H) 03/21/2018   PLT 360.0 03/21/2018   Lab Results  Component Value Date   NA 137 03/21/2018   K 4.5 03/21/2018   CO2 33 (H) 03/21/2018   GLUCOSE 95 03/21/2018   BUN 33 (H) 03/21/2018   CREATININE 0.96 03/21/2018   BILITOT 0.7 02/18/2018   ALKPHOS 79 02/18/2018   AST 15 02/18/2018   ALT 10 02/18/2018   PROT 6.5 02/18/2018   ALBUMIN 3.2 (L) 02/18/2018   CALCIUM 8.8 03/21/2018   ANIONGAP 7 02/13/2018   GFR 77.75 03/21/2018   Lab Results  Component Value Date   CHOL 210 (H) 12/28/2013   Lab Results  Component Value Date   HDL 63.40 12/28/2013   Lab Results  Component Value Date   LDLCALC 135 (H) 12/28/2013   Lab Results  Component Value Date   TRIG 59.0 12/28/2013   Lab Results  Component Value Date   CHOLHDL 3 12/28/2013   No results found for: HGBA1C    Assessment & Plan:   Problem List Items Addressed This Visit    None      No orders of the defined types were  placed in this encounter.   Follow-up: No follow-ups on file.    Walker Kehr, MD

## 2018-08-11 NOTE — Assessment & Plan Note (Signed)
Edema. Increase torsemide to 50 mg on some days

## 2018-08-11 NOTE — Assessment & Plan Note (Signed)
Eliquis 

## 2018-08-11 NOTE — Assessment & Plan Note (Signed)
walker

## 2018-08-11 NOTE — Assessment & Plan Note (Signed)
Coreg, Demadex, Losartan

## 2018-08-22 ENCOUNTER — Encounter: Payer: Self-pay | Admitting: Internal Medicine

## 2018-08-22 ENCOUNTER — Ambulatory Visit: Payer: Medicare HMO | Admitting: Internal Medicine

## 2018-08-22 VITALS — BP 118/60 | HR 65 | Ht 66.75 in | Wt 159.2 lb

## 2018-08-22 DIAGNOSIS — I4821 Permanent atrial fibrillation: Secondary | ICD-10-CM | POA: Diagnosis not present

## 2018-08-22 DIAGNOSIS — I442 Atrioventricular block, complete: Secondary | ICD-10-CM

## 2018-08-22 DIAGNOSIS — Z950 Presence of cardiac pacemaker: Secondary | ICD-10-CM | POA: Diagnosis not present

## 2018-08-22 LAB — CUP PACEART INCLINIC DEVICE CHECK
Battery Voltage: 2.96 V
Brady Statistic RV Percent Paced: 99.78 %
Date Time Interrogation Session: 20191206132408
Implantable Lead Implant Date: 19980824
Implantable Lead Implant Date: 19980824
Implantable Lead Location: 753859
Implantable Lead Location: 753860
Implantable Pulse Generator Implant Date: 20180214
Lead Channel Impedance Value: 325 Ohm
Lead Channel Impedance Value: 350 Ohm
Lead Channel Pacing Threshold Amplitude: 1 V
Lead Channel Pacing Threshold Pulse Width: 0.4 ms
Lead Channel Pacing Threshold Pulse Width: 0.8 ms
Lead Channel Setting Pacing Amplitude: 2.5 V
Lead Channel Setting Pacing Pulse Width: 0.8 ms
Lead Channel Setting Sensing Sensitivity: 6 mV
MDC IDC MSMT BATTERY REMAINING LONGEVITY: 76 mo
MDC IDC MSMT LEADCHNL RA PACING THRESHOLD AMPLITUDE: 1 V
MDC IDC MSMT LEADCHNL RA SENSING INTR AMPL: 4.5 mV
MDC IDC MSMT LEADCHNL RV SENSING INTR AMPL: 12 mV
MDC IDC SET LEADCHNL RA PACING AMPLITUDE: 2 V
MDC IDC STAT BRADY RA PERCENT PACED: 92 %
Pulse Gen Model: 2272
Pulse Gen Serial Number: 7998385

## 2018-08-22 NOTE — Patient Instructions (Addendum)
Medication Instructions:  Your physician recommends that you continue on your current medications as directed. Please refer to the Current Medication list given to you today.  Labwork: You will have labs drawn today: CBC and BMP  Testing/Procedures: None ordered.  Follow-Up: Your physician recommends that you schedule a follow-up appointment in:   6 months with Chanetta Marshall, NP  Remote monitoring is used to monitor your Pacemaker from home. This monitoring reduces the number of office visits required to check your device to one time per year. It allows [place] to keep an eye on the functioning of your device to ensure it is working properly. You are scheduled for a device check from home on 08/29/18. You may send your transmission at any time that day. If you have a wireless device, the transmission will be sent automatically. After your physician reviews your transmission, you will receive a postcard with your next transmission date.    Any Other Special Instructions Will Be Listed Below (If Applicable).     If you need a refill on your cardiac medications before your next appointment, please call your pharmacy.

## 2018-08-22 NOTE — Progress Notes (Signed)
Patient Care Team: Cassandria Anger, MD as PCP - General Deboraha Sprang, MD as PCP - Cardiology (Cardiology) Deboraha Sprang, MD (Cardiology) Clarene Essex, MD as Consulting Physician (Gastroenterology)   HPI  Ricky [name] is a 84 y.o. male is seen in followup for atrial fibrillation that is permanent. He is status post AV junction ablation following 2 failed Pulmonary Vein ablation procedures. He is status post pacemaker implantation and is device dependent.   His device reached ERI January 2018 and he underwent replacement 2/18         DATE TEST EF   2/14 Echo   60 % LAE   5/19 Echo    65 % (LAE 35/1.9/20)          Has had a interval hospitalization for pneumonia as well as been seen by [facility] PA for diastolic heart failure   He saw his PCP.  Chest x-ray demonstrated ulnar edema and BNP was over 500.  Date Cr K Hgb  5/19 0.88    13.7  7/19     11.8  11/19 1.26 4.8       Edema is at baseline.  Breathing is at baseline.  He brings in lab work with concerned about his modest change in creatinine  Past Medical History:  Diagnosis Date  . ANXIETY 04/26/2008  . Atrial fibrillation ([place]) 10/31/2010   Coumadin therapy;  Echo 6/12: EF 50-55%, moderate MR, moderate TR, PASP 52, mild LAE  . BPH (benign prostatic hypertrophy)   . CAP (community acquired pneumonia) 12/2017  . CHF (congestive heart failure) ([place])   . Essential hypertension, benign 10/03/2007  . HTN (hypertension) 06/07/2013  . HYPOTHYROIDISM 09/04/2007  . LYMPHADENOPATHY 04/06/2009  . Nocturia   . Pacemaker    Status post AV nodal ablation  . TOBACCO USE, QUIT 07/14/2009    Past Surgical History:  Procedure Laterality Date  . COLONOSCOPY    . EYE SURGERY  2000   eye muscle release  . EYE SURGERY     both cataracts  . HERNIA REPAIR    . INGUINAL HERNIA REPAIR Right 05/06/2013   Procedure: HERNIA REPAIR INGUINAL ADULT;  Surgeon: Haywood Lasso, MD;  Location: [facility];   Service: General;  Laterality: Right;  . PACEMAKER INSERTION  10/16/05  . PPM GENERATOR CHANGEOUT N/A 10/31/2016   Procedure: PPM Generator Changeout;  Surgeon: Deboraha Sprang, MD;  Location: [facility];  Service: Cardiovascular;  Laterality: N/A;    Current Outpatient Medications  Medication Sig Dispense Refill  . apixaban (ELIQUIS) 5 MG TABS tablet Take 1 tablet (5 mg total) by mouth 2 (two) times daily. 180 tablet 3  . Ascorbic Acid (VITAMIN C PO) Take 1 tablet by mouth daily.    . Carboxymethylcellul-Glycerin (LUBRICATING EYE DROPS OP) Apply 1 drop to eye 5 (five) times daily.    . carvedilol (COREG) 12.5 MG tablet TAKE 1 TABLET BY MOUTH TWICE DAILY 180 tablet 1  . Cholecalciferol 1000 UNITS tablet Take 1,000 Units by mouth daily. Reported on 12/23/2015    . diphenhydrAMINE (BENADRYL) 25 mg capsule Take 1-2 capsules (25-50 mg total) by mouth every 4 (four) hours as needed for itching or allergies (1-2 for bee stings). 60 capsule 2  . EPINEPHrine (EPIPEN 2-PAK) 0.3 mg/0.3 mL DEVI Inject 0.3 mg into the muscle daily as needed (allergic reaction). For allergic reaction    . irbesartan (AVAPRO) 150 MG tablet Take 1 tablet (150  mg total) by mouth daily. 90 tablet 3  . levothyroxine (SYNTHROID, LEVOTHROID) 75 MCG tablet Take 75 mcg by mouth daily.    . silver sulfADIAZINE (SILVADENE) 1 % cream Apply 1 application topically daily. 25 g 0  . spironolactone (ALDACTONE) 25 MG tablet Take 12.5 mg by mouth.     . torsemide (DEMADEX) 20 MG tablet Take 1 tablet (20 mg total) by mouth 2 (two) times daily. 60 tablet 0   No current facility-administered medications for this visit.     Allergies  Allergen Reactions  . Bee Venom Anaphylaxis  . Amiodarone Hcl     Weakness in muscles   . Atorvastatin     unknown  . Benazepril Hcl     unknown  . Clindamycin/Lincomycin Other (See Comments)    Abdominal discomfort   . Clonidine Hydrochloride     unknown  . Colesevelam     unknown  . Diltiazem  Hcl     unknown  . Ezetimibe     Unknown   . Levaquin [Levofloxacin In D5w] Other (See Comments)    Halucinations  . Verapamil Other (See Comments)    unknown  . Amoxicillin Rash    Has patient had a PCN reaction causing immediate rash, facial/tongue/throat swelling, SOB or lightheadedness with hypotension: Yes Has patient had a PCN reaction causing severe rash involving mucus membranes or skin necrosis: No Has patient had a PCN reaction that required hospitalization Unknow Has patient had a PCN reaction occurring within the last 10 years: Unknown If all of the above answers are "NO", then may proceed with Cephalosporin use.     Review of Systems negative except from HPI and PMH  Physical Exam BP 118/60   Pulse 65   Ht 5' 6.75" (1.695 m)   Wt 159 lb 3.2 oz (72.2 kg)   SpO2 95%   BMI 25.12 kg/m  Well developed and nourished in no acute distress HENT normal Neck supple with JVP-flat Clear Regular rate and rhythm, no murmurs or gallops Abd-soft with active BS No Clubbing cyanosis 1+ edema Skin-warm and dry A & Oriented  Grossly normal sensory and motor function   ECG demonstrates sinus with V pacing  Assessment and  Plan  Atrial fibrillation-thought to be permanent but now with evidence of sinus rhythm  HFpEF  Complete heart block s/p AV ablation  Pacemaker-[employer] The patient's device was interrogated and the information was fully reviewed.  The patient turns out to be in sinus rhythm and the probe device was reprogrammed DDDR  Hypertension well controlled   No intercurrent atrial fibrillation or flutter  On Anticoagulation;  No bleeding issues   Functional status is stable.  Was concerned about his elevated creatinine and the new grade 3 renal failure.  Likely in the context of an elevated BUN secondary to the diuretics for his HFpEF.  We will recheck his creatinine today.  Hemoglobin had dropped earlier last summer.  We will recheck it.  We spent more  than 50% of our >25 min visit in face to face counseling regarding the above

## 2018-08-23 LAB — BASIC METABOLIC PANEL
BUN/Creatinine Ratio: 33 — ABNORMAL HIGH (ref 10–24)
BUN: 35 mg/dL (ref 10–36)
CALCIUM: 9.2 mg/dL (ref 8.6–10.2)
CO2: 26 mmol/L (ref 20–29)
CREATININE: 1.05 mg/dL (ref 0.76–1.27)
Chloride: 99 mmol/L (ref 96–106)
GFR calc Af Amer: 70 mL/min/{1.73_m2} (ref >59)
GFR calc non Af Amer: 61 mL/min/{1.73_m2} (ref >59)
GLUCOSE: 86 mg/dL (ref 65–99)
Potassium: 5 mmol/L (ref 3.5–5.2)
SODIUM: 140 mmol/L (ref 134–144)

## 2018-08-23 LAB — CBC
HEMOGLOBIN: 12.1 g/dL — AB (ref 13.0–17.7)
Hematocrit: 36.3 % — ABNORMAL LOW (ref 37.5–51.0)
MCH: 32.9 pg (ref 26.6–33.0)
MCHC: 33.3 g/dL (ref 31.5–35.7)
MCV: 99 fL — ABNORMAL HIGH (ref 79–97)
Platelets: 234 10*3/uL (ref 150–450)
RBC: 3.68 x10E6/uL — AB (ref 4.14–5.80)
RDW: 12.2 % — ABNORMAL LOW (ref 12.3–15.4)
WBC: 7.2 10*3/uL (ref 3.4–10.8)

## 2018-08-29 ENCOUNTER — Telehealth: Payer: Self-pay | Admitting: Cardiology

## 2018-08-29 ENCOUNTER — Ambulatory Visit (INDEPENDENT_AMBULATORY_CARE_PROVIDER_SITE_OTHER): Payer: Medicare HMO

## 2018-08-29 DIAGNOSIS — I442 Atrioventricular block, complete: Secondary | ICD-10-CM

## 2018-08-29 NOTE — Telephone Encounter (Signed)
Spoke with pt and reminded pt of remote transmission that is due today. Pt verbalized understanding.   

## 2018-09-01 ENCOUNTER — Telehealth: Payer: Self-pay

## 2018-09-01 NOTE — Progress Notes (Signed)
Remote pacemaker transmission.   

## 2018-09-01 NOTE — Telephone Encounter (Signed)
-----   Message from Deboraha Sprang, MD sent at 09/01/2018  9:26 AM EST ----- Please Inform Patient that labs are normal  Thanks

## 2018-09-01 NOTE — Telephone Encounter (Signed)
LMTCB

## 2018-09-02 ENCOUNTER — Encounter: Payer: Self-pay | Admitting: Cardiology

## 2018-09-05 NOTE — Telephone Encounter (Signed)
New message   Patient is returning call for lab results and would like a copy of his results.

## 2018-09-08 NOTE — Telephone Encounter (Signed)
Called and spoke with patient, he is aware that labs drawn on 08/23/18 were normal. Copy of labs printed and mailed to patient per patient request.

## 2018-10-11 LAB — CUP PACEART REMOTE DEVICE CHECK
Battery Remaining Longevity: 80 mo
Battery Remaining Percentage: 95.5 %
Battery Voltage: 2.96 V
Brady Statistic AP VP Percent: 95 %
Brady Statistic AS VS Percent: 1 %
Date Time Interrogation Session: 20191213150015
Implantable Lead Implant Date: 19980824
Implantable Lead Location: 753859
Implantable Pulse Generator Implant Date: 20180214
Lead Channel Impedance Value: 390 Ohm
Lead Channel Pacing Threshold Amplitude: 1 V
Lead Channel Pacing Threshold Pulse Width: 0.8 ms
Lead Channel Sensing Intrinsic Amplitude: 4.7 mV
Lead Channel Setting Sensing Sensitivity: 6 mV
MDC IDC LEAD IMPLANT DT: 19980824
MDC IDC LEAD LOCATION: 753860
MDC IDC MSMT LEADCHNL RA IMPEDANCE VALUE: 310 Ohm
MDC IDC MSMT LEADCHNL RA PACING THRESHOLD PULSEWIDTH: 0.4 ms
MDC IDC MSMT LEADCHNL RV PACING THRESHOLD AMPLITUDE: 1 V
MDC IDC MSMT LEADCHNL RV SENSING INTR AMPL: 12 mV
MDC IDC SET LEADCHNL RA PACING AMPLITUDE: 2 V
MDC IDC SET LEADCHNL RV PACING AMPLITUDE: 2.5 V
MDC IDC SET LEADCHNL RV PACING PULSEWIDTH: 0.8 ms
MDC IDC STAT BRADY AP VS PERCENT: 1 %
MDC IDC STAT BRADY AS VP PERCENT: 4.9 %
MDC IDC STAT BRADY RA PERCENT PACED: 95 %
MDC IDC STAT BRADY RV PERCENT PACED: 99 %
Pulse Gen Model: 2272
Pulse Gen Serial Number: 7998385

## 2018-11-07 NOTE — Progress Notes (Addendum)
Subjective:   Ricky [name] is a 85 y.o. male who presents for Medicare Annual/Subsequent preventive examination.  Review of Systems:  No ROS.  Medicare Wellness Visit. Additional risk factors are reflected in the social history.  Cardiac Risk Factors include: advanced age (>7men, >37 women);hypertension;male gender Sleep patterns: feels rested on waking, gets up 1 times nightly to void and sleeps 9-10 hours nightly.    Home Safety/Smoke Alarms: Feels safe in home. Smoke alarms in place.  Living environment; residence and Firearm Safety: 1-story house/ trailer, equipment: Walkers, Type: Wellsite geologist, Type: Tub Surveyor, quantity. Seat Belt Safety/Bike Helmet: Wears seat belt. Lives with daughter, no needs for DME, good support system   PSA-  Lab Results  Component Value Date   PSA 0.84 12/28/2013   PSA 0.81 02/20/2012   PSA 0.89 01/04/2011       Objective:    Vitals: BP (!) 154/86   Pulse 72   Ht 5\' 7"  (1.702 m)   Wt 156 lb (70.8 kg)   BMI 24.43 kg/m   Body mass index is 24.43 kg/m.  Advanced Directives 11/10/2018 02/10/2018 12/22/2017 10/31/2016 04/16/2016 09/14/2015 04/15/2015  Does Patient Have a Medical Advance Directive? Yes Yes Yes Yes Yes No Yes  Type of Paramedic of [place];Living will Healthcare Power of [place] of [place];Living will - - -  Does patient want to make changes to medical advance directive? - No - Patient declined No - Patient declined No - Patient declined - - -  Copy of [place] in Chart? No - copy requested - - Yes No - copy requested - -  Would patient like information on creating a medical advance directive? - - - - - No - patient declined information -    Tobacco Social History   Tobacco Use  Smoking Status Former Smoker  . Packs/day: 1.00  . Years: 30.00  . Pack years: 30.00  . Types: Cigarettes  . Last attempt to quit: 05/01/1979  .  Years since quitting: 39.5  Smokeless Tobacco Never Used     Counseling given: Not Answered  Past Medical History:  Diagnosis Date  . ANXIETY 04/26/2008  . Atrial fibrillation ([place]) 10/31/2010   Coumadin therapy;  Echo 6/12: EF 50-55%, moderate MR, moderate TR, PASP 52, mild LAE  . BPH (benign prostatic hypertrophy)   . CAP (community acquired pneumonia) 12/2017  . CHF (congestive heart failure) ([place])   . Essential hypertension, benign 10/03/2007  . HTN (hypertension) 06/07/2013  . HYPOTHYROIDISM 09/04/2007  . LYMPHADENOPATHY 04/06/2009  . Nocturia   . Pacemaker    Status post AV nodal ablation  . TOBACCO USE, QUIT 07/14/2009   Past Surgical History:  Procedure Laterality Date  . COLONOSCOPY    . EYE SURGERY  2000   eye muscle release  . EYE SURGERY     both cataracts  . HERNIA REPAIR    . INGUINAL HERNIA REPAIR Right 05/06/2013   Procedure: HERNIA REPAIR INGUINAL ADULT;  Surgeon: Haywood Lasso, MD;  Location: [facility];  Service: General;  Laterality: Right;  . PACEMAKER INSERTION  10/16/05  . PPM GENERATOR CHANGEOUT N/A 10/31/2016   Procedure: PPM Generator Changeout;  Surgeon: Deboraha Sprang, MD;  Location: [facility];  Service: Cardiovascular;  Laterality: N/A;   Family History  Problem Relation Age of [place]  . Hypertension Mother   . Cancer Mother  uterine/cervical  . Heart disease Father   . Hypertension Other    Social History   Socioeconomic History  . Marital status: Widowed    Spouse name: Not on file  . Number of children: 2  . Years of education: Not on file  . Highest education level: Not on file  Occupational History  . Not on file  Social Needs  . Financial resource strain: Not hard at all  . Food insecurity:    Worry: Never true    Inability: Never true  . Transportation needs:    Medical: No    Non-medical: No  Tobacco Use  . Smoking status: Former Smoker    Packs/day: 1.00    Years: 30.00    Pack years:  30.00    Types: Cigarettes    Last attempt to quit: 05/01/1979    Years since quitting: 39.5  . Smokeless tobacco: Never Used  Substance and Sexual Activity  . Alcohol use: No    Alcohol/week: 0.0 standard drinks  . Drug use: No  . Sexual activity: Never  Lifestyle  . Physical activity:    Days per week: 6 days    Minutes per session: 40 min  . Stress: Not at all  Relationships  . Social connections:    Talks on phone: More than three times a week    Gets together: More than three times a week    Attends religious service: Never    Active member of club or organization: No    Attends meetings of clubs or organizations: Never    Relationship status: Widowed  Other Topics Concern  . Not on file  Social History Narrative   Lost his wife 2002 ; Her health was not good.    Outpatient Encounter Medications as of 11/10/2018  Medication Sig  . apixaban (ELIQUIS) 5 MG TABS tablet Take 1 tablet (5 mg total) by mouth 2 (two) times daily.  . Ascorbic Acid (VITAMIN C PO) Take 1 tablet by mouth daily.  . Carboxymethylcellul-Glycerin (LUBRICATING EYE DROPS OP) Apply 1 drop to eye 5 (five) times daily.  . carvedilol (COREG) 12.5 MG tablet TAKE 1 TABLET BY MOUTH TWICE DAILY  . Cholecalciferol 1000 UNITS tablet Take 1,000 Units by mouth daily. Reported on 12/23/2015  . diphenhydrAMINE (BENADRYL) 25 mg capsule Take 1-2 capsules (25-50 mg total) by mouth every 4 (four) hours as needed for itching or allergies (1-2 for bee stings).  . EPINEPHrine (EPIPEN 2-PAK) 0.3 mg/0.3 mL DEVI Inject 0.3 mg into the muscle daily as needed (allergic reaction). For allergic reaction  . irbesartan (AVAPRO) 150 MG tablet Take 1 tablet (150 mg total) by mouth daily.  Marland Kitchen levothyroxine (SYNTHROID, LEVOTHROID) 75 MCG tablet Take 75 mcg by mouth daily.  . silver sulfADIAZINE (SILVADENE) 1 % cream Apply 1 application topically daily.  Marland Kitchen spironolactone (ALDACTONE) 25 MG tablet Take 12.5 mg by mouth.   . torsemide (DEMADEX)  20 MG tablet Take 1 tablet (20 mg total) by mouth 2 (two) times daily.   No facility-administered encounter medications on file as of 11/10/2018.     Activities of Daily Living In your present state of health, do you have any difficulty performing the following activities: 11/10/2018 02/10/2018  Hearing? N N  Vision? N N  Difficulty concentrating or making decisions? N N  Walking or climbing stairs? Y Y  Dressing or bathing? Y Y  Doing errands, shopping? Y -  Conservation officer, nature and eating ? Y -  Using the Toilet?  N -  In the past six months, have you accidently leaked urine? N -  Do you have problems with loss of bowel control? N -  Managing your Medications? Y -  Managing your Finances? Y -  Housekeeping or managing your Housekeeping? Y -  Some recent data might be hidden    Patient Care Team: Plotnikov, Evie Lacks, MD as PCP - General Deboraha Sprang, MD as PCP - Cardiology (Cardiology) Deboraha Sprang, MD (Cardiology) Clarene Essex, MD as Consulting Physician (Gastroenterology)   Assessment:   This is a routine wellness examination for K[place]. Physical assessment deferred to PCP.  Exercise Activities and Dietary recommendations Current Exercise Habits: Home exercise routine, Type of exercise: calisthenics;walking, Time (Minutes): 40, Frequency (Times/Week): 6, Weekly Exercise (Minutes/Week): 240, Intensity: Mild, Exercise limited by: orthopedic condition(s)  Diet (meal preparation, eat out, water intake, caffeinated beverages, dairy products, fruits and vegetables): in general, a "healthy" diet  , well balanced, on average, 3 meals per day, low fat/ cholesterol, low salt Reports good appetite. Encouraged patient to increase daily water and healthy fluid intake.  Goals      Patient Stated   . patient (pt-stated)     Wants to continue current exercise regiment; eat healthy and to maintain his health      Other   . <enter goal here>     Will increase biking as he can; does not  overdo!    . Exercise 150 minutes per week (moderate activity)       Fall Risk Fall Risk  11/10/2018 08/11/2018 04/29/2017 04/16/2016 04/15/2015  Falls in the past year? 0 0 No No No  Number falls in past yr: 0 - - - -  Risk for fall due to : Impaired balance/gait;Impaired mobility - - - -  Follow up Falls prevention discussed Falls evaluation completed - - -    Depression Screen PHQ 2/9 Scores 11/10/2018 08/11/2018 04/29/2017 04/16/2016  PHQ - 2 Score 0 0 0 0    Cognitive Function MMSE - Mini Mental State Exam 04/16/2016 04/15/2015  Not completed: (No Data) Unable to complete       Ad8 score reviewed for issues:  Issues making decisions: no  Less interest in hobbies / activities: no  Repeats questions, stories (family complaining): no  Trouble using ordinary gadgets (microwave, computer, phone):no  Forgets the month or year: no  Mismanaging finances: no  Remembering appts: no  Daily problems with thinking and/or memory: no Ad8 score is= 0  Immunization History  Administered Date(s) Administered  . Influenza Split 07/03/2011, 06/25/2012  . Influenza Whole 05/31/2010  . Influenza, High Dose Seasonal PF 06/10/2015, 05/30/2016, 06/25/2017, 06/13/2018  . Influenza,inj,Quad PF,6+ Mos 05/28/2013, 05/04/2014  . Pneumococcal Conjugate-13 12/30/2013  . Pneumococcal Polysaccharide-23 06/19/2006, 04/27/2016  . Td 01/10/2010  . Tdap 12/28/2016  . Zoster 02/12/2007   Screening Tests Health Maintenance  Topic Date Due  . TETANUS/TDAP  12/29/2026  . INFLUENZA VACCINE  Completed  . PNA vac Low Risk Adult  Completed      Plan:     Reviewed health maintenance screenings with patient today and relevant education, vaccines, and/or referrals were provided.   Continue doing brain stimulating activities (puzzles, reading, adult coloring books, staying active) to keep memory sharp.   Continue to eat heart healthy diet (full of fruits, vegetables, whole grains, lean protein,  water--limit salt, fat, and sugar intake) and increase physical activity as tolerated.  I have personally reviewed and noted the following in the  patient's chart:   . Medical and social history . Use of alcohol, tobacco or illicit drugs  . Current medications and supplements . Functional ability and status . Nutritional status . Physical activity . Advanced directives . List of other physicians . Vitals . Screenings to include cognitive, depression, and falls . Referrals and appointments  In addition, I have reviewed and discussed with patient certain preventive protocols, quality metrics, and best practice recommendations. A written personalized care plan for preventive services as well as general preventive health recommendations were provided to patient.     Michiel Cowboy, RN  11/10/2018  Medical screening examination/treatment/procedure(s) were performed by non-physician practitioner and as supervising physician I was immediately available for consultation/collaboration. I agree with above. Lew Dawes, MD

## 2018-11-10 ENCOUNTER — Ambulatory Visit (INDEPENDENT_AMBULATORY_CARE_PROVIDER_SITE_OTHER): Payer: Medicare HMO | Admitting: Internal Medicine

## 2018-11-10 ENCOUNTER — Encounter: Payer: Self-pay | Admitting: Internal Medicine

## 2018-11-10 ENCOUNTER — Ambulatory Visit (INDEPENDENT_AMBULATORY_CARE_PROVIDER_SITE_OTHER): Payer: Medicare HMO | Admitting: *Deleted

## 2018-11-10 VITALS — BP 154/86 | HR 72 | Ht 67.0 in | Wt 156.0 lb

## 2018-11-10 VITALS — BP 154/86 | HR 72 | Temp 98.0°F | Ht 66.75 in | Wt 156.0 lb

## 2018-11-10 DIAGNOSIS — Z0000 Encounter for general adult medical examination without abnormal findings: Secondary | ICD-10-CM

## 2018-11-10 DIAGNOSIS — L570 Actinic keratosis: Secondary | ICD-10-CM

## 2018-11-10 DIAGNOSIS — L600 Ingrowing nail: Secondary | ICD-10-CM | POA: Diagnosis not present

## 2018-11-10 DIAGNOSIS — I4821 Permanent atrial fibrillation: Secondary | ICD-10-CM

## 2018-11-10 NOTE — Patient Instructions (Signed)
Continue doing brain stimulating activities (puzzles, reading, adult coloring books, staying active) to keep memory sharp.   Continue to eat heart healthy diet (full of fruits, vegetables, whole grains, lean protein, water--limit salt, fat, and sugar intake) and increase physical activity as tolerated.   Preventive Care 83 Years and Older, Male Preventive care refers to lifestyle choices and visits with your health care provider that can promote health and wellness. What does preventive care include?   A yearly physical exam. This is also called an annual well check.  Dental exams once or twice a year.  Routine eye exams. Ask your health care provider how often you should have your eyes checked.  Personal lifestyle choices, including: ? Daily care of your teeth and gums. ? Regular physical activity. ? Eating a healthy diet. ? Avoiding tobacco and drug use. ? Limiting alcohol use. ? Practicing safe sex. ? Taking low doses of aspirin every day. ? Taking vitamin and mineral supplements as recommended by your health care provider. What happens during an annual well check? The services and screenings done by your health care provider during your annual well check will depend on your age, overall health, lifestyle risk factors, and family history of disease. Counseling Your health care provider may ask you questions about your:  Alcohol use.  Tobacco use.  Drug use.  Emotional well-being.  Home and relationship well-being.  Sexual activity.  Eating habits.  History of falls.  Memory and ability to understand (cognition).  Work and work Statistician. Screening You may have the following tests or measurements:  Height, weight, and BMI.  Blood pressure.  Lipid and cholesterol levels. These may be checked every 5 years, or more frequently if you are over 42 years old.  Skin check.  Lung cancer screening. You may have this screening every year starting at age 32 if you  have a 30-pack-year history of smoking and currently smoke or have quit within the past 15 years.  Colorectal cancer screening. All adults should have this screening starting at age 57 and continuing until age 17. You will have tests every 1-10 years, depending on your results and the type of screening test. People at increased risk should start screening at an earlier age. Screening tests may include: ? Guaiac-based fecal occult blood testing. ? Fecal immunochemical test (FIT). ? Stool DNA test. ? Virtual colonoscopy. ? Sigmoidoscopy. During this test, a flexible tube with a tiny camera (sigmoidoscope) is used to examine your rectum and lower colon. The sigmoidoscope is inserted through your anus into your rectum and lower colon. ? Colonoscopy. During this test, a long, thin, flexible tube with a tiny camera (colonoscope) is used to examine your entire colon and rectum.  Prostate cancer screening. Recommendations will vary depending on your family history and other risks.  Hepatitis C blood test.  Hepatitis B blood test.  Sexually transmitted disease (STD) testing.  Diabetes screening. This is done by checking your blood sugar (glucose) after you have not eaten for a while (fasting). You may have this done every 1-3 years.  Abdominal aortic aneurysm (AAA) screening. You may need this if you are a current or former smoker.  Osteoporosis. You may be screened starting at age 4 if you are at high risk. Talk with your health care provider about your test results, treatment options, and if necessary, the need for more tests. Vaccines Your health care provider may recommend certain vaccines, such as:  Influenza vaccine. This is recommended every year.  Tetanus,  diphtheria, and acellular pertussis (Tdap, Td) vaccine. You may need a Td booster every 10 years.  Varicella vaccine. You may need this if you have not been vaccinated.  Zoster vaccine. You may need this after age 62.  Measles,  mumps, and rubella (MMR) vaccine. You may need at least one dose of MMR if you were born in 1957 or later. You may also need a second dose.  Pneumococcal 13-valent conjugate (PCV13) vaccine. One dose is recommended after age 9.  Pneumococcal polysaccharide (PPSV23) vaccine. One dose is recommended after age 48.  Meningococcal vaccine. You may need this if you have certain conditions.  Hepatitis A vaccine. You may need this if you have certain conditions or if you travel or work in places where you may be exposed to hepatitis A.  Hepatitis B vaccine. You may need this if you have certain conditions or if you travel or work in places where you may be exposed to hepatitis B.  Haemophilus influenzae type b (Hib) vaccine. You may need this if you have certain risk factors. Talk to your health care provider about which screenings and vaccines you need and how often you need them. This information is not intended to replace advice given to you by your health care provider. Make sure you discuss any questions you have with your health care provider. Document Released: 09/30/2015 Document Revised: 10/24/2017 Document Reviewed: 07/05/2015 Elsevier Interactive Patient Education  2019 [employer].

## 2018-11-10 NOTE — Assessment & Plan Note (Signed)
L forearm - see Cryo

## 2018-11-10 NOTE — Assessment & Plan Note (Addendum)
Eliquis - no bleeding problems   May need to switch to 2.5 mg bid in the future. Monitoring creatinine

## 2018-11-10 NOTE — Assessment & Plan Note (Signed)
L big toe See Procedure Podiatry ref

## 2018-11-10 NOTE — Progress Notes (Signed)
Subjective:  Patient ID: Ricky [name], male    DOB: [birth year]  Age: 85 y.o. MRN: [id number]  CC: No chief complaint on file.   HPI Ricky [name] presents for A fib C/o toenail pain C/o skin lesion C/o ingrown toenail  Outpatient Medications Prior to Visit  Medication Sig Dispense Refill  . apixaban (ELIQUIS) 5 MG TABS tablet Take 1 tablet (5 mg total) by mouth 2 (two) times daily. 180 tablet 3  . Ascorbic Acid (VITAMIN C PO) Take 1 tablet by mouth daily.    . Carboxymethylcellul-Glycerin (LUBRICATING EYE DROPS OP) Apply 1 drop to eye 5 (five) times daily.    . carvedilol (COREG) 12.5 MG tablet TAKE 1 TABLET BY MOUTH TWICE DAILY 180 tablet 1  . Cholecalciferol 1000 UNITS tablet Take 1,000 Units by mouth daily. Reported on 12/23/2015    . diphenhydrAMINE (BENADRYL) 25 mg capsule Take 1-2 capsules (25-50 mg total) by mouth every 4 (four) hours as needed for itching or allergies (1-2 for bee stings). 60 capsule 2  . EPINEPHrine (EPIPEN 2-PAK) 0.3 mg/0.3 mL DEVI Inject 0.3 mg into the muscle daily as needed (allergic reaction). For allergic reaction    . irbesartan (AVAPRO) 150 MG tablet Take 1 tablet (150 mg total) by mouth daily. 90 tablet 3  . levothyroxine (SYNTHROID, LEVOTHROID) 75 MCG tablet Take 75 mcg by mouth daily.    . silver sulfADIAZINE (SILVADENE) 1 % cream Apply 1 application topically daily. 25 g 0  . spironolactone (ALDACTONE) 25 MG tablet Take 12.5 mg by mouth.     . torsemide (DEMADEX) 20 MG tablet Take 1 tablet (20 mg total) by mouth 2 (two) times daily. 60 tablet 0   No facility-administered medications prior to visit.     ROS: Review of Systems  Objective:  BP (!) 154/86 (BP Location: Left Arm, Patient Position: Sitting, Cuff Size: Normal)   Pulse 72   Temp 98 F (36.7 C) (Oral)   Ht 5' 6.75" (1.695 m)   Wt 156 lb (70.8 kg)   BMI 24.62 kg/m   BP Readings from Last 3 Encounters:  11/10/18 (!) 154/86  08/22/18 118/60  08/11/18 136/74    Wt  Readings from Last 3 Encounters:  11/10/18 156 lb (70.8 kg)  08/22/18 159 lb 3.2 oz (72.2 kg)  08/11/18 158 lb (71.7 kg)    Physical Exam  ingrown toenail R big toe  AK  L forearm   Procedure note:  Incision and Drainage of an ingrown toenail    Indication :  L big toe ingrown toenail     Risks including unsuccessful procedure , possible need for a repeat procedure due to pus accumulation, scar formation, and others as well as benefits were explained to the patient in detail. Written consent was obtained/signed.    The toe was cleaned. The nail corner was clipped  Tolerated well. Complications: None.   Wound instructions provided.   Procedure Note :     Procedure : Cryosurgery   Indication:   Actinic keratosis(es)   Risks including unsuccessful procedure , bleeding, infection, bruising, scar, a need for a repeat  procedure and others were explained to the patient in detail as well as the benefits. Informed consent was obtained verbally.   1  lesion(s)  on L forearm   was/were treated with liquid nitrogen on a Q-tip in a usual fasion . Band-Aid was applied and antibiotic ointment was given for a later use.   Tolerated well. Complications none.  Postprocedure instructions :     Keep the wounds clean. You can wash them with liquid soap and water. Pat dry with gauze or a Kleenex tissue  Before applying antibiotic ointment and a Band-Aid.   You need to report immediately  if  any signs of infection develop.      Lab Results  Component Value Date   WBC 7.2 08/22/2018   HGB 12.1 (L) 08/22/2018   HCT 36.3 (L) 08/22/2018   PLT 234 08/22/2018   GLUCOSE 86 08/22/2018   CHOL 210 (H) 12/28/2013   TRIG 59.0 12/28/2013   HDL 63.40 12/28/2013   LDLCALC 135 (H) 12/28/2013   ALT 10 02/18/2018   AST 15 02/18/2018   NA 140 08/22/2018   K 5.0 08/22/2018   CL 99 08/22/2018   CREATININE 1.05 08/22/2018   BUN 35 08/22/2018   CO2 26 08/22/2018   TSH 1.11 07/30/2017   PSA  0.84 12/28/2013   INR 1.6 (H) 10/17/2016    Dg Chest 2 View  Result Date: 06/13/2018 CLINICAL DATA:  Routine, no complains, HTN, x-smoker, Hx of PNA, afib, pacemaker, EXAM: CHEST - 2 VIEW COMPARISON:  03/25/2018 FINDINGS: The patient has a RIGHT-sided transvenous pacemaker with leads to the RIGHT atrium and RIGHT ventricle. The lungs are hyperinflated. Perihilar peribronchial thickening is present and stable. Pleural changes at the RIGHT lung base are chronic. There is no new consolidation. Multiple thoracic wedge compression fractures. IMPRESSION: 1. Bronchitic changes. 2.  No focal acute pulmonary abnormality. Electronically Signed   By: Nolon Nations M.D.   On: 06/13/2018 14:59    Assessment & Plan:   There are no diagnoses linked to this encounter.   No orders of the defined types were placed in this encounter.    Follow-up: No follow-ups on file.  Walker Kehr, MD

## 2018-11-10 NOTE — Patient Instructions (Signed)
   Postprocedure instructions :     Keep the wounds clean. You can wash them with liquid soap and water. Pat dry with gauze or a Kleenex tissue  Before applying antibiotic ointment and a Band-Aid.   You need to report immediately  if  any signs of infection develop.    

## 2018-11-11 ENCOUNTER — Encounter: Payer: Self-pay | Admitting: Internal Medicine

## 2018-11-14 ENCOUNTER — Other Ambulatory Visit: Payer: Self-pay | Admitting: Internal Medicine

## 2018-11-14 NOTE — Telephone Encounter (Signed)
Pt received in hospital, please advise

## 2018-12-01 ENCOUNTER — Ambulatory Visit (INDEPENDENT_AMBULATORY_CARE_PROVIDER_SITE_OTHER): Payer: Medicare HMO | Admitting: *Deleted

## 2018-12-01 DIAGNOSIS — I442 Atrioventricular block, complete: Secondary | ICD-10-CM | POA: Diagnosis not present

## 2018-12-01 DIAGNOSIS — I4821 Permanent atrial fibrillation: Secondary | ICD-10-CM

## 2018-12-02 LAB — CUP PACEART REMOTE DEVICE CHECK
Brady Statistic AP VP Percent: 98 %
Brady Statistic AP VS Percent: 1 %
Brady Statistic AS VP Percent: 1.9 %
Brady Statistic AS VS Percent: 1 %
Brady Statistic RV Percent Paced: 99 %
Implantable Lead Implant Date: 19980824
Implantable Lead Implant Date: 19980824
Implantable Lead Location: 753859
Implantable Lead Location: 753860
Lead Channel Impedance Value: 340 Ohm
Lead Channel Impedance Value: 380 Ohm
Lead Channel Sensing Intrinsic Amplitude: 12 mV
Lead Channel Setting Sensing Sensitivity: 6 mV
MDC IDC MSMT BATTERY REMAINING LONGEVITY: 79 mo
MDC IDC MSMT BATTERY REMAINING PERCENTAGE: 95.5 %
MDC IDC MSMT BATTERY VOLTAGE: 2.96 V
MDC IDC PG IMPLANT DT: 20180214
MDC IDC SESS DTM: 20200317130406
MDC IDC SET LEADCHNL RA PACING AMPLITUDE: 2 V
MDC IDC SET LEADCHNL RV PACING AMPLITUDE: 2.5 V
MDC IDC SET LEADCHNL RV PACING PULSEWIDTH: 0.8 ms
MDC IDC STAT BRADY RA PERCENT PACED: 98 %
Pulse Gen Model: 2272
Pulse Gen Serial Number: 7998385

## 2018-12-09 NOTE — Progress Notes (Signed)
Remote pacemaker transmission.   

## 2019-02-14 ENCOUNTER — Other Ambulatory Visit: Payer: Self-pay | Admitting: Internal Medicine

## 2019-02-16 ENCOUNTER — Ambulatory Visit: Payer: Medicare HMO | Admitting: Internal Medicine

## 2019-02-18 ENCOUNTER — Other Ambulatory Visit: Payer: Self-pay

## 2019-02-18 ENCOUNTER — Telehealth: Payer: Self-pay | Admitting: Internal Medicine

## 2019-02-18 ENCOUNTER — Ambulatory Visit (HOSPITAL_COMMUNITY)
Admission: RE | Admit: 2019-02-18 | Discharge: 2019-02-18 | Disposition: A | Discharge status: Home / Self Care | Payer: Medicare HMO | Source: Ambulatory Visit | Attending: Physician Assistant | Admitting: Physician Assistant

## 2019-02-18 ENCOUNTER — Telehealth: Payer: Self-pay | Admitting: Student

## 2019-02-18 DIAGNOSIS — I4819 Other persistent atrial fibrillation: Secondary | ICD-10-CM | POA: Diagnosis not present

## 2019-02-18 MED ORDER — TORSEMIDE 20 MG PO TABS: 40.0000 mg | ORAL_TABLET | Freq: Every day | ORAL | 3 refills | Status: DC

## 2019-02-18 NOTE — Telephone Encounter (Signed)
RX resent

## 2019-02-18 NOTE — Progress Notes (Signed)
Electrophysiology TeleHealth Note   Due to national recommendations of social distancing due to [place] 19, Audio telehealth visit is felt to be most appropriate for this patient at this time.  See consent below from today for patient consent regarding telehealth for the [facility]. Consent obtained verbally.   Date:  02/18/2019   ID:  Ricky [name], DOB 1923-06-24, MRN [id number]  Location: home  Provider location: [address] [place], [place] [address] Evaluation Performed: Follow up  PCP:  Plotnikov, Evie Lacks, MD  Primary Electrophysiologist: Dr [name]   CC: Follow up for atrial fibrillation.   History of Present Illness: Ricky [name] is a 85 y.o. male who presents via audio conferencing for a telehealth visit today. Patient has a history of ?permenant afib s/p AV node ablation after two afib ablations, PPM, HTN, and HFpEF. Patient was thought to be in permanent afib but has been in maintaining SR for over a year per device clinic. Device clinic received an alert that patient had been in persistent afib for the last 17 days with HR around 100 bpm. Patient reports that he feels no different and was surprised to hear he was in afib. He denies any heart racing, palpitations, SOB, CP, dizziness, or fluid retention. He remains very active by walking three times per day and doing anaerobic exercises twice daily. There were no specific triggers that the patient could identify.   Today, he denies symptoms of palpitations, chest pain, shortness of breath, orthopnea, PND, lower extremity edema, claudication, dizziness, presyncope, syncope, bleeding, or neurologic sequela. The patient is tolerating medications without difficulties and is otherwise without complaint today.   he denies symptoms of cough, fevers, chills, or new SOB worrisome for COVID 19.     he has a BMI of There is no height or weight on file to calculate BMI..  BP 134/76 Pulse 80 Provided by pt with  home BP machine.  Past Medical History:  Diagnosis Date  . ANXIETY 04/26/2008  . Atrial fibrillation ([place]) 10/31/2010   Coumadin therapy;  Echo 6/12: EF 50-55%, moderate MR, moderate TR, PASP 52, mild LAE  . BPH (benign prostatic hypertrophy)   . CAP (community acquired pneumonia) 12/2017  . CHF (congestive heart failure) ([facility])   . Essential hypertension, benign 10/03/2007  . HTN (hypertension) 06/07/2013  . HYPOTHYROIDISM 09/04/2007  . LYMPHADENOPATHY 04/06/2009  . Nocturia   . Pacemaker    Status post AV nodal ablation  . TOBACCO USE, QUIT 07/14/2009   Past Surgical History:  Procedure Laterality Date  . COLONOSCOPY    . EYE SURGERY  2000   eye muscle release  . EYE SURGERY     both cataracts  . HERNIA REPAIR    . INGUINAL HERNIA REPAIR Right 05/06/2013   Procedure: HERNIA REPAIR INGUINAL ADULT;  Surgeon: Haywood Lasso, MD;  Location: [facility];  Service: General;  Laterality: Right;  . PACEMAKER INSERTION  10/16/05  . PPM GENERATOR CHANGEOUT N/A 10/31/2016   Procedure: PPM Generator Changeout;  Surgeon: Deboraha Sprang, MD;  Location: [facility];  Service: Cardiovascular;  Laterality: N/A;     Current Outpatient Medications  Medication Sig Dispense Refill  . apixaban (ELIQUIS) 5 MG TABS tablet Take 1 tablet (5 mg total) by mouth 2 (two) times daily. 180 tablet 3  . Ascorbic Acid (VITAMIN C PO) Take 1 tablet by mouth daily.    . Carboxymethylcellul-Glycerin (LUBRICATING EYE DROPS OP) Apply 1 drop  to eye 5 (five) times daily.    . carvedilol (COREG) 12.5 MG tablet TAKE 1 TABLET BY MOUTH TWICE DAILY 180 tablet 1  . Cholecalciferol 1000 UNITS tablet Take 1,000 Units by mouth daily. Reported on 12/23/2015    . diphenhydrAMINE (BENADRYL) 25 mg capsule Take 1-2 capsules (25-50 mg total) by mouth every 4 (four) hours as needed for itching or allergies (1-2 for bee stings). 60 capsule 2  . EPINEPHrine (EPIPEN 2-PAK) 0.3 mg/0.3 mL DEVI Inject 0.3 mg into the  muscle daily as needed (allergic reaction). For allergic reaction    . irbesartan (AVAPRO) 150 MG tablet Take 1 tablet (150 mg total) by mouth daily. 90 tablet 3  . levothyroxine (SYNTHROID, LEVOTHROID) 75 MCG tablet Take 75 mcg by mouth daily.    . silver sulfADIAZINE (SILVADENE) 1 % cream Apply 1 application topically daily. 25 g 0  . spironolactone (ALDACTONE) 25 MG tablet Take 12.5 mg by mouth.     . torsemide (DEMADEX) 20 MG tablet Take 2 tablets by mouth once daily 36 tablet 11   No current facility-administered medications for this encounter.     Allergies:   Bee venom; Amiodarone hcl; Atorvastatin; Benazepril hcl; Clindamycin/lincomycin; Clonidine hydrochloride; Colesevelam; Diltiazem hcl; Ezetimibe; Levaquin [levofloxacin in d5w]; Verapamil; and Amoxicillin   Social History:  The patient  reports that he quit smoking about 39 years ago. His smoking use included cigarettes. He has a 30.00 pack-year smoking history. He has never used smokeless tobacco. He reports that he does not drink alcohol or use drugs.   Family History:  The patient's  family history includes Cancer in his mother; Heart disease in his father; Hypertension in his mother and another family member.    ROS:  Please see the history of present illness.   All other systems are personally reviewed and negative.   Recent Labs: 08/22/2018: BUN 35; Creatinine, Ser 1.05; Hemoglobin 12.1; Platelets 234; Potassium 5.0; Sodium 140  personally reviewed    Other studies personally reviewed: Additional studies/ records that were reviewed today include: Epic notes, echocardiogram   Echo 02/12/18 - Left ventricle: The cavity size was normal. Wall thickness was   increased in a pattern of mild LVH. Systolic function was normal.   The estimated ejection fraction was in the range of 60% to 65%.   Wall motion was normal; there were no regional wall motion   abnormalities. The study is not technically sufficient to allow    evaluation of LV diastolic function. - Aortic valve: Sclerosis without stenosis. There was no   regurgitation. - Mitral valve: Mildly thickened leaflets . There was mild   regurgitation. - Left atrium: The atrium was normal in size. - Right ventricle: The cavity size was mildly dilated. Wall   thickness was normal. Pacer wire or catheter noted in right   ventricle. Systolic function was normal. - Right atrium: Moderately dilated. Pacer wire or catheter noted in   right atrium. - Tricuspid valve: There was mild regurgitation. - Pulmonary arteries: PA peak pressure: 33 mm Hg (S). - Inferior vena cava: The vessel was normal in size. The   respirophasic diameter changes were in the normal range (>= 50%),   consistent with normal central venous pressure.  ASSESSMENT AND PLAN:  1. Persistent atrial fibrillation S/p AV node ablation w/ PPM but has been in SR for over a year. Patient in rate controlled, asymptomatic afib.  We discussed therapeutic options and given patient's advanced age and lack of symptoms, will proceed  conservatively.  Continue Coreg 12.5 mg BID. Could consider titration if more rate control required.  Continue Eliquis 5 mg BID Continue daily physical activity.   This patients CHA2DS2-VASc Score and unadjusted Ischemic Stroke Rate (% per year) is equal to 3.2 % stroke rate/year from a score of 3  Above score calculated as 1 point each if present [CHF, HTN, DM, Vascular=MI/PAD/Aortic Plaque, Age if 65-74, or Male] Above score calculated as 2 points each if present [Age > 75, or Stroke/TIA/TE]  2. S/p PPM Followed by device clinic and Dr [name]  3. Chronic diastolic CHF No symptoms of fluid overload. Continue present therapy.  4. HTN Stable, no changes today.  COVID screen The patient does not have any symptoms that suggest any further testing/ screening at this time.  Social distancing reinforced today.    Follow-up with Dr [name]. Remote device checks as  scheduled.   Current medicines are reviewed at length with the patient today.   The patient does not have concerns regarding his medicines.  The following changes were made today:  none  Labs/ tests ordered today include:  No orders of the defined types were placed in this encounter.   Patient Risk:  after full review of this patients clinical status, I feel that they are at moderate risk at this time.   Today, I have spent 18 minutes with the patient with telehealth technology discussing atrial fibrillation, lifestyle, and COVID-19 precautions.    Gwenlyn Perking PA-C 02/18/2019 2:34 PM  Afib [facility] [place], [place] [address] [phone]   I hereby voluntarily request, consent and authorize the [facility] and its employed or contracted physicians, physician assistants, nurse practitioners or other licensed health care professionals (the Practitioner), to provide me with telemedicine health care services (the "Services") as deemed necessary by the treating Practitioner. I acknowledge and consent to receive the Services by the Practitioner via telemedicine. I understand that the telemedicine visit will involve communicating with the Practitioner through live audiovisual communication technology and the disclosure of certain medical information by electronic transmission. I acknowledge that I have been given the opportunity to request an in-person assessment or other available alternative prior to the telemedicine visit and am voluntarily participating in the telemedicine visit.   I understand that I have the right to withhold or withdraw my consent to the use of telemedicine in the course of my care at any time, without affecting my right to future care or treatment, and that the Practitioner or I may terminate the telemedicine visit at any time. I understand that I have the right to inspect all information obtained and/or recorded in  the course of the telemedicine visit and may receive copies of available information for a reasonable fee.  I understand that some of the potential risks of receiving the Services via telemedicine include:   Delay or interruption in medical evaluation due to technological equipment failure or disruption;  Information transmitted may not be sufficient (e.g. poor resolution of images) to allow for appropriate medical decision making by the Practitioner; and/or  In rare instances, security protocols could fail, causing a breach of personal health information.   Furthermore, I acknowledge that it is my responsibility to provide information about my medical history, conditions and care that is complete and accurate to the best of my ability. I acknowledge that Practitioner's advice, recommendations, and/or decision may be based on factors not within their control, such as incomplete or inaccurate data provided  by me or distortions of diagnostic images or specimens that may result from electronic transmissions. I understand that the practice of medicine is not an exact science and that Practitioner makes no warranties or guarantees regarding treatment outcomes. I acknowledge that I will receive a copy of this consent concurrently upon execution via email to the email address I last provided but may also request a printed copy by calling the office of the [facility].  I understand that my insurance will be billed for this visit.   I have read or had this consent read to me.  I understand the contents of this consent, which adequately explains the benefits and risks of the Services being provided via telemedicine.  I have been provided ample opportunity to ask questions regarding this consent and the Services and have had my questions answered to my satisfaction.  I give my informed consent for the services to be provided through the use of telemedicine in my medical care  By participating in  this telemedicine visit I agree to the above.

## 2019-02-18 NOTE — Telephone Encounter (Signed)
appt made for this afternoon with ricky fenton, pa.

## 2019-02-18 NOTE — Telephone Encounter (Signed)
Copied from [place] [id number]. Topic: Quick Communication - Rx Refill/Question >> Feb 18, 2019  2:23 PM Gustavus Messing wrote: Medication: torsemide (DEMADEX) 20 MG tablet   Has the patient contacted their pharmacy? Yes.   (Agent: If yes, when and what did the pharmacy advise?) The pharmacy only gave the patient 18 pills instead of the 90 pills he is supposed to get. Someone at the practice said that he had 11 refills and it did not look right. Please call the patient back.  Preferred Pharmacy (with phone number or street name): [facility] ([address] [place] [phone] (Phone) [phone] (Fax)    Agent: Please be advised that RX refills may take up to 3 business days. We ask that you follow-up with your pharmacy.

## 2019-02-18 NOTE — Telephone Encounter (Signed)
  Received [facility] alert that pt had been in AF for past 17 days, and presenting as such. V rates ~ 100 bpm. Previously thought to be "permanent Afib" but last episode of AF is > 12 months ago. Pt denies symptoms including SOB, worsening edema, lightheadedness or dizziness. He has not missed any doses of his Eliquis. Pt agrees to scheduling visit with Afib clinic.    Legrand Como [address] [place], PA-C 02/18/2019 8:35 AM

## 2019-02-18 NOTE — Telephone Encounter (Signed)
Thanks

## 2019-03-02 ENCOUNTER — Ambulatory Visit (INDEPENDENT_AMBULATORY_CARE_PROVIDER_SITE_OTHER): Payer: Medicare HMO | Admitting: *Deleted

## 2019-03-02 DIAGNOSIS — I442 Atrioventricular block, complete: Secondary | ICD-10-CM

## 2019-03-02 LAB — CUP PACEART REMOTE DEVICE CHECK
Battery Remaining Longevity: 79 mo
Battery Remaining Percentage: 95.5 %
Battery Voltage: 2.96 V
Brady Statistic AP VP Percent: 98 %
Brady Statistic AP VS Percent: 1 %
Brady Statistic AS VP Percent: 2.3 %
Brady Statistic AS VS Percent: 1 %
Brady Statistic RA Percent Paced: 83 %
Brady Statistic RV Percent Paced: 99 %
Date Time Interrogation Session: 20200615133934
Implantable Lead Implant Date: 19980824
Implantable Lead Implant Date: 19980824
Implantable Lead Location: 753859
Implantable Lead Location: 753860
Implantable Pulse Generator Implant Date: 20180214
Lead Channel Impedance Value: 330 Ohm
Lead Channel Impedance Value: 400 Ohm
Lead Channel Pacing Threshold Amplitude: 1 V
Lead Channel Pacing Threshold Amplitude: 1 V
Lead Channel Pacing Threshold Pulse Width: 0.4 ms
Lead Channel Pacing Threshold Pulse Width: 0.8 ms
Lead Channel Sensing Intrinsic Amplitude: 12 mV
Lead Channel Sensing Intrinsic Amplitude: 3.5 mV
Lead Channel Setting Pacing Amplitude: 2 V
Lead Channel Setting Pacing Amplitude: 2.5 V
Lead Channel Setting Pacing Pulse Width: 0.8 ms
Lead Channel Setting Sensing Sensitivity: 6 mV
Pulse Gen Model: 2272
Pulse Gen Serial Number: 7998385

## 2019-03-05 ENCOUNTER — Telehealth: Payer: Self-pay

## 2019-03-05 NOTE — Telephone Encounter (Signed)
M  2 questions plz 1)  How is he feeling in Afib,  In the past he has felt acutely lousy and if that is the case, we should think about DCCV  2)  If he feels ok, your point is great, and worth turning his HR down.  Mostly when this happens, though, and you may be able to find the notes in paceart, is that we increased it to this unusual rate for some ( maybe not great) reason and he was better  Let me know  Thanks

## 2019-03-05 NOTE — Telephone Encounter (Signed)
Pt back in persistent AF, will route to Dr. [name] for review of programming.

## 2019-03-05 NOTE — Telephone Encounter (Signed)
Pt seen in AF clinic on 02/18/19, reported feeling no different per AF clinic note. Spoke to pt today, continues to deny symptoms. Reviewed OV notes and Paceart notes, appears when device was reprogrammed from VVIR to DDDR in 02/03/18, AMS base rate automatically programmed to 80 bpm. Base rate when programmed VVIR for presumed permanent AF was 65 bpm.

## 2019-03-08 NOTE — Telephone Encounter (Signed)
M  Lets change the base rate to 70   We should find out how we ended up with a number of 80- its hard to believe  That that is the default   Thanks  sk

## 2019-03-09 ENCOUNTER — Encounter: Payer: Self-pay | Admitting: Cardiology

## 2019-03-09 ENCOUNTER — Ambulatory Visit (INDEPENDENT_AMBULATORY_CARE_PROVIDER_SITE_OTHER): Payer: Medicare HMO | Admitting: *Deleted

## 2019-03-09 ENCOUNTER — Other Ambulatory Visit: Payer: Self-pay

## 2019-03-09 DIAGNOSIS — I442 Atrioventricular block, complete: Secondary | ICD-10-CM

## 2019-03-09 LAB — CUP PACEART INCLINIC DEVICE CHECK
Battery Remaining Longevity: 74 mo
Battery Voltage: 2.96 V
Brady Statistic RA Percent Paced: 80 %
Brady Statistic RV Percent Paced: 99.89 %
Date Time Interrogation Session: 20200622113810
Implantable Lead Implant Date: 19980824
Implantable Lead Implant Date: 19980824
Implantable Lead Location: 753859
Implantable Lead Location: 753860
Implantable Pulse Generator Implant Date: 20180214
Lead Channel Impedance Value: 337.5 Ohm
Lead Channel Impedance Value: 387.5 Ohm
Lead Channel Pacing Threshold Amplitude: 0.75 V
Lead Channel Pacing Threshold Pulse Width: 0.8 ms
Lead Channel Sensing Intrinsic Amplitude: 3.1 mV
Lead Channel Setting Pacing Amplitude: 2 V
Lead Channel Setting Pacing Amplitude: 2.5 V
Lead Channel Setting Pacing Pulse Width: 0.8 ms
Lead Channel Setting Sensing Sensitivity: 6 mV
Pulse Gen Model: 2272
Pulse Gen Serial Number: 7998385

## 2019-03-09 NOTE — Telephone Encounter (Signed)
Scheduled pt for appt to change base rate setting per Dr. [name].

## 2019-03-09 NOTE — Progress Notes (Signed)
Pacemaker check in clinic. Normal device function. Thresholds, sensing, impedances consistent with previous measurements. Device programmed to maximize longevity. 2 AMS alerts with AT/AF burden of 19%persistant since the end of May/ 2020. AMS base rate programmed at 80 bpm, rate decreased to 70 bpm per SK.  No high ventricular rates noted. Pt does not report CP,SOB or edema. Device programmed at appropriate safety margins. Histogram distribution appropriate for patient activity level. Device programmed to optimize intrinsic conduction. Estimated longevity 6 yrs 2 mo. Patient enrolled in remote follow-up on 06/01/19. Patient education completed.

## 2019-03-09 NOTE — Progress Notes (Signed)
Remote pacemaker transmission.   

## 2019-03-13 ENCOUNTER — Other Ambulatory Visit: Payer: Self-pay | Admitting: Physician Assistant

## 2019-03-19 ENCOUNTER — Telehealth: Payer: Self-pay

## 2019-03-19 NOTE — Telephone Encounter (Signed)
I called patient about upcoming appointment on 03/24/19. Does patient want to keep it a virtual visit or come into the office?

## 2019-03-23 ENCOUNTER — Telehealth: Payer: Self-pay | Admitting: Internal Medicine

## 2019-03-23 NOTE — Telephone Encounter (Signed)

## 2019-03-24 ENCOUNTER — Telehealth (INDEPENDENT_AMBULATORY_CARE_PROVIDER_SITE_OTHER): Payer: Medicare HMO | Admitting: Internal Medicine

## 2019-03-24 ENCOUNTER — Other Ambulatory Visit: Payer: Self-pay

## 2019-03-24 ENCOUNTER — Encounter: Payer: Self-pay | Admitting: Internal Medicine

## 2019-03-24 VITALS — BP 132/80 | HR 70 | Ht 67.0 in | Wt 160.0 lb

## 2019-03-24 DIAGNOSIS — I5032 Chronic diastolic (congestive) heart failure: Secondary | ICD-10-CM

## 2019-03-24 DIAGNOSIS — I4819 Other persistent atrial fibrillation: Secondary | ICD-10-CM

## 2019-03-24 DIAGNOSIS — Z950 Presence of cardiac pacemaker: Secondary | ICD-10-CM

## 2019-03-24 DIAGNOSIS — I442 Atrioventricular block, complete: Secondary | ICD-10-CM | POA: Diagnosis not present

## 2019-03-24 NOTE — Progress Notes (Signed)
Electrophysiology TeleHealth Note   Due to national recommendations of social distancing due to COVID 19, an audio/video telehealth visit is felt to be most appropriate for this patient at this time.  See MyChart message from today for the patient's consent to telehealth for [name].   Date:  03/24/2019   ID:  Ricky [name], DOB 17-Sep-1923, MRN [id number]  Location: patient's home  Provider location: [address], [place]  Evaluation Performed: Follow-up visit  PCP:  Plotnikov, Evie Lacks, MD  Cardiologist:     Electrophysiologist:  SK   Chief Complaint:  Atrial fib and complete heart block   History of Present Illness:    Ricky [name] is a 85 y.o. male who presents via audio/video conferencing for a telehealth visit today. The patient did not have access to video technology/had technical difficulties with video requiring transitioning to audio format only (telephone).  All issues noted in this document were discussed and addressed.  No physical exam could be performed with this format.      Since last being seen in our clinic, the patient reports doing really quite well.  He was notified by our office 6/20 that he had reverted to atrial fibrillation.  He was unaware of it.  Retrospectively, he hadnoted some changes in exercise tolerance but otherwise is largely without change.  No bleeding  DATE TEST EF   2/14 Echo   60 % LAE   5/19 Echo    65 % (LAE 35/1.9/20)         Date Cr K Hgb  5/19 0.88    13.7  7/19     11.8  11/19 1.26 4.8    12/19 1.05 5.0 12.1    The patient denies symptoms of fevers, chills, cough, or new SOB worrisome for COVID 19.    Past Medical History:  Diagnosis Date  . ANXIETY 04/26/2008  . Atrial fibrillation ([place]) 10/31/2010   Coumadin therapy;  Echo 6/12: EF 50-55%, moderate MR, moderate TR, PASP 52, mild LAE  . BPH (benign prostatic hypertrophy)   . CAP (community acquired pneumonia) 12/2017  . CHF (congestive  heart failure) ([facility])   . Essential hypertension, benign 10/03/2007  . HTN (hypertension) 06/07/2013  . HYPOTHYROIDISM 09/04/2007  . LYMPHADENOPATHY 04/06/2009  . Nocturia   . Pacemaker    Status post AV nodal ablation  . TOBACCO USE, QUIT 07/14/2009    Past Surgical History:  Procedure Laterality Date  . COLONOSCOPY    . EYE SURGERY  2000   eye muscle release  . EYE SURGERY     both cataracts  . HERNIA REPAIR    . INGUINAL HERNIA REPAIR Right 05/06/2013   Procedure: HERNIA REPAIR INGUINAL ADULT;  Surgeon: Haywood Lasso, MD;  Location: [facility];  Service: General;  Laterality: Right;  . PACEMAKER INSERTION  10/16/05  . PPM GENERATOR CHANGEOUT N/A 10/31/2016   Procedure: PPM Generator Changeout;  Surgeon: Deboraha Sprang, MD;  Location: [facility];  Service: Cardiovascular;  Laterality: N/A;    Current Outpatient Medications  Medication Sig Dispense Refill  . apixaban (ELIQUIS) 5 MG TABS tablet Take 1 tablet (5 mg total) by mouth 2 (two) times daily. 180 tablet 3  . Ascorbic Acid (VITAMIN C PO) Take 1 tablet by mouth daily.    . Carboxymethylcellul-Glycerin (LUBRICATING EYE DROPS OP) Apply 1 drop to eye 5 (five) times daily.    . carvedilol (COREG) 12.5 MG tablet  TAKE 1 TABLET BY MOUTH TWICE DAILY 180 tablet 1  . Cholecalciferol 1000 UNITS tablet Take 1,000 Units by mouth daily. Reported on 12/23/2015    . diphenhydrAMINE (BENADRYL) 25 mg capsule Take 1-2 capsules (25-50 mg total) by mouth every 4 (four) hours as needed for itching or allergies (1-2 for bee stings). 60 capsule 2  . EPINEPHrine (EPIPEN 2-PAK) 0.3 mg/0.3 mL DEVI Inject 0.3 mg into the muscle daily as needed (allergic reaction). For allergic reaction    . irbesartan (AVAPRO) 150 MG tablet Take 1 tablet (150 mg total) by mouth daily. 90 tablet 3  . levothyroxine (SYNTHROID, LEVOTHROID) 75 MCG tablet Take 75 mcg by mouth daily.    Marland Kitchen spironolactone (ALDACTONE) 25 MG tablet Take 1/2 (one-half) tablet  by mouth once daily 45 tablet 3  . torsemide (DEMADEX) 20 MG tablet Take 2 tablets (40 mg total) by mouth daily. 180 tablet 3   No current facility-administered medications for this visit.     Allergies:   Bee venom, Amiodarone hcl, Atorvastatin, Benazepril hcl, Clindamycin/lincomycin, Clonidine hydrochloride, Colesevelam, Diltiazem hcl, Ezetimibe, Levaquin [levofloxacin in d5w], Verapamil, and Amoxicillin   Social History:  The patient  reports that he quit smoking about 39 years ago. His smoking use included cigarettes. He has a 30.00 pack-year smoking history. He has never used smokeless tobacco. He reports that he does not drink alcohol or use drugs.   Family History:  The patient's   family history includes Cancer in his mother; Heart disease in his father; Hypertension in his mother and another family member.   ROS:  Please see the history of present illness.   All other systems are personally reviewed and negative.    Exam:    Vital Signs:  BP 132/80   Pulse 70   Ht 5\' 7"  (1.702 m)   Wt 160 lb (72.6 kg)   BMI 25.06 kg/m        Labs/Other Tests and Data Reviewed:    Recent Labs: 08/22/2018: BUN 35; Creatinine, Ser 1.05; Hemoglobin 12.1; Platelets 234; Potassium 5.0; Sodium 140   Wt Readings from Last 3 Encounters:  03/24/19 160 lb (72.6 kg)  11/10/18 156 lb (70.8 kg)  11/10/18 156 lb (70.8 kg)     Other studies personally reviewed:   Last device remote is reviewed from [facility] PDF dated  6/20 which reveals normal device function,   arrhythmias - AFib since 01/30/19   ASSESSMENT & PLAN:    Atrial fibrillation-thought to be permanent but now with evidence of sinus rhythm  HFpEF  Complete heart block s/p AV ablation  Pacemaker-[employer]   Hypertension well controlled   COVID 19 screen The patient denies symptoms of COVID 19 at this time.  The importance of social distancing was discussed today.  Follow-up:  2 m telehealth visit   Next remote: As  Scheduled   Current medicines are reviewed at length with the patient today.   The patient  concerns regarding his medicines.  The following changes were made today:    Labs/ tests ordered today include:  No orders of the defined types were placed in this encounter.   Future tests ( post COVID )   in   months  Patient Risk:  after full review of this patients clinical status, I feel that they are at moderate risk at this time.  Today, I have spent 8 minutes with the patient with telehealth technology discussing the above.  Signed, Virl Axe, MD  03/24/2019 2:56 PM  [place] [address] [phone] (office) [phone] (fax)

## 2019-03-24 NOTE — Telephone Encounter (Signed)
Patient would like to keep appointment as a virtual visit today with Dr. [name].

## 2019-03-24 NOTE — Patient Instructions (Signed)
Pt agreed with a 2 months follow up. He had no additional questions.

## 2019-04-21 IMAGING — DX DG CHEST 2V
2 series · 2 of 2 positions shown · non-contrast
Comparison: 09/06/2015, 10/22/2012 and earlier.

CLINICAL DATA: [AGE] with 5 day history of cough and
shortness of breath. Former smoker.

EXAM:
CHEST - 2 VIEW

[x chest ap]
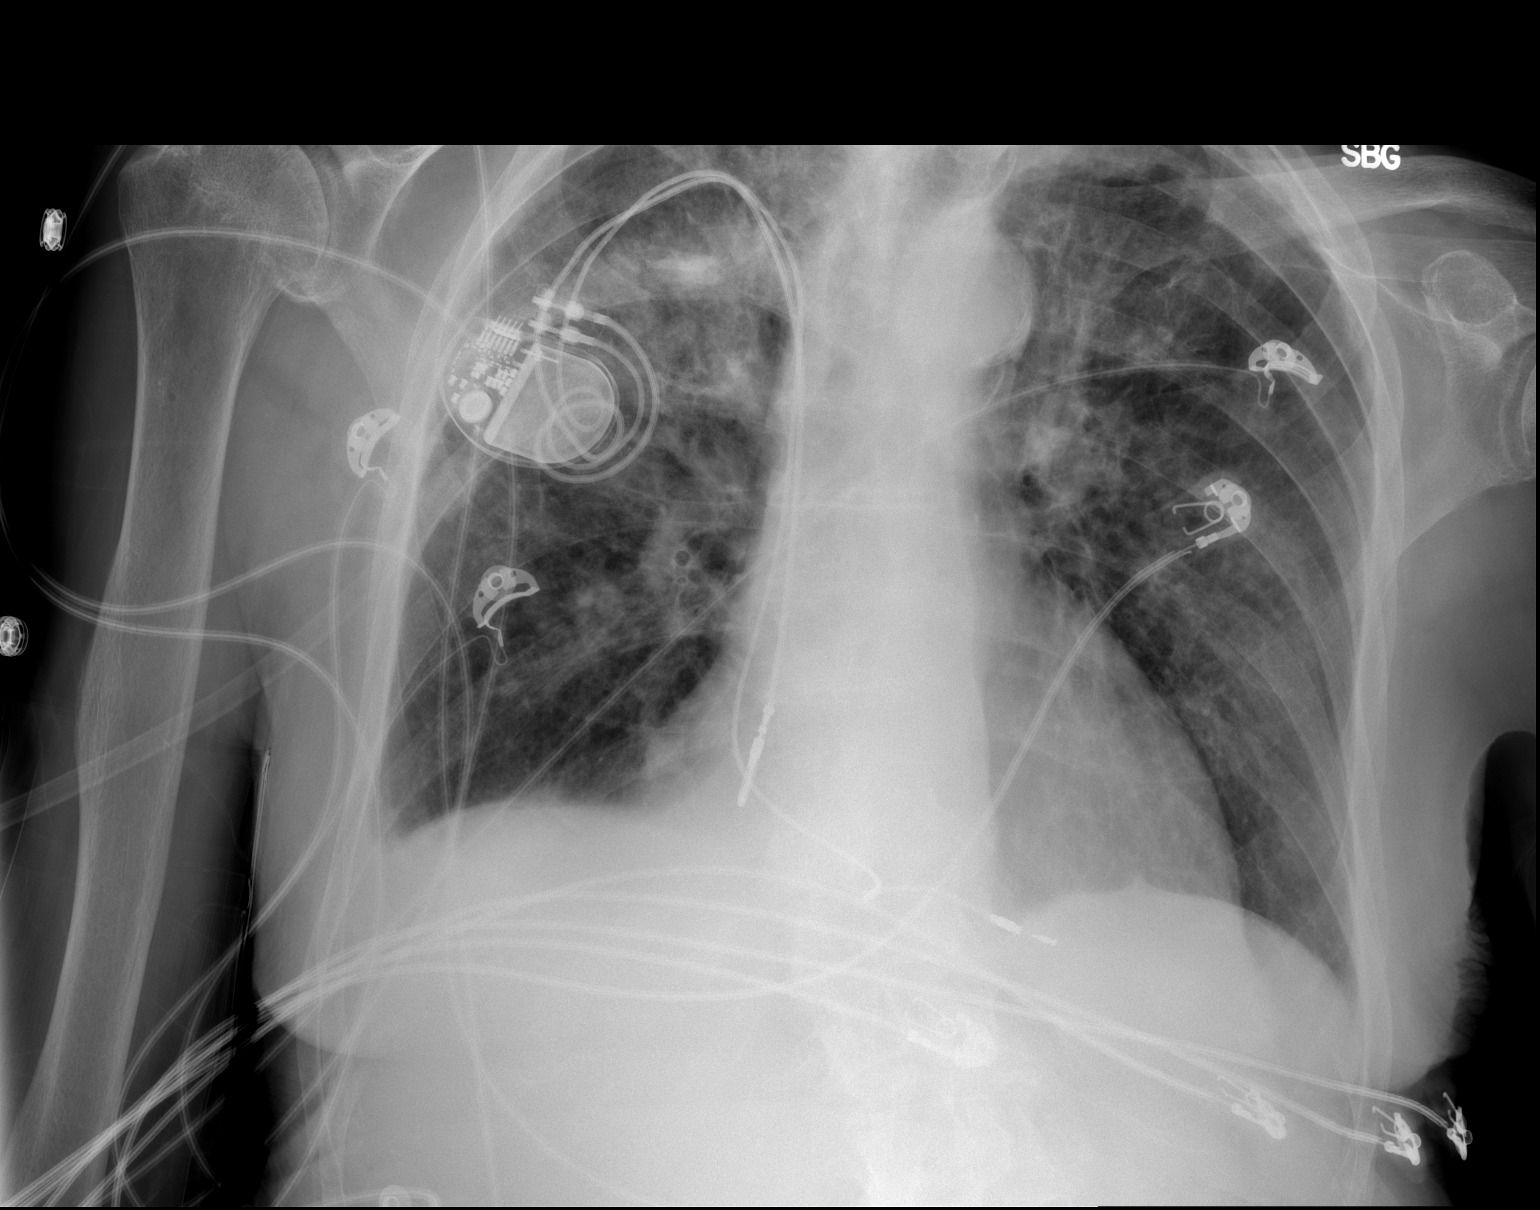

[w chest lat]
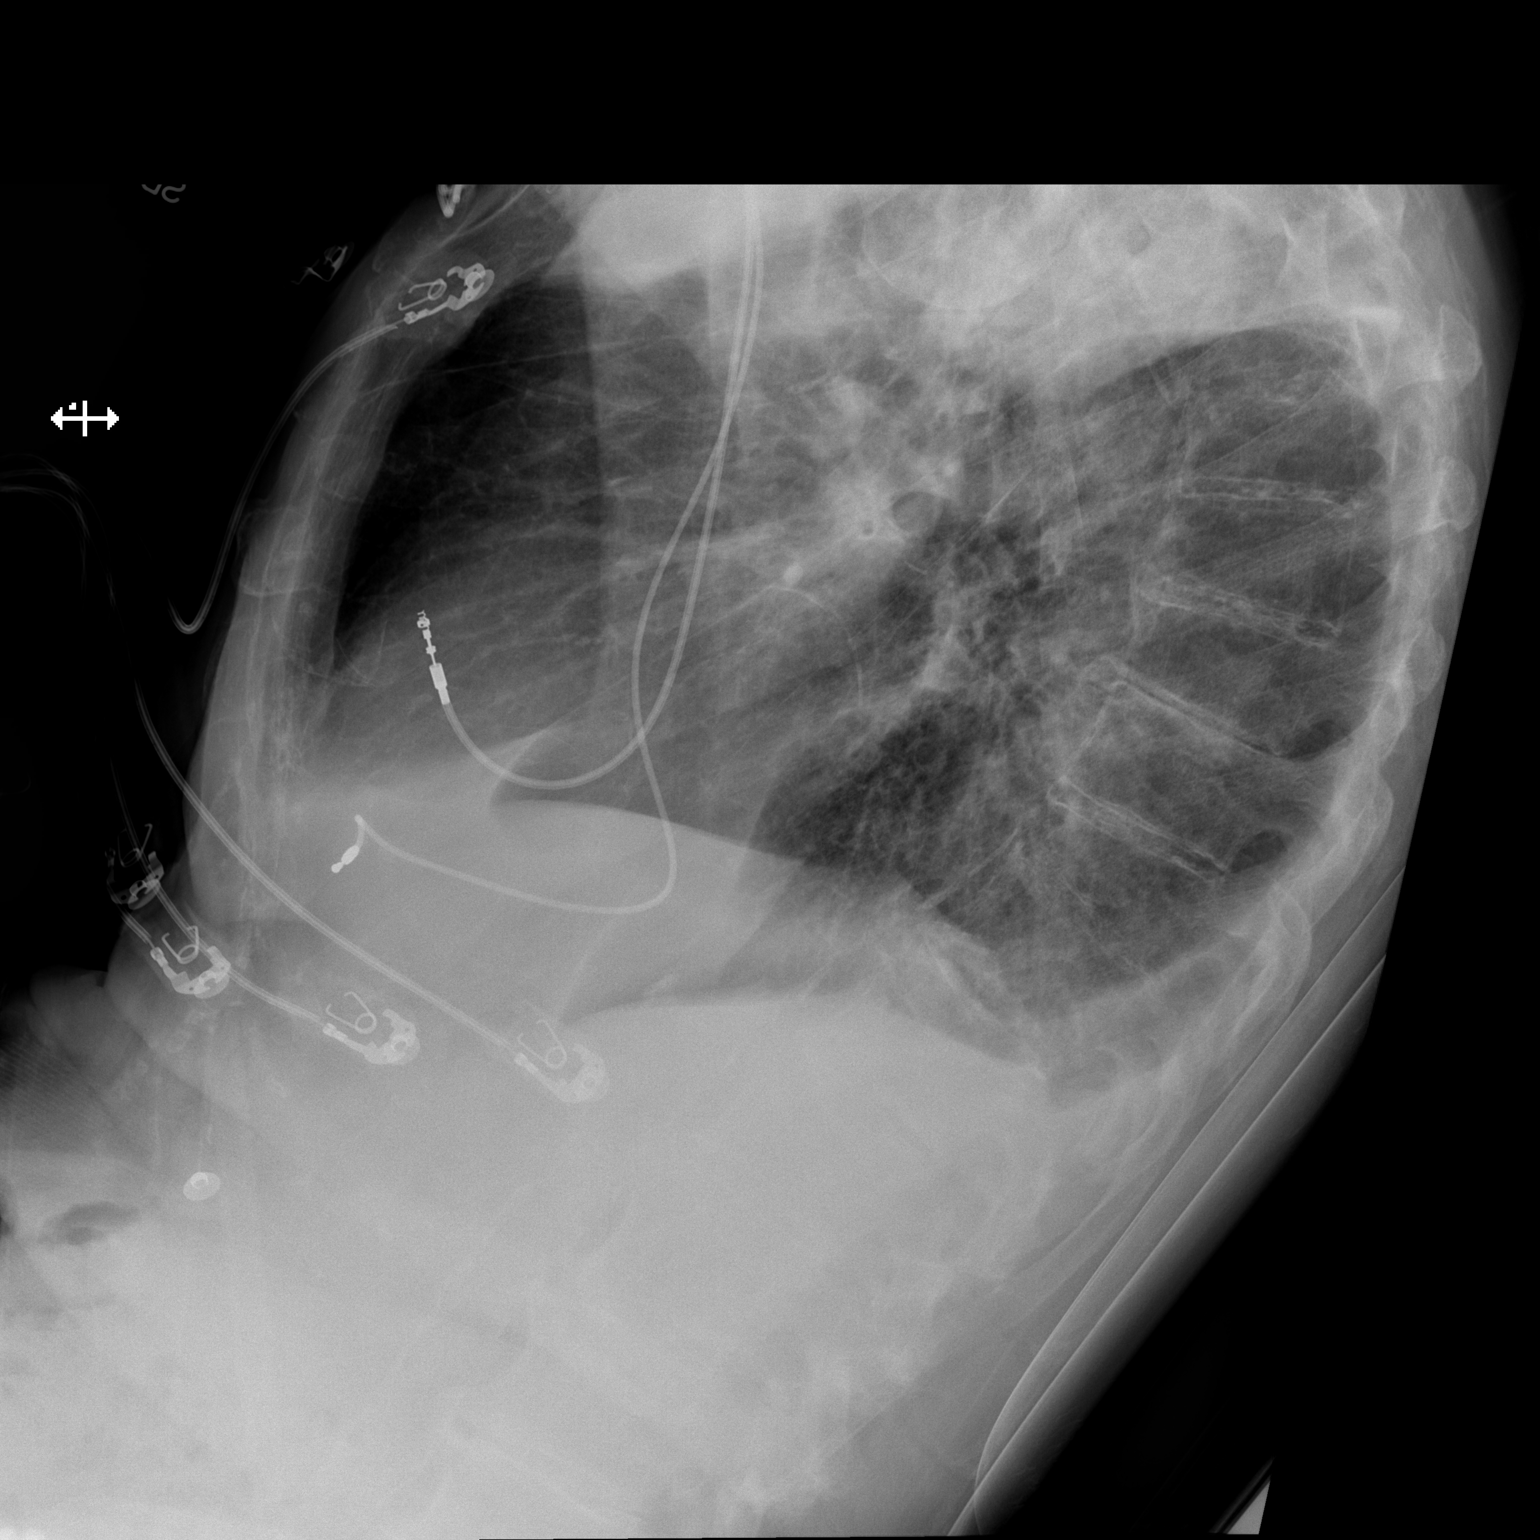

[2 of 2 positions shown; findings below may reference images not displayed]

FINDINGS: AP ERECT and LATERAL images were obtained. Cardiac silhouette mildly
enlarged, unchanged. RIGHT subclavian dual lead transvenous
pacemaker unchanged and appears intact. Thoracic aorta mildly
atherosclerotic, unchanged. Emphysematous changes throughout both
lungs, unchanged. BILATERAL pleural effusions, unchanged. Airspace
consolidation involving the RIGHT upper lobe. Lungs otherwise clear.
Pulmonary vascularity normal. Degenerative changes involving the
thoracic and UPPER lumbar spine with exaggeration of the usual
thoracic kyphosis.
IMPRESSION: 1. Acute RIGHT upper lobe pneumonia.
2. Stable mild cardiomegaly without evidence of pulmonary edema.
3. Stable chronic BILATERAL pleural effusions.

## 2019-04-24 IMAGING — DX DG CHEST 1V PORT
1 series · 1 of 1 positions shown · non-contrast
Comparison: PA and lateral chest x-ray December 24, 2016

CLINICAL DATA: Shortness of breath. History of atrial fibrillation,
community-acquired pneumonia, CHF, former smoker.

EXAM:
PORTABLE CHEST 1 VIEW

[chest ap]
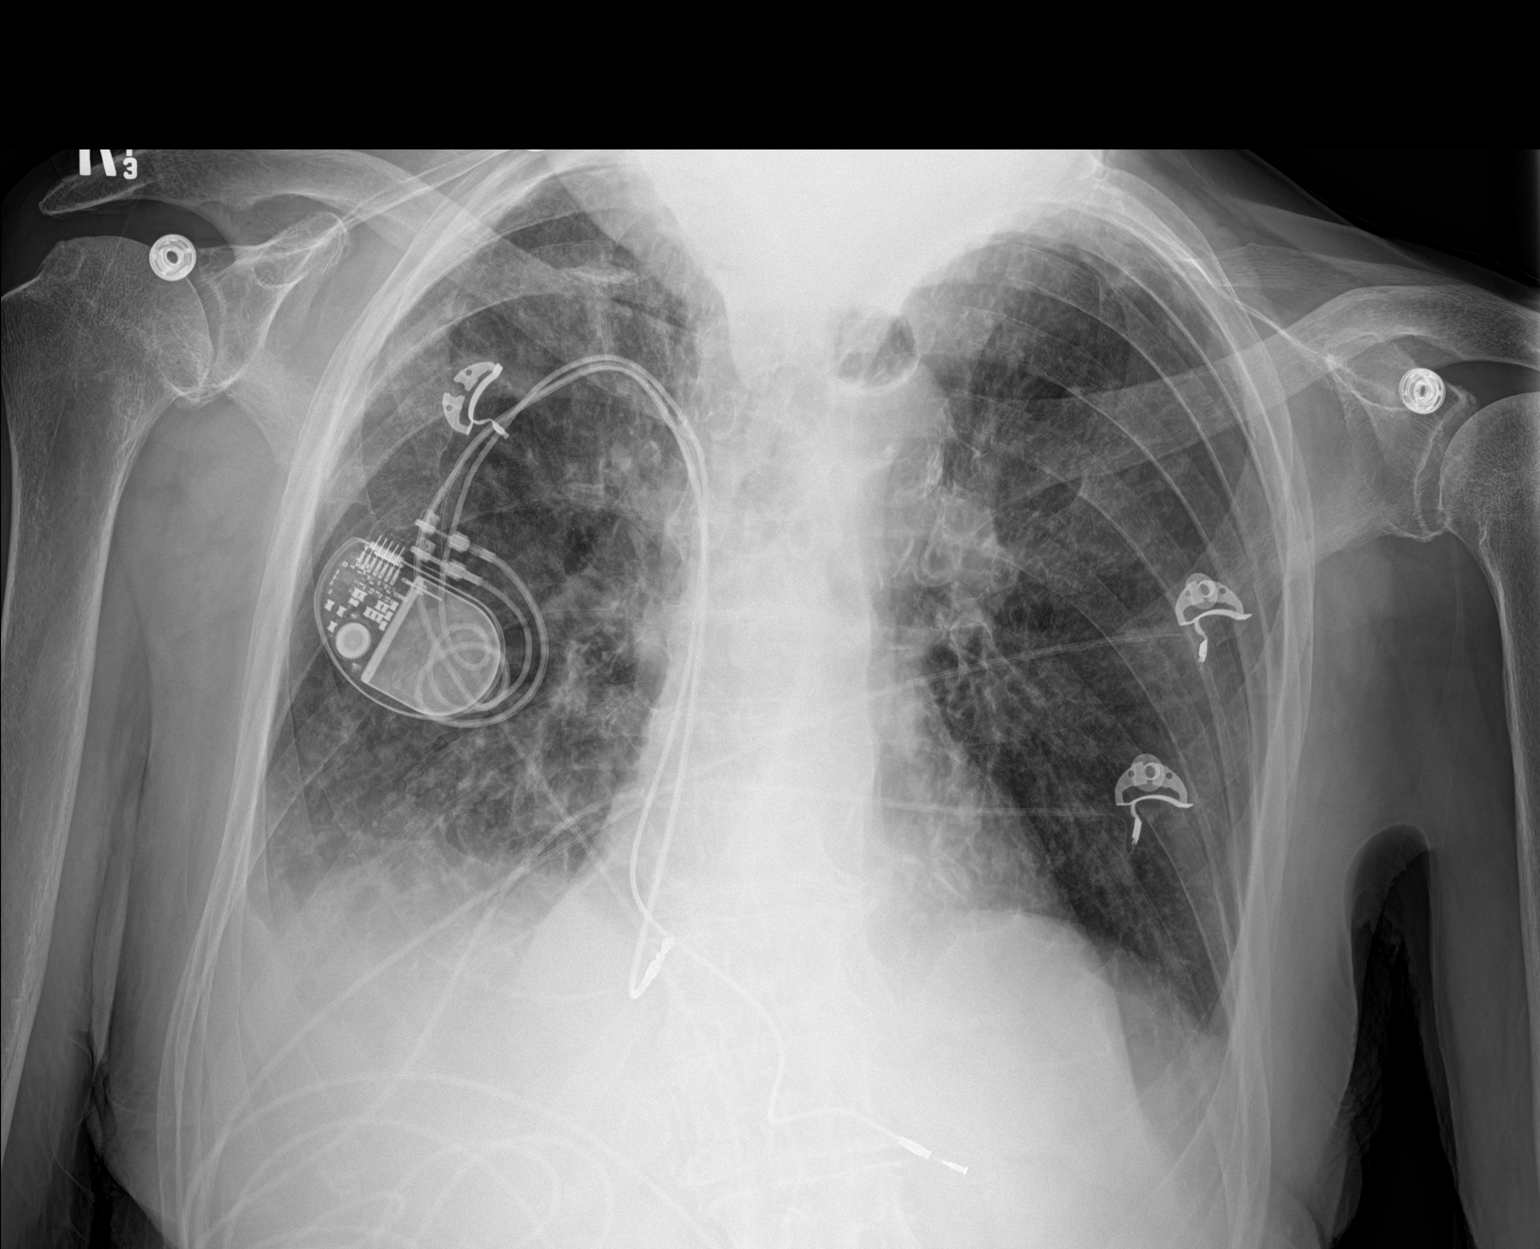

[1 of 1 positions shown; findings below may reference images not displayed]

FINDINGS: The lungs are reasonably well inflated. There is a small right
pleural effusion and possible smaller left pleural effusion. The
interstitial markings remain increased diffusely on the right and to
a lesser extent on the left. The heart is normal in size. There is
calcification in the wall of the thoracic aorta. The ICD is in
stable position.
IMPRESSION: Increased interstitial markings bilaterally greatest on the right
worrisome for pneumonia. Asymmetric pulmonary edema is felt less
likely. Small right pleural effusion. Followup PA and lateral chest
X-ray is recommended in 3-4 weeks following trial of antibiotic
therapy to ensure resolution and exclude underlying malignancy.

## 2019-05-19 ENCOUNTER — Encounter: Payer: Self-pay | Admitting: Internal Medicine

## 2019-05-19 ENCOUNTER — Other Ambulatory Visit (INDEPENDENT_AMBULATORY_CARE_PROVIDER_SITE_OTHER): Payer: Medicare HMO

## 2019-05-19 ENCOUNTER — Other Ambulatory Visit: Payer: Self-pay

## 2019-05-19 ENCOUNTER — Ambulatory Visit (INDEPENDENT_AMBULATORY_CARE_PROVIDER_SITE_OTHER): Payer: Medicare HMO | Admitting: Internal Medicine

## 2019-05-19 VITALS — BP 128/62 | HR 70 | Temp 97.5°F | Ht 67.0 in | Wt 159.0 lb

## 2019-05-19 DIAGNOSIS — I5032 Chronic diastolic (congestive) heart failure: Secondary | ICD-10-CM

## 2019-05-19 DIAGNOSIS — Z23 Encounter for immunization: Secondary | ICD-10-CM

## 2019-05-19 DIAGNOSIS — E039 Hypothyroidism, unspecified: Secondary | ICD-10-CM

## 2019-05-19 DIAGNOSIS — R270 Ataxia, unspecified: Secondary | ICD-10-CM

## 2019-05-19 DIAGNOSIS — I10 Essential (primary) hypertension: Secondary | ICD-10-CM

## 2019-05-19 LAB — URINALYSIS
Bilirubin Urine: NEGATIVE
Hgb urine dipstick: NEGATIVE
Ketones, ur: NEGATIVE
Leukocytes,Ua: NEGATIVE
Nitrite: NEGATIVE
Specific Gravity, Urine: 1.01 (ref 1.000–1.030)
Total Protein, Urine: NEGATIVE
Urine Glucose: NEGATIVE
Urobilinogen, UA: 0.2 (ref 0.0–1.0)
pH: 6.5 (ref 5.0–8.0)

## 2019-05-19 LAB — CBC WITH DIFFERENTIAL/PLATELET
Basophils Absolute: 0 10*3/uL (ref 0.0–0.1)
Basophils Relative: 0.4 % (ref 0.0–3.0)
Eosinophils Absolute: 0.1 10*3/uL (ref 0.0–0.7)
Eosinophils Relative: 1.9 % (ref 0.0–5.0)
HCT: 38.8 % — ABNORMAL LOW (ref 39.0–52.0)
Hemoglobin: 13 g/dL (ref 13.0–17.0)
Lymphocytes Relative: 27.8 % (ref 12.0–46.0)
Lymphs Abs: 1.6 10*3/uL (ref 0.7–4.0)
MCHC: 33.5 g/dL (ref 30.0–36.0)
MCV: 101.6 fl — ABNORMAL HIGH (ref 78.0–100.0)
Monocytes Absolute: 0.6 10*3/uL (ref 0.1–1.0)
Monocytes Relative: 10.1 % (ref 3.0–12.0)
Neutro Abs: 3.4 10*3/uL (ref 1.4–7.7)
Neutrophils Relative %: 59.8 % (ref 43.0–77.0)
Platelets: 214 10*3/uL (ref 150.0–400.0)
RBC: 3.82 Mil/uL — ABNORMAL LOW (ref 4.22–5.81)
RDW: 13.3 % (ref 11.5–15.5)
WBC: 5.8 10*3/uL (ref 4.0–10.5)

## 2019-05-19 LAB — HEPATIC FUNCTION PANEL
ALT: 16 U/L (ref 0–53)
AST: 21 U/L (ref 0–37)
Albumin: 3.9 g/dL (ref 3.5–5.2)
Alkaline Phosphatase: 73 U/L (ref 39–117)
Bilirubin, Direct: 0.1 mg/dL (ref 0.0–0.3)
Total Bilirubin: 0.5 mg/dL (ref 0.2–1.2)
Total Protein: 6.8 g/dL (ref 6.0–8.3)

## 2019-05-19 LAB — BASIC METABOLIC PANEL
BUN: 35 mg/dL — ABNORMAL HIGH (ref 6–23)
CO2: 32 mEq/L (ref 19–32)
Calcium: 9.2 mg/dL (ref 8.4–10.5)
Chloride: 101 mEq/L (ref 96–112)
Creatinine, Ser: 1.21 mg/dL (ref 0.40–1.50)
GFR: 55.86 mL/min — ABNORMAL LOW (ref >60.00)
Glucose, Bld: 63 mg/dL — ABNORMAL LOW (ref 70–99)
Potassium: 4.7 mEq/L (ref 3.5–5.1)
Sodium: 139 mEq/L (ref 135–145)

## 2019-05-19 LAB — LIPID PANEL
Cholesterol: 198 mg/dL (ref 0–200)
HDL: 55.4 mg/dL (ref >39.00)
LDL Cholesterol: 122 mg/dL — ABNORMAL HIGH (ref 0–99)
NonHDL: 142.58
Total CHOL/HDL Ratio: 4
Triglycerides: 104 mg/dL (ref 0.0–149.0)
VLDL: 20.8 mg/dL (ref 0.0–40.0)

## 2019-05-19 LAB — TSH: TSH: 1.48 u[IU]/mL (ref 0.35–4.50)

## 2019-05-19 NOTE — Assessment & Plan Note (Signed)
Walker

## 2019-05-19 NOTE — Assessment & Plan Note (Signed)
Synthroid 

## 2019-05-19 NOTE — Patient Instructions (Signed)
If you have medicare related insurance (such as traditional Medicare, Blue Cross Medicare, United HealthCare Medicare, or similar), Please make an appointment at the scheduling desk with Jill, the Wellness Health Coach, for your Wellness visit in this office, which is a benefit with your insurance.  

## 2019-05-19 NOTE — Assessment & Plan Note (Signed)
Coreg, Demadex 40 mg/d, Losartan, Aldactone

## 2019-05-19 NOTE — Progress Notes (Signed)
Subjective:  Patient ID: Ricky [name], male    DOB: [birth year]  Age: 85 y.o. MRN: [id number]  CC: No chief complaint on file.   HPI Ricky [name] presents for CHF, HTN, hypothyroidism f/u C/o feet numbness  Outpatient Medications Prior to Visit  Medication Sig Dispense Refill  . apixaban (ELIQUIS) 5 MG TABS tablet Take 1 tablet (5 mg total) by mouth 2 (two) times daily. 180 tablet 3  . Ascorbic Acid (VITAMIN C PO) Take 1 tablet by mouth daily.    . Carboxymethylcellul-Glycerin (LUBRICATING EYE DROPS OP) Apply 1 drop to eye 5 (five) times daily.    . carvedilol (COREG) 12.5 MG tablet TAKE 1 TABLET BY MOUTH TWICE DAILY 180 tablet 1  . Cholecalciferol 1000 UNITS tablet Take 1,000 Units by mouth daily. Reported on 12/23/2015    . diphenhydrAMINE (BENADRYL) 25 mg capsule Take 1-2 capsules (25-50 mg total) by mouth every 4 (four) hours as needed for itching or allergies (1-2 for bee stings). 60 capsule 2  . EPINEPHrine (EPIPEN 2-PAK) 0.3 mg/0.3 mL DEVI Inject 0.3 mg into the muscle daily as needed (allergic reaction). For allergic reaction    . irbesartan (AVAPRO) 150 MG tablet Take 1 tablet (150 mg total) by mouth daily. 90 tablet 3  . levothyroxine (SYNTHROID, LEVOTHROID) 75 MCG tablet Take 75 mcg by mouth daily.    Marland Kitchen spironolactone (ALDACTONE) 25 MG tablet Take 1/2 (one-half) tablet by mouth once daily 45 tablet 3  . torsemide (DEMADEX) 20 MG tablet Take 2 tablets (40 mg total) by mouth daily. 180 tablet 3   No facility-administered medications prior to visit.     ROS: Review of Systems  Constitutional: Negative for appetite change, fatigue and unexpected weight change.  HENT: Negative for congestion, nosebleeds, sneezing, sore throat and trouble swallowing.   Eyes: Negative for itching and visual disturbance.  Respiratory: Negative for cough.   Cardiovascular: Negative for chest pain, palpitations and leg swelling.  Gastrointestinal: Negative for abdominal distention, blood in  stool, diarrhea and nausea.  Genitourinary: Negative for frequency and hematuria.  Musculoskeletal: Positive for gait problem. Negative for back pain, joint swelling and neck pain.  Skin: Negative for rash.  Neurological: Positive for weakness and numbness. Negative for dizziness, tremors and speech difficulty.  Psychiatric/Behavioral: Negative for agitation, dysphoric mood, sleep disturbance and suicidal ideas. The patient is not nervous/anxious.     Objective:  BP 128/62   Pulse 70   Temp (!) 97.5 F (36.4 C) (Oral)   Ht 5\' 7"  (1.702 m)   Wt 159 lb (72.1 kg)   SpO2 98%   BMI 24.90 kg/m   BP Readings from Last 3 Encounters:  05/19/19 128/62  03/24/19 132/80  11/10/18 (!) 154/86    Wt Readings from Last 3 Encounters:  05/19/19 159 lb (72.1 kg)  03/24/19 160 lb (72.6 kg)  11/10/18 156 lb (70.8 kg)    Physical Exam Constitutional:      General: He is not in acute distress.    Appearance: He is well-developed.     Comments: NAD  Eyes:     Conjunctiva/sclera: Conjunctivae normal.     Pupils: Pupils are equal, round, and reactive to light.  Neck:     Musculoskeletal: Normal range of motion.     Thyroid: No thyromegaly.     Vascular: No JVD.  Cardiovascular:     Rate and Rhythm: Normal rate. Rhythm irregular.     Heart sounds: Normal heart sounds. No murmur. No friction  rub. No gallop.   Pulmonary:     Effort: Pulmonary effort is normal. No respiratory distress.     Breath sounds: Normal breath sounds. No wheezing or rales.  Chest:     Chest wall: No tenderness.  Abdominal:     General: Bowel sounds are normal. There is no distension.     Palpations: Abdomen is soft. There is no mass.     Tenderness: There is no abdominal tenderness. There is no guarding or rebound.  Musculoskeletal: Normal range of motion.        General: No tenderness.  Lymphadenopathy:     Cervical: No cervical adenopathy.  Skin:    General: Skin is warm and dry.     Findings: No rash.   Neurological:     Mental Status: He is alert and oriented to person, place, and time.     Cranial Nerves: No cranial nerve deficit.     Motor: Weakness present. No abnormal muscle tone.     Coordination: Coordination normal.     Gait: Gait abnormal.     Deep Tendon Reflexes: Reflexes are normal and symmetric.  Psychiatric:        Behavior: Behavior normal.        Thought Content: Thought content normal.        Judgment: Judgment normal.   walked  Lab Results  Component Value Date   WBC 7.2 08/22/2018   HGB 12.1 (L) 08/22/2018   HCT 36.3 (L) 08/22/2018   PLT 234 08/22/2018   GLUCOSE 86 08/22/2018   CHOL 210 (H) 12/28/2013   TRIG 59.0 12/28/2013   HDL 63.40 12/28/2013   LDLCALC 135 (H) 12/28/2013   ALT 10 02/18/2018   AST 15 02/18/2018   NA 140 08/22/2018   K 5.0 08/22/2018   CL 99 08/22/2018   CREATININE 1.05 08/22/2018   BUN 35 08/22/2018   CO2 26 08/22/2018   TSH 1.11 07/30/2017   PSA 0.84 12/28/2013   INR 1.6 (H) 10/17/2016    No results found.  Assessment & Plan:   Diagnoses and all orders for this visit:  Need for influenza vaccination -     Flu Vaccine QUAD High Dose(Fluad)     No orders of the defined types were placed in this encounter.    Follow-up: No follow-ups on file.  Walker Kehr, MD

## 2019-05-19 NOTE — Assessment & Plan Note (Signed)
Coreg, Demadex 40 mg/d, Eliquis, Losartan, Aldactone Labs Wt daily

## 2019-05-20 ENCOUNTER — Encounter: Payer: Self-pay | Admitting: *Deleted

## 2019-05-26 ENCOUNTER — Other Ambulatory Visit: Payer: Self-pay

## 2019-05-26 ENCOUNTER — Telehealth (INDEPENDENT_AMBULATORY_CARE_PROVIDER_SITE_OTHER): Payer: Medicare HMO | Admitting: Internal Medicine

## 2019-05-26 ENCOUNTER — Encounter: Payer: Self-pay | Admitting: Internal Medicine

## 2019-05-26 VITALS — BP 126/62 | HR 70 | Ht 69.0 in | Wt 159.0 lb

## 2019-05-26 DIAGNOSIS — I442 Atrioventricular block, complete: Secondary | ICD-10-CM

## 2019-05-26 DIAGNOSIS — Z950 Presence of cardiac pacemaker: Secondary | ICD-10-CM | POA: Diagnosis not present

## 2019-05-26 DIAGNOSIS — I4821 Permanent atrial fibrillation: Secondary | ICD-10-CM | POA: Diagnosis not present

## 2019-05-26 NOTE — Progress Notes (Signed)
Electrophysiology TeleHealth Note   Due to national recommendations of social distancing due to COVID 19, an audio/video telehealth visit is felt to be most appropriate for this patient at this time.  See MyChart message from today for the patient's consent to telehealth for [facility].   Date:  05/26/2019   ID:  [name], DOB 06/01/1923, MRN [id number]  Location: patient's home  Provider location: [address], [place]  Evaluation Performed: Follow-up visit  PCP:  Plotnikov, Evie Lacks, MD  Cardiologist:     Electrophysiologist:  SK   Chief Complaint:  Atrial fib and complete heart block   History of Present Illness:    Ricky [name] is a 85 y.o. male who presents via audio/video conferencing for a telehealth visit today. The patient did not have access to video technology/had technical difficulties with video requiring transitioning to audio format only (telephone).  All issues noted in this document were discussed and addressed.  No physical exam could be performed with this format.      Since last being seen in our clinic, the patient reports doing really quite well.  He was notified by our office 6/20 that he had reverted to atrial fibrillation.  He was unaware of it.  Retrospectively, he had noted some changes in exercise tolerance but otherwise is largely without change.  Over the last couple months he has continued to nonrestrictive minimal fatigue.  However, in the context of the [place] pandemic he would like to avoid coming to the hospital and is "not that big a deal "  No chest pain or peripheral edema or nocturnal dyspnea     DATE TEST EF   2/14 Echo   60 % LAE   5/19 Echo    65 % (LAE 35/1.9/20)         Date Cr K Hgb  5/19 0.88    13.7  7/19     11.8  11/19 1.26 4.8    12/19 1.05 5.0 12.1  9/20 1.21 4.7 13.0   TSH  1.48  The patient denies symptoms of fevers, chills, cough, or new SOB worrisome for COVID 19.    Past  Medical History:  Diagnosis Date  . ANXIETY 04/26/2008  . Atrial fibrillation ([place]) 10/31/2010   Coumadin therapy;  Echo 6/12: EF 50-55%, moderate MR, moderate TR, PASP 52, mild LAE  . BPH (benign prostatic hypertrophy)   . CAP (community acquired pneumonia) 12/2017  . CHF (congestive heart failure) ([place])   . Essential hypertension, benign 10/03/2007  . HTN (hypertension) 06/07/2013  . HYPOTHYROIDISM 09/04/2007  . LYMPHADENOPATHY 04/06/2009  . Nocturia   . Pacemaker    Status post AV nodal ablation  . TOBACCO USE, QUIT 07/14/2009    Past Surgical History:  Procedure Laterality Date  . COLONOSCOPY    . EYE SURGERY  2000   eye muscle release  . EYE SURGERY     both cataracts  . HERNIA REPAIR    . INGUINAL HERNIA REPAIR Right 05/06/2013   Procedure: HERNIA REPAIR INGUINAL ADULT;  Surgeon: Haywood Lasso, MD;  Location: [facility];  Service: General;  Laterality: Right;  . PACEMAKER INSERTION  10/16/05  . PPM GENERATOR CHANGEOUT N/A 10/31/2016   Procedure: PPM Generator Changeout;  Surgeon: Deboraha Sprang, MD;  Location: [facility];  Service: Cardiovascular;  Laterality: N/A;    Current Outpatient Medications  Medication Sig Dispense Refill  . apixaban (ELIQUIS)  5 MG TABS tablet Take 1 tablet (5 mg total) by mouth 2 (two) times daily. 180 tablet 3  . Ascorbic Acid (VITAMIN C PO) Take 1 tablet by mouth daily.    . Carboxymethylcellul-Glycerin (LUBRICATING EYE DROPS OP) Apply 1 drop to eye 5 (five) times daily.    . carvedilol (COREG) 12.5 MG tablet TAKE 1 TABLET BY MOUTH TWICE DAILY 180 tablet 1  . Cholecalciferol 1000 UNITS tablet Take 1,000 Units by mouth daily. Reported on 12/23/2015    . diphenhydrAMINE (BENADRYL) 25 mg capsule Take 1-2 capsules (25-50 mg total) by mouth every 4 (four) hours as needed for itching or allergies (1-2 for bee stings). 60 capsule 2  . EPINEPHrine (EPIPEN 2-PAK) 0.3 mg/0.3 mL DEVI Inject 0.3 mg into the muscle daily as needed  (allergic reaction). For allergic reaction    . irbesartan (AVAPRO) 150 MG tablet Take 1 tablet (150 mg total) by mouth daily. 90 tablet 3  . levothyroxine (SYNTHROID, LEVOTHROID) 75 MCG tablet Take 75 mcg by mouth daily.    Marland Kitchen spironolactone (ALDACTONE) 25 MG tablet Take 1/2 (one-half) tablet by mouth once daily 45 tablet 3  . torsemide (DEMADEX) 20 MG tablet Take 2 tablets (40 mg total) by mouth daily. 180 tablet 3   No current facility-administered medications for this visit.     Allergies:   Bee venom, Amiodarone hcl, Atorvastatin, Benazepril hcl, Clindamycin/lincomycin, Clonidine hydrochloride, Colesevelam, Diltiazem hcl, Ezetimibe, Levaquin [levofloxacin in d5w], Verapamil, and Amoxicillin   Social History:  The patient  reports that he quit smoking about 40 years ago. His smoking use included cigarettes. He has a 30.00 pack-year smoking history. He has never used smokeless tobacco. He reports that he does not drink alcohol or use drugs.   Family History:  The patient's   family history includes Cancer in his mother; Heart disease in his father; Hypertension in his mother and another family member.   ROS:  Please see the history of present illness.   All other systems are personally reviewed and negative.    Exam:    Vital Signs:  BP 126/62   Pulse 70   Ht 5\' 9"  (1.753 m)   Wt 159 lb (72.1 kg)   BMI 23.48 kg/m        Labs/Other Tests and Data Reviewed:    Recent Labs: 05/19/2019: ALT 16; BUN 35; Creatinine, Ser 1.21; Hemoglobin 13.0; Platelets 214.0; Potassium 4.7; Sodium 139; TSH 1.48   Wt Readings from Last 3 Encounters:  05/26/19 159 lb (72.1 kg)  05/19/19 159 lb (72.1 kg)  03/24/19 160 lb (72.6 kg)     Other studies personally reviewed:   Last device remote is reviewed from [place] PDF dated   which reveals normal device function,   arrhythmias - AFib since 01/30/19   ASSESSMENT & PLAN:    Atrial fibrillation-thought to be permanent but now with evidence of sinus  rhythm  HFpEF  Complete heart block s/p AV ablation  Pacemaker-St. Jude   Hypertension well controlled   Persistent atrial fibrillation We will continue rate control. No clinical bleeding Blood pressure well controlled  COVID 19 screen The patient denies symptoms of COVID 19 at this time.  The importance of social distancing was discussed today.  Follow-up:    Next remote:     Current medicines are reviewed at length with the patient today.   The patient  concerns regarding his medicines.  The following changes were made today:    Labs/ tests ordered today  include:  No orders of the defined types were placed in this encounter.   Future tests ( post COVID )   in   months    Signed, Virl Axe, MD  05/26/2019 4:14 PM     [place] [address] [phone] (office) [phone] (fax)

## 2019-06-01 ENCOUNTER — Telehealth: Payer: Self-pay

## 2019-06-01 ENCOUNTER — Encounter: Payer: Medicare HMO | Admitting: *Deleted

## 2019-06-01 LAB — CUP PACEART REMOTE DEVICE CHECK
Battery Remaining Longevity: 79 mo
Battery Remaining Percentage: 89 %
Battery Voltage: 2.95 V
Brady Statistic AP VP Percent: 0 %
Brady Statistic AP VS Percent: 0 %
Brady Statistic AS VP Percent: 0 %
Brady Statistic AS VS Percent: 0 %
Brady Statistic RA Percent Paced: 0 %
Brady Statistic RV Percent Paced: 99 %
Date Time Interrogation Session: 20200914141719
Implantable Lead Implant Date: 19980824
Implantable Lead Implant Date: 19980824
Implantable Lead Location: 753859
Implantable Lead Location: 753860
Implantable Pulse Generator Implant Date: 20180214
Lead Channel Impedance Value: 340 Ohm
Lead Channel Impedance Value: 400 Ohm
Lead Channel Pacing Threshold Amplitude: 0.75 V
Lead Channel Pacing Threshold Amplitude: 1 V
Lead Channel Pacing Threshold Pulse Width: 0.4 ms
Lead Channel Pacing Threshold Pulse Width: 0.8 ms
Lead Channel Sensing Intrinsic Amplitude: 12 mV
Lead Channel Sensing Intrinsic Amplitude: 3.1 mV
Lead Channel Setting Pacing Amplitude: 2 V
Lead Channel Setting Pacing Amplitude: 2.5 V
Lead Channel Setting Pacing Pulse Width: 0.8 ms
Lead Channel Setting Sensing Sensitivity: 6 mV
Pulse Gen Model: 2272
Pulse Gen Serial Number: 7998385

## 2019-06-01 NOTE — Telephone Encounter (Signed)
Pt needed help sending a manual transmission. Transmission received. 

## 2019-06-09 IMAGING — DX DG CHEST 1V PORT
1 series · 1 of 1 positions shown · non-contrast
Comparison: 01/30/2018

CLINICAL DATA: Shortness of breath.

EXAM:
PORTABLE CHEST 1 VIEW

[chest ap]
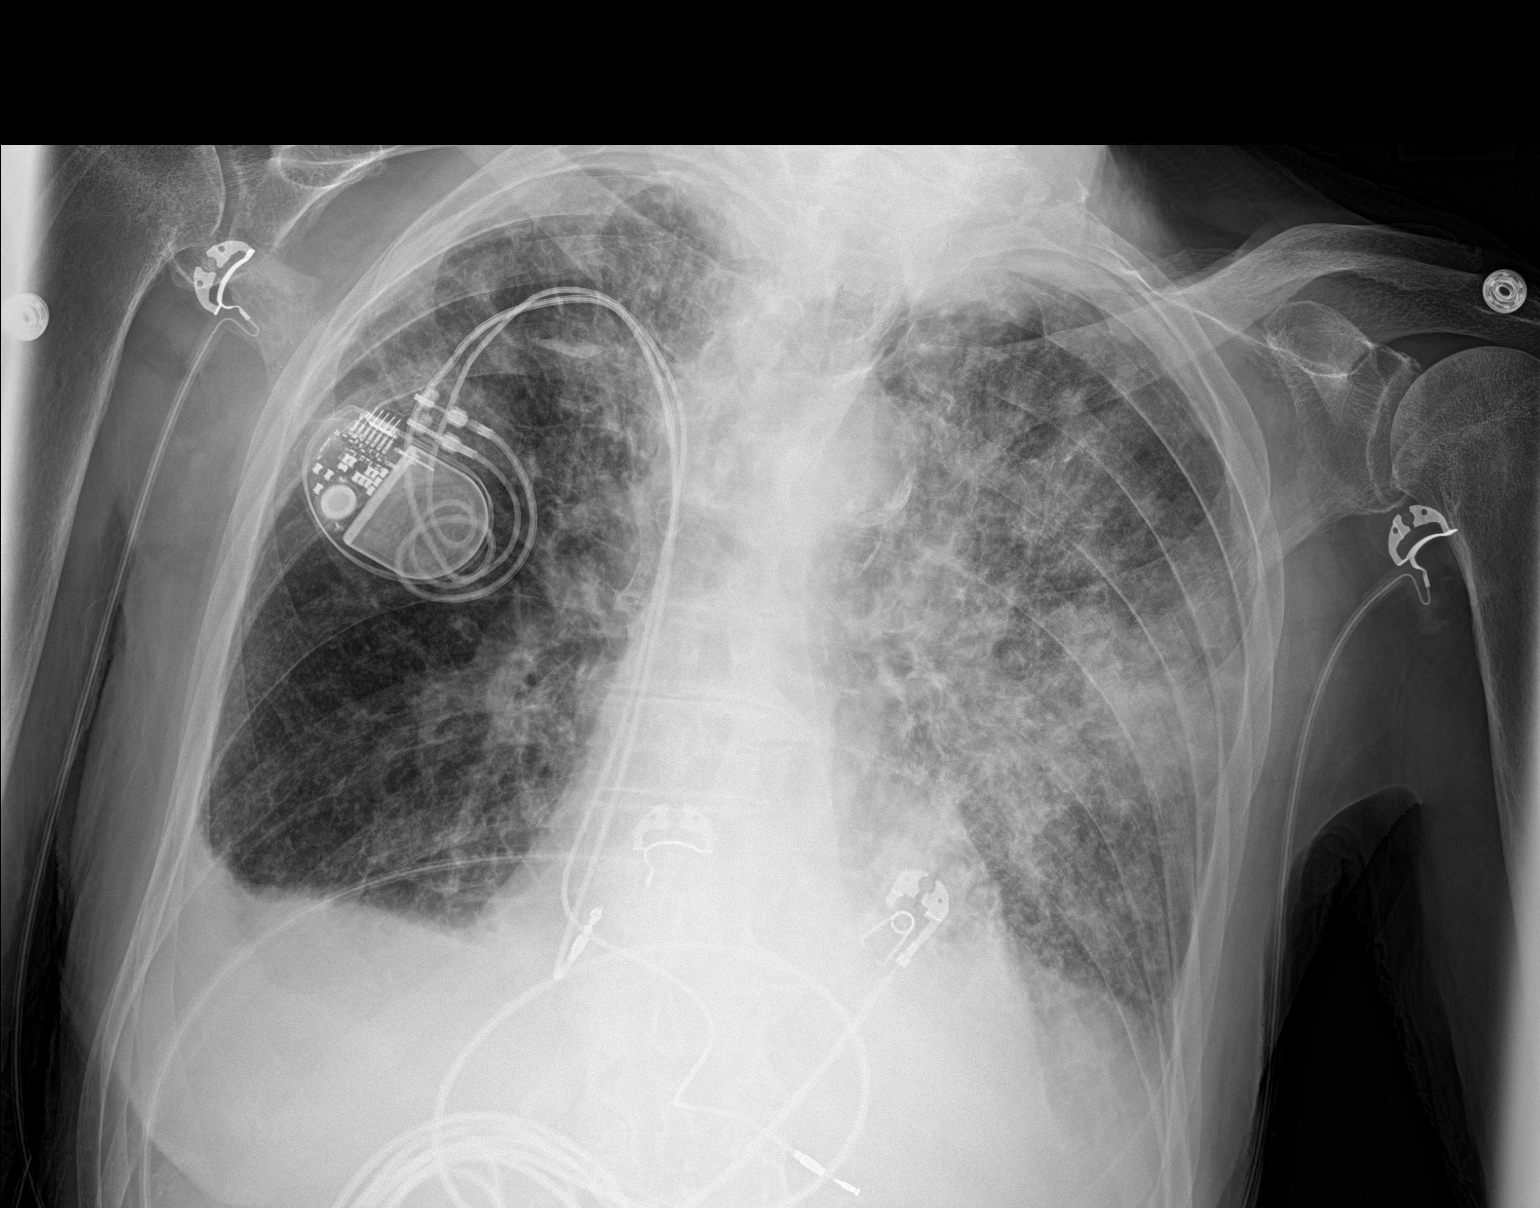

[1 of 1 positions shown; findings below may reference images not displayed]

FINDINGS: Right chest wall pacer device noted with lead in the right atrial
appendage and right ventricle. The heart size appears within normal
limits. Aortic atherosclerotic calcifications identified.. Bilateral
pleural effusions and pulmonary edema identified compatible with
CHF. Progressive airspace disease within the left upper lobe is
identified compatible with pneumonia.
IMPRESSION: 1. Left upper lobe pneumonia.
2. Congestive heart failure
3.  Aortic Atherosclerosis ([id number]).

## 2019-07-10 ENCOUNTER — Encounter: Payer: Self-pay | Admitting: Internal Medicine

## 2019-07-10 ENCOUNTER — Ambulatory Visit (INDEPENDENT_AMBULATORY_CARE_PROVIDER_SITE_OTHER): Payer: Medicare HMO | Admitting: Internal Medicine

## 2019-07-10 ENCOUNTER — Encounter: Payer: Self-pay | Admitting: Emergency Medicine

## 2019-07-10 ENCOUNTER — Other Ambulatory Visit: Payer: Self-pay

## 2019-07-10 ENCOUNTER — Ambulatory Visit
Admission: EM | Admit: 2019-07-10 | Discharge: 2019-07-10 | Disposition: A | Discharge status: Home / Self Care | Payer: Medicare HMO | Attending: Physician Assistant | Admitting: Physician Assistant

## 2019-07-10 DIAGNOSIS — Z1159 Encounter for screening for other viral diseases: Secondary | ICD-10-CM | POA: Diagnosis not present

## 2019-07-10 DIAGNOSIS — J029 Acute pharyngitis, unspecified: Secondary | ICD-10-CM | POA: Diagnosis not present

## 2019-07-10 DIAGNOSIS — I10 Essential (primary) hypertension: Secondary | ICD-10-CM

## 2019-07-10 NOTE — Discharge Instructions (Signed)
As discussed, cannot rule out COVID. Currently, no alarming signs. Testing ordered. I would like you to quarantine until testing results. You can use over the counter flonase/saline spray to help with possible allergies/post nasal drip. If experiencing shortness of breath, trouble breathing, go to the emergency department for further evaluation needed.

## 2019-07-10 NOTE — ED Notes (Signed)
Patient able to ambulate independently  
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
## 2019-07-10 NOTE — Assessment & Plan Note (Signed)
Started last night Has some mucus in his throat and transient left ear pain last night, but no other concerning symptoms Unfortunately because this is a phone call we are very limited with what we can do and he does want someone to look in his throat and would consider getting tested for Covid He will go to one of the [place] urgent care facilities that are near him-gave him the number and he knows where it is He will call back with any concerns or questions

## 2019-07-10 NOTE — ED Triage Notes (Signed)
Pt presents to [facility] for assessment of sore throat x 1 day with post-nasal drip.  States throat only hurts when he swallows (5/10).  Denies cough, denies SOB, denies n/v/d.

## 2019-07-10 NOTE — Progress Notes (Addendum)
Virtual Visit via telephone Note  I connected with Ricky [name] on 07/10/19 at  9:15 AM EDT by telephone and verified that I am speaking with the correct person using two identifiers.   I discussed the limitations of evaluation and management by telemedicine and the availability of in person appointments. The patient expressed understanding and agreed to proceed.  The patient is currently at home and I am in the office.    No referring provider.    History of Present Illness: This is an acute visit for sore throat.   It started last night.  It is sore when he swallows.  It feels like he has mucus in his throat and needs to clear his throat.  The mucus is thick.  He denies any cough, shortness of breath and wheezing.  He has checked his temperature and it is normal.  He has had a little left ear pain last night, but that has resolved.  He is concerned about why he has a sore throat would like to know why.  He states he has not been anywhere.  The last person he saw was Dr. Alain Marion at his last appointment about a month ago.  He lives with his daughter.  If they do have any visitors, which is not often, he stays in his room.  He denies any heartburn.  He denies any fall allergies.  He wonders if we can look in his throat.  Review of Systems  Constitutional: Negative for fever and malaise/fatigue.  HENT: Positive for ear pain (left ear last night only) and sore throat. Negative for congestion and sinus pain.        Mucus in throat  Respiratory: Negative for cough, shortness of breath and wheezing.   Gastrointestinal: Negative for diarrhea, heartburn and nausea.  Musculoskeletal: Negative for myalgias.  Neurological: Negative for dizziness and headaches.      Social History   Socioeconomic History  . Marital status: Widowed    Spouse name: Not on file  . Number of children: 2  . Years of education: Not on file  . Highest education level: Not on file  Occupational History  .  Not on file  Social Needs  . Financial resource strain: Not hard at all  . Food insecurity    Worry: Never true    Inability: Never true  . Transportation needs    Medical: No    Non-medical: No  Tobacco Use  . Smoking status: Former Smoker    Packs/day: 1.00    Years: 30.00    Pack years: 30.00    Types: Cigarettes    Quit date: 05/01/1979    Years since quitting: 40.2  . Smokeless tobacco: Never Used  Substance and Sexual Activity  . Alcohol use: No    Alcohol/week: 0.0 standard drinks  . Drug use: No  . Sexual activity: Never  Lifestyle  . Physical activity    Days per week: 6 days    Minutes per session: 40 min  . Stress: Not at all  Relationships  . Social connections    Talks on phone: More than three times a week    Gets together: More than three times a week    Attends religious service: Never    Active member of club or organization: No    Attends meetings of clubs or organizations: Never    Relationship status: Widowed  Other Topics Concern  . Not on file  Social History Narrative   Lost his  wife 2002 ; Her health was not good.      Assessment and Plan:  See Problem List for Assessment and Plan of chronic medical problems.   Follow Up Instructions:    I discussed the assessment and treatment plan with the patient. The patient was provided an opportunity to ask questions and all were answered. The patient agreed with the plan and demonstrated an understanding of the instructions.   The patient was advised to call back or seek an in-person evaluation if the symptoms worsen or if the condition fails to improve as anticipated.  Time spent on telephone call - 8 minutes  Binnie Rail, MD

## 2019-07-12 LAB — NOVEL CORONAVIRUS, NAA: SARS-CoV-2, NAA: NOT DETECTED

## 2019-08-15 ENCOUNTER — Other Ambulatory Visit: Payer: Self-pay | Admitting: Internal Medicine

## 2019-09-22 ENCOUNTER — Ambulatory Visit: Payer: Medicare HMO | Admitting: Internal Medicine

## 2019-09-28 ENCOUNTER — Ambulatory Visit: Payer: Medicare Other | Attending: Internal Medicine

## 2019-09-28 ENCOUNTER — Ambulatory Visit (INDEPENDENT_AMBULATORY_CARE_PROVIDER_SITE_OTHER): Payer: Medicare HMO | Admitting: *Deleted

## 2019-09-28 DIAGNOSIS — I442 Atrioventricular block, complete: Secondary | ICD-10-CM

## 2019-09-28 DIAGNOSIS — Z23 Encounter for immunization: Secondary | ICD-10-CM | POA: Insufficient documentation

## 2019-09-28 LAB — CUP PACEART REMOTE DEVICE CHECK
Battery Remaining Longevity: 84 mo
Battery Remaining Percentage: 95.5 %
Battery Voltage: 2.96 V
Brady Statistic AP VP Percent: 0 %
Brady Statistic AP VS Percent: 0 %
Brady Statistic AS VP Percent: 0 %
Brady Statistic AS VS Percent: 0 %
Brady Statistic RA Percent Paced: 0 %
Brady Statistic RV Percent Paced: 99 %
Date Time Interrogation Session: 20210110101733
Implantable Lead Implant Date: 19980824
Implantable Lead Implant Date: 19980824
Implantable Lead Location: 753859
Implantable Lead Location: 753860
Implantable Pulse Generator Implant Date: 20180214
Lead Channel Impedance Value: 340 Ohm
Lead Channel Impedance Value: 380 Ohm
Lead Channel Pacing Threshold Amplitude: 0.75 V
Lead Channel Pacing Threshold Amplitude: 1 V
Lead Channel Pacing Threshold Pulse Width: 0.4 ms
Lead Channel Pacing Threshold Pulse Width: 0.8 ms
Lead Channel Sensing Intrinsic Amplitude: 12 mV
Lead Channel Sensing Intrinsic Amplitude: 3.1 mV
Lead Channel Setting Pacing Amplitude: 2 V
Lead Channel Setting Pacing Amplitude: 2.5 V
Lead Channel Setting Pacing Pulse Width: 0.8 ms
Lead Channel Setting Sensing Sensitivity: 6 mV
Pulse Gen Model: 2272
Pulse Gen Serial Number: 7998385

## 2019-09-28 NOTE — Progress Notes (Signed)
   Covid-19 Vaccination Clinic  Name:  Ricky [name]    MRN: [id number] DOB: 1923/07/16  09/28/2019  Ricky [name] was observed post Covid-19 immunization for 30 minutes based on pre-vaccination screening without incidence. He was provided with Vaccine Information Sheet and instruction to access the V-Safe system.   Ricky [name] was instructed to call 911 with any severe reactions post vaccine: Marland Kitchen Difficulty breathing  . Swelling of your face and throat  . A fast heartbeat  . A bad rash all over your body  . Dizziness and weakness    Immunizations Administered    Name Date Dose VIS Date Route   Pfizer COVID-19 Vaccine 09/28/2019  9:33 AM 0.3 mL 08/28/2019 Intramuscular   Manufacturer: [place]   Lot: [id number]   [place]: [id number]

## 2019-10-16 ENCOUNTER — Ambulatory Visit: Payer: Medicare Other

## 2019-10-17 ENCOUNTER — Ambulatory Visit: Payer: Medicare HMO

## 2019-10-23 ENCOUNTER — Ambulatory Visit: Payer: Medicare HMO | Attending: Internal Medicine

## 2019-10-23 DIAGNOSIS — Z23 Encounter for immunization: Secondary | ICD-10-CM | POA: Insufficient documentation

## 2019-10-23 NOTE — Progress Notes (Signed)
   Covid-19 Vaccination Clinic  Name:  Ricky [name]    MRN: [id number] DOB: 05-16-1923  10/23/2019  Ricky [name] was observed post Covid-19 immunization for 15 minutes without incidence. He was provided with Vaccine Information Sheet and instruction to access the V-Safe system.   Ricky [name] was instructed to call 911 with any severe reactions post vaccine: Marland Kitchen Difficulty breathing  . Swelling of your face and throat  . A fast heartbeat  . A bad rash all over your body  . Dizziness and weakness    Immunizations Administered    Name Date Dose VIS Date Route   Pfizer COVID-19 Vaccine 10/23/2019 12:59 PM 0.3 mL 08/28/2019 Intramuscular   Manufacturer: [place]   Lot: [id number]   [place]: [id number]

## 2019-11-02 ENCOUNTER — Other Ambulatory Visit: Payer: Self-pay

## 2019-11-02 ENCOUNTER — Encounter: Payer: Self-pay | Admitting: Internal Medicine

## 2019-11-02 ENCOUNTER — Ambulatory Visit (INDEPENDENT_AMBULATORY_CARE_PROVIDER_SITE_OTHER): Payer: Medicare HMO | Admitting: Internal Medicine

## 2019-11-02 DIAGNOSIS — I4821 Permanent atrial fibrillation: Secondary | ICD-10-CM | POA: Diagnosis not present

## 2019-11-02 DIAGNOSIS — I10 Essential (primary) hypertension: Secondary | ICD-10-CM

## 2019-11-02 DIAGNOSIS — I5032 Chronic diastolic (congestive) heart failure: Secondary | ICD-10-CM

## 2019-11-02 LAB — BASIC METABOLIC PANEL
BUN: 33 mg/dL — ABNORMAL HIGH (ref 6–23)
CO2: 32 mEq/L (ref 19–32)
Calcium: 9.7 mg/dL (ref 8.4–10.5)
Chloride: 103 mEq/L (ref 96–112)
Creatinine, Ser: 1.12 mg/dL (ref 0.40–1.50)
GFR: 61.02 mL/min (ref >60.00)
Glucose, Bld: 96 mg/dL (ref 70–99)
Potassium: 5.1 mEq/L (ref 3.5–5.1)
Sodium: 141 mEq/L (ref 135–145)

## 2019-11-02 MED ORDER — IRBESARTAN 150 MG PO TABS: 150.0000 mg | ORAL_TABLET | Freq: Every day | ORAL | 3 refills | Status: DC

## 2019-11-02 MED ORDER — TRIAMCINOLONE ACETONIDE 0.1 % EX OINT: 1.0000 "application " | TOPICAL_OINTMENT | Freq: Three times a day (TID) | CUTANEOUS | 2 refills | Status: DC | PRN

## 2019-11-02 NOTE — Assessment & Plan Note (Signed)
Rate controlled 

## 2019-11-02 NOTE — Assessment & Plan Note (Signed)
Normal BP at home Coreg, Demadex, Irbesartan

## 2019-11-02 NOTE — Addendum Note (Signed)
Addended by: Raliegh Ip on: 11/02/2019 03:40 PM   Modules accepted: Orders

## 2019-11-02 NOTE — Progress Notes (Signed)
Subjective:  Patient ID: Ricky [name], male    DOB: July 22, 1923  Age: 86 y.o. MRN: [id number]  CC: No chief complaint on file.   HPI LEANNE BUCKERT presents for A fib, HTN, hyperlipidemia f/u  Outpatient Medications Prior to Visit  Medication Sig Dispense Refill  . apixaban (ELIQUIS) 5 MG TABS tablet Take 1 tablet (5 mg total) by mouth 2 (two) times daily. 180 tablet 3  . Ascorbic Acid (VITAMIN C PO) Take 1 tablet by mouth daily.    . Carboxymethylcellul-Glycerin (LUBRICATING EYE DROPS OP) Apply 1 drop to eye 5 (five) times daily.    . carvedilol (COREG) 12.5 MG tablet Take 1 tablet by mouth twice daily 180 tablet 0  . Cholecalciferol 1000 UNITS tablet Take 1,000 Units by mouth daily. Reported on 12/23/2015    . diphenhydrAMINE (BENADRYL) 25 mg capsule Take 1-2 capsules (25-50 mg total) by mouth every 4 (four) hours as needed for itching or allergies (1-2 for bee stings). 60 capsule 2  . EPINEPHrine (EPIPEN 2-PAK) 0.3 mg/0.3 mL DEVI Inject 0.3 mg into the muscle daily as needed (allergic reaction). For allergic reaction    . irbesartan (AVAPRO) 150 MG tablet Take 1 tablet (150 mg total) by mouth daily. 90 tablet 3  . levothyroxine (SYNTHROID, LEVOTHROID) 75 MCG tablet Take 75 mcg by mouth daily.    Marland Kitchen spironolactone (ALDACTONE) 25 MG tablet Take 1/2 (one-half) tablet by mouth once daily 45 tablet 3  . torsemide (DEMADEX) 20 MG tablet Take 2 tablets (40 mg total) by mouth daily. 180 tablet 3   No facility-administered medications prior to visit.    ROS: Review of Systems  Constitutional: Positive for unexpected weight change. Negative for appetite change and fatigue.  HENT: Negative for congestion, nosebleeds, sneezing, sore throat and trouble swallowing.   Eyes: Negative for itching and visual disturbance.  Respiratory: Negative for cough.   Cardiovascular: Negative for chest pain, palpitations and leg swelling.  Gastrointestinal: Negative for abdominal distention, blood in  stool, diarrhea and nausea.  Genitourinary: Negative for frequency and hematuria.  Musculoskeletal: Positive for gait problem. Negative for back pain, joint swelling and neck pain.  Skin: Negative for rash.  Neurological: Negative for dizziness, tremors, speech difficulty and weakness.  Psychiatric/Behavioral: Negative for agitation, dysphoric mood, sleep disturbance and suicidal ideas. The patient is not nervous/anxious.     Objective:  BP (!) 154/92 (BP Location: Left Arm, Patient Position: Sitting, Cuff Size: Normal)   Pulse 76   Temp 97.9 F (36.6 C) (Oral)   Ht 5\' 9"  (1.753 m)   Wt 155 lb (70.3 kg)   BMI 22.89 kg/m   BP Readings from Last 3 Encounters:  11/02/19 (!) 154/92  07/10/19 (!) 170/96  05/26/19 126/62    Wt Readings from Last 3 Encounters:  11/02/19 155 lb (70.3 kg)  05/26/19 159 lb (72.1 kg)  05/19/19 159 lb (72.1 kg)    Physical Exam Constitutional:      General: He is not in acute distress.    Appearance: He is well-developed.     Comments: NAD  Eyes:     Conjunctiva/sclera: Conjunctivae normal.     Pupils: Pupils are equal, round, and reactive to light.  Neck:     Thyroid: No thyromegaly.     Vascular: No JVD.  Cardiovascular:     Rate and Rhythm: Normal rate and regular rhythm.     Heart sounds: Normal heart sounds. No murmur. No friction rub. No gallop.   Pulmonary:  Effort: Pulmonary effort is normal. No respiratory distress.     Breath sounds: Normal breath sounds. No wheezing or rales.  Chest:     Chest wall: No tenderness.  Abdominal:     General: Bowel sounds are normal. There is no distension.     Palpations: Abdomen is soft. There is no mass.     Tenderness: There is no abdominal tenderness. There is no guarding or rebound.  Musculoskeletal:        General: No tenderness. Normal range of motion.     Cervical back: Normal range of motion.  Lymphadenopathy:     Cervical: No cervical adenopathy.  Skin:    General: Skin is warm and  dry.     Findings: No rash.  Neurological:     Mental Status: He is alert and oriented to person, place, and time.     Cranial Nerves: No cranial nerve deficit.     Motor: No abnormal muscle tone.     Coordination: Coordination normal.     Gait: Gait abnormal.     Deep Tendon Reflexes: Reflexes are normal and symmetric.  Psychiatric:        Behavior: Behavior normal.        Thought Content: Thought content normal.        Judgment: Judgment normal.    Walker   Lab Results  Component Value Date   WBC 5.8 05/19/2019   HGB 13.0 05/19/2019   HCT 38.8 (L) 05/19/2019   PLT 214.0 05/19/2019   GLUCOSE 63 (L) 05/19/2019   CHOL 198 05/19/2019   TRIG 104.0 05/19/2019   HDL 55.40 05/19/2019   LDLCALC 122 (H) 05/19/2019   ALT 16 05/19/2019   AST 21 05/19/2019   NA 139 05/19/2019   K 4.7 05/19/2019   CL 101 05/19/2019   CREATININE 1.21 05/19/2019   BUN 35 (H) 05/19/2019   CO2 32 05/19/2019   TSH 1.48 05/19/2019   PSA 0.84 12/28/2013   INR 1.6 (H) 10/17/2016    No results found.  Assessment & Plan:    Walker Kehr, MD

## 2019-11-10 ENCOUNTER — Other Ambulatory Visit: Payer: Self-pay | Admitting: Internal Medicine

## 2019-12-28 ENCOUNTER — Ambulatory Visit (INDEPENDENT_AMBULATORY_CARE_PROVIDER_SITE_OTHER): Payer: Medicare HMO | Admitting: *Deleted

## 2019-12-28 ENCOUNTER — Telehealth: Payer: Self-pay

## 2019-12-28 DIAGNOSIS — I442 Atrioventricular block, complete: Secondary | ICD-10-CM | POA: Diagnosis not present

## 2019-12-28 LAB — CUP PACEART REMOTE DEVICE CHECK
Battery Remaining Longevity: 77 mo
Battery Remaining Percentage: 89 %
Battery Voltage: 2.95 V
Brady Statistic AP VP Percent: 0 %
Brady Statistic AP VS Percent: 0 %
Brady Statistic AS VP Percent: 0 %
Brady Statistic AS VS Percent: 0 %
Brady Statistic RA Percent Paced: 0 %
Brady Statistic RV Percent Paced: 99 %
Date Time Interrogation Session: 20210412095951
Implantable Lead Implant Date: 19980824
Implantable Lead Implant Date: 19980824
Implantable Lead Location: 753859
Implantable Lead Location: 753860
Implantable Pulse Generator Implant Date: 20180214
Lead Channel Impedance Value: 330 Ohm
Lead Channel Impedance Value: 380 Ohm
Lead Channel Pacing Threshold Amplitude: 0.75 V
Lead Channel Pacing Threshold Amplitude: 1 V
Lead Channel Pacing Threshold Pulse Width: 0.4 ms
Lead Channel Pacing Threshold Pulse Width: 0.8 ms
Lead Channel Sensing Intrinsic Amplitude: 12 mV
Lead Channel Sensing Intrinsic Amplitude: 3.1 mV
Lead Channel Setting Pacing Amplitude: 2 V
Lead Channel Setting Pacing Amplitude: 2.5 V
Lead Channel Setting Pacing Pulse Width: 0.8 ms
Lead Channel Setting Sensing Sensitivity: 6 mV
Pulse Gen Model: 2272
Pulse Gen Serial Number: 7998385

## 2019-12-28 NOTE — Telephone Encounter (Signed)
Pt called about having issues with his home monitor, I gave him tech support number; pt will call back and let us know what they say.

## 2019-12-28 NOTE — Telephone Encounter (Signed)
Patient called back and transmission was sent successfully.

## 2019-12-29 NOTE — Progress Notes (Signed)
PPM Remote  

## 2020-02-05 ENCOUNTER — Other Ambulatory Visit: Payer: Self-pay | Admitting: Internal Medicine

## 2020-02-05 ENCOUNTER — Other Ambulatory Visit: Payer: Self-pay | Admitting: Physician Assistant

## 2020-03-28 ENCOUNTER — Telehealth: Payer: Self-pay

## 2020-03-28 ENCOUNTER — Ambulatory Visit (INDEPENDENT_AMBULATORY_CARE_PROVIDER_SITE_OTHER): Payer: Medicare HMO | Admitting: *Deleted

## 2020-03-28 DIAGNOSIS — I442 Atrioventricular block, complete: Secondary | ICD-10-CM

## 2020-03-28 NOTE — Telephone Encounter (Signed)
Pt needed help sending his transmission. Transmission received. I told him if the nurse see anything she will give him a call but if it looks normal he will not receive a call.

## 2020-03-29 LAB — CUP PACEART REMOTE DEVICE CHECK
Battery Remaining Longevity: 77 mo
Battery Remaining Percentage: 89 %
Battery Voltage: 2.95 V
Brady Statistic AP VP Percent: 0 %
Brady Statistic AP VS Percent: 0 %
Brady Statistic AS VP Percent: 0 %
Brady Statistic AS VS Percent: 0 %
Brady Statistic RA Percent Paced: 0 %
Brady Statistic RV Percent Paced: 99 %
Date Time Interrogation Session: 20210712135712
Implantable Lead Implant Date: 19980824
Implantable Lead Implant Date: 19980824
Implantable Lead Location: 753859
Implantable Lead Location: 753860
Implantable Pulse Generator Implant Date: 20180214
Lead Channel Impedance Value: 330 Ohm
Lead Channel Impedance Value: 360 Ohm
Lead Channel Pacing Threshold Amplitude: 0.75 V
Lead Channel Pacing Threshold Amplitude: 1 V
Lead Channel Pacing Threshold Pulse Width: 0.4 ms
Lead Channel Pacing Threshold Pulse Width: 0.8 ms
Lead Channel Sensing Intrinsic Amplitude: 12 mV
Lead Channel Sensing Intrinsic Amplitude: 3.1 mV
Lead Channel Setting Pacing Amplitude: 2 V
Lead Channel Setting Pacing Amplitude: 2.5 V
Lead Channel Setting Pacing Pulse Width: 0.8 ms
Lead Channel Setting Sensing Sensitivity: 6 mV
Pulse Gen Model: 2272
Pulse Gen Serial Number: 7998385

## 2020-03-29 NOTE — Progress Notes (Signed)
Remote pacemaker transmission.   

## 2020-03-31 ENCOUNTER — Ambulatory Visit (INDEPENDENT_AMBULATORY_CARE_PROVIDER_SITE_OTHER): Payer: Medicare HMO

## 2020-03-31 DIAGNOSIS — Z0000 Encounter for general adult medical examination without abnormal findings: Secondary | ICD-10-CM | POA: Diagnosis not present

## 2020-03-31 NOTE — Patient Instructions (Addendum)
Ricky [name] , Thank you for taking time to come for your Medicare Wellness Visit. I appreciate your ongoing commitment to your health goals. Please review the following plan we discussed and let me know if I can assist you in the future.   Screening recommendations/referrals: Colonoscopy: no repeat due to age Recommended yearly ophthalmology/optometry visit for glaucoma screening and checkup Recommended yearly dental visit for hygiene and checkup  Vaccinations: Influenza vaccine: 05/19/2019 Pneumococcal vaccine: completed Tdap vaccine: 12/28/2016 Shingles vaccine: never done   Covid-19: completed  Advanced directives: Please bring a copy of your health care power of attorney and living will to the office at your convenience.   Conditions/risks identified: Yes; Please continue to do your personal lifestyle choices by: daily care of teeth and gums, regular physical activity (goal should be 5 days a week for 30 minutes), eat a healthy diet, avoid tobacco and drug use, limiting any alcohol intake, taking a low-dose aspirin (if not allergic or have been advised by your provider otherwise) and taking vitamins and minerals as recommended by your provider. Continue doing brain stimulating activities (puzzles, reading, adult coloring books, staying active) to keep memory sharp. Continue to eat heart healthy diet (full of fruits, vegetables, whole grains, lean protein, water--limit salt, fat, and sugar intake) and increase physical activity as tolerated.  Next appointment: Please schedule your next Medicare Wellness Visit with your Nurse Health Advisor in 1 year.  Preventive Care 30 Years and Older, Male Preventive care refers to lifestyle choices and visits with your health care provider that can promote health and wellness. What does preventive care include?  A yearly physical exam. This is also called an annual well check.  Dental exams once or twice a year.  Routine eye exams. Ask your health  care provider how often you should have your eyes checked.  Personal lifestyle choices, including:  Daily care of your teeth and gums.  Regular physical activity.  Eating a healthy diet.  Avoiding tobacco and drug use.  Limiting alcohol use.  Practicing safe sex.  Taking low doses of aspirin every day.  Taking vitamin and mineral supplements as recommended by your health care provider. What happens during an annual well check? The services and screenings done by your health care provider during your annual well check will depend on your age, overall health, lifestyle risk factors, and family history of disease. Counseling  Your health care provider may ask you questions about your:  Alcohol use.  Tobacco use.  Drug use.  Emotional well-being.  Home and relationship well-being.  Sexual activity.  Eating habits.  History of falls.  Memory and ability to understand (cognition).  Work and work Statistician. Screening  You may have the following tests or measurements:  Height, weight, and BMI.  Blood pressure.  Lipid and cholesterol levels. These may be checked every 5 years, or more frequently if you are over 31 years old.  Skin check.  Lung cancer screening. You may have this screening every year starting at age 38 if you have a 30-pack-year history of smoking and currently smoke or have quit within the past 15 years.  Fecal occult blood test (FOBT) of the stool. You may have this test every year starting at age 53.  Flexible sigmoidoscopy or colonoscopy. You may have a sigmoidoscopy every 5 years or a colonoscopy every 10 years starting at age 81.  Prostate cancer screening. Recommendations will vary depending on your family history and other risks.  Hepatitis C blood test.  Hepatitis B blood test.  Sexually transmitted disease (STD) testing.  Diabetes screening. This is done by checking your blood sugar (glucose) after you have not eaten for a while  (fasting). You may have this done every 1-3 years.  Abdominal aortic aneurysm (AAA) screening. You may need this if you are a current or former smoker.  Osteoporosis. You may be screened starting at age 37 if you are at high risk. Talk with your health care provider about your test results, treatment options, and if necessary, the need for more tests. Vaccines  Your health care provider may recommend certain vaccines, such as:  Influenza vaccine. This is recommended every year.  Tetanus, diphtheria, and acellular pertussis (Tdap, Td) vaccine. You may need a Td booster every 10 years.  Zoster vaccine. You may need this after age 70.  Pneumococcal 13-valent conjugate (PCV13) vaccine. One dose is recommended after age 14.  Pneumococcal polysaccharide (PPSV23) vaccine. One dose is recommended after age 71. Talk to your health care provider about which screenings and vaccines you need and how often you need them. This information is not intended to replace advice given to you by your health care provider. Make sure you discuss any questions you have with your health care provider. Document Released: 09/30/2015 Document Revised: 05/23/2016 Document Reviewed: 07/05/2015 Elsevier Interactive Patient Education  2017 [place] Prevention in the Home Falls can cause injuries. They can happen to people of all ages. There are many things you can do to make your home safe and to help prevent falls. What can I do on the outside of my home?  Regularly fix the edges of walkways and driveways and fix any cracks.  Remove anything that might make you trip as you walk through a door, such as a raised step or threshold.  Trim any bushes or trees on the path to your home.  Use bright outdoor lighting.  Clear any walking paths of anything that might make someone trip, such as rocks or tools.  Regularly check to see if handrails are loose or broken. Make sure that both sides of any steps have  handrails.  Any raised decks and porches should have guardrails on the edges.  Have any leaves, snow, or ice cleared regularly.  Use sand or salt on walking paths during winter.  Clean up any spills in your garage right away. This includes oil or grease spills. What can I do in the bathroom?  Use night lights.  Install grab bars by the toilet and in the tub and shower. Do not use towel bars as grab bars.  Use non-skid mats or decals in the tub or shower.  If you need to sit down in the shower, use a plastic, non-slip stool.  Keep the floor dry. Clean up any water that spills on the floor as soon as it happens.  Remove soap buildup in the tub or shower regularly.  Attach bath mats securely with double-sided non-slip rug tape.  Do not have throw rugs and other things on the floor that can make you trip. What can I do in the bedroom?  Use night lights.  Make sure that you have a light by your bed that is easy to reach.  Do not use any sheets or blankets that are too big for your bed. They should not hang down onto the floor.  Have a firm chair that has side arms. You can use this for support while you get dressed.  Do not  have throw rugs and other things on the floor that can make you trip. What can I do in the kitchen?  Clean up any spills right away.  Avoid walking on wet floors.  Keep items that you use a lot in easy-to-reach places.  If you need to reach something above you, use a strong step stool that has a grab bar.  Keep electrical cords out of the way.  Do not use floor polish or wax that makes floors slippery. If you must use wax, use non-skid floor wax.  Do not have throw rugs and other things on the floor that can make you trip. What can I do with my stairs?  Do not leave any items on the stairs.  Make sure that there are handrails on both sides of the stairs and use them. Fix handrails that are broken or loose. Make sure that handrails are as long as  the stairways.  Check any carpeting to make sure that it is firmly attached to the stairs. Fix any carpet that is loose or worn.  Avoid having throw rugs at the top or bottom of the stairs. If you do have throw rugs, attach them to the floor with carpet tape.  Make sure that you have a light switch at the top of the stairs and the bottom of the stairs. If you do not have them, ask someone to add them for you. What else can I do to help prevent falls?  Wear shoes that:  Do not have high heels.  Have rubber bottoms.  Are comfortable and fit you well.  Are closed at the toe. Do not wear sandals.  If you use a stepladder:  Make sure that it is fully opened. Do not climb a closed stepladder.  Make sure that both sides of the stepladder are locked into place.  Ask someone to hold it for you, if possible.  Clearly mark and make sure that you can see:  Any grab bars or handrails.  First and last steps.  Where the edge of each step is.  Use tools that help you move around (mobility aids) if they are needed. These include:  Canes.  Walkers.  Scooters.  Crutches.  Turn on the lights when you go into a dark area. Replace any light bulbs as soon as they burn out.  Set up your furniture so you have a clear path. Avoid moving your furniture around.  If any of your floors are uneven, fix them.  If there are any pets around you, be aware of where they are.  Review your medicines with your doctor. Some medicines can make you feel dizzy. This can increase your chance of falling. Ask your doctor what other things that you can do to help prevent falls. This information is not intended to replace advice given to you by your health care provider. Make sure you discuss any questions you have with your health care provider. Document Released: 06/30/2009 Document Revised: 02/09/2016 Document Reviewed: 10/08/2014 Elsevier Interactive Patient Education  2017 [employer].

## 2020-03-31 NOTE — Progress Notes (Addendum)
I connected with Rhett Bannister today by telephone and verified that I am speaking with the correct person using two identifiers. Location patient: home Location provider: work Persons participating in the virtual visit: [name] and [employer]. Tiffiney Sparrow, LPN   I discussed the limitations, risks, security and privacy concerns of performing an evaluation and management service by telephone and the availability of in person appointments. I also discussed with the patient that there may be a patient responsible charge related to this service. The patient expressed understanding and verbally consented to this telephonic visit.    Interactive audio and video telecommunications were attempted between this provider and patient, however failed, due to patient having technical difficulties OR patient did not have access to video capability.  We continued and completed visit with audio only.  Some vital signs may be absent or patient reported.   Time Spent with patient on telephone encounter: 20 minutes  Subjective:   Ricky [name] is a 86 y.o. male who presents for Medicare Annual/Subsequent preventive examination.  Review of Systems    No ROS. Medicare Wellness Visit Cardiac Risk Factors include: advanced age (>2men, >42 women);family history of premature cardiovascular disease;hypertension;male gender     Objective:    There were no vitals filed for this visit. There is no height or weight on file to calculate BMI.  Advanced Directives 03/31/2020 11/10/2018 02/10/2018 12/22/2017 10/31/2016 04/16/2016 09/14/2015  Does Patient Have a Medical Advance Directive? Yes Yes Yes Yes Yes Yes No  Type of Advance Directive - [place];Living will Healthcare Power of [place];Living will - -  Does patient want to make changes to medical advance directive? No - Patient declined - No - Patient declined No - Patient declined No  - Patient declined - -  Copy of [place] in Chart? - No - copy requested - - Yes No - copy requested -  Would patient like information on creating a medical advance directive? - - - - - - No - patient declined information    Current Medications (verified) Outpatient Encounter Medications as of 03/31/2020  Medication Sig   apixaban (ELIQUIS) 5 MG TABS tablet Take 1 tablet (5 mg total) by mouth 2 (two) times daily.   Ascorbic Acid (VITAMIN C PO) Take 1 tablet by mouth daily.   Carboxymethylcellul-Glycerin (LUBRICATING EYE DROPS OP) Apply 1 drop to eye 5 (five) times daily.   carvedilol (COREG) 12.5 MG tablet Take 1 tablet by mouth twice daily   Cholecalciferol 1000 UNITS tablet Take 1,000 Units by mouth daily. Reported on 12/23/2015   diphenhydrAMINE (BENADRYL) 25 mg capsule Take 1-2 capsules (25-50 mg total) by mouth every 4 (four) hours as needed for itching or allergies (1-2 for bee stings).   EPINEPHrine (EPIPEN 2-PAK) 0.3 mg/0.3 mL DEVI Inject 0.3 mg into the muscle daily as needed (allergic reaction). For allergic reaction   irbesartan (AVAPRO) 150 MG tablet Take 1 tablet (150 mg total) by mouth daily.   levothyroxine (SYNTHROID, LEVOTHROID) 75 MCG tablet Take 75 mcg by mouth daily.   spironolactone (ALDACTONE) 25 MG tablet Take 1/2 (one-half) tablet by mouth once daily   torsemide (DEMADEX) 20 MG tablet Take 2 tablets by mouth once daily   triamcinolone ointment (KENALOG) 0.1 % Apply 1 application topically 3 (three) times daily as needed.   [DISCONTINUED] carvedilol (COREG) 12.5 MG tablet Take 1 tablet by mouth twice daily   No facility-administered encounter medications  on file as of 03/31/2020.    Allergies (verified) Bee venom, Amiodarone hcl, Atorvastatin, Benazepril hcl, Clindamycin/lincomycin, Clonidine hydrochloride, Colesevelam, Diltiazem hcl, Ezetimibe, Levaquin [levofloxacin in d5w], Verapamil, and Amoxicillin   History: Past Medical History:  Diagnosis  Date   ANXIETY 04/26/2008   Atrial fibrillation ([place]) 10/31/2010   Coumadin therapy;  Echo 6/12: EF 50-55%, moderate MR, moderate TR, PASP 52, mild LAE   BPH (benign prostatic hypertrophy)    CAP (community acquired pneumonia) 12/2017   CHF (congestive heart failure) ([place])    Essential hypertension, benign 10/03/2007   HTN (hypertension) 06/07/2013   HYPOTHYROIDISM 09/04/2007   LYMPHADENOPATHY 04/06/2009   Nocturia    Pacemaker    Status post AV nodal ablation   TOBACCO USE, QUIT 07/14/2009   Past Surgical History:  Procedure Laterality Date   COLONOSCOPY     EYE SURGERY  2000   eye muscle release   EYE SURGERY     both cataracts   HERNIA REPAIR     INGUINAL HERNIA REPAIR Right 05/06/2013   Procedure: HERNIA REPAIR INGUINAL ADULT;  Surgeon: Haywood Lasso, MD;  Location: [facility];  Service: General;  Laterality: Right;   PACEMAKER INSERTION  10/16/05   PPM GENERATOR CHANGEOUT N/A 10/31/2016   Procedure: PPM Generator Changeout;  Surgeon: Deboraha Sprang, MD;  Location: [facility];  Service: Cardiovascular;  Laterality: N/A;   Family History  Problem Relation Age of Onset   Hypertension Mother    Cancer Mother        uterine/cervical   Heart disease Father    Hypertension Other    Social History   Socioeconomic History   Marital status: Widowed    Spouse name: Not on file   Number of children: 2   Years of education: Not on file   Highest education level: Not on file  Occupational History   Not on file  Tobacco Use   Smoking status: Former Smoker    Packs/day: 1.00    Years: 30.00    Pack years: 30.00    Types: Cigarettes    Quit date: 05/01/1979    Years since quitting: 40.9   Smokeless tobacco: Never Used  Vaping Use   Vaping Use: Never used  Substance and Sexual Activity   Alcohol use: No    Alcohol/week: 0.0 standard drinks   Drug use: No   Sexual activity: Never  Other Topics Concern   Not on file  Social History Narrative    Lost his wife 2002 ; Her health was not good.   Social Determinants of Health   Financial Resource Strain: Low Risk    Difficulty of Paying Living Expenses: Not hard at all  Food Insecurity: No Food Insecurity   Worried About Charity fundraiser in the Last Year: Never true   [place] in the Last Year: Never true  Transportation Needs: No Transportation Needs   Lack of Transportation (Medical): No   Lack of Transportation (Non-Medical): No  Physical Activity: Sufficiently Active   Days of Exercise per Week: 5 days   Minutes of Exercise per Session: 30 min  Stress: No Stress Concern Present   Feeling of Stress : Not at all  Social Connections: Unknown   Frequency of Communication with Friends and Family: More than three times a week   Frequency of Social Gatherings with Friends and Family: More than three times a week   Attends Religious Services: Not on file  Active Member of Clubs or Organizations: Not on file   Attends Club or Organization Meetings: Not on file   Marital Status: Widowed    Tobacco Counseling Counseling given: Not Answered   Clinical Intake:  Pre-visit preparation completed: Yes        Nutritional Risks: None Diabetes: No  How often do you need to have someone help you when you read instructions, pamphlets, or other written materials from your doctor or pharmacy?: 1 - Never What is the last grade level you completed in school?: HSG; 2 years of college  Diabetic? no  Interpreter Needed?: No  Information entered by :: Zinedine Ellner N. Roshelle Traub, LPN   Activities of Daily Living In your present state of health, do you have any difficulty performing the following activities: 03/31/2020  Hearing? N  Vision? N  Difficulty concentrating or making decisions? N  Walking or climbing stairs? N  Dressing or bathing? N  Doing errands, shopping? Y  Preparing Food and eating ? N  Using the Toilet? N  In the past six months, have you accidently leaked  urine? Y  Do you have problems with loss of bowel control? Y  Managing your Medications? N  Managing your Finances? N  Housekeeping or managing your Housekeeping? Y  Comment lives with daughter  Some recent data might be hidden    Patient Care Team: Plotnikov, Evie Lacks, MD as PCP - General Deboraha Sprang, MD as PCP - Cardiology (Cardiology) Deboraha Sprang, MD (Cardiology) Clarene Essex, MD as Consulting Physician (Gastroenterology)  Indicate any recent Medical Services you may have received from other than [facility] providers in the past year (date may be approximate).     Assessment:   This is a routine wellness examination for Mycheal.  Hearing/Vision screen No exam data present  Dietary issues and exercise activities discussed: Current Exercise Habits: Home exercise routine, Type of exercise: walking;Other - see comments (ride bike), Time (Minutes): 30, Frequency (Times/Week): 5, Weekly Exercise (Minutes/Week): 150, Intensity: Mild, Exercise limited by: cardiac condition(s);respiratory conditions(s)  Goals       <enter goal here>      Will increase biking as he can; does not overdo!      Client understands the importance of follow-up with providers by attending scheduled visits (pt-stated)      I will like to keep being physically fit by walking and riding my bike.      Exercise 150 minutes per week (moderate activity)      patient (pt-stated)      Wants to continue current exercise regiment; eat healthy and to maintain his health       Depression Screen PHQ 2/9 Scores 03/31/2020 11/10/2018 08/11/2018 04/29/2017 04/16/2016 04/15/2015 12/23/2014  PHQ - 2 Score 0 0 0 0 0 0 0    Fall Risk Fall Risk  03/31/2020 11/10/2018 08/11/2018 04/29/2017 04/16/2016  Falls in the past year? 0 0 0 No No  Number falls in past yr: 0 0 - - -  Injury with Fall? 0 - - - -  Risk for fall due to : No Fall Risks Impaired balance/gait;Impaired mobility - - -  Follow up Falls evaluation completed Falls  prevention discussed Falls evaluation completed - -    Any stairs in or around the home? Yes  If so, are there any without handrails? No  Home free of loose throw rugs in walkways, pet beds, electrical cords, etc? Yes  Adequate lighting in your home to reduce risk of falls? Yes  ASSISTIVE DEVICES UTILIZED TO PREVENT FALLS:  Life alert? No  Use of a cane, walker or w/c? Yes  Grab bars in the bathroom? Yes  Shower chair or bench in shower? Yes  Elevated toilet seat or a handicapped toilet? Yes   TIMED UP AND GO:  Was the test performed? No .  Length of time to ambulate 10 feet: 0 sec.   Gait slow and steady with assistive device (patient stated that he uses a walker and cane for balance).  Cognitive Function: MMSE - Mini Mental State Exam 03/31/2020 04/16/2016 04/15/2015  Not completed: Unable to complete (No Data) Unable to complete        Immunizations Immunization History  Administered Date(s) Administered   Fluad Quad(high Dose 65+) 05/19/2019   Influenza Split 07/03/2011, 06/25/2012   Influenza Whole 05/31/2010   Influenza, High Dose Seasonal PF 06/10/2015, 05/30/2016, 06/25/2017, 06/13/2018   Influenza,inj,Quad PF,6+ Mos 05/28/2013, 05/04/2014   PFIZER SARS-COV-2 Vaccination 09/28/2019, 10/23/2019   Pneumococcal Conjugate-13 12/30/2013   Pneumococcal Polysaccharide-23 06/19/2006, 04/27/2016   Td 01/10/2010   Tdap 12/28/2016   Zoster 02/12/2007    TDAP status: Up to date Flu Vaccine status: Up to date Pneumococcal vaccine status: Up to date Covid-19 vaccine status: Completed vaccines  Qualifies for Shingles Vaccine? Yes   Zostavax completed Yes   Shingrix Completed?: No.    Education has been provided regarding the importance of this vaccine. Patient has been advised to call insurance company to determine out of pocket expense if they have not yet received this vaccine. Advised may also receive vaccine at local pharmacy or Health Dept. Verbalized acceptance and  understanding.  Screening Tests Health Maintenance  Topic Date Due   INFLUENZA VACCINE  04/17/2020   TETANUS/TDAP  12/29/2026   COVID-19 Vaccine  Completed   PNA vac Low Risk Adult  Completed    Health Maintenance  There are no preventive care reminders to display for this patient.  Colorectal cancer screening: No longer required.   Lung Cancer Screening: (Low Dose CT Chest recommended if Age 40-80 years, 30 pack-year currently smoking OR have quit w/in 15years.) does not qualify.   Lung Cancer Screening Referral: no  Additional Screening:  Hepatitis C Screening: does not qualify; Completed no  Vision Screening: Recommended annual ophthalmology exams for early detection of glaucoma and other disorders of the eye. Is the patient up to date with their annual eye exam?  Yes  Who is the provider or what is the name of the office in which the patient attends annual eye exams? [facility] If pt is not established with a provider, would they like to be referred to a provider to establish care? No .   Dental Screening: Recommended annual dental exams for proper oral hygiene  Community Resource Referral / Chronic Care Management: CRR required this visit?  No   CCM required this visit?  No      Plan:     I have personally reviewed and noted the following in the patients chart:   Medical and social history Use of alcohol, tobacco or illicit drugs  Current medications and supplements Functional ability and status Nutritional status Physical activity Advanced directives List of other physicians Hospitalizations, surgeries, and ER visits in previous 12 months Vitals Screenings to include cognitive, depression, and falls Referrals and appointments  In addition, I have reviewed and discussed with patient certain preventive protocols, quality metrics, and best practice recommendations. A written personalized care plan for preventive services as well as  general  preventive health recommendations were provided to patient.     Sheral Flow, LPN   [id number]   Nurse Notes:  There were no vitals filed for this visit. There is no height or weight on file to calculate BMI. Not able to check cognitive function via virtual visit. Patient stated that he has no issues with concentrating, making decisions or remembering things.  Medical screening examination/treatment/procedure(s) were performed by non-physician practitioner and as supervising physician I was immediately available for consultation/collaboration.  I agree with above. Lew Dawes, MD

## 2020-05-02 ENCOUNTER — Other Ambulatory Visit (INDEPENDENT_AMBULATORY_CARE_PROVIDER_SITE_OTHER): Payer: Medicare HMO

## 2020-05-02 ENCOUNTER — Other Ambulatory Visit: Payer: Self-pay

## 2020-05-02 ENCOUNTER — Encounter: Payer: Self-pay | Admitting: Internal Medicine

## 2020-05-02 ENCOUNTER — Ambulatory Visit (INDEPENDENT_AMBULATORY_CARE_PROVIDER_SITE_OTHER): Payer: Medicare HMO | Admitting: Internal Medicine

## 2020-05-02 VITALS — BP 148/78 | HR 71 | Temp 97.9°F | Ht 69.0 in | Wt 151.0 lb

## 2020-05-02 DIAGNOSIS — I4821 Permanent atrial fibrillation: Secondary | ICD-10-CM

## 2020-05-02 DIAGNOSIS — I10 Essential (primary) hypertension: Secondary | ICD-10-CM | POA: Diagnosis not present

## 2020-05-02 DIAGNOSIS — E039 Hypothyroidism, unspecified: Secondary | ICD-10-CM | POA: Diagnosis not present

## 2020-05-02 DIAGNOSIS — L570 Actinic keratosis: Secondary | ICD-10-CM | POA: Diagnosis not present

## 2020-05-02 DIAGNOSIS — I5032 Chronic diastolic (congestive) heart failure: Secondary | ICD-10-CM

## 2020-05-02 LAB — LIPID PANEL
Cholesterol: 172 mg/dL (ref 0–200)
HDL: 58.5 mg/dL (ref >39.00)
LDL Cholesterol: 101 mg/dL — ABNORMAL HIGH (ref 0–99)
NonHDL: 113.71
Total CHOL/HDL Ratio: 3
Triglycerides: 65 mg/dL (ref 0.0–149.0)
VLDL: 13 mg/dL (ref 0.0–40.0)

## 2020-05-02 LAB — CBC WITH DIFFERENTIAL/PLATELET
Basophils Absolute: 0 10*3/uL (ref 0.0–0.1)
Basophils Relative: 0.4 % (ref 0.0–3.0)
Eosinophils Absolute: 0.1 10*3/uL (ref 0.0–0.7)
Eosinophils Relative: 1.5 % (ref 0.0–5.0)
HCT: 39.2 % (ref 39.0–52.0)
Hemoglobin: 13.3 g/dL (ref 13.0–17.0)
Lymphocytes Relative: 28.9 % (ref 12.0–46.0)
Lymphs Abs: 1.6 10*3/uL (ref 0.7–4.0)
MCHC: 34.1 g/dL (ref 30.0–36.0)
MCV: 100.5 fl — ABNORMAL HIGH (ref 78.0–100.0)
Monocytes Absolute: 0.6 10*3/uL (ref 0.1–1.0)
Monocytes Relative: 10.3 % (ref 3.0–12.0)
Neutro Abs: 3.2 10*3/uL (ref 1.4–7.7)
Neutrophils Relative %: 58.9 % (ref 43.0–77.0)
Platelets: 202 10*3/uL (ref 150.0–400.0)
RBC: 3.9 Mil/uL — ABNORMAL LOW (ref 4.22–5.81)
RDW: 14.1 % (ref 11.5–15.5)
WBC: 5.4 10*3/uL (ref 4.0–10.5)

## 2020-05-02 LAB — COMPREHENSIVE METABOLIC PANEL
ALT: 19 U/L (ref 0–53)
AST: 24 U/L (ref 0–37)
Albumin: 4.1 g/dL (ref 3.5–5.2)
Alkaline Phosphatase: 67 U/L (ref 39–117)
BUN: 40 mg/dL — ABNORMAL HIGH (ref 6–23)
CO2: 32 mEq/L (ref 19–32)
Calcium: 9.5 mg/dL (ref 8.4–10.5)
Chloride: 103 mEq/L (ref 96–112)
Creatinine, Ser: 1.21 mg/dL (ref 0.40–1.50)
GFR: 55.75 mL/min — ABNORMAL LOW (ref >60.00)
Glucose, Bld: 87 mg/dL (ref 70–99)
Potassium: 4.8 mEq/L (ref 3.5–5.1)
Sodium: 143 mEq/L (ref 135–145)
Total Bilirubin: 0.6 mg/dL (ref 0.2–1.2)
Total Protein: 7 g/dL (ref 6.0–8.3)

## 2020-05-02 LAB — TSH: TSH: 0.94 u[IU]/mL (ref 0.35–4.50)

## 2020-05-02 MED ORDER — SPIRONOLACTONE 25 MG PO TABS: ORAL_TABLET | ORAL | 3 refills | Status: DC

## 2020-05-02 MED ORDER — IRBESARTAN 150 MG PO TABS: 150.0000 mg | ORAL_TABLET | Freq: Every day | ORAL | 3 refills | Status: DC

## 2020-05-02 MED ORDER — CARVEDILOL 12.5 MG PO TABS: 12.5000 mg | ORAL_TABLET | Freq: Two times a day (BID) | ORAL | 3 refills | Status: DC

## 2020-05-02 NOTE — Progress Notes (Signed)
Subjective:  Patient ID: Ricky [name], male    DOB: 10-Jan-1923  Age: 86 y.o. MRN: [id number]  CC: No chief complaint on file.   HPI Ricky [name] presents for HTN, hypogonadism, A fib C/o skin lesion on face  Outpatient Medications Prior to Visit  Medication Sig Dispense Refill  . apixaban (ELIQUIS) 5 MG TABS tablet Take 1 tablet (5 mg total) by mouth 2 (two) times daily. 180 tablet 3  . Carboxymethylcellul-Glycerin (LUBRICATING EYE DROPS OP) Apply 1 drop to eye 5 (five) times daily.    . Cholecalciferol 1000 UNITS tablet Take 1,000 Units by mouth daily. Reported on 12/23/2015    . diphenhydrAMINE (BENADRYL) 25 mg capsule Take 1-2 capsules (25-50 mg total) by mouth every 4 (four) hours as needed for itching or allergies (1-2 for bee stings). 60 capsule 2  . levothyroxine (SYNTHROID, LEVOTHROID) 75 MCG tablet Take 75 mcg by mouth daily.    Marland Kitchen torsemide (DEMADEX) 20 MG tablet Take 2 tablets by mouth once daily 180 tablet 0  . carvedilol (COREG) 12.5 MG tablet Take 1 tablet by mouth twice daily 180 tablet 3  . irbesartan (AVAPRO) 150 MG tablet Take 1 tablet (150 mg total) by mouth daily. 90 tablet 3  . spironolactone (ALDACTONE) 25 MG tablet Take 1/2 (one-half) tablet by mouth once daily 45 tablet 0  . Ascorbic Acid (VITAMIN C PO) Take 1 tablet by mouth daily.    Marland Kitchen EPINEPHrine (EPIPEN 2-PAK) 0.3 mg/0.3 mL DEVI Inject 0.3 mg into the muscle daily as needed (allergic reaction). For allergic reaction    . triamcinolone ointment (KENALOG) 0.1 % Apply 1 application topically 3 (three) times daily as needed. 80 g 2   No facility-administered medications prior to visit.    ROS: Review of Systems  Constitutional: Positive for fatigue. Negative for appetite change and unexpected weight change.  HENT: Negative for congestion, nosebleeds, sneezing, sore throat and trouble swallowing.   Eyes: Negative for itching and visual disturbance.  Respiratory: Negative for cough.   Cardiovascular:  Negative for chest pain, palpitations and leg swelling.  Gastrointestinal: Negative for abdominal distention, blood in stool, diarrhea and nausea.  Genitourinary: Negative for frequency and hematuria.  Musculoskeletal: Negative for back pain, gait problem, joint swelling and neck pain.  Skin: Negative for rash.  Neurological: Positive for weakness. Negative for dizziness, tremors and speech difficulty.  Psychiatric/Behavioral: Negative for agitation, dysphoric mood and sleep disturbance. The patient is not nervous/anxious.     Objective:  BP (!) 148/78 (BP Location: Left Arm, Patient Position: Sitting, Cuff Size: Normal)   Pulse 71   Temp 97.9 F (36.6 C) (Oral)   Ht 5\' 9"  (1.753 m)   Wt 151 lb (68.5 kg)   SpO2 99%   BMI 22.30 kg/m   BP Readings from Last 3 Encounters:  05/02/20 (!) 148/78  11/02/19 (!) 154/92  07/10/19 (!) 170/96    Wt Readings from Last 3 Encounters:  05/02/20 151 lb (68.5 kg)  11/02/19 155 lb (70.3 kg)  05/26/19 159 lb (72.1 kg)    Physical Exam Constitutional:      General: He is not in acute distress.    Appearance: He is well-developed.     Comments: NAD  Eyes:     Conjunctiva/sclera: Conjunctivae normal.     Pupils: Pupils are equal, round, and reactive to light.  Neck:     Thyroid: No thyromegaly.     Vascular: No JVD.  Cardiovascular:     Rate and  Rhythm: Normal rate and regular rhythm.     Heart sounds: Normal heart sounds. No murmur heard.  No friction rub. No gallop.   Pulmonary:     Effort: Pulmonary effort is normal. No respiratory distress.     Breath sounds: Normal breath sounds. No wheezing or rales.  Chest:     Chest wall: No tenderness.  Abdominal:     General: Bowel sounds are normal. There is no distension.     Palpations: Abdomen is soft. There is no mass.     Tenderness: There is no abdominal tenderness. There is no guarding or rebound.  Musculoskeletal:        General: No tenderness. Normal range of motion.      Cervical back: Normal range of motion.  Lymphadenopathy:     Cervical: No cervical adenopathy.  Skin:    General: Skin is warm and dry.     Findings: No rash.  Neurological:     Mental Status: He is alert and oriented to person, place, and time.     Cranial Nerves: No cranial nerve deficit.     Motor: No abnormal muscle tone.     Coordination: Coordination abnormal.     Gait: Gait normal.     Deep Tendon Reflexes: Reflexes are normal and symmetric.  Psychiatric:        Behavior: Behavior normal.        Thought Content: Thought content normal.        Judgment: Judgment normal.      Procedure Note :     Procedure : Cryosurgery   Indication:  Wart(s)  Actinic keratosis(es)   Risks including unsuccessful procedure , bleeding, infection, bruising, scar, a need for a repeat  procedure and others were explained to the patient in detail as well as the benefits. Informed consent was obtained verbally.     lesion(s)  on    was/were treated with liquid nitrogen on a Q-tip in a usual fasion . Band-Aid was applied and antibiotic ointment was given for a later use.   Tolerated well. Complications none.   Postprocedure instructions :     Keep the wounds clean. You can wash them with liquid soap and water. Pat dry with gauze or a Kleenex tissue  Before applying antibiotic ointment and a Band-Aid.   You need to report immediately  if  any signs of infection develop.     Lab Results  Component Value Date   WBC 5.8 05/19/2019   HGB 13.0 05/19/2019   HCT 38.8 (L) 05/19/2019   PLT 214.0 05/19/2019   GLUCOSE 96 11/02/2019   CHOL 198 05/19/2019   TRIG 104.0 05/19/2019   HDL 55.40 05/19/2019   LDLCALC 122 (H) 05/19/2019   ALT 16 05/19/2019   AST 21 05/19/2019   NA 141 11/02/2019   K 5.1 11/02/2019   CL 103 11/02/2019   CREATININE 1.12 11/02/2019   BUN 33 (H) 11/02/2019   CO2 32 11/02/2019   TSH 1.48 05/19/2019   PSA 0.84 12/28/2013   INR 1.6 (H) 10/17/2016    No results  found.  Assessment & Plan:     Meds ordered this encounter  Medications  . irbesartan (AVAPRO) 150 MG tablet    Sig: Take 1 tablet (150 mg total) by mouth daily.    Dispense:  90 tablet    Refill:  3  . spironolactone (ALDACTONE) 25 MG tablet    Sig: Take 1/2 (one-half) tablet by mouth once daily  Dispense:  45 tablet    Refill:  3  . carvedilol (COREG) 12.5 MG tablet    Sig: Take 1 tablet (12.5 mg total) by mouth 2 (two) times daily.    Dispense:  180 tablet    Refill:  3     Follow-up: No follow-ups on file.  Walker Kehr, MD

## 2020-05-02 NOTE — Assessment & Plan Note (Addendum)
BP Readings from Last 3 Encounters:  05/02/20 (!) 148/78  11/02/19 (!) 154/92  07/10/19 (!) 170/96  Coreg, Demadex, Irbesartan

## 2020-05-02 NOTE — Assessment & Plan Note (Signed)
See Cryo 

## 2020-05-02 NOTE — Assessment & Plan Note (Signed)
Eliquis 

## 2020-05-02 NOTE — Patient Instructions (Signed)
   Postprocedure instructions :     Keep the wounds clean. You can wash them with liquid soap and water. Pat dry with gauze or a Kleenex tissue  Before applying antibiotic ointment and a Band-Aid.   You need to report immediately  if  any signs of infection develop.    

## 2020-05-05 ENCOUNTER — Other Ambulatory Visit (INDEPENDENT_AMBULATORY_CARE_PROVIDER_SITE_OTHER): Payer: Medicare HMO

## 2020-05-05 DIAGNOSIS — I4821 Permanent atrial fibrillation: Secondary | ICD-10-CM | POA: Diagnosis not present

## 2020-05-05 LAB — URINALYSIS, ROUTINE W REFLEX MICROSCOPIC
Bilirubin Urine: NEGATIVE
Hgb urine dipstick: NEGATIVE
Ketones, ur: NEGATIVE
Leukocytes,Ua: NEGATIVE
Nitrite: POSITIVE — AB
RBC / HPF: NONE SEEN (ref 0–?)
Specific Gravity, Urine: 1.015 (ref 1.000–1.030)
Total Protein, Urine: NEGATIVE
Urine Glucose: NEGATIVE
Urobilinogen, UA: 0.2 (ref 0.0–1.0)
pH: 5.5 (ref 5.0–8.0)

## 2020-06-04 ENCOUNTER — Other Ambulatory Visit: Payer: Self-pay | Admitting: Internal Medicine

## 2020-06-15 ENCOUNTER — Encounter: Payer: Self-pay | Admitting: Internal Medicine

## 2020-06-15 ENCOUNTER — Telehealth (INDEPENDENT_AMBULATORY_CARE_PROVIDER_SITE_OTHER): Payer: Medicare HMO | Admitting: Internal Medicine

## 2020-06-15 DIAGNOSIS — R32 Unspecified urinary incontinence: Secondary | ICD-10-CM | POA: Diagnosis not present

## 2020-06-15 DIAGNOSIS — J0100 Acute maxillary sinusitis, unspecified: Secondary | ICD-10-CM

## 2020-06-15 DIAGNOSIS — J019 Acute sinusitis, unspecified: Secondary | ICD-10-CM | POA: Insufficient documentation

## 2020-06-15 DIAGNOSIS — E039 Hypothyroidism, unspecified: Secondary | ICD-10-CM | POA: Diagnosis not present

## 2020-06-15 DIAGNOSIS — I509 Heart failure, unspecified: Secondary | ICD-10-CM | POA: Diagnosis not present

## 2020-06-15 DIAGNOSIS — Z7901 Long term (current) use of anticoagulants: Secondary | ICD-10-CM | POA: Diagnosis not present

## 2020-06-15 DIAGNOSIS — L309 Dermatitis, unspecified: Secondary | ICD-10-CM | POA: Diagnosis not present

## 2020-06-15 DIAGNOSIS — D6869 Other thrombophilia: Secondary | ICD-10-CM | POA: Diagnosis not present

## 2020-06-15 DIAGNOSIS — E261 Secondary hyperaldosteronism: Secondary | ICD-10-CM | POA: Diagnosis not present

## 2020-06-15 DIAGNOSIS — I4891 Unspecified atrial fibrillation: Secondary | ICD-10-CM | POA: Diagnosis not present

## 2020-06-15 DIAGNOSIS — M810 Age-related osteoporosis without current pathological fracture: Secondary | ICD-10-CM | POA: Diagnosis not present

## 2020-06-15 DIAGNOSIS — I110 Hypertensive heart disease with heart failure: Secondary | ICD-10-CM | POA: Diagnosis not present

## 2020-06-15 MED ORDER — CEFDINIR 300 MG PO CAPS
300.0000 mg | ORAL_CAPSULE | Freq: Two times a day (BID) | ORAL | 0 refills | Status: DC
Start: 2020-06-15 — End: 2020-09-21

## 2020-06-15 NOTE — Assessment & Plan Note (Signed)
Cefdinir po RTC if not well in 7-10 d

## 2020-06-15 NOTE — Progress Notes (Signed)
Virtual Visit via Telephone Note  I connected with Ricky [name] on 06/15/20 at 11:00 AM EDT by telephone and verified that I am speaking with the correct person using two identifiers.   I discussed the limitations, risks, security and privacy concerns of performing an evaluation and management service by telephone and the availability of in person appointments. I also discussed with the patient that there may be a patient responsible charge related to this service. The patient expressed understanding and agreed to proceed.   History of Present Illness:   C/o white, yellow, red clumps mucus - nasal d/c x days. C/o sinus pain.  Observations/Objective:  Pt sounds normal. NAD Assessment and Plan:  See plan Follow Up Instructions:    I discussed the assessment and treatment plan with the patient. The patient was provided an opportunity to ask questions and all were answered. The patient agreed with the plan and demonstrated an understanding of the instructions.   The patient was advised to call back or seek an in-person evaluation if the symptoms worsen or if the condition fails to improve as anticipated.  I provided 15 minutes of non-face-to-face time during this encounter.   Walker Kehr, MD

## 2020-07-08 ENCOUNTER — Ambulatory Visit: Payer: Medicare HMO

## 2020-07-13 DIAGNOSIS — R69 Illness, unspecified: Secondary | ICD-10-CM | POA: Diagnosis not present

## 2020-07-16 ENCOUNTER — Ambulatory Visit: Payer: Medicare HMO

## 2020-07-20 ENCOUNTER — Telehealth: Payer: Self-pay

## 2020-07-20 NOTE — Telephone Encounter (Signed)
Spoke with patient tried to send a transmission as he missed his in October 2021. Unable to send transmission even with help of tech support and they are going to send him a new monitor and if that doesn't work patient will have to come in to get checked they believe the rf telemetry is disabled. Patient will call us when he receives the monitor in the mail

## 2020-09-21 ENCOUNTER — Encounter: Payer: Self-pay | Admitting: Family

## 2020-09-21 ENCOUNTER — Ambulatory Visit (INDEPENDENT_AMBULATORY_CARE_PROVIDER_SITE_OTHER): Payer: Medicare HMO | Admitting: Family Medicine

## 2020-09-21 ENCOUNTER — Ambulatory Visit: Payer: Self-pay

## 2020-09-21 ENCOUNTER — Other Ambulatory Visit: Payer: Self-pay

## 2020-09-21 ENCOUNTER — Ambulatory Visit (INDEPENDENT_AMBULATORY_CARE_PROVIDER_SITE_OTHER): Payer: Medicare HMO

## 2020-09-21 ENCOUNTER — Ambulatory Visit (INDEPENDENT_AMBULATORY_CARE_PROVIDER_SITE_OTHER): Payer: Medicare HMO | Admitting: Family

## 2020-09-21 ENCOUNTER — Encounter: Payer: Self-pay | Admitting: Family Medicine

## 2020-09-21 VITALS — BP 148/80 | Ht 69.0 in | Wt 152.0 lb

## 2020-09-21 VITALS — BP 148/80 | Temp 97.8°F | Ht 69.0 in | Wt 152.0 lb

## 2020-09-21 DIAGNOSIS — S8001XA Contusion of right knee, initial encounter: Secondary | ICD-10-CM | POA: Diagnosis not present

## 2020-09-21 DIAGNOSIS — M25561 Pain in right knee: Secondary | ICD-10-CM | POA: Diagnosis not present

## 2020-09-21 NOTE — Progress Notes (Signed)
    Subjective:    CC: R knee pain and swelling  I, Molly Weber, LAT, ATC, am serving as scribe for Dr. Clementeen Graham.  HPI: Pt is a 88 y/o male presenting w/ c/o R knee pain and swelling x approximately 2 weeks when he fell while he was making his bed.  He did not have any immediate pain or swelling.  These did not begin until approximately 2 days after his fall.  R knee swelling: yes R knee mechanical symptoms: no Aggravating factors: walking/weight bearing; R knee AROM into both extension and flexion Treatments tried: ice; Tylenol  Pertinent review of Systems: No fevers or chills  Relevant historical information: Atrial fibrillation, hypertension, heart failure. Long-term anticoagulant therapy.   Objective:    Vitals:   09/21/20 1219  BP: (!) 148/80   General: Well Developed, well nourished, and in no acute distress.   MSK: Right knee: Swollen and erythema over the anterior medial knee.  Skin not hot to touch.  Not particularly tender to touch. Intact motion.  Intact strength extension and flexion. Stable ligamentous exam.  Lab and Radiology Results  X-ray images right knee obtained today personally and independently interpreted Degenerative changes.  No acute fractures. Await formal radiology review  Diagnostic Limited MSK Ultrasound of: Right knee Quad tendon intact normal-appearing Small joint effusion. Patellar tendon intact. Large hypoechoic hematoma overlying anterior medial knee. Lateral joint line degenerative otherwise normal. Medial joint line degenerative otherwise normal-appearing Impression: Large hematoma    Impression and Recommendations:    Assessment and Plan: 87 y.o. male with right knee pain after fall due to large hematoma.  Patient is on Eliquis which is a risk factor for developing this injury after a fall.  Plan for conservative management.  Recommend compression, Tylenol arthritis, and Voltaren gel.  Recheck in about 2 weeks.   Discussed precautions.  Consider referral to home health PT. Brett Canales is doing well enough that he would like to avoid that for now.    PDMP not reviewed this encounter. Orders Placed This Encounter  Procedures  . [place] LIMITED JOINT SPACE STRUCTURES LOW RIGHT(NO LINKED CHARGES)    Order Specific Question:   Reason for Exam (SYMPTOM  OR DIAGNOSIS REQUIRED)    Answer:   R knee pain    Order Specific Question:   Preferred imaging location?    Answer:   Adult nurse [facility]  . DG Knee Complete 4 Views Right    Standing Status:   Future    Number of Occurrences:   1    Standing Expiration Date:   09/21/2021    Order Specific Question:   Reason for Exam (SYMPTOM  OR DIAGNOSIS REQUIRED)    Answer:   trauma series. KNee pain after fall    Order Specific Question:   Preferred imaging location?    Answer:   [facility]   No orders of the defined types were placed in this encounter.   Discussed warning signs or symptoms. Please see discharge instructions. Patient expresses understanding.   The above documentation has been reviewed and is accurate and complete Clementeen Graham, M.D.

## 2020-09-21 NOTE — Patient Instructions (Signed)
Thank you for coming in today.  Use extra strength tylenol  Use compression.   Recheck with me in about 2 weeks.   If you get a lot better and dont want to come back that's ok.
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
## 2020-09-21 NOTE — Progress Notes (Signed)
X-ray knee shows some arthritis but no fractures.

## 2020-09-26 ENCOUNTER — Ambulatory Visit (INDEPENDENT_AMBULATORY_CARE_PROVIDER_SITE_OTHER): Payer: Medicare HMO

## 2020-09-26 DIAGNOSIS — I442 Atrioventricular block, complete: Secondary | ICD-10-CM | POA: Diagnosis not present

## 2020-09-26 LAB — CUP PACEART REMOTE DEVICE CHECK
Battery Remaining Longevity: 77 mo
Battery Remaining Percentage: 89 %
Battery Voltage: 2.95 V
Brady Statistic AP VP Percent: 0 %
Brady Statistic AP VS Percent: 0 %
Brady Statistic AS VP Percent: 0 %
Brady Statistic AS VS Percent: 0 %
Brady Statistic RA Percent Paced: 0 %
Brady Statistic RV Percent Paced: 99 %
Date Time Interrogation Session: 20220110094012
Implantable Lead Implant Date: 19980824
Implantable Lead Implant Date: 19980824
Implantable Lead Location: 753859
Implantable Lead Location: 753860
Implantable Pulse Generator Implant Date: 20180214
Lead Channel Impedance Value: 330 Ohm
Lead Channel Impedance Value: 380 Ohm
Lead Channel Pacing Threshold Amplitude: 0.75 V
Lead Channel Pacing Threshold Amplitude: 1 V
Lead Channel Pacing Threshold Pulse Width: 0.4 ms
Lead Channel Pacing Threshold Pulse Width: 0.8 ms
Lead Channel Sensing Intrinsic Amplitude: 3.1 mV
Lead Channel Sensing Intrinsic Amplitude: 7.2 mV
Lead Channel Setting Pacing Amplitude: 2 V
Lead Channel Setting Pacing Amplitude: 2.5 V
Lead Channel Setting Pacing Pulse Width: 0.8 ms
Lead Channel Setting Sensing Sensitivity: 6 mV
Pulse Gen Model: 2272
Pulse Gen Serial Number: 7998385

## 2020-10-04 NOTE — Progress Notes (Signed)
   I, Wendy Poet, LAT, ATC, am serving as scribe for Dr. Lynne Leader.  Ricky [name] is a 87 y.o. male who presents to [place] [facility] today for f/u R knee large traumatic hematoma approximately 3 weeks ago that he sustained from a fall while making his bed. Pt was last seen by Dr. Georgina Snell on 09/21/20 and was advised to try compression, Tylenol arthritis, and Voltaren gel. Today, pt reports that his R knee is feeling better but con't to be swollen.  He has been using the Voltaren gel 2-3 x/day but has not been as consistent w/ the compression wrap  He states that the pain isn't too bad at this point and notes that his biggest issue is w/ his balance due to the swelling in his knee.   Dx imaging: 09/21/20 R knee XR   Pertinent review of systems: No fevers or chills  Relevant historical information: Hypertension, PVC.   Exam:  BP 120/72 (BP Location: Left Arm, Patient Position: Sitting, Cuff Size: Normal)   Ht 5\' 9"  (1.753 m)   Wt 154 lb 3.2 oz (69.9 kg)   BMI 22.77 kg/m  General: Well Developed, well nourished, and in no acute distress.   MSK: Right knee moderate sized seroma anterior superior medial knee.  No erythema.  Normal knee motion.  Nontender.    Assessment and Plan: 87 y.o. male with right knee traumatic hematoma.  Now developing into a seroma.  Appears improved from last visit.  The size and appearance of the hematoma is smaller and less erythematous than last visit.  Since it is improving I am hopeful that with a bit more conservative time and management it will get better on its own and not require aspiration.  Plan to rule emphasize compression and Voltaren gel.  Recheck in 1 month.  If not better at that point would consider aspiration.  Discussed treatment plan and options.   PDMP not reviewed this encounter. No orders of the defined types were placed in this encounter.  No orders of the defined types were placed in this  encounter.    Discussed warning signs or symptoms. Please see discharge instructions. Patient expresses understanding.   The above documentation has been reviewed and is accurate and complete Lynne Leader, M.D.

## 2020-10-05 ENCOUNTER — Ambulatory Visit (INDEPENDENT_AMBULATORY_CARE_PROVIDER_SITE_OTHER): Payer: Medicare HMO | Admitting: Family Medicine

## 2020-10-05 ENCOUNTER — Other Ambulatory Visit: Payer: Self-pay

## 2020-10-05 ENCOUNTER — Ambulatory Visit: Payer: Self-pay

## 2020-10-05 ENCOUNTER — Encounter: Payer: Self-pay | Admitting: Family Medicine

## 2020-10-05 VITALS — BP 120/72 | Ht 69.0 in | Wt 154.2 lb

## 2020-10-05 DIAGNOSIS — S8001XD Contusion of right knee, subsequent encounter: Secondary | ICD-10-CM

## 2020-10-05 NOTE — Patient Instructions (Signed)
Thank you for coming in today.  Lets try giving it another month to get better.   Try using a compression sleeve on the knee.  A simple over the counter sleeve at a sporting goods store or even at a medical supply company will have one.   I like body helix's knee sleeve.  I recommend you obtained a compression sleeve to help with your joint problems. There are many options on the market however I recommend obtaining a full knee Body Helix compression sleeve.  You can find information (including how to appropriate measure yourself for sizing) can be found at www.Body http://www.lambert.com/.  Many of these products are health savings account (HSA) eligible.   You can use the compression sleeve at any time throughout the day but is most important to use while being active as well as for 2 hours post-activity.   It is appropriate to ice following activity with the compression sleeve in place.   Ok to continue the voltaren gel.    Seroma A seroma is a collection of fluid on the body that looks like swelling or a mass. Seromas form where tissue has been injured or cut. Seromas vary in size. Some are small and painless. Others may become large and cause pain or discomfort. Many seromas go away on their own as the fluid is naturally absorbed by the body, and some seromas need to be drained. What are the causes? Seromas form as the result of damage to tissue or the removal of tissue. This tissue damage may happen during surgery or because of an injury or trauma. When tissue is disrupted or removed, empty space is created. The body's natural defense system (immune system) causes fluid to enter the empty space and form a seroma. What are the signs or symptoms? Symptoms of this condition include:  Swelling at the site of a surgical incision or an injury.  Drainage of clear fluid at the surgery or injury site.  Discomfort or pain. How is this diagnosed? This condition is diagnosed based on:  Your  symptoms.  Your medical history.  A physical exam. During the exam, your health care provider will press on the seroma. You may also have tests, including:  Blood tests.  Imaging tests, such as an ultrasound or a CT scan. How is this treated? Some seromas go away on their own. Your health care provider may monitor you to make sure the seroma does not cause any problems. If your seroma does not go away on its own, treatment may include:  Using a needle to drain the fluid from the seroma (needle aspiration).  Inserting a small, thin tube (catheter) to drain the fluid.  Applying a bandage (dressing), such as an elastic bandage or binder.  Taking antibiotic medicines, if the seroma becomes infected. In rare cases, surgery may be done to remove the seroma and repair the area. Follow these instructions at home:  If you were prescribed an antibiotic medicine, take it as told by your health care provider. Do not stop using the antibiotic even if you start to feel better.  Take over-the-counter and prescription medicines only as told by your health care provider.  Return to your normal activities as told by your health care provider. Ask your health care provider what activities are safe for you.  Check your seroma every day for signs of infection. Check for: ? Redness or pain. ? More swelling. ? More fluid. ? Warmth. ? Pus or a bad smell.  Keep  all follow-up visits as told by your health care provider. This is important.   Contact a health care provider if:  You have a fever.  You have redness or pain at the site of the seroma.  Your seroma is more swollen or is getting bigger.  You have more fluid coming from the seroma.  Your seroma feels warm to the touch.  You have pus or a bad smell coming from the seroma. Get help right away if:  You have a fever along with severe pain, redness at the site, or chills.  You feel confused or have trouble staying awake.  You feel  like your heart is beating very fast.  You feel short of breath or are breathing rapidly.  You have cool, clammy, or sweaty skin. Summary  Seromas can form as a result of injury or surgery on tissue.  Seromas can cause swelling, drainage of clear fluid, and discomfort or pain at the site of the tissue injury.  Some seromas go away on their own. Other seromas may need to be drained.  Check your seroma every day for signs of infection, such as pain, more swelling, more fluid, warmth, or pus or a bad smell. This information is not intended to replace advice given to you by your health care provider. Make sure you discuss any questions you have with your health care provider. Document Revised: 07/27/2019 Document Reviewed: 07/27/2019 Elsevier Patient Education  2021 [employer].

## 2020-10-10 NOTE — Progress Notes (Signed)
Remote pacemaker transmission.   

## 2020-11-02 ENCOUNTER — Other Ambulatory Visit: Payer: Self-pay

## 2020-11-02 ENCOUNTER — Ambulatory Visit (INDEPENDENT_AMBULATORY_CARE_PROVIDER_SITE_OTHER): Payer: Medicare HMO | Admitting: Internal Medicine

## 2020-11-02 ENCOUNTER — Encounter: Payer: Self-pay | Admitting: Internal Medicine

## 2020-11-02 VITALS — BP 128/70 | HR 68 | Temp 97.8°F | Ht 69.0 in | Wt 150.6 lb

## 2020-11-02 DIAGNOSIS — I5032 Chronic diastolic (congestive) heart failure: Secondary | ICD-10-CM | POA: Diagnosis not present

## 2020-11-02 DIAGNOSIS — T466X5A Adverse effect of antihyperlipidemic and antiarteriosclerotic drugs, initial encounter: Secondary | ICD-10-CM

## 2020-11-02 DIAGNOSIS — M25561 Pain in right knee: Secondary | ICD-10-CM | POA: Diagnosis not present

## 2020-11-02 DIAGNOSIS — G720 Drug-induced myopathy: Secondary | ICD-10-CM | POA: Insufficient documentation

## 2020-11-02 DIAGNOSIS — G8929 Other chronic pain: Secondary | ICD-10-CM

## 2020-11-02 DIAGNOSIS — R270 Ataxia, unspecified: Secondary | ICD-10-CM

## 2020-11-02 DIAGNOSIS — E039 Hypothyroidism, unspecified: Secondary | ICD-10-CM | POA: Diagnosis not present

## 2020-11-02 LAB — COMPREHENSIVE METABOLIC PANEL
ALT: 33 U/L (ref 0–53)
AST: 31 U/L (ref 0–37)
Albumin: 4 g/dL (ref 3.5–5.2)
Alkaline Phosphatase: 66 U/L (ref 39–117)
BUN: 48 mg/dL — ABNORMAL HIGH (ref 6–23)
CO2: 34 mEq/L — ABNORMAL HIGH (ref 19–32)
Calcium: 9.2 mg/dL (ref 8.4–10.5)
Chloride: 101 mEq/L (ref 96–112)
Creatinine, Ser: 1.24 mg/dL (ref 0.40–1.50)
GFR: 49.5 mL/min — ABNORMAL LOW (ref >60.00)
Glucose, Bld: 64 mg/dL — ABNORMAL LOW (ref 70–99)
Potassium: 4.8 mEq/L (ref 3.5–5.1)
Sodium: 140 mEq/L (ref 135–145)
Total Bilirubin: 0.6 mg/dL (ref 0.2–1.2)
Total Protein: 6.9 g/dL (ref 6.0–8.3)

## 2020-11-02 LAB — CBC WITH DIFFERENTIAL/PLATELET
Basophils Absolute: 0 10*3/uL (ref 0.0–0.1)
Basophils Relative: 0.3 % (ref 0.0–3.0)
Eosinophils Absolute: 0.1 10*3/uL (ref 0.0–0.7)
Eosinophils Relative: 1.2 % (ref 0.0–5.0)
HCT: 40.5 % (ref 39.0–52.0)
Hemoglobin: 13.3 g/dL (ref 13.0–17.0)
Lymphocytes Relative: 24.4 % (ref 12.0–46.0)
Lymphs Abs: 1.4 10*3/uL (ref 0.7–4.0)
MCHC: 33 g/dL (ref 30.0–36.0)
MCV: 105 fl — ABNORMAL HIGH (ref 78.0–100.0)
Monocytes Absolute: 0.6 10*3/uL (ref 0.1–1.0)
Monocytes Relative: 10.3 % (ref 3.0–12.0)
Neutro Abs: 3.7 10*3/uL (ref 1.4–7.7)
Neutrophils Relative %: 63.8 % (ref 43.0–77.0)
Platelets: 194 10*3/uL (ref 150.0–400.0)
RBC: 3.85 Mil/uL — ABNORMAL LOW (ref 4.22–5.81)
RDW: 14 % (ref 11.5–15.5)
WBC: 5.9 10*3/uL (ref 4.0–10.5)

## 2020-11-02 LAB — TSH: TSH: 0.85 u[IU]/mL (ref 0.35–4.50)

## 2020-11-02 NOTE — Assessment & Plan Note (Signed)
Ricky [name] fell in Dec 2021 - seeing Dr Georgina Snell -  Knee hematoma Sleeve

## 2020-11-02 NOTE — Progress Notes (Signed)
Subjective:  Patient ID: Ricky [name], male    DOB: Aug 22, 1923  Age: 87 y.o. MRN: [id number]  CC: Annual Exam   HPI Ricky [name] presents for OA, hypothyroidism, CHF f/u C/o R knee pain - better; he fell in Dec 2021 - seeing Dr Georgina Snell  Outpatient Medications Prior to Visit  Medication Sig Dispense Refill  . apixaban (ELIQUIS) 5 MG TABS tablet Take 1 tablet (5 mg total) by mouth 2 (two) times daily. 180 tablet 3  . Carboxymethylcellul-Glycerin (LUBRICATING EYE DROPS OP) Apply 1 drop to eye 5 (five) times daily.    . carvedilol (COREG) 12.5 MG tablet Take 1 tablet (12.5 mg total) by mouth 2 (two) times daily. 180 tablet 3  . Cholecalciferol 1000 UNITS tablet Take 1,000 Units by mouth daily. Reported on 12/23/2015    . irbesartan (AVAPRO) 150 MG tablet Take 1 tablet (150 mg total) by mouth daily. 90 tablet 3  . levothyroxine (SYNTHROID, LEVOTHROID) 75 MCG tablet Take 75 mcg by mouth daily.    Marland Kitchen spironolactone (ALDACTONE) 25 MG tablet Take 1/2 (one-half) tablet by mouth once daily 45 tablet 3  . torsemide (DEMADEX) 20 MG tablet Take 2 tablets by mouth once daily 180 tablet 3  . diphenhydrAMINE (BENADRYL) 25 mg capsule Take 1-2 capsules (25-50 mg total) by mouth every 4 (four) hours as needed for itching or allergies (1-2 for bee stings). (Patient not taking: Reported on 11/02/2020) 60 capsule 2   No facility-administered medications prior to visit.    ROS: Review of Systems  Constitutional: Positive for fatigue. Negative for appetite change and unexpected weight change.  HENT: Negative for congestion, nosebleeds, sneezing, sore throat and trouble swallowing.   Eyes: Negative for itching and visual disturbance.  Respiratory: Negative for cough.   Cardiovascular: Negative for chest pain, palpitations and leg swelling.  Gastrointestinal: Negative for abdominal distention, blood in stool, diarrhea and nausea.  Genitourinary: Negative for frequency and hematuria.  Musculoskeletal:  Positive for arthralgias and gait problem. Negative for back pain, joint swelling and neck pain.  Skin: Negative for rash.  Neurological: Negative for dizziness, tremors, speech difficulty and weakness.  Psychiatric/Behavioral: Negative for agitation, dysphoric mood and sleep disturbance. The patient is not nervous/anxious.     Objective:  BP 128/70 (BP Location: Left Arm)   Pulse 68   Temp 97.8 F (36.6 C) (Oral)   Ht 5\' 9"  (1.753 m)   Wt 150 lb 9.6 oz (68.3 kg)   SpO2 98%   BMI 22.24 kg/m   BP Readings from Last 3 Encounters:  11/02/20 128/70  10/05/20 120/72  09/21/20 (!) 148/80    Wt Readings from Last 3 Encounters:  11/02/20 150 lb 9.6 oz (68.3 kg)  10/05/20 154 lb 3.2 oz (69.9 kg)  09/21/20 152 lb (68.9 kg)    Physical Exam Constitutional:      General: He is not in acute distress.    Appearance: He is well-developed.     Comments: NAD  HENT:     Mouth/Throat:     Mouth: Oropharynx is clear and moist.  Eyes:     Conjunctiva/sclera: Conjunctivae normal.     Pupils: Pupils are equal, round, and reactive to light.  Neck:     Thyroid: No thyromegaly.     Vascular: No JVD.  Cardiovascular:     Rate and Rhythm: Normal rate. Rhythm irregular.     Pulses: Intact distal pulses.     Heart sounds: Normal heart sounds. No murmur heard. No  friction rub. No gallop.   Pulmonary:     Effort: Pulmonary effort is normal. No respiratory distress.     Breath sounds: Normal breath sounds. No wheezing or rales.  Chest:     Chest wall: No tenderness.  Abdominal:     General: Bowel sounds are normal. There is no distension.     Palpations: Abdomen is soft. There is no mass.     Tenderness: There is no abdominal tenderness. There is no guarding or rebound.  Musculoskeletal:        General: Tenderness present. Normal range of motion.     Cervical back: Normal range of motion.     Right lower leg: Edema present.     Left lower leg: Edema present.  Lymphadenopathy:      Cervical: No cervical adenopathy.  Skin:    General: Skin is warm and dry.     Findings: No rash.  Neurological:     Mental Status: He is alert and oriented to person, place, and time.     Cranial Nerves: No cranial nerve deficit.     Motor: No abnormal muscle tone.     Coordination: He displays a negative Romberg sign. Coordination abnormal.     Gait: Gait abnormal.     Deep Tendon Reflexes: Reflexes are normal and symmetric.  Psychiatric:        Mood and Affect: Mood and affect normal.        Behavior: Behavior normal.        Thought Content: Thought content normal.        Judgment: Judgment normal.    R knee in a sleeve  Using a walker  Trace edema B ankles   Lab Results  Component Value Date   WBC 5.4 05/02/2020   HGB 13.3 05/02/2020   HCT 39.2 05/02/2020   PLT 202.0 05/02/2020   GLUCOSE 87 05/02/2020   CHOL 172 05/02/2020   TRIG 65.0 05/02/2020   HDL 58.50 05/02/2020   LDLCALC 101 (H) 05/02/2020   ALT 19 05/02/2020   AST 24 05/02/2020   NA 143 05/02/2020   K 4.8 05/02/2020   CL 103 05/02/2020   CREATININE 1.21 05/02/2020   BUN 40 (H) 05/02/2020   CO2 32 05/02/2020   TSH 0.94 05/02/2020   PSA 0.84 12/28/2013   INR 1.6 (H) 10/17/2016    No results found.  Assessment & Plan:    Walker Kehr, MD

## 2020-11-02 NOTE — Assessment & Plan Note (Signed)
On Torsemide, Spironolactone

## 2020-11-02 NOTE — Assessment & Plan Note (Signed)
R knee pain - better; he fell in Dec 2021 - seeing Dr Georgina Snell - hematoma Sleeve

## 2020-11-02 NOTE — Addendum Note (Signed)
Addended by: Raliegh Ip on: 11/02/2020 12:10 PM   Modules accepted: Orders

## 2020-11-07 ENCOUNTER — Ambulatory Visit: Payer: Medicare HMO | Admitting: Family Medicine

## 2020-11-08 NOTE — Progress Notes (Signed)
   I, Peterson Lombard, LAT, ATC acting as a scribe for Lynne Leader, MD.  LESSLIE MCKEEHAN is a 87 y.o. male who presents to [place] at [facility] today for f/u R knee pain due to a traumatic hematoma, that was progressing into a seroma, that he sustained from a fall while making his bed. Pt was last seen by Dr. Georgina Snell on 10/05/20 and an improvement was seen. Pt was advised to continue conservative management and emphasized compression and Voltaren gel. Today, pt reports R knee is still swollen, but is somewhat better than at his last visit. Pt has been compliant in wearing the knee compression sleeve.  Dx imaging: 09/21/20 R knee XR  Pertinent review of systems: No fevers or chills  Relevant historical information: Heart failure   Exam:  BP 126/84 (BP Location: Right Arm, Patient Position: Sitting, Cuff Size: Normal)   Ht 5\' 9"  (1.753 m)   Wt 149 lb 3.2 oz (67.7 kg)   BMI 22.03 kg/m  General: Well Developed, well nourished, and in no acute distress.   MSK: Right knee nodule anterior medial knee.  Nonedematous nontender.  Normal knee motion.    Lab and Radiology Results  Diagnostic Limited MSK Ultrasound of: Anterior medial knee Heterogeneous appearance structure anterior medial knee superficial to bone consistent with fibrous hematoma.  No seroma present. Impression: Fibrous scar versus hematoma anterior medial knee     Assessment and Plan: 87 y.o. male with hematoma anterior medial knee.  This has not resolved to a seroma at this point.  It is significantly smaller than it was previously but still present.  Clinically he is doing well.  I think we can continue to be patient with this and continue normal activity and depression.  Recheck in 3 months.  If not better may consider surgery.  At this point this point the hematoma is not able to be aspirated.  This would require surgical removal which is obviously not something that [name] is willing to consider at this  time.   PDMP not reviewed this encounter. Orders Placed This Encounter  Procedures  . [place] LIMITED JOINT SPACE STRUCTURES LOW RIGHT(NO LINKED CHARGES)    Standing Status:   Future    Number of Occurrences:   1    Standing Expiration Date:   05/09/2021    Order Specific Question:   Reason for Exam (SYMPTOM  OR DIAGNOSIS REQUIRED)    Answer:   right knee pain    Order Specific Question:   Preferred imaging location?    Answer:   [place]   No orders of the defined types were placed in this encounter.    Discussed warning signs or symptoms. Please see discharge instructions. Patient expresses understanding.   The above documentation has been reviewed and is accurate and complete Lynne Leader, M.D.

## 2020-11-09 ENCOUNTER — Ambulatory Visit (INDEPENDENT_AMBULATORY_CARE_PROVIDER_SITE_OTHER): Payer: Medicare HMO | Admitting: Family Medicine

## 2020-11-09 ENCOUNTER — Ambulatory Visit: Payer: Self-pay

## 2020-11-09 ENCOUNTER — Telehealth: Payer: Self-pay | Admitting: Internal Medicine

## 2020-11-09 ENCOUNTER — Other Ambulatory Visit: Payer: Self-pay

## 2020-11-09 VITALS — BP 126/84 | Ht 69.0 in | Wt 149.2 lb

## 2020-11-09 DIAGNOSIS — S8001XD Contusion of right knee, subsequent encounter: Secondary | ICD-10-CM

## 2020-11-09 NOTE — Telephone Encounter (Signed)
Printed labs and left up front for pick-up.Marland KitchenJohny [name]

## 2020-11-09 NOTE — Telephone Encounter (Signed)
Patient is going to see Dr. Georgina Snell today at 12:30 and is wondering if we can print out a copy of his recent lab results for him to pick up in our office today

## 2020-11-09 NOTE — Patient Instructions (Signed)
Thank you for coming in today.  Continue compression.   Resume normal activity.   Recheck in 3 months.   Let me know sooner if this bothers you.   I think this  Will go away.

## 2020-12-26 ENCOUNTER — Ambulatory Visit (INDEPENDENT_AMBULATORY_CARE_PROVIDER_SITE_OTHER): Payer: Medicare HMO

## 2020-12-26 DIAGNOSIS — I442 Atrioventricular block, complete: Secondary | ICD-10-CM

## 2020-12-27 LAB — CUP PACEART REMOTE DEVICE CHECK
Battery Remaining Longevity: 79 mo
Battery Remaining Percentage: 89 %
Battery Voltage: 2.95 V
Brady Statistic AP VP Percent: 0 %
Brady Statistic AP VS Percent: 0 %
Brady Statistic AS VP Percent: 0 %
Brady Statistic AS VS Percent: 0 %
Brady Statistic RA Percent Paced: 0 %
Brady Statistic RV Percent Paced: 99 %
Date Time Interrogation Session: 20220411145231
Implantable Lead Implant Date: 19980824
Implantable Lead Implant Date: 19980824
Implantable Lead Location: 753859
Implantable Lead Location: 753860
Implantable Pulse Generator Implant Date: 20180214
Lead Channel Impedance Value: 340 Ohm
Lead Channel Impedance Value: 400 Ohm
Lead Channel Pacing Threshold Amplitude: 0.75 V
Lead Channel Pacing Threshold Amplitude: 1 V
Lead Channel Pacing Threshold Pulse Width: 0.4 ms
Lead Channel Pacing Threshold Pulse Width: 0.8 ms
Lead Channel Sensing Intrinsic Amplitude: 3.1 mV
Lead Channel Sensing Intrinsic Amplitude: 6.5 mV
Lead Channel Setting Pacing Amplitude: 2 V
Lead Channel Setting Pacing Amplitude: 2.5 V
Lead Channel Setting Pacing Pulse Width: 0.8 ms
Lead Channel Setting Sensing Sensitivity: 6 mV
Pulse Gen Model: 2272
Pulse Gen Serial Number: 7998385

## 2021-01-09 NOTE — Progress Notes (Signed)
Remote pacemaker transmission.   

## 2021-01-30 ENCOUNTER — Ambulatory Visit: Payer: Self-pay

## 2021-01-30 ENCOUNTER — Ambulatory Visit: Payer: Medicare HMO | Admitting: Family Medicine

## 2021-01-30 ENCOUNTER — Ambulatory Visit (INDEPENDENT_AMBULATORY_CARE_PROVIDER_SITE_OTHER): Payer: Medicare HMO

## 2021-01-30 ENCOUNTER — Other Ambulatory Visit: Payer: Self-pay

## 2021-01-30 VITALS — BP 132/74 | Ht 69.0 in | Wt 146.0 lb

## 2021-01-30 DIAGNOSIS — M25521 Pain in right elbow: Secondary | ICD-10-CM | POA: Diagnosis not present

## 2021-01-30 DIAGNOSIS — S5001XA Contusion of right elbow, initial encounter: Secondary | ICD-10-CM | POA: Diagnosis not present

## 2021-01-30 NOTE — Progress Notes (Signed)
Fontaine No, am serving as a scribe for Dr. Hulan Saas. This visit occurred during the SARS-CoV-2 public health emergency.  Safety protocols were in place, including screening questions prior to the visit, additional usage of staff PPE, and extensive cleaning of exam room while observing appropriate contact time as indicated for disinfecting solutions.   Ricky [name] is a 87 y.o. male who presents to [place] at [facility] today for f/u R knee hematoma. Patient notes slight swelling but that overall knee pain is better.   New R elbow pain ongoing since 01/29/21. MOI: Pushing himself using elbows to get back into bed. Felt a pop. Pt locates elbow pain to medial aspect. Painful when resting that portion of elbow on arm or if he brushes anything against the elbow.   Numbness/tingling:Denies any numbness in hand/fingers  Dx imaging: 09/21/20 R knee XR1/5/22 R knee XR  Pertinent review of systems: No fevers or chills  Relevant historical information: Permanent atrial fibrillation.  Anticoagulated with Eliquis   Exam:  BP 132/74   Ht 5\' 9"  (1.753 m)   Wt 146 lb (66.2 kg)   BMI 21.56 kg/m  General: Well Developed, well nourished, and in no acute distress.   MSK: Right elbow large hematoma medial elbow overlying epicondyle. Hematoma has coagulated and is now firm. Elbow is mildly tender at medial epicondyle.  Normal elbow motion and strength. Normal wrist grip strength flexion and extension strength. Pulses and capillary refill and sensation are intact distally.    Lab and Radiology Results  X-ray images right elbow obtained today personally and independently interpreted Significant soft tissue swelling.  No acute fractures are visible. Await formal radiology review  Diagnostic Limited MSK Ultrasound of: Right elbow Large hematoma overlying bony structures medial elbow. No fractures visible. Intact tendinous structures medial elbow and posterior  elbow.. Impression: Large hematoma medial elbow   Assessment and Plan: 87 y.o. male with right elbow hematoma.  Patient is a very large hematoma at the medial elbow.  Is unclear the fundamental cause.  Functionally his elbow and wrist are working normally and I do not see any fracture on x-ray.  Plan to treat conservatively with compression and continue mobilization.  Recheck in about 2 weeks.  This is very similar to a large hematoma that he had at the anterior knee earlier this year that ultimately resolved after several months of time and compression.  At this point the hematoma has coagulated and is not removable with aspiration.   PDMP not reviewed this encounter. Orders Placed This Encounter  Procedures  . [place] LIMITED JOINT SPACE STRUCTURES UP RIGHT(NO LINKED CHARGES)    Order Specific Question:   Reason for Exam (SYMPTOM  OR DIAGNOSIS REQUIRED)    Answer:   right elbow pain    Order Specific Question:   Preferred imaging location?    Answer:   [place]  . DG ELBOW COMPLETE RIGHT (3+VIEW)    Standing Status:   Future    Number of Occurrences:   1    Standing Expiration Date:   01/30/2022    Order Specific Question:   Reason for Exam (SYMPTOM  OR DIAGNOSIS REQUIRED)    Answer:   eval elbow    Order Specific Question:   Preferred imaging location?    Answer:   [facility]   No orders of the defined types were placed in this encounter.    Discussed warning signs or symptoms. Please see discharge  instructions. Patient expresses understanding.   The above documentation has been reviewed and is accurate and complete Lynne Leader, M.D.

## 2021-01-30 NOTE — Patient Instructions (Signed)
Thank you for coming in today.  Please get an Xray today before you leave  I recommend you obtained a compression sleeve to help with your joint problems. There are many options on the market however I recommend obtaining a full elbow Body Helix compression sleeve.  You can find information (including how to appropriate measure yourself for sizing) can be found at www.Body http://www.lambert.com/.  Many of these products are health savings account (HSA) eligible.   You can use the compression sleeve at any time throughout the day but is most important to use while being active as well as for 2 hours post-activity.   It is appropriate to ice following activity with the compression sleeve in place.  Recheck in 2 weeks.

## 2021-02-01 NOTE — Progress Notes (Signed)
No fracture is visible in the elbow.

## 2021-02-07 ENCOUNTER — Ambulatory Visit: Payer: Medicare HMO | Admitting: Family Medicine

## 2021-02-20 ENCOUNTER — Encounter: Payer: Self-pay | Admitting: Family Medicine

## 2021-02-20 ENCOUNTER — Ambulatory Visit (INDEPENDENT_AMBULATORY_CARE_PROVIDER_SITE_OTHER): Payer: Medicare HMO | Admitting: Family Medicine

## 2021-02-20 ENCOUNTER — Other Ambulatory Visit: Payer: Self-pay

## 2021-02-20 VITALS — BP 130/80 | Ht 69.0 in | Wt 142.6 lb

## 2021-02-20 DIAGNOSIS — T148XXA Other injury of unspecified body region, initial encounter: Secondary | ICD-10-CM

## 2021-02-20 NOTE — Patient Instructions (Signed)
Thank you for coming in today.  I am very happy that it is getting better.   Continue compression and padding.   Recheck in 6 weeks.   Ok to cancel if all better.

## 2021-02-20 NOTE — Progress Notes (Signed)
   I, Wendy Poet, LAT, ATC, am serving as scribe for Dr. Lynne Leader.  Ricky [name] is a 87 y.o. male who presents to [place] at [facility] today for f/u of R elbow pain / elbow hematoma.  He was last seen by Dr. Georgina Snell on 01/30/21 and was advised to use compression as the hematoma had coagulated and was not able to be aspirated.  Since his last visit, pt reports that his R elbow is better, reporting no pain and a decrease/elimination of the swelling.    Diagnostic testing: R elbow XR- 01/30/21  Pertinent review of systems: No fevers or chills  Relevant historical information: Anticoagulation   Exam:  BP 130/80 (BP Location: Right Arm, Patient Position: Sitting, Cuff Size: Normal)   Ht 5\' 9"  (1.753 m)   Wt 142 lb 9.6 oz (64.7 kg)   BMI 21.06 kg/m  General: Well Developed, well nourished, and in no acute distress.   MSK: Right elbow slight swelling medial aspect of elbow otherwise normal. Nontender normal motion normal strength.    Lab and Radiology Results  Narrative & Impression  CLINICAL DATA:  Posteromedial elbow pain with swelling and bruising for 1 day following injury.  EXAM: RIGHT ELBOW - COMPLETE 3+ VIEW  COMPARISON:  Ultrasound 01/30/2021  FINDINGS: The bones appear adequately mineralized for age. There is no evidence of acute fracture, dislocation or elbow joint effusion. Mild spurring of the coronoid process is noted. Focal anteromedial soft tissue swelling corresponding with hematoma on ultrasound. No evidence of foreign body. Scattered dermal and vascular calcifications in the forearm.  IMPRESSION: Medial soft tissue swelling/hematoma.  No acute osseous findings.   Electronically Signed   By: Richardean Sale M.D.   On: 02/01/2021 10:15    I, Lynne Leader, personally (independently) visualized and performed the interpretation of the images attached in this note.    Assessment and Plan: 87 y.o. male with right elbow  swelling.  Patient had a large posttraumatic hematoma medial elbow when last checked about 3 weeks ago.  This has significantly reduced in size.  Expect that it will continue to diminish with compression and padding.  Plan for continued conservative management and reassess in 6 weeks.  Recheck back as needed.  Precautions reviewed.     Discussed warning signs or symptoms. Please see discharge instructions. Patient expresses understanding.   The above documentation has been reviewed and is accurate and complete Lynne Leader, M.D.  Total encounter time 20 minutes including face-to-face time with the patient and, reviewing past medical record, and charting on the date of service.

## 2021-03-01 ENCOUNTER — Telehealth: Payer: Self-pay | Admitting: Internal Medicine

## 2021-03-01 DIAGNOSIS — L600 Ingrowing nail: Secondary | ICD-10-CM

## 2021-03-01 NOTE — Telephone Encounter (Signed)
   Patient requesting referral to Podiatrist for ingrown toenail

## 2021-03-04 NOTE — Telephone Encounter (Signed)
OK. Thx

## 2021-03-13 ENCOUNTER — Encounter: Payer: Self-pay | Admitting: Podiatry

## 2021-03-13 ENCOUNTER — Other Ambulatory Visit: Payer: Self-pay

## 2021-03-13 ENCOUNTER — Ambulatory Visit: Payer: Medicare HMO | Admitting: Podiatry

## 2021-03-13 DIAGNOSIS — L600 Ingrowing nail: Secondary | ICD-10-CM | POA: Diagnosis not present

## 2021-03-13 NOTE — Patient Instructions (Signed)

## 2021-03-13 NOTE — Progress Notes (Signed)
Subjective:   Patient ID: Ricky [name], male   DOB: 87 y.o.   MRN: [id number]   HPI Patient presents with caregiver stating he has had a chronic ingrown toenail of his left big toe and its been sore and makes it hard to wear shoe gear and trimming has been difficult.  Patient does not smoke and tries to be active for his age   Review of Systems  All other systems reviewed and are negative.      Objective:  Physical Exam Vitals and nursing note reviewed.  Constitutional:      Appearance: He is well-developed.  Pulmonary:     Effort: Pulmonary effort is normal.  Musculoskeletal:        General: Normal range of motion.  Skin:    General: Skin is warm.  Neurological:     Mental Status: He is alert.    Vascular status mildly diminished with diminished PT DP pulses bilateral digits are warm and perfused but there is some reduction reduced circulatory status with patient found to have normal neurological status.  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  I anesthetized 60 mg like Marcaine mixture using sterile instrumentation I removed the medial border cleaned up the bed reduce the thickness of it flushed and applied sterile dressing.  Begin soaks reappoint to recheck

## 2021-03-27 ENCOUNTER — Ambulatory Visit (INDEPENDENT_AMBULATORY_CARE_PROVIDER_SITE_OTHER): Payer: Medicare HMO

## 2021-03-27 DIAGNOSIS — I442 Atrioventricular block, complete: Secondary | ICD-10-CM

## 2021-03-28 LAB — CUP PACEART REMOTE DEVICE CHECK
Date Time Interrogation Session: 20220712174739
Implantable Lead Implant Date: 19980824
Implantable Lead Implant Date: 19980824
Implantable Lead Location: 753859
Implantable Lead Location: 753860
Implantable Pulse Generator Implant Date: 20180214
Pulse Gen Model: 2272
Pulse Gen Serial Number: 7998385

## 2021-03-31 NOTE — Progress Notes (Deleted)
   I, Wendy Poet, LAT, ATC, am serving as scribe for Dr. Lynne Leader.  Ricky [name] is a 87 y.o. male who presents to [place] at [facility] today for f/u of R elbow pain due to a hematoma.  He was last seen by Dr. Georgina Snell on 02/20/21 and noted marked improvement in his symptoms. He was advised to con't w/ elbow compression and padding.  Since his last visit, pt reports   Diagnostic imaging: R elbow XR- 01/30/21  Pertinent review of systems: ***  Relevant historical information: ***   Exam:  There were no vitals taken for this visit. General: Well Developed, well nourished, and in no acute distress.   MSK: ***    Lab and Radiology Results No results found for this or any previous visit (from the past 72 hour(s)). CUP PACEART REMOTE DEVICE CHECK  Result Date: 03/28/2021 Scheduled remote reviewed. Normal device function.  Long AF alert. Known Persistent AF. Adjust alerts as able. Next remote 91 days.      Assessment and Plan: 87 y.o. male with ***   PDMP not reviewed this encounter. No orders of the defined types were placed in this encounter.  No orders of the defined types were placed in this encounter.    Discussed warning signs or symptoms. Please see discharge instructions. Patient expresses understanding.   ***

## 2021-04-03 ENCOUNTER — Ambulatory Visit: Payer: Medicare HMO | Admitting: Family Medicine

## 2021-04-19 NOTE — Progress Notes (Signed)
Remote pacemaker transmission.   

## 2021-04-25 ENCOUNTER — Telehealth: Payer: Self-pay | Admitting: Internal Medicine

## 2021-04-25 NOTE — Chronic Care Management (AMB) (Signed)
  Chronic Care Management   Outreach Note  04/25/2021 Name: Ricky [name] MRN: [id number] DOB: 07/20/1923  Referred by: Cassandria Anger, MD Reason for referral : No chief complaint on file.   An unsuccessful telephone outreach was attempted today. The patient was referred to the pharmacist for assistance with care management and care coordination.   Follow Up Plan:   Lauretta Grill [facility] Scheduler

## 2021-05-02 ENCOUNTER — Ambulatory Visit (INDEPENDENT_AMBULATORY_CARE_PROVIDER_SITE_OTHER): Payer: Medicare HMO | Admitting: Internal Medicine

## 2021-05-02 ENCOUNTER — Telehealth: Payer: Self-pay | Admitting: Lab

## 2021-05-02 ENCOUNTER — Other Ambulatory Visit: Payer: Self-pay

## 2021-05-02 ENCOUNTER — Encounter: Payer: Self-pay | Admitting: Internal Medicine

## 2021-05-02 ENCOUNTER — Ambulatory Visit (INDEPENDENT_AMBULATORY_CARE_PROVIDER_SITE_OTHER): Payer: Medicare HMO

## 2021-05-02 VITALS — BP 130/60 | HR 60 | Temp 97.7°F | Ht 69.0 in | Wt 144.0 lb

## 2021-05-02 DIAGNOSIS — R270 Ataxia, unspecified: Secondary | ICD-10-CM

## 2021-05-02 DIAGNOSIS — I700 Atherosclerosis of aorta: Secondary | ICD-10-CM | POA: Diagnosis not present

## 2021-05-02 DIAGNOSIS — L570 Actinic keratosis: Secondary | ICD-10-CM | POA: Diagnosis not present

## 2021-05-02 DIAGNOSIS — I10 Essential (primary) hypertension: Secondary | ICD-10-CM

## 2021-05-02 DIAGNOSIS — I5032 Chronic diastolic (congestive) heart failure: Secondary | ICD-10-CM | POA: Diagnosis not present

## 2021-05-02 DIAGNOSIS — Z0000 Encounter for general adult medical examination without abnormal findings: Secondary | ICD-10-CM

## 2021-05-02 DIAGNOSIS — Z7901 Long term (current) use of anticoagulants: Secondary | ICD-10-CM | POA: Diagnosis not present

## 2021-05-02 DIAGNOSIS — I4821 Permanent atrial fibrillation: Secondary | ICD-10-CM

## 2021-05-02 LAB — CBC WITH DIFFERENTIAL/PLATELET
Basophils Absolute: 0 10*3/uL (ref 0.0–0.1)
Basophils Relative: 0.3 % (ref 0.0–3.0)
Eosinophils Absolute: 0.1 10*3/uL (ref 0.0–0.7)
Eosinophils Relative: 1 % (ref 0.0–5.0)
HCT: 39.6 % (ref 39.0–52.0)
Hemoglobin: 13.3 g/dL (ref 13.0–17.0)
Lymphocytes Relative: 27.7 % (ref 12.0–46.0)
Lymphs Abs: 1.6 10*3/uL (ref 0.7–4.0)
MCHC: 33.7 g/dL (ref 30.0–36.0)
MCV: 104 fl — ABNORMAL HIGH (ref 78.0–100.0)
Monocytes Absolute: 0.5 10*3/uL (ref 0.1–1.0)
Monocytes Relative: 9.1 % (ref 3.0–12.0)
Neutro Abs: 3.6 10*3/uL (ref 1.4–7.7)
Neutrophils Relative %: 61.9 % (ref 43.0–77.0)
Platelets: 201 10*3/uL (ref 150.0–400.0)
RBC: 3.8 Mil/uL — ABNORMAL LOW (ref 4.22–5.81)
RDW: 13.2 % (ref 11.5–15.5)
WBC: 5.9 10*3/uL (ref 4.0–10.5)

## 2021-05-02 LAB — COMPREHENSIVE METABOLIC PANEL
ALT: 26 U/L (ref 0–53)
AST: 30 U/L (ref 0–37)
Albumin: 4.1 g/dL (ref 3.5–5.2)
Alkaline Phosphatase: 69 U/L (ref 39–117)
BUN: 38 mg/dL — ABNORMAL HIGH (ref 6–23)
CO2: 34 mEq/L — ABNORMAL HIGH (ref 19–32)
Calcium: 9.8 mg/dL (ref 8.4–10.5)
Chloride: 99 mEq/L (ref 96–112)
Creatinine, Ser: 1.17 mg/dL (ref 0.40–1.50)
GFR: 52.89 mL/min — ABNORMAL LOW (ref >60.00)
Glucose, Bld: 88 mg/dL (ref 70–99)
Potassium: 4.8 mEq/L (ref 3.5–5.1)
Sodium: 139 mEq/L (ref 135–145)
Total Bilirubin: 0.6 mg/dL (ref 0.2–1.2)
Total Protein: 7.4 g/dL (ref 6.0–8.3)

## 2021-05-02 LAB — TSH: TSH: 1.78 u[IU]/mL (ref 0.35–5.50)

## 2021-05-02 MED ORDER — IRBESARTAN 150 MG PO TABS: 150.0000 mg | ORAL_TABLET | Freq: Every day | ORAL | 3 refills | Status: AC

## 2021-05-02 MED ORDER — TRIAMCINOLONE ACETONIDE 0.1 % EX OINT: 1.0000 "application " | TOPICAL_OINTMENT | Freq: Two times a day (BID) | CUTANEOUS | 2 refills | Status: AC

## 2021-05-02 NOTE — Assessment & Plan Note (Signed)
Cont on Eliquis- 

## 2021-05-02 NOTE — Patient Instructions (Signed)
Ricky [name] , Thank you for taking time to come for your Medicare Wellness Visit. I appreciate your ongoing commitment to your health goals. Please review the following plan we discussed and let me know if I can assist you in the future.   Screening recommendations/referrals: Colonoscopy: Not a candidate for screening due to age Recommended yearly ophthalmology/optometry visit for glaucoma screening and checkup Recommended yearly dental visit for hygiene and checkup  Vaccinations: Influenza vaccine: 06/17/2020 Pneumococcal vaccine: 12/30/2013, 04/27/2016 Tdap vaccine: 12/28/2016; due every 10 years Shingles vaccine: never done   Covid-19: 09/28/2019, 10/23/2019, 12/12/2020  Advanced directives: Please bring a copy of your health care power of attorney and living will to the office at your convenience.  Conditions/risks identified: Yes; Client understands the importance of follow-up with providers by attending scheduled visits and discussed goals to eat healthier, increase physical activity, exercise the brain, socialize more, get enough sleep and make time for laughter.  Next appointment: Please schedule your next Medicare Wellness Visit with your Nurse Health Advisor in 1 year by calling [phone].  Preventive Care 38 Years and Older, Male Preventive care refers to lifestyle choices and visits with your health care provider that can promote health and wellness. What does preventive care include? A yearly physical exam. This is also called an annual well check. Dental exams once or twice a year. Routine eye exams. Ask your health care provider how often you should have your eyes checked. Personal lifestyle choices, including: Daily care of your teeth and gums. Regular physical activity. Eating a healthy diet. Avoiding tobacco and drug use. Limiting alcohol use. Practicing safe sex. Taking low doses of aspirin every day. Taking vitamin and mineral supplements as recommended by your health  care provider. What happens during an annual well check? The services and screenings done by your health care provider during your annual well check will depend on your age, overall health, lifestyle risk factors, and family history of disease. Counseling  Your health care provider may ask you questions about your: Alcohol use. Tobacco use. Drug use. Emotional well-being. Home and relationship well-being. Sexual activity. Eating habits. History of falls. Memory and ability to understand (cognition). Work and work Statistician. Screening  You may have the following tests or measurements: Height, weight, and BMI. Blood pressure. Lipid and cholesterol levels. These may be checked every 5 years, or more frequently if you are over 32 years old. Skin check. Lung cancer screening. You may have this screening every year starting at age 41 if you have a 30-pack-year history of smoking and currently smoke or have quit within the past 15 years. Fecal occult blood test (FOBT) of the stool. You may have this test every year starting at age 50. Flexible sigmoidoscopy or colonoscopy. You may have a sigmoidoscopy every 5 years or a colonoscopy every 10 years starting at age 72. Prostate cancer screening. Recommendations will vary depending on your family history and other risks. Hepatitis C blood test. Hepatitis B blood test. Sexually transmitted disease (STD) testing. Diabetes screening. This is done by checking your blood sugar (glucose) after you have not eaten for a while (fasting). You may have this done every 1-3 years. Abdominal aortic aneurysm (AAA) screening. You may need this if you are a current or former smoker. Osteoporosis. You may be screened starting at age 62 if you are at high risk. Talk with your health care provider about your test results, treatment options, and if necessary, the need for more tests. Vaccines  Your health care  provider may recommend certain vaccines, such  as: Influenza vaccine. This is recommended every year. Tetanus, diphtheria, and acellular pertussis (Tdap, Td) vaccine. You may need a Td booster every 10 years. Zoster vaccine. You may need this after age 69. Pneumococcal 13-valent conjugate (PCV13) vaccine. One dose is recommended after age 89. Pneumococcal polysaccharide (PPSV23) vaccine. One dose is recommended after age 12. Talk to your health care provider about which screenings and vaccines you need and how often you need them. This information is not intended to replace advice given to you by your health care provider. Make sure you discuss any questions you have with your health care provider. Document Released: 09/30/2015 Document Revised: 05/23/2016 Document Reviewed: 07/05/2015 Elsevier Interactive Patient Education  2017 [place] Prevention in the Home Falls can cause injuries. They can happen to people of all ages. There are many things you can do to make your home safe and to help prevent falls. What can I do on the outside of my home? Regularly fix the edges of walkways and driveways and fix any cracks. Remove anything that might make you trip as you walk through a door, such as a raised step or threshold. Trim any bushes or trees on the path to your home. Use bright outdoor lighting. Clear any walking paths of anything that might make someone trip, such as rocks or tools. Regularly check to see if handrails are loose or broken. Make sure that both sides of any steps have handrails. Any raised decks and porches should have guardrails on the edges. Have any leaves, snow, or ice cleared regularly. Use sand or salt on walking paths during winter. Clean up any spills in your garage right away. This includes oil or grease spills. What can I do in the bathroom? Use night lights. Install grab bars by the toilet and in the tub and shower. Do not use towel bars as grab bars. Use non-skid mats or decals in the tub or  shower. If you need to sit down in the shower, use a plastic, non-slip stool. Keep the floor dry. Clean up any water that spills on the floor as soon as it happens. Remove soap buildup in the tub or shower regularly. Attach bath mats securely with double-sided non-slip rug tape. Do not have throw rugs and other things on the floor that can make you trip. What can I do in the bedroom? Use night lights. Make sure that you have a light by your bed that is easy to reach. Do not use any sheets or blankets that are too big for your bed. They should not hang down onto the floor. Have a firm chair that has side arms. You can use this for support while you get dressed. Do not have throw rugs and other things on the floor that can make you trip. What can I do in the kitchen? Clean up any spills right away. Avoid walking on wet floors. Keep items that you use a lot in easy-to-reach places. If you need to reach something above you, use a strong step stool that has a grab bar. Keep electrical cords out of the way. Do not use floor polish or wax that makes floors slippery. If you must use wax, use non-skid floor wax. Do not have throw rugs and other things on the floor that can make you trip. What can I do with my stairs? Do not leave any items on the stairs. Make sure that there are handrails on both  sides of the stairs and use them. Fix handrails that are broken or loose. Make sure that handrails are as long as the stairways. Check any carpeting to make sure that it is firmly attached to the stairs. Fix any carpet that is loose or worn. Avoid having throw rugs at the top or bottom of the stairs. If you do have throw rugs, attach them to the floor with carpet tape. Make sure that you have a light switch at the top of the stairs and the bottom of the stairs. If you do not have them, ask someone to add them for you. What else can I do to help prevent falls? Wear shoes that: Do not have high heels. Have  rubber bottoms. Are comfortable and fit you well. Are closed at the toe. Do not wear sandals. If you use a stepladder: Make sure that it is fully opened. Do not climb a closed stepladder. Make sure that both sides of the stepladder are locked into place. Ask someone to hold it for you, if possible. Clearly mark and make sure that you can see: Any grab bars or handrails. First and last steps. Where the edge of each step is. Use tools that help you move around (mobility aids) if they are needed. These include: Canes. Walkers. Scooters. Crutches. Turn on the lights when you go into a dark area. Replace any light bulbs as soon as they burn out. Set up your furniture so you have a clear path. Avoid moving your furniture around. If any of your floors are uneven, fix them. If there are any pets around you, be aware of where they are. Review your medicines with your doctor. Some medicines can make you feel dizzy. This can increase your chance of falling. Ask your doctor what other things that you can do to help prevent falls. This information is not intended to replace advice given to you by your health care provider. Make sure you discuss any questions you have with your health care provider. Document Released: 06/30/2009 Document Revised: 02/09/2016 Document Reviewed: 10/08/2014 Elsevier Interactive Patient Education  2017 [employer].

## 2021-05-02 NOTE — Progress Notes (Addendum)
Subjective:   Ricky [name] is a 87 y.o. male who presents for Medicare Annual/Subsequent preventive examination.  Review of Systems     Cardiac Risk Factors include: advanced age (>63mn, >>50women);family history of premature cardiovascular disease;hypertension;male gender     Objective:    Today's Vitals   05/02/21 1043 05/02/21 1102  BP:  130/60  Pulse:  60  Temp:  97.7 F (36.5 C)  Weight:  144 lb (65.3 kg)  Height:  '5\' 9"'$  (1.753 m)  PainSc: 0-No pain 0-No pain   Body mass index is 21.27 kg/m.  Advanced Directives 05/02/2021 03/31/2020 11/10/2018 02/10/2018 12/22/2017 10/31/2016 04/16/2016  Does Patient Have a Medical Advance Directive? Yes Yes Yes Yes Yes Yes Yes  Type of Advance Directive Living will;Healthcare Power of ABureauLiving will Healthcare Power of ABreesportof AMauriceLiving will -  Does patient want to make changes to medical advance directive? No - Patient declined No - Patient declined - No - Patient declined No - Patient declined No - Patient declined -  Copy of [place] Chart? No - copy requested - No - copy requested - - Yes No - copy requested  Would patient like information on creating a medical advance directive? - - - - - - -    Current Medications (verified) Outpatient Encounter Medications as of 05/02/2021  Medication Sig   apixaban (ELIQUIS) 5 MG TABS tablet Take 1 tablet (5 mg total) by mouth 2 (two) times daily.   Carboxymethylcellul-Glycerin (LUBRICATING EYE DROPS OP) Apply 1 drop to eye 5 (five) times daily.   carvedilol (COREG) 12.5 MG tablet Take 1 tablet (12.5 mg total) by mouth 2 (two) times daily.   Cholecalciferol 1000 UNITS tablet Take 1,000 Units by mouth daily. Reported on 12/23/2015   diphenhydrAMINE (BENADRYL) 25 mg capsule Take 1-2 capsules (25-50 mg total) by mouth every 4 (four) hours as needed for itching or allergies (1-2 for bee  stings).   irbesartan (AVAPRO) 150 MG tablet Take 1 tablet (150 mg total) by mouth daily.   levothyroxine (SYNTHROID, LEVOTHROID) 75 MCG tablet Take 75 mcg by mouth daily.   spironolactone (ALDACTONE) 25 MG tablet Take 1/2 (one-half) tablet by mouth once daily   torsemide (DEMADEX) 20 MG tablet Take 2 tablets by mouth once daily   No facility-administered encounter medications on file as of 05/02/2021.    Allergies (verified) Bee venom, Amiodarone hcl, Atorvastatin, Benazepril hcl, Clindamycin/lincomycin, Clonidine hydrochloride, Colesevelam, Diltiazem hcl, Ezetimibe, Levaquin [levofloxacin in d5w], Verapamil, and Amoxicillin   History: Past Medical History:  Diagnosis Date   ANXIETY 04/26/2008   Atrial fibrillation ([facility] 10/31/2010   Coumadin therapy;  Echo 6/12: EF 50-55%, moderate MR, moderate TR, PASP 52, mild LAE   BPH (benign prostatic hypertrophy)    CAP (community acquired pneumonia) 12/2017   CHF (congestive heart failure) ([facility]    Essential hypertension, benign 10/03/2007   HTN (hypertension) 06/07/2013   HYPOTHYROIDISM 09/04/2007   LYMPHADENOPATHY 04/06/2009   Nocturia    Pacemaker    Status post AV nodal ablation   TOBACCO USE, QUIT 07/14/2009   Past Surgical History:  Procedure Laterality Date   COLONOSCOPY     EYE SURGERY  2000   eye muscle release   EYE SURGERY     both cataracts   HERNIA REPAIR     INGUINAL HERNIA REPAIR Right 05/06/2013   Procedure: HERNIA REPAIR INGUINAL ADULT;  Surgeon: CAutumn [name]  Ricky Chimes, MD;  Location: [facility];  Service: General;  Laterality: Right;   PACEMAKER INSERTION  10/16/05   PPM GENERATOR CHANGEOUT N/A 10/31/2016   Procedure: PPM Generator Changeout;  Surgeon: Deboraha Sprang, MD;  Location: [facility];  Service: Cardiovascular;  Laterality: N/A;   Family History  Problem Relation Age of Onset   Hypertension Mother    Cancer Mother        uterine/cervical   Heart disease Father    Hypertension Other     Social History   Socioeconomic History   Marital status: Widowed    Spouse name: Not on file   Number of children: 2   Years of education: Not on file   Highest education level: Not on file  Occupational History   Not on file  Tobacco Use   Smoking status: Former    Packs/day: 1.00    Years: 30.00    Pack years: 30.00    Types: Cigarettes    Quit date: 05/01/1979    Years since quitting: 42.0   Smokeless tobacco: Never  Vaping Use   Vaping Use: Never used  Substance and Sexual Activity   Alcohol use: No    Alcohol/week: 0.0 standard drinks   Drug use: No   Sexual activity: Never  Other Topics Concern   Not on file  Social History Narrative   Lost his wife 2002 ; Her health was not good.   Social Determinants of Health   Financial Resource Strain: Low Risk    Difficulty of Paying Living Expenses: Not hard at all  Food Insecurity: No Food Insecurity   Worried About Charity fundraiser in the Last Year: Never true   [place] in the Last Year: Never true  Transportation Needs: No Transportation Needs   Lack of Transportation (Medical): No   Lack of Transportation (Non-Medical): No  Physical Activity: Sufficiently Active   Days of Exercise per Week: 5 days   Minutes of Exercise per Session: 30 min  Stress: No Stress Concern Present   Feeling of Stress : Not at all  Social Connections: Moderately Integrated   Frequency of Communication with Friends and Family: More than three times a week   Frequency of Social Gatherings with Friends and Family: More than three times a week   Attends Religious Services: 1 to 4 times per year   Active Member of [employer] or Organizations: No   Attends Archivist Meetings: 1 to 4 times per year   Marital Status: Widowed    Tobacco Counseling Counseling given: Not Answered   Clinical Intake:  Pre-visit preparation completed: Yes  Pain : No/denies pain Pain Score: 0-No pain     BMI - recorded:  21.27 Nutritional Status: BMI of 19-24  Normal Nutritional Risks: None Diabetes: No  How often do you need to have someone help you when you read instructions, pamphlets, or other written materials from your doctor or pharmacy?: 1 - Never What is the last grade level you completed in school?: 2 years at [employer]  Diabetic? no  Interpreter Needed?: No  Information entered by :: Lisette Abu, LPN   Activities of Daily Living In your present state of health, do you have any difficulty performing the following activities: 05/02/2021  Hearing? Y  Vision? N  Difficulty concentrating or making decisions? Y  Walking or climbing stairs? Y  Comment uses a rollator  Dressing or bathing? N  Doing errands, shopping? Darreld Mclean  Preparing Food and eating ? N  Using the Toilet? N  In the past six months, have you accidently leaked urine? Y  Do you have problems with loss of bowel control? N  Managing your Medications? N  Managing your Finances? N  Housekeeping or managing your Housekeeping? Y  Some recent data might be hidden    Patient Care Team: Plotnikov, Evie Lacks, MD as PCP - General Deboraha Sprang, MD as PCP - Cardiology (Cardiology) Deboraha Sprang, MD (Cardiology) Clarene Essex, MD as Consulting Physician (Gastroenterology)  Indicate any recent Medical Services you may have received from other than [facility] providers in the past year (date may be approximate).     Assessment:   This is a routine wellness examination for Gray.  Hearing/Vision screen Hearing Screening - Comments:: Patient has issues with hearing; no hearing aids. Vision Screening - Comments:: Patient wears glasses.  Last eye exam done by [facility].  Dietary issues and exercise activities discussed: Current Exercise Habits: Home exercise routine, Type of exercise: Other - see comments (does exercises inside the home), Time (Minutes): 30, Frequency (Times/Week): 5, Weekly Exercise (Minutes/Week): 150,  Intensity: Mild, Exercise limited by: cardiac condition(s);orthopedic condition(s)   Goals Addressed               This Visit's Progress     Patient Stated (pt-stated)        Increase my physical activity by walking more around the house.      Depression Screen PHQ 2/9 Scores 05/02/2021 03/31/2020 11/10/2018 08/11/2018 04/29/2017 04/16/2016 04/15/2015  PHQ - 2 Score 0 0 0 0 0 0 0    Fall Risk Fall Risk  05/02/2021 05/02/2021 03/31/2020 11/10/2018 08/11/2018  Falls in the past year? 0 0 0 0 0  Number falls in past yr: 0 0 0 0 -  Injury with Fall? 0 0 0 - -  Risk for fall due to : No Fall Risks No Fall Risks No Fall Risks Impaired balance/gait;Impaired mobility -  Follow up - - Falls evaluation completed Falls prevention discussed Falls evaluation completed    FALL RISK PREVENTION PERTAINING TO THE HOME:  Any stairs in or around the home? No  If so, are there any without handrails? No  Home free of loose throw rugs in walkways, pet beds, electrical cords, etc? Yes  Adequate lighting in your home to reduce risk of falls? Yes   ASSISTIVE DEVICES UTILIZED TO PREVENT FALLS:  Life alert? No  Use of a cane, walker or w/c? Yes  Grab bars in the bathroom? Yes  Shower chair or bench in shower? Yes  Elevated toilet seat or a handicapped toilet? Yes   TIMED UP AND GO:  Was the test performed? Yes .  Length of time to ambulate 10 feet: 10 sec.   Gait steady and fast with assistive device  Cognitive Function: Normal cognitive status assessed by direct observation by this Nurse Health Advisor. No abnormalities found.   MMSE - Mini Mental State Exam 03/31/2020 04/16/2016 04/15/2015  Not completed: Unable to complete (No Data) Unable to complete        Immunizations Immunization History  Administered Date(s) Administered   Fluad Quad(high Dose 65+) 05/19/2019, 06/17/2020   Influenza Split 07/03/2011, 06/25/2012   Influenza Whole 05/31/2010   Influenza, High Dose Seasonal PF  06/17/2009, 06/17/2012, 04/17/2014, 05/19/2015, 06/10/2015, 05/30/2016, 06/17/2016, 06/17/2017, 06/25/2017, 06/13/2018   Influenza,inj,Quad PF,6+ Mos 05/28/2013, 05/04/2014   Influenza-Unspecified 06/17/2001, 07/18/2002, 06/18/2003, 07/18/2004, 06/17/2005, 06/17/2006, 06/18/2007, 07/18/2008, 06/17/2010,  06/28/2011, 06/24/2019   PFIZER(Purple Top)SARS-COV-2 Vaccination 09/28/2019, 10/23/2019, 12/12/2020   Pneumococcal Conjugate-13 12/30/2013, 04/02/2014   Pneumococcal Polysaccharide-23 06/19/2006, 04/27/2016   Pneumococcal-Unspecified 06/17/2001   Td 01/10/2010   Tdap 12/09/2012, 12/28/2016   Zoster, Live 02/12/2007    TDAP status: Up to date  Flu Vaccine status: Up to date  Pneumococcal vaccine status: Up to date  Covid-19 vaccine status: Completed vaccines  Qualifies for Shingles Vaccine? Yes   Zostavax completed Yes   Shingrix Completed?: No.    Education has been provided regarding the importance of this vaccine. Patient has been advised to call insurance company to determine out of pocket expense if they have not yet received this vaccine. Advised may also receive vaccine at local pharmacy or Health Dept. Verbalized acceptance and understanding.  Screening Tests Health Maintenance  Topic Date Due   Zoster Vaccines- Shingrix (1 of 2) Never done   COVID-19 Vaccine (4 - Booster for Pfizer series) 03/14/2021   INFLUENZA VACCINE  04/17/2021   TETANUS/TDAP  12/29/2026   PNA vac Low Risk Adult  Completed   HPV VACCINES  Aged Out    Health Maintenance  Health Maintenance Due  Topic Date Due   Zoster Vaccines- Shingrix (1 of 2) Never done   COVID-19 Vaccine (4 - Booster for Pfizer series) 03/14/2021   INFLUENZA VACCINE  04/17/2021    Colorectal cancer screening: No longer required.   Lung Cancer Screening: (Low Dose CT Chest recommended if Age 68-80 years, 30 pack-year currently smoking OR have quit w/in 15years.) does not qualify.   Lung Cancer Screening Referral:  no  Additional Screening:  Hepatitis C Screening: does not qualify; Completed no  Vision Screening: Recommended annual ophthalmology exams for early detection of glaucoma and other disorders of the eye. Is the patient up to date with their annual eye exam?  Yes  Who is the provider or what is the name of the office in which the patient attends annual eye exams? [facility] If pt is not established with a provider, would they like to be referred to a provider to establish care? No .   Dental Screening: Recommended annual dental exams for proper oral hygiene  Community Resource Referral / Chronic Care Management: CRR required this visit?  No   CCM required this visit?  No      Plan:     I have personally reviewed and noted the following in the patient's chart:   Medical and social history Use of alcohol, tobacco or illicit drugs  Current medications and supplements including opioid prescriptions. Patient is not currently taking opioid prescriptions. Functional ability and status Nutritional status Physical activity Advanced directives List of other physicians Hospitalizations, surgeries, and ER visits in previous 12 months Vitals Screenings to include cognitive, depression, and falls Referrals and appointments  In addition, I have reviewed and discussed with patient certain preventive protocols, quality metrics, and best practice recommendations. A written personalized care plan for preventive services as well as general preventive health recommendations were provided to patient.     Sheral Flow, LPN   [id number]   Nurse Notes: n/a  Medical screening examination/treatment/procedure(s) were performed by non-physician practitioner and as supervising physician I was immediately available for consultation/collaboration.  I agree with above. Lew Dawes, MD

## 2021-05-02 NOTE — Assessment & Plan Note (Signed)
  On diet  

## 2021-05-02 NOTE — Assessment & Plan Note (Signed)
New AKs on L check x 3 small - pt declined treatment

## 2021-05-02 NOTE — Progress Notes (Signed)
  Chronic Care Management   Outreach Note  05/02/2021 Name: BRACK BAILIN MRN: [id number] DOB: 14-Aug-1923  Referred by: Cassandria Anger, MD Reason for referral : Medication Management   A second unsuccessful telephone outreach was attempted today. The patient was referred to pharmacist for assistance with care management and care coordination.  Follow Up Plan:   [place]

## 2021-05-02 NOTE — Progress Notes (Signed)
Subjective:  Patient ID: Ricky [name], male    DOB: 11-04-1922  Age: 87 y.o. MRN: [id number]  CC: Follow-up (6 month f/u)   HPI BABOUCARR SINGERMAN presents for AKs on face, HTN, CHF f/u  Outpatient Medications Prior to Visit  Medication Sig Dispense Refill   apixaban (ELIQUIS) 5 MG TABS tablet Take 1 tablet (5 mg total) by mouth 2 (two) times daily. 180 tablet 3   Carboxymethylcellul-Glycerin (LUBRICATING EYE DROPS OP) Apply 1 drop to eye 5 (five) times daily.     carvedilol (COREG) 12.5 MG tablet Take 1 tablet (12.5 mg total) by mouth 2 (two) times daily. 180 tablet 3   Cholecalciferol 1000 UNITS tablet Take 1,000 Units by mouth daily. Reported on 12/23/2015     diphenhydrAMINE (BENADRYL) 25 mg capsule Take 1-2 capsules (25-50 mg total) by mouth every 4 (four) hours as needed for itching or allergies (1-2 for bee stings). 60 capsule 2   irbesartan (AVAPRO) 150 MG tablet Take 1 tablet (150 mg total) by mouth daily. 90 tablet 3   levothyroxine (SYNTHROID, LEVOTHROID) 75 MCG tablet Take 75 mcg by mouth daily.     spironolactone (ALDACTONE) 25 MG tablet Take 1/2 (one-half) tablet by mouth once daily 45 tablet 3   torsemide (DEMADEX) 20 MG tablet Take 2 tablets by mouth once daily 180 tablet 3   No facility-administered medications prior to visit.    ROS: Review of Systems  Constitutional:  Positive for fatigue. Negative for appetite change and unexpected weight change.  HENT:  Negative for congestion, nosebleeds, sneezing, sore throat and trouble swallowing.   Eyes:  Negative for itching and visual disturbance.  Respiratory:  Negative for cough.   Cardiovascular:  Positive for leg swelling. Negative for chest pain and palpitations.  Gastrointestinal:  Negative for abdominal distention, blood in stool, diarrhea and nausea.  Genitourinary:  Negative for frequency and hematuria.  Musculoskeletal:  Positive for gait problem. Negative for back pain, joint swelling and neck pain.  Skin:   Positive for color change. Negative for rash.  Neurological:  Positive for weakness. Negative for dizziness, tremors and speech difficulty.  Psychiatric/Behavioral:  Positive for decreased concentration. Negative for agitation, dysphoric mood and sleep disturbance. The patient is not nervous/anxious.    Objective:  BP 130/60 (BP Location: Left Arm)   Pulse 60   Temp 97.7 F (36.5 C) (Oral)   Ht '5\' 9"'$  (1.753 m)   Wt 144 lb (65.3 kg)   BMI 21.27 kg/m   BP Readings from Last 3 Encounters:  05/02/21 130/60  05/02/21 130/60  02/20/21 130/80    Wt Readings from Last 3 Encounters:  05/02/21 144 lb (65.3 kg)  05/02/21 144 lb (65.3 kg)  02/20/21 142 lb 9.6 oz (64.7 kg)    Physical Exam Constitutional:      General: He is not in acute distress.    Appearance: He is well-developed.     Comments: NAD  Eyes:     Conjunctiva/sclera: Conjunctivae normal.     Pupils: Pupils are equal, round, and reactive to light.  Neck:     Thyroid: No thyromegaly.     Vascular: No JVD.  Cardiovascular:     Rate and Rhythm: Normal rate and regular rhythm.     Heart sounds: Normal heart sounds. No murmur heard.   No friction rub. No gallop.  Pulmonary:     Effort: Pulmonary effort is normal. No respiratory distress.     Breath sounds: Normal breath sounds. No  wheezing or rales.  Chest:     Chest wall: No tenderness.  Abdominal:     General: Bowel sounds are normal. There is no distension.     Palpations: Abdomen is soft. There is no mass.     Tenderness: There is no abdominal tenderness. There is no guarding or rebound.  Musculoskeletal:        General: Tenderness present. Normal range of motion.     Cervical back: Normal range of motion.  Lymphadenopathy:     Cervical: No cervical adenopathy.  Skin:    General: Skin is warm and dry.     Findings: No rash.  Neurological:     Mental Status: He is alert and oriented to person, place, and time.     Cranial Nerves: No cranial nerve deficit.      Motor: Weakness present. No abnormal muscle tone.     Coordination: Coordination normal.     Gait: Gait abnormal.     Deep Tendon Reflexes: Reflexes are normal and symmetric.  Psychiatric:        Behavior: Behavior normal.        Thought Content: Thought content normal.        Judgment: Judgment normal.  Using a walker AKs on L check x 3 small  Lab Results  Component Value Date   WBC 5.9 11/02/2020   HGB 13.3 11/02/2020   HCT 40.5 11/02/2020   PLT 194.0 11/02/2020   GLUCOSE 64 (L) 11/02/2020   CHOL 172 05/02/2020   TRIG 65.0 05/02/2020   HDL 58.50 05/02/2020   LDLCALC 101 (H) 05/02/2020   ALT 33 11/02/2020   AST 31 11/02/2020   NA 140 11/02/2020   K 4.8 11/02/2020   CL 101 11/02/2020   CREATININE 1.24 11/02/2020   BUN 48 (H) 11/02/2020   CO2 34 (H) 11/02/2020   TSH 0.85 11/02/2020   PSA 0.84 12/28/2013   INR 1.6 (H) 10/17/2016    No results found.  Assessment & Plan:     Walker Kehr, MD

## 2021-05-02 NOTE — Assessment & Plan Note (Signed)
Cont w/Coreg, Demadex, Irbesartan Check CMET

## 2021-05-02 NOTE — Assessment & Plan Note (Signed)
Use a walker No falls

## 2021-05-02 NOTE — Assessment & Plan Note (Signed)
Cont on Torsemide, Spironolactone F/u w/dr [name]

## 2021-05-02 NOTE — Assessment & Plan Note (Signed)
Cont on Eliquis 5 mg bid

## 2021-05-12 ENCOUNTER — Other Ambulatory Visit: Payer: Self-pay | Admitting: Internal Medicine

## 2021-05-18 ENCOUNTER — Telehealth: Payer: Self-pay | Admitting: Lab

## 2021-05-18 NOTE — Chronic Care Management (AMB) (Signed)
  Chronic Care Management   Note  05/18/2021 Name: Ricky [name] MRN: [id number] DOB: [birth year]  Ricky [name] is a 87 y.o. year old male who is a primary care patient of Plotnikov, Evie Lacks, MD. I reached out to [name] by phone today in response to a referral sent by [name]'s PCP, Plotnikov, Evie Lacks, MD.   Ricky [name] was given information about Chronic Care Management services today including:  CCM service includes personalized support from designated clinical staff supervised by his physician, including individualized plan of care and coordination with other care providers 24/7 contact phone numbers for assistance for urgent and routine care needs. Service will only be billed when office clinical staff spend 20 minutes or more in a month to coordinate care. Only one practitioner may furnish and bill the service in a calendar month. The patient may stop CCM services at any time (effective at the end of the month) by phone call to the office staff.   Patient agreed to services and verbal consent obtained.   Follow up plan:   [place]

## 2021-06-04 ENCOUNTER — Other Ambulatory Visit: Payer: Self-pay | Admitting: Internal Medicine

## 2021-06-13 ENCOUNTER — Emergency Department (HOSPITAL_COMMUNITY): Payer: Medicare HMO

## 2021-06-13 ENCOUNTER — Other Ambulatory Visit: Payer: Self-pay

## 2021-06-13 ENCOUNTER — Inpatient Hospital Stay (HOSPITAL_COMMUNITY): Payer: Medicare HMO

## 2021-06-13 ENCOUNTER — Inpatient Hospital Stay (HOSPITAL_COMMUNITY)
Admission: EM | Admit: 2021-06-13 | Discharge: 2021-07-18 | DRG: 177 | Disposition: E | Payer: Medicare HMO | Attending: Family Medicine | Admitting: Family Medicine

## 2021-06-13 DIAGNOSIS — Z743 Need for continuous supervision: Secondary | ICD-10-CM | POA: Diagnosis not present

## 2021-06-13 DIAGNOSIS — Y92009 Unspecified place in unspecified non-institutional (private) residence as the place of occurrence of the external cause: Secondary | ICD-10-CM

## 2021-06-13 DIAGNOSIS — Z880 Allergy status to penicillin: Secondary | ICD-10-CM | POA: Diagnosis not present

## 2021-06-13 DIAGNOSIS — R0902 Hypoxemia: Secondary | ICD-10-CM | POA: Diagnosis not present

## 2021-06-13 DIAGNOSIS — Z6820 Body mass index (BMI) 20.0-20.9, adult: Secondary | ICD-10-CM

## 2021-06-13 DIAGNOSIS — S0990XA Unspecified injury of head, initial encounter: Secondary | ICD-10-CM | POA: Diagnosis not present

## 2021-06-13 DIAGNOSIS — E039 Hypothyroidism, unspecified: Secondary | ICD-10-CM | POA: Diagnosis present

## 2021-06-13 DIAGNOSIS — J9601 Acute respiratory failure with hypoxia: Secondary | ICD-10-CM | POA: Diagnosis present

## 2021-06-13 DIAGNOSIS — M542 Cervicalgia: Secondary | ICD-10-CM | POA: Diagnosis not present

## 2021-06-13 DIAGNOSIS — I110 Hypertensive heart disease with heart failure: Secondary | ICD-10-CM | POA: Diagnosis not present

## 2021-06-13 DIAGNOSIS — R1312 Dysphagia, oropharyngeal phase: Secondary | ICD-10-CM | POA: Diagnosis present

## 2021-06-13 DIAGNOSIS — J90 Pleural effusion, not elsewhere classified: Secondary | ICD-10-CM | POA: Diagnosis not present

## 2021-06-13 DIAGNOSIS — R410 Disorientation, unspecified: Secondary | ICD-10-CM | POA: Diagnosis not present

## 2021-06-13 DIAGNOSIS — Z20822 Contact with and (suspected) exposure to covid-19: Secondary | ICD-10-CM | POA: Diagnosis not present

## 2021-06-13 DIAGNOSIS — T17908A Unspecified foreign body in respiratory tract, part unspecified causing other injury, initial encounter: Secondary | ICD-10-CM | POA: Diagnosis not present

## 2021-06-13 DIAGNOSIS — W01198A Fall on same level from slipping, tripping and stumbling with subsequent striking against other object, initial encounter: Secondary | ICD-10-CM | POA: Diagnosis not present

## 2021-06-13 DIAGNOSIS — M25561 Pain in right knee: Secondary | ICD-10-CM | POA: Diagnosis present

## 2021-06-13 DIAGNOSIS — R000 Tachycardia, unspecified: Secondary | ICD-10-CM | POA: Diagnosis not present

## 2021-06-13 DIAGNOSIS — R64 Cachexia: Secondary | ICD-10-CM | POA: Diagnosis present

## 2021-06-13 DIAGNOSIS — Z043 Encounter for examination and observation following other accident: Secondary | ICD-10-CM | POA: Diagnosis not present

## 2021-06-13 DIAGNOSIS — R296 Repeated falls: Secondary | ICD-10-CM | POA: Diagnosis present

## 2021-06-13 DIAGNOSIS — W19XXXA Unspecified fall, initial encounter: Secondary | ICD-10-CM | POA: Diagnosis present

## 2021-06-13 DIAGNOSIS — G238 Other specified degenerative diseases of basal ganglia: Secondary | ICD-10-CM | POA: Diagnosis not present

## 2021-06-13 DIAGNOSIS — E440 Moderate protein-calorie malnutrition: Secondary | ICD-10-CM | POA: Diagnosis not present

## 2021-06-13 DIAGNOSIS — S0081XA Abrasion of other part of head, initial encounter: Secondary | ICD-10-CM | POA: Diagnosis not present

## 2021-06-13 DIAGNOSIS — J690 Pneumonitis due to inhalation of food and vomit: Secondary | ICD-10-CM | POA: Diagnosis not present

## 2021-06-13 DIAGNOSIS — Z7189 Other specified counseling: Secondary | ICD-10-CM | POA: Diagnosis not present

## 2021-06-13 DIAGNOSIS — Z23 Encounter for immunization: Secondary | ICD-10-CM | POA: Diagnosis not present

## 2021-06-13 DIAGNOSIS — I482 Chronic atrial fibrillation, unspecified: Secondary | ICD-10-CM | POA: Diagnosis not present

## 2021-06-13 DIAGNOSIS — Z66 Do not resuscitate: Secondary | ICD-10-CM | POA: Diagnosis present

## 2021-06-13 DIAGNOSIS — R531 Weakness: Secondary | ICD-10-CM | POA: Insufficient documentation

## 2021-06-13 DIAGNOSIS — I5032 Chronic diastolic (congestive) heart failure: Secondary | ICD-10-CM | POA: Diagnosis present

## 2021-06-13 DIAGNOSIS — S51812A Laceration without foreign body of left forearm, initial encounter: Secondary | ICD-10-CM | POA: Diagnosis present

## 2021-06-13 DIAGNOSIS — G8929 Other chronic pain: Secondary | ICD-10-CM | POA: Diagnosis present

## 2021-06-13 DIAGNOSIS — T17908D Unspecified foreign body in respiratory tract, part unspecified causing other injury, subsequent encounter: Secondary | ICD-10-CM | POA: Diagnosis not present

## 2021-06-13 DIAGNOSIS — G9389 Other specified disorders of brain: Secondary | ICD-10-CM | POA: Diagnosis not present

## 2021-06-13 DIAGNOSIS — I10 Essential (primary) hypertension: Secondary | ICD-10-CM | POA: Diagnosis not present

## 2021-06-13 DIAGNOSIS — Z515 Encounter for palliative care: Secondary | ICD-10-CM | POA: Diagnosis not present

## 2021-06-13 DIAGNOSIS — R451 Restlessness and agitation: Secondary | ICD-10-CM | POA: Diagnosis present

## 2021-06-13 DIAGNOSIS — R54 Age-related physical debility: Secondary | ICD-10-CM | POA: Diagnosis present

## 2021-06-13 DIAGNOSIS — Z45018 Encounter for adjustment and management of other part of cardiac pacemaker: Secondary | ICD-10-CM

## 2021-06-13 DIAGNOSIS — I739 Peripheral vascular disease, unspecified: Secondary | ICD-10-CM | POA: Diagnosis not present

## 2021-06-13 DIAGNOSIS — M79632 Pain in left forearm: Secondary | ICD-10-CM | POA: Diagnosis not present

## 2021-06-13 DIAGNOSIS — K5909 Other constipation: Secondary | ICD-10-CM | POA: Diagnosis present

## 2021-06-13 DIAGNOSIS — R269 Unspecified abnormalities of gait and mobility: Secondary | ICD-10-CM | POA: Diagnosis not present

## 2021-06-13 DIAGNOSIS — R9431 Abnormal electrocardiogram [ECG] [EKG]: Secondary | ICD-10-CM | POA: Diagnosis not present

## 2021-06-13 LAB — URINALYSIS, ROUTINE W REFLEX MICROSCOPIC
Bilirubin Urine: NEGATIVE
Glucose, UA: NEGATIVE mg/dL
Hgb urine dipstick: NEGATIVE
Ketones, ur: NEGATIVE mg/dL
Leukocytes,Ua: NEGATIVE
Nitrite: NEGATIVE
Protein, ur: NEGATIVE mg/dL
Specific Gravity, Urine: 1.014 (ref 1.005–1.030)
pH: 8 (ref 5.0–8.0)

## 2021-06-13 LAB — CBG MONITORING, ED: Glucose-Capillary: 94 mg/dL (ref 70–99)

## 2021-06-13 LAB — CBC WITH DIFFERENTIAL/PLATELET
Abs Immature Granulocytes: 0.03 10*3/uL (ref 0.00–0.07)
Basophils Absolute: 0 10*3/uL (ref 0.0–0.1)
Basophils Relative: 0 %
Eosinophils Absolute: 0.1 10*3/uL (ref 0.0–0.5)
Eosinophils Relative: 1 %
HCT: 42.1 % (ref 39.0–52.0)
Hemoglobin: 13.6 g/dL (ref 13.0–17.0)
Immature Granulocytes: 1 %
Lymphocytes Relative: 28 %
Lymphs Abs: 1.5 10*3/uL (ref 0.7–4.0)
MCH: 34.7 pg — ABNORMAL HIGH (ref 26.0–34.0)
MCHC: 32.3 g/dL (ref 30.0–36.0)
MCV: 107.4 fL — ABNORMAL HIGH (ref 80.0–100.0)
Monocytes Absolute: 0.5 10*3/uL (ref 0.1–1.0)
Monocytes Relative: 9 %
Neutro Abs: 3.2 10*3/uL (ref 1.7–7.7)
Neutrophils Relative %: 61 %
Platelets: 211 10*3/uL (ref 150–400)
RBC: 3.92 MIL/uL — ABNORMAL LOW (ref 4.22–5.81)
RDW: 12.7 % (ref 11.5–15.5)
WBC: 5.3 10*3/uL (ref 4.0–10.5)
nRBC: 0 % (ref 0.0–0.2)

## 2021-06-13 LAB — BASIC METABOLIC PANEL
Anion gap: 7 (ref 5–15)
BUN: 28 mg/dL — ABNORMAL HIGH (ref 8–23)
CO2: 28 mmol/L (ref 22–32)
Calcium: 9.2 mg/dL (ref 8.9–10.3)
Chloride: 100 mmol/L (ref 98–111)
Creatinine, Ser: 1.06 mg/dL (ref 0.61–1.24)
GFR, Estimated: >60 mL/min (ref >60)
Glucose, Bld: 92 mg/dL (ref 70–99)
Potassium: 4.4 mmol/L (ref 3.5–5.1)
Sodium: 135 mmol/L (ref 135–145)

## 2021-06-13 MED ORDER — SODIUM CHLORIDE 0.9 % IV SOLN
2.0000 g | INTRAVENOUS | Status: DC
  Administered 2021-06-13 – 2021-06-15 (×3): 2 g via INTRAVENOUS
  Filled 2021-06-13 (×3): qty 20

## 2021-06-13 MED ORDER — ACETAMINOPHEN 325 MG PO TABS
650.0000 mg | ORAL_TABLET | Freq: Four times a day (QID) | ORAL | Status: DC | PRN
  Administered 2021-06-16: 650 mg via ORAL
  Filled 2021-06-13: qty 2

## 2021-06-13 MED ORDER — HYDRALAZINE HCL 20 MG/ML IJ SOLN
5.0000 mg | Freq: Four times a day (QID) | INTRAMUSCULAR | Status: DC | PRN
  Administered 2021-06-14 – 2021-06-16 (×3): 5 mg via INTRAVENOUS
  Filled 2021-06-13 (×3): qty 1

## 2021-06-13 MED ORDER — SODIUM CHLORIDE 0.9 % IV SOLN: INTRAVENOUS | Status: DC

## 2021-06-13 MED ORDER — LIDOCAINE 5 % EX PTCH
1.0000 | MEDICATED_PATCH | CUTANEOUS | Status: DC
  Administered 2021-06-13 – 2021-06-22 (×9): 1 via TRANSDERMAL
  Filled 2021-06-13 (×6): qty 1

## 2021-06-13 MED ORDER — LEVOTHYROXINE SODIUM 75 MCG PO TABS
75.0000 ug | ORAL_TABLET | Freq: Every day | ORAL | Status: DC
  Administered 2021-06-14: 75 ug via ORAL
  Filled 2021-06-13: qty 1

## 2021-06-13 MED ORDER — IRBESARTAN 150 MG PO TABS
150.0000 mg | ORAL_TABLET | Freq: Every day | ORAL | Status: DC
  Filled 2021-06-13: qty 1

## 2021-06-13 MED ORDER — TETANUS-DIPHTH-ACELL PERTUSSIS 5-2.5-18.5 LF-MCG/0.5 IM SUSY
0.5000 mL | PREFILLED_SYRINGE | Freq: Once | INTRAMUSCULAR | Status: AC
  Administered 2021-06-13: 0.5 mL via INTRAMUSCULAR
  Filled 2021-06-13: qty 0.5

## 2021-06-13 MED ORDER — HYDRALAZINE HCL 25 MG PO TABS: 25.0000 mg | ORAL_TABLET | Freq: Four times a day (QID) | ORAL | Status: DC | PRN

## 2021-06-13 MED ORDER — ONDANSETRON HCL 4 MG PO TABS
4.0000 mg | ORAL_TABLET | Freq: Four times a day (QID) | ORAL | Status: DC | PRN
Start: 2021-06-13 — End: 2021-06-16

## 2021-06-13 MED ORDER — LACTULOSE 10 GM/15ML PO SOLN
30.0000 g | Freq: Two times a day (BID) | ORAL | Status: DC
  Administered 2021-06-13: 30 g via ORAL
  Filled 2021-06-13: qty 60

## 2021-06-13 MED ORDER — ACETAMINOPHEN 650 MG RE SUPP: 650.0000 mg | Freq: Four times a day (QID) | RECTAL | Status: DC | PRN

## 2021-06-13 MED ORDER — GUAIFENESIN ER 600 MG PO TB12: 1200.0000 mg | ORAL_TABLET | Freq: Two times a day (BID) | ORAL | Status: DC

## 2021-06-13 MED ORDER — HEPARIN SODIUM (PORCINE) 5000 UNIT/ML IJ SOLN
5000.0000 [IU] | Freq: Two times a day (BID) | INTRAMUSCULAR | Status: DC
  Administered 2021-06-14 (×2): 5000 [IU] via SUBCUTANEOUS
  Filled 2021-06-13 (×2): qty 1

## 2021-06-13 MED ORDER — IPRATROPIUM-ALBUTEROL 0.5-2.5 (3) MG/3ML IN SOLN
3.0000 mL | Freq: Four times a day (QID) | RESPIRATORY_TRACT | Status: DC
  Administered 2021-06-13 – 2021-06-14 (×3): 3 mL via RESPIRATORY_TRACT
  Filled 2021-06-13 (×3): qty 3

## 2021-06-13 MED ORDER — SPIRONOLACTONE 12.5 MG HALF TABLET
12.5000 mg | ORAL_TABLET | Freq: Every day | ORAL | Status: DC
  Filled 2021-06-13: qty 1

## 2021-06-13 MED ORDER — TORSEMIDE 20 MG PO TABS: 40.0000 mg | ORAL_TABLET | Freq: Every day | ORAL | Status: DC

## 2021-06-13 MED ORDER — CARVEDILOL 12.5 MG PO TABS
12.5000 mg | ORAL_TABLET | Freq: Two times a day (BID) | ORAL | Status: DC
  Filled 2021-06-13: qty 1

## 2021-06-13 MED ORDER — SODIUM CHLORIDE 0.9 % IV BOLUS
500.0000 mL | Freq: Once | INTRAVENOUS | Status: AC
  Administered 2021-06-13: 500 mL via INTRAVENOUS

## 2021-06-13 MED ORDER — ONDANSETRON HCL 4 MG/2ML IJ SOLN: 4.0000 mg | Freq: Four times a day (QID) | INTRAMUSCULAR | Status: DC | PRN

## 2021-06-13 MED ORDER — APIXABAN 5 MG PO TABS: 5.0000 mg | ORAL_TABLET | Freq: Two times a day (BID) | ORAL | Status: DC

## 2021-06-13 NOTE — ED Notes (Signed)
Pt was unable to swallow food, pt stated that he would not like to eat at this time.

## 2021-06-13 NOTE — ED Triage Notes (Signed)
To ED via [facility] from [place]- lost balance this am reached out for exercise bike, bike toppled and so did pt. Small abrasion to chin , skin tears to left forearm. Alert/oriented x 4.

## 2021-06-13 NOTE — ED Notes (Signed)
Attempted orthostatic vital signs but pt was too weak to stand up

## 2021-06-13 NOTE — Progress Notes (Signed)
Nurse reported patient choking and cough on food and pills and liquids.  O2 saturation decreased then returned to normal.  Will hold off while his p.o. BP meds including torsemide and Eliquis. Heparin subcu for DVT prophylaxis for now.   Strict NPO Reevaluate tomorrow by speech to resume p.o.

## 2021-06-13 NOTE — H&P (Signed)
History and Physical    Ricky [name] TXM:[id number] DOB: [birth year] DOA: 05/29/2021  PCP: Ricky Anger, MD (Confirm with patient/family/NH records and if not entered, this has to be entered at [facility] point of entry) Patient coming from: Home  I have personally briefly reviewed patient's old medical records in [place]  Chief Complaint: Too weak  HPI: Ricky [name] is a 87 y.o. male with medical history significant of chronic A. fib status post PPM, on Eliquis, HTN, chronic diastolic CHF, hypothyroidism, chronic ambulation dysfunction, presented with worsening of generalized weakness, fall this morning.  Patient lives by himself, with son frequently to check.  At baseline, patient is able to take care of himself most functions.  Uses a walker to ambulate, used to be able to walk 10 to 20 minutes by himself with a walker.  But son reported that patient's ambulation function has significant deteriorated since summer this year, and recent weeks unable to walk around his own house.  And reported he has had worsening of bilateral leg and feet numbness " for some time", and he feels "very unsteady, and tend to fall" he said there is no different up strength on his legs, and numbness in the same on both legs.  He has been feeling weak since last week, quit shower and switch to sponge bath for more than 7 days, and he has been constipated for more than 1 week, and this morning he had to really strain to pass a small mount of stool.  Bowel completed sponge bath and try to stand up, patient fell and hit his head on a monitor, denied any LOC, no chest pain or shortness of breath.  He reports occasionally cough and choke after eating, especially liquid.  He has not been able to take any of his blood pressure meds this morning.  ED Course: Blood pressure significantly elevated, PPM was interrogated and found there was no significant arrhythmia this morning.  CT head, x-ray and shoulder  negative for acute findings.  CBC and BMP largely within normal limits.  Review of Systems: As per HPI otherwise 14 point review of systems negative.    No past medical history on file.    has no history on file for tobacco use, alcohol use, and drug use.  Not on File  No family history on file.   Prior to Admission medications   Not on File    Physical Exam: Vitals:   05/20/2021 1245 06/03/2021 1300 06/04/2021 1315 06/15/2021 1330  BP: (!) 183/105 (!) 186/92 (!) 180/96 (!) 189/98  Pulse: 73 69 69 78  Resp: 12 18 18 18   Temp:      TempSrc:      SpO2: 95% 94% 94% 93%  Weight:      Height:        Constitutional: NAD, calm, comfortable Vitals:   06/08/2021 1245 06/01/2021 1300 05/21/2021 1315 05/23/2021 1330  BP: (!) 183/105 (!) 186/92 (!) 180/96 (!) 189/98  Pulse: 73 69 69 78  Resp: 12 18 18 18   Temp:      TempSrc:      SpO2: 95% 94% 94% 93%  Weight:      Height:       Eyes: PERRL, lids and conjunctivae normal ENMT: Mucous membranes are dry. Posterior pharynx clear of any exudate or lesions.Normal dentition.  Neck: normal, supple, no masses, no thyromegaly Respiratory: clear to auscultation bilaterally, no wheezing, no crackles. Normal respiratory effort. No accessory muscle use.  Cardiovascular: Regular rate and rhythm, no murmurs / rubs / gallops. No extremity edema. 2+ pedal pulses. No carotid bruits.  Abdomen: no tenderness, no masses palpated. No hepatosplenomegaly. Bowel sounds positive.  Musculoskeletal: no clubbing / cyanosis. No joint deformity upper and lower extremities. Good ROM, no contractures. Normal muscle tone.  Skin: no rashes, lesions, ulcers. No induration Neurologic: CN 2-12 grossly intact.  Decreased light touch sensation on bilateral legs below the knees and bilateral feet, DTR normal. Strength 5/5 in all 4.  Psychiatric: Normal judgment and insight. Alert and oriented x 3. Normal mood.     Labs on Admission: I have personally reviewed following labs and  imaging studies  CBC: Recent Labs  Lab 06/06/2021 1019  WBC 5.3  NEUTROABS 3.2  HGB 13.6  HCT 42.1  MCV 107.4*  PLT 741   Basic Metabolic Panel: Recent Labs  Lab 05/25/2021 1019  NA 135  K 4.4  CL 100  CO2 28  GLUCOSE 92  BUN 28*  CREATININE 1.06  CALCIUM 9.2   GFR: Estimated Creatinine Clearance: 38.8 mL/min (by C-G formula based on SCr of 1.06 mg/dL). Liver Function Tests: No results for input(s): AST, ALT, ALKPHOS, BILITOT, PROT, ALBUMIN in the last 168 hours. No results for input(s): LIPASE, AMYLASE in the last 168 hours. No results for input(s): AMMONIA in the last 168 hours. Coagulation Profile: No results for input(s): INR, PROTIME in the last 168 hours. Cardiac Enzymes: No results for input(s): CKTOTAL, CKMB, CKMBINDEX, TROPONINI in the last 168 hours. BNP (last 3 results) No results for input(s): PROBNP in the last 8760 hours. HbA1C: No results for input(s): HGBA1C in the last 72 hours. CBG: Recent Labs  Lab 05/29/2021 1025  GLUCAP 94   Lipid Profile: No results for input(s): CHOL, HDL, LDLCALC, TRIG, CHOLHDL, LDLDIRECT in the last 72 hours. Thyroid Function Tests: No results for input(s): TSH, T4TOTAL, FREET4, T3FREE, THYROIDAB in the last 72 hours. Anemia Panel: No results for input(s): VITAMINB12, FOLATE, FERRITIN, TIBC, IRON, RETICCTPCT in the last 72 hours. Urine analysis: No results found for: COLORURINE, APPEARANCEUR, LABSPEC, [place], GLUCOSEU, HGBUR, BILIRUBINUR, KETONESUR, PROTEINUR, UROBILINOGEN, NITRITE, LEUKOCYTESUR  Radiological Exams on Admission: DG Chest 2 View  Result Date: 06/08/2021 CLINICAL DATA:  Weakness, fall EXAM: CHEST - 2 VIEW COMPARISON:  None. FINDINGS: Right pacer in place with leads in the right atrium and right ventricle. Heart is normal size. Biapical and bibasilar scarring. Small bilateral effusions suspected. No acute confluent opacities. IMPRESSION: Small bilateral effusions. Chronic changes in the lungs. Electronically  Signed   By: Rolm Baptise M.D.   On: 05/19/2021 11:38   DG Forearm Left  Result Date: 06/10/2021 CLINICAL DATA:  Fall forearm pain EXAM: LEFT FOREARM - 2 VIEW COMPARISON:  None. FINDINGS: There is no evidence of fracture or other focal bone lesions. Soft tissues are unremarkable. IMPRESSION: Negative. Electronically Signed   By: Rolm Baptise M.D.   On: 06/10/2021 11:38   CT HEAD WO CONTRAST (5MM)  Result Date: 06/11/2021 CLINICAL DATA:  Level 2 trauma, fall.  On blood thinners. EXAM: CT HEAD WITHOUT CONTRAST TECHNIQUE: Contiguous axial images were obtained from the base of the skull through the vertex without intravenous contrast. COMPARISON:  None. FINDINGS: Brain: There is atrophy and chronic small vessel disease changes. Bilateral basal ganglia calcifications. No acute intracranial abnormality. Specifically, no hemorrhage, hydrocephalus, mass lesion, acute infarction, or significant intracranial injury. Vascular: No hyperdense vessel or unexpected calcification. Skull: No acute calvarial abnormality. Sinuses/Orbits: No acute findings Other: None  IMPRESSION: Atrophy, chronic microvascular disease. No acute intracranial abnormality. Electronically Signed   By: Rolm Baptise M.D.   On: 05/31/2021 11:04    EKG: Independently reviewed. V paced.  Assessment/Plan Active Problems:   Fall at home, initial encounter   Fall  (please populate well all problems here in Problem List. (For example, if patient is on BP meds at home and you resume or decide to hold them, it is a problem that needs to be her. Same for CAD, COPD, HLD and so on)  Mechanical fall, acute on chronic ambulation dysfunction -Mechanical fall, no symptoms of near syncope.  PPM interrogated and negative for any significant arrhythmia event. -Likely has some level of peripheral neuropathy, will check B12 level. -PT evaluation, likely will need SNF for rehab.  Moderate protein calorie malnutrition -Patient reports he feels he has been  losing weight compared to earlier this year but cannot specify how much weight. -Suspect underlying dysphagia.  Dysphagia -Appears to have aspiration symptoms on both solid and liquid, will consult speech -Soft diet for now. -Chest x-ray showed no signs of pneumonia and patient stable on room air.  Chronic A. Fib -No symptoms or signs of intracranial bleeding, or hematoma, CT head reassuring, will check CT neck, plan to resume Eliquis tomorrow (24 hours).  HTN, uncontrolled -BP elevated, will resume home ARB, and aldactone.  Chronic diastolic CHF -Appears to be dry, encourage p.o. intake, resume torsemide tomorrow.  Chronic constipation -Denied any abdominal pain -Soap water enema x1, lactulose twice daily  DVT prophylaxis: Eliquis Code Status: DNR Family Communication: Son at bedside Disposition Plan: Expect more than 2 midnight hospital stay and discharge to SNF. Consults called: None Admission status: [facility] admit   Lequita Halt MD [facility] Pager [phone]  06/12/2021, 2:01 PM

## 2021-06-13 NOTE — ED Notes (Signed)
Pt encouraged to take a sip of water. Pt able to swallow water through a straw with no episodes of choking. Pt agreed to try to take the liquid medicine at this time.

## 2021-06-13 NOTE — ED Notes (Signed)
Pt taken off NRB and O2 titrated down to 2L via N/C. Pt states he is not coughing as much now.

## 2021-06-13 NOTE — Progress Notes (Signed)
O2 sat 95% on NRB, admit to PCU, D/W pharmacy, patient allergic to Amoxicillin, parm recommend Ceftriaxone.  Called and left son detailed message.

## 2021-06-13 NOTE — ED Notes (Signed)
Pt only got 30 mL of lactulose down and then started choking on it.SpO2 dropped to 89% briefly. Pt provided with suction to airway and encouraged to cough. SpO2 returned to 95% on R/A. Lung sounds diminished. Will keep pt NPO and notify MD.

## 2021-06-13 NOTE — ED Notes (Signed)
Pt states he doesn't think he can swallow medicine and does not want to take it at this time.

## 2021-06-13 NOTE — ED Notes (Signed)
Roosevelt Locks, MD at bedside.

## 2021-06-13 NOTE — ED Notes (Signed)
RT paged and at bedside. Pt placed on NRB. Pt encouraged to keep coughing and spitting into suction

## 2021-06-13 NOTE — Progress Notes (Signed)
Orthopedic Tech Progress Note Patient Details:  Ricky [name] 08/14/1923 [id number]  Level 2 trauma   Patient ID: Ricky [name], male   DOB: 03/18/1923, 87 y.o.   MRN: [id number]  Ricky [name] 06/15/2021, 12:30 PM

## 2021-06-13 NOTE — ED Provider Notes (Addendum)
[facility] EMERGENCY DEPARTMENT Provider Note   CSN: [id number] Arrival date & time: 05/24/2021  1003     History Chief Complaint  Patient presents with   Level 2 fall    Ricky [name] is a 87 y.o. male.  HPI 87 year old male presents as a level 2 trauma.  He was getting out after a sponge bath and was trying to put his shirt on and lost his balance and fell.  He tried to grab onto the exercise bike but pulled it down.  Suffered a skin tear to his left forearm with some mild pain.  He also hit his head though he denies any headache.  Small abrasion to his chin but no pain.  He denies dizziness or lightheadedness or chest pain.  He states he loses balance from time to time.  Son at the bedside states that his family member he lives with indicated he has been feeling weaker than normal over the last day or 2.  He had a hard time getting up out of the chair yesterday.  Patient states this was because he had not done his exercises and whenever he does not do them he starts to become weaker from laying around.  No past medical history on file.  Patient Active Problem List   Diagnosis Date Noted   Fall at home, initial encounter 05/30/2021   Fall 06/16/2021           No family history on file.     Home Medications Prior to Admission medications   Not on File    Allergies    Patient has no allergy information on record.  Review of Systems   Review of Systems  Constitutional:  Negative for fever.  Respiratory:  Negative for shortness of breath.   Cardiovascular:  Negative for chest pain.  Gastrointestinal:  Negative for abdominal pain.  Musculoskeletal:  Positive for arthralgias.  Skin:  Positive for wound.  Neurological:  Positive for weakness. Negative for headaches.  All other systems reviewed and are negative.  Physical Exam Updated Vital Signs BP (!) 189/98   Pulse 78   Temp (!) 97.2 F (36.2 C) (Oral)   Resp 18   Ht 5\' 10"  (1.778 m)    Wt 65.8 kg   SpO2 93%   BMI 20.81 kg/m   Physical Exam Vitals and nursing note reviewed.  Constitutional:      Appearance: He is well-developed.  HENT:     Head: Normocephalic.      Right Ear: External ear normal.     Left Ear: External ear normal.     Nose: Nose normal.  Eyes:     General:        Right eye: No discharge.        Left eye: No discharge.     Extraocular Movements: Extraocular movements intact.     Pupils: Pupils are equal, round, and reactive to light.  Cardiovascular:     Rate and Rhythm: Normal rate and regular rhythm.     Pulses:          Radial pulses are 2+ on the left side.     Heart sounds: Normal heart sounds.  Pulmonary:     Effort: Pulmonary effort is normal.     Breath sounds: Normal breath sounds.  Abdominal:     Palpations: Abdomen is soft.     Tenderness: There is no abdominal tenderness.  Musculoskeletal:     Comments: Left forearm with some  mild tenderness near two superficial skin tears  Skin:    General: Skin is warm and dry.  Neurological:     Mental Status: He is alert and oriented to person, place, and time.     Comments: Equal strength in both upper extremities.  Mild weakness but symmetric in both lower extremities.  This feels about baseline to patient.  Psychiatric:        Mood and Affect: Mood is not anxious.    ED Results / Procedures / Treatments   Labs (all labs ordered are listed, but only abnormal results are displayed) Labs Reviewed  BASIC METABOLIC PANEL - Abnormal; Notable for the following components:      Result Value   BUN 28 (*)    All other components within normal limits  CBC WITH DIFFERENTIAL/PLATELET - Abnormal; Notable for the following components:   RBC 3.92 (*)    MCV 107.4 (*)    MCH 34.7 (*)    All other components within normal limits  URINALYSIS, ROUTINE W REFLEX MICROSCOPIC  VITAMIN B12  FOLATE  TSH  MAGNESIUM  PHOSPHORUS  CBG MONITORING, ED    EKG ED ECG REPORT   Date: 06/15/2021   Rate: 76  Rhythm:  Paced Rhythm  QRS Axis: normal  Intervals: QT prolonged  ST/T Wave abnormalities: normal  Conduction Disutrbances:left bundle branch block  Narrative Interpretation:   Old EKG Reviewed: none available  I have personally reviewed the EKG tracing and agree with the computerized printout as noted.   Radiology DG Chest 2 View  Result Date: 06/07/2021 CLINICAL DATA:  Weakness, fall EXAM: CHEST - 2 VIEW COMPARISON:  None. FINDINGS: Right pacer in place with leads in the right atrium and right ventricle. Heart is normal size. Biapical and bibasilar scarring. Small bilateral effusions suspected. No acute confluent opacities. IMPRESSION: Small bilateral effusions. Chronic changes in the lungs. Electronically Signed   By: Rolm Baptise M.D.   On: 06/05/2021 11:38   DG Forearm Left  Result Date: 06/02/2021 CLINICAL DATA:  Fall forearm pain EXAM: LEFT FOREARM - 2 VIEW COMPARISON:  None. FINDINGS: There is no evidence of fracture or other focal bone lesions. Soft tissues are unremarkable. IMPRESSION: Negative. Electronically Signed   By: Rolm Baptise M.D.   On: 05/26/2021 11:38   CT HEAD WO CONTRAST (5MM)  Result Date: 05/24/2021 CLINICAL DATA:  Level 2 trauma, fall.  On blood thinners. EXAM: CT HEAD WITHOUT CONTRAST TECHNIQUE: Contiguous axial images were obtained from the base of the skull through the vertex without intravenous contrast. COMPARISON:  None. FINDINGS: Brain: There is atrophy and chronic small vessel disease changes. Bilateral basal ganglia calcifications. No acute intracranial abnormality. Specifically, no hemorrhage, hydrocephalus, mass lesion, acute infarction, or significant intracranial injury. Vascular: No hyperdense vessel or unexpected calcification. Skull: No acute calvarial abnormality. Sinuses/Orbits: No acute findings Other: None IMPRESSION: Atrophy, chronic microvascular disease. No acute intracranial abnormality. Electronically Signed   By: Rolm Baptise M.D.    On: 06/08/2021 11:04    Procedures Procedures   Medications Ordered in ED Medications  lidocaine (LIDODERM) 5 % 1 patch (has no administration in time range)  lactulose (CHRONULAC) 10 GM/15ML solution 30 g (has no administration in time range)  acetaminophen (TYLENOL) tablet 650 mg (has no administration in time range)    Or  acetaminophen (TYLENOL) suppository 650 mg (has no administration in time range)  ondansetron (ZOFRAN) tablet 4 mg (has no administration in time range)    Or  ondansetron (ZOFRAN) injection  4 mg (has no administration in time range)  sodium chloride 0.9 % bolus 500 mL (0 mLs Intravenous Stopped 05/20/2021 1224)  Tdap (BOOSTRIX) injection 0.5 mL (0.5 mLs Intramuscular Given 06/05/2021 1151)    ED Course  I have reviewed the triage vital signs and the nursing notes.  Pertinent labs & imaging results that were available during my care of the patient were reviewed by me and considered in my medical decision making (see chart for details).    MDM Rules/Calculators/A&P                           Patient presents after a fall which seems to be more of a chronic issue of difficulty with balance.  However now he is too weak to stand up in the hospital and this is not quite normal for him.  He seems to be generally weak over the last day or 2.  No clear cause.  Small BUN elevation, perhaps some dehydration is playing a role and he was given a small bolus of fluids.  However given he cannot get up I think you will need admission.  No significant trauma besides the abrasion/skin tears and he was given Tdap.  Will admit to hospitalist service.  Interrogation of his pacemaker revealed no obvious cause of dysrhythmia that would cause his fall. Final Clinical Impression(s) / ED Diagnoses Final diagnoses:  Fall, initial encounter  Minor head injury, initial encounter  Skin tear of left forearm without complication, initial encounter  Generalized weakness    Rx / DC Orders ED  Discharge Orders     None        Sherwood Gambler, MD 06/04/2021 1354    Sherwood Gambler, MD 06/04/2021 1402

## 2021-06-13 NOTE — ED Notes (Signed)
Ricky [name] [phone] would like an update

## 2021-06-14 ENCOUNTER — Inpatient Hospital Stay (HOSPITAL_COMMUNITY): Payer: Medicare HMO

## 2021-06-14 ENCOUNTER — Encounter (HOSPITAL_COMMUNITY): Payer: Self-pay | Admitting: Internal Medicine

## 2021-06-14 DIAGNOSIS — S0990XA Unspecified injury of head, initial encounter: Secondary | ICD-10-CM | POA: Diagnosis not present

## 2021-06-14 DIAGNOSIS — S51812A Laceration without foreign body of left forearm, initial encounter: Secondary | ICD-10-CM | POA: Diagnosis not present

## 2021-06-14 DIAGNOSIS — R531 Weakness: Secondary | ICD-10-CM | POA: Diagnosis not present

## 2021-06-14 DIAGNOSIS — T17908A Unspecified foreign body in respiratory tract, part unspecified causing other injury, initial encounter: Secondary | ICD-10-CM

## 2021-06-14 DIAGNOSIS — W19XXXA Unspecified fall, initial encounter: Secondary | ICD-10-CM | POA: Diagnosis not present

## 2021-06-14 LAB — CBC WITH DIFFERENTIAL/PLATELET
Abs Immature Granulocytes: 0.06 10*3/uL (ref 0.00–0.07)
Basophils Absolute: 0 10*3/uL (ref 0.0–0.1)
Basophils Relative: 0 %
Eosinophils Absolute: 0 10*3/uL (ref 0.0–0.5)
Eosinophils Relative: 0 %
HCT: 43.4 % (ref 39.0–52.0)
Hemoglobin: 14.6 g/dL (ref 13.0–17.0)
Immature Granulocytes: 0 %
Lymphocytes Relative: 6 %
Lymphs Abs: 0.9 10*3/uL (ref 0.7–4.0)
MCH: 35.3 pg — ABNORMAL HIGH (ref 26.0–34.0)
MCHC: 33.6 g/dL (ref 30.0–36.0)
MCV: 104.8 fL — ABNORMAL HIGH (ref 80.0–100.0)
Monocytes Absolute: 1.5 10*3/uL — ABNORMAL HIGH (ref 0.1–1.0)
Monocytes Relative: 10 %
Neutro Abs: 13 10*3/uL — ABNORMAL HIGH (ref 1.7–7.7)
Neutrophils Relative %: 84 %
Platelets: 197 10*3/uL (ref 150–400)
RBC: 4.14 MIL/uL — ABNORMAL LOW (ref 4.22–5.81)
RDW: 12.7 % (ref 11.5–15.5)
WBC: 15.5 10*3/uL — ABNORMAL HIGH (ref 4.0–10.5)
nRBC: 0 % (ref 0.0–0.2)

## 2021-06-14 LAB — SARS CORONAVIRUS 2 (TAT 6-24 HRS): SARS Coronavirus 2: NEGATIVE

## 2021-06-14 MED ORDER — IPRATROPIUM-ALBUTEROL 0.5-2.5 (3) MG/3ML IN SOLN: 3.0000 mL | Freq: Four times a day (QID) | RESPIRATORY_TRACT | Status: DC | PRN

## 2021-06-14 MED ORDER — APIXABAN 5 MG PO TABS
5.0000 mg | ORAL_TABLET | Freq: Two times a day (BID) | ORAL | Status: DC
  Administered 2021-06-16: 5 mg via ORAL
  Filled 2021-06-14: qty 1

## 2021-06-14 MED ORDER — CARVEDILOL 12.5 MG PO TABS
12.5000 mg | ORAL_TABLET | Freq: Two times a day (BID) | ORAL | Status: DC
  Administered 2021-06-16: 12.5 mg via ORAL
  Filled 2021-06-14: qty 1

## 2021-06-14 MED ORDER — SODIUM CHLORIDE 0.9 % IV SOLN: INTRAVENOUS | Status: AC

## 2021-06-14 NOTE — Progress Notes (Signed)
PT Cancellation Note  Patient Details Name: MOUA RASMUSSON MRN: [id number] DOB: [birth year]   Cancelled Treatment:    Reason Eval/Treat Not Completed: Other (comment).  PT arrived to attempt evaluation but pt is leaving for the floor shortly, per nsg request will see at another time.   Ramond Dial 06/14/2021, 4:12 PM  Mee Hives, PT MS Acute Rehab Dept. Number: [place] [facility] [place]

## 2021-06-14 NOTE — Progress Notes (Signed)
SLP Note  Patient Details Name: DEKKER VERGA MRN: [id number] DOB: 1923-03-29   Spoke with supportive younger son regarding results of FEES and plan for treatment. Provided education regarding management of dysphagia.  Introduced AMN options with recommendations against long term use for this pt.  Discussed considering comfort feedings.  Shared with him that attending is recommending continue IV fluids with plan for speech to see pt again tomorrow and to discuss plans for care with pt and family. Answered questions as able and directed pt to speak with team as needed regarding other areas of care including discharge planning.    Celedonio Savage, [place], [place] Office: [phone]  06/14/2021, 2:04 PM

## 2021-06-14 NOTE — ED Notes (Signed)
Speech at bedside

## 2021-06-14 NOTE — Plan of Care (Signed)

## 2021-06-14 NOTE — Progress Notes (Signed)
PROGRESS NOTE    Ricky [name]  YOV:[id number] DOB: 04-03-1923 DOA: 05/21/2021 PCP: Cassandria Anger, MD  Brief Narrative: 87 year old male with history of chronic A. fib, PPM, on Eliquis, chronic diastolic CHF, hypertension, hypothyroidism, chronic gait disorder/ambulatory dysfunction presented to the ED with generalized weakness and fall yesterday. -According to patient's son he lives with a daughter, ambulates with a walker but for the most part is somewhat independent, patient reports chronic right knee pain, bilateral feet numbness for months, feels unsteady and getting weaker over the last 1 week or so, switched from showers to sponge baths. -Yesterday reportedly fell while trying to stand up and hit his head on the monitor. -Also reports history of dysphagia, having to chew his food slowly, intermittent coughing and choking after foods especially liquids ongoing for years but worse in the last week or so. -In the ED work-up was unremarkable, CT head and labs were unrevealing -While in the ED he was noted to have an aspiration event followed by severe hypoxia   Assessment & Plan:   Aspiration pneumonia Dysphagia, acute on chronic -Likely progressive -SLP evaluation today noted severe oropharyngeal dysphagia, suspected to be chronic, likely has been compensating at baseline for a while now but progressively worsening -Will discuss with family again tomorrow -Prognosis is poor in the setting of advanced age and dysphagia, will need palliative care evaluation -I do not think he is a candidate for PEG tube  Gait disorder Ambulatory dysfunction -Progressive worsening, CT head unremarkable -PT eval  Moderate protein calorie malnutrition -Supplements as tolerated  Chronic A. Fib -Resume carvedilol and apixaban  Chronic diastolic CHF -Clinically euvolemic, hold torsemide and Aldactone  Chronic constipation -Continue lactulose  DVT prophylaxis: Eliquis Code Status:  DNR Family Communication: Son at bedside Disposition Plan:  Status is: Inpatient  Remains inpatient appropriate because:Inpatient level of care appropriate due to severity of illness  Dispo: The patient is from: Home              Anticipated d/c is to: Home              Patient currently is not medically stable to d/c.   Difficult to place patient No        Consultants:  SLP  Procedures:   Antimicrobials:    Subjective: -Overnight had respiratory distress and hypoxia  Objective: Vitals:   06/14/21 0830 06/14/21 0845 06/14/21 0945 06/14/21 1300  BP: 113/67 (!) 117/57 126/66 116/63  Pulse: 69 70 71 70  Resp: 17 13 16 17   Temp:      TempSrc:      SpO2: 98% 98% 100% 96%  Weight:      Height:        Intake/Output Summary (Last 24 hours) at 06/14/2021 1441 Last data filed at 05/26/2021 2139 Gross per 24 hour  Intake 200 ml  Output --  Net 200 ml   Filed Weights   05/18/2021 1017  Weight: 65.8 kg    Examination:  General exam: Elderly male sitting up in bed, awake alert, oriented to self and partly to place only, cognitive deficits noted HEENT: No JVD CVS: S1-S2, regular rate rhythm Lungs: Decreased breath sounds to bases, few scattered rhonchi Abdomen: Soft, nontender, bowel sounds present Extremities: No edema Skin: No rashes on exposed skin Psychiatry: Poor insight and judgment  Data Reviewed:   CBC: Recent Labs  Lab 06/05/2021 1019 06/14/21 0431  WBC 5.3 15.5*  NEUTROABS 3.2 13.0*  HGB 13.6 14.6  HCT 42.1 43.4  MCV 107.4* 104.8*  PLT 211 038   Basic Metabolic Panel: Recent Labs  Lab 05/28/2021 1019  NA 135  K 4.4  CL 100  CO2 28  GLUCOSE 92  BUN 28*  CREATININE 1.06  CALCIUM 9.2   GFR: Estimated Creatinine Clearance: 38.8 mL/min (by C-G formula based on SCr of 1.06 mg/dL). Liver Function Tests: No results for input(s): AST, ALT, ALKPHOS, BILITOT, PROT, ALBUMIN in the last 168 hours. No results for input(s): LIPASE, AMYLASE in the  last 168 hours. No results for input(s): AMMONIA in the last 168 hours. Coagulation Profile: No results for input(s): INR, PROTIME in the last 168 hours. Cardiac Enzymes: No results for input(s): CKTOTAL, CKMB, CKMBINDEX, TROPONINI in the last 168 hours. BNP (last 3 results) No results for input(s): PROBNP in the last 8760 hours. HbA1C: No results for input(s): HGBA1C in the last 72 hours. CBG: Recent Labs  Lab 05/21/2021 1025  GLUCAP 94   Lipid Profile: No results for input(s): CHOL, HDL, LDLCALC, TRIG, CHOLHDL, LDLDIRECT in the last 72 hours. Thyroid Function Tests: No results for input(s): TSH, T4TOTAL, FREET4, T3FREE, THYROIDAB in the last 72 hours. Anemia Panel: No results for input(s): VITAMINB12, FOLATE, FERRITIN, TIBC, IRON, RETICCTPCT in the last 72 hours. Urine analysis:    Component Value Date/Time   COLORURINE YELLOW 06/14/2021 1256   APPEARANCEUR HAZY (A) 06/12/2021 1256   LABSPEC 1.014 06/09/2021 1256   PHURINE 8.0 06/03/2021 1256   GLUCOSEU NEGATIVE 06/02/2021 1256   HGBUR NEGATIVE 05/24/2021 1256   [place] 05/29/2021 1256   [place] 05/19/2021 1256   PROTEINUR NEGATIVE 06/09/2021 1256   NITRITE NEGATIVE 06/15/2021 1256   LEUKOCYTESUR NEGATIVE 06/03/2021 1256   Sepsis Labs: @LABRCNTIP (procalcitonin:4,lacticidven:4)  ) Recent Results (from the past 240 hour(s))  SARS CORONAVIRUS 2 (TAT 6-24 HRS) Nasopharyngeal Nasopharyngeal Swab     Status: None   Collection Time: 05/21/2021  5:48 PM   Specimen: Nasopharyngeal Swab  Result Value Ref Range Status   SARS Coronavirus 2 NEGATIVE NEGATIVE Final    Comment: (NOTE) SARS-CoV-2 target nucleic acids are NOT DETECTED.  The SARS-CoV-2 RNA is generally detectable in upper and lower respiratory specimens during the acute phase of infection. Negative results do not preclude SARS-CoV-2 infection, do not rule out co-infections with other pathogens, and should not be used as the sole basis for  treatment or other patient management decisions. Negative results must be combined with clinical observations, patient history, and epidemiological information. The expected result is Negative.  Fact Sheet for Patients: SugarRoll.be  Fact Sheet for Healthcare Providers: https://www.woods-mathews.com/  This test is not yet approved or cleared by the [place] FDA and  has been authorized for detection and/or diagnosis of SARS-CoV-2 by FDA under an Emergency Use Authorization (EUA). This EUA will remain  in effect (meaning this test can be used) for the duration of the COVID-19 declaration under Se ction 564(b)(1) of the Act, 21 U.S.C. section 360bbb-3(b)(1), unless the authorization is terminated or revoked sooner.  Performed at [facility], [place] [address], [place], [place] [address]          Radiology Studies: DG Chest 1 View  Result Date: 05/30/2021 CLINICAL DATA:  Aspiration EXAM: CHEST  1 VIEW COMPARISON:  05/30/2021 FINDINGS: Single frontal view of the chest demonstrates stable dual lead pacer. Cardiac silhouette is unremarkable. Small bilateral pleural effusions are again identified. No acute airspace disease or pneumothorax. No acute bony abnormalities. IMPRESSION: 1. Stable small bilateral pleural effusions. No acute  airspace disease. Electronically Signed   By: Randa Ngo M.D.   On: 06/14/2021 20:29   DG Chest 2 View  Result Date: 05/22/2021 CLINICAL DATA:  Weakness, fall EXAM: CHEST - 2 VIEW COMPARISON:  None. FINDINGS: Right pacer in place with leads in the right atrium and right ventricle. Heart is normal size. Biapical and bibasilar scarring. Small bilateral effusions suspected. No acute confluent opacities. IMPRESSION: Small bilateral effusions. Chronic changes in the lungs. Electronically Signed   By: Rolm Baptise M.D.   On: 06/06/2021 11:38   DG Forearm Left  Result Date: 06/05/2021 CLINICAL DATA:  Fall  forearm pain EXAM: LEFT FOREARM - 2 VIEW COMPARISON:  None. FINDINGS: There is no evidence of fracture or other focal bone lesions. Soft tissues are unremarkable. IMPRESSION: Negative. Electronically Signed   By: Rolm Baptise M.D.   On: 06/09/2021 11:38   CT HEAD WO CONTRAST (5MM)  Result Date: 06/02/2021 CLINICAL DATA:  Level 2 trauma, fall.  On blood thinners. EXAM: CT HEAD WITHOUT CONTRAST TECHNIQUE: Contiguous axial images were obtained from the base of the skull through the vertex without intravenous contrast. COMPARISON:  None. FINDINGS: Brain: There is atrophy and chronic small vessel disease changes. Bilateral basal ganglia calcifications. No acute intracranial abnormality. Specifically, no hemorrhage, hydrocephalus, mass lesion, acute infarction, or significant intracranial injury. Vascular: No hyperdense vessel or unexpected calcification. Skull: No acute calvarial abnormality. Sinuses/Orbits: No acute findings Other: None IMPRESSION: Atrophy, chronic microvascular disease. No acute intracranial abnormality. Electronically Signed   By: Rolm Baptise M.D.   On: 05/29/2021 11:04   CT CERVICAL SPINE WO CONTRAST  Result Date: 06/02/2021 CLINICAL DATA:  Chronic neck pain EXAM: CT CERVICAL SPINE WITHOUT CONTRAST TECHNIQUE: Multidetector CT imaging of the cervical spine was performed without intravenous contrast. Multiplanar CT image reconstructions were also generated. COMPARISON:  None. FINDINGS: Alignment: Alignment is anatomic. Skull base and vertebrae: No acute fracture. No primary bone lesion or focal pathologic process. Soft tissues and spinal canal: No prevertebral fluid or swelling. No visible canal hematoma. Disc levels: There is diffuse spondylosis, most pronounced at C5-6 and C6-7. Mild diffuse facet hypertrophy. This results in bilateral neural foraminal encroachment from C3-4 through C6-7, most pronounced at C5-6 and C6-7. Upper chest: Central airway is patent. Biapical pleural and  parenchymal scarring. Other: Reconstructed images demonstrate no additional findings. IMPRESSION: 1. Extensive multilevel spondylosis and facet hypertrophy as above, greatest at C5-6 and C6-7. 2. No acute cervical spine fracture. Electronically Signed   By: Randa Ngo M.D.   On: 06/16/2021 16:49   DG Chest Port 1 View  Result Date: 06/14/2021 CLINICAL DATA:  Aspiration into airway. EXAM: PORTABLE CHEST 1 VIEW COMPARISON:  Yesterday FINDINGS: New airspace disease in the left mid to lower chest. Stable small pleural effusions. Stable normal heart size. Dual-chamber pacer from the right with looping of the ventricular lead. IMPRESSION: New infiltrate in the left lung correlating with history of aspiration. Electronically Signed   By: Jorje Guild M.D.   On: 06/14/2021 07:08        Scheduled Meds:  heparin injection (subcutaneous)  5,000 Units Subcutaneous Q12H   levothyroxine  75 mcg Oral Q0600   lidocaine  1 patch Transdermal Q24H   Continuous Infusions:  sodium chloride 100 mL/hr at 05/30/2021 2139   cefTRIAXone (ROCEPHIN)  IV Stopped (06/04/2021 2134)     LOS: 1 day    Time spent: 38min  Domenic Polite, MD [facility]   06/14/2021, 2:41 PM

## 2021-06-14 NOTE — Procedures (Signed)
Objective Swallowing Evaluation: Type of Study: FEES-Fiberoptic Endoscopic Evaluation of Swallow   Patient Details  Name: Ricky [name] MRN: [id number] Date of Birth: 1923/01/14  Today's Date: 06/14/2021 Time: SLP Start Time (ACUTE ONLY): 1110 -SLP Stop Time (ACUTE ONLY): 1145  SLP Time Calculation (min) (ACUTE ONLY): 35 min   Past Medical History: No past medical history on file. Past Surgical History:The histories are not reviewed yet. Please review them in the "History" navigator section and refresh this [place]. HPI: Ricky [name] is a 87 y.o. male who presented with worsening of generalized weakness, fall this morning.  Pt has had recent weightloss, amount not specified.  During this admission, possible aspiration event observed with CXR confirming "new infiltrate in the left lung correlating with history of aspiration."  Pt reports some dysphagia PTA and must wait a few minutes to an hour for symptoms to resolve before eating again.  Pt endorses both sticking sensation and things going down the wrong way.  Pt with medical history significant of chronic A. fib status post PPM, on Eliquis, HTN, chronic diastolic CHF, hypothyroidism, chronic ambulation dysfunction.   Subjective: Pt awake, alert, pleasant.    Assessment / Plan / Recommendation  CHL IP CLINICAL IMPRESSIONS 06/14/2021  Clinical Impression Pt presents with moderate-severe oropharyngeal dysphagia c/b delayed swallow initiation, reduced base of tongue retraction, incomplete laryngeal closure, suspected reduced pharyngeal constriction and hyolaryngeal elevation and excursion, and diminished sensation.  These deficits resulted in deep penetration and silent aspiration of all consistencies of liquid and significant pharyngeal residue with all consistencies trialed which increased with viscosity of bolus. There was deep penetration of thin liquid by spoon prior to the swallow nearly to the level of the vocal folds with  suspected subsequent aspiration.  With nectar thick liquid by cup and spoon there was shallow penetration prior to the swallow with aspiration during swallow.  Nectar thick liquid was seen on anterior tracheal wall.  There was aspiration of honey thick liquid by cup. With honey thick liquid by spoon there was penetration after the swallow which reached to at least the level of the vocal folds.  Repositioning of pt's head with instructions for chin tuck resulted in pt bringing head to neutral position, which decreased amount and depth of penetration, but did not eliminate it.  With small amounts of thickened liquid by spoon in this position, there was no penetration on initial swallow, but there was post prandial penetration of residuals over the aryepiglotic folds and in interarytenoid space with progression of penetration to at least the level of the vocal folds.  Cued cough did not clear penetration.  Pt had infrequent reflexive cough which was not effective to clear.  Spoon presentation v cup penetration reduced but did not prevent penetration or aspiration.  Puree was not directly observed to penetrate; however, there was a significant amount of diffuse pharyngeal residue which pt was unable to clear with subsequent swallows.  Liquid was reduced amount of puree residuals, but it is suspected that the puree mixed with liquids and was aspirated during those trials.  There is no safe oral diet for pt at present, and it is suspected that these deficits are chronic in nature, although swallowing impairments may be exacerbated by pt's overall decline in status.     Recommend pt remain NPO. Consider conversation about goals of care vs. alternate means of nutrition.  SLP Visit Diagnosis Dysphagia, oropharyngeal phase (R13.12)  Attention and concentration deficit following --  Frontal lobe and executive function  deficit following --  Impact on safety and function Severe aspiration risk      CHL IP TREATMENT  RECOMMENDATION 06/14/2021  Treatment Recommendations Therapy as outlined in treatment plan below     Prognosis 06/14/2021  Prognosis for Safe Diet Advancement Fair  Barriers to Reach Goals Severity of deficits  Barriers/Prognosis Comment --    CHL IP DIET RECOMMENDATION 06/14/2021  SLP Diet Recommendations NPO  Liquid Administration via --  Medication Administration Via alternative means  Compensations --  Postural Changes --      CHL IP OTHER RECOMMENDATIONS 06/14/2021  Recommended Consults --  Oral Care Recommendations Oral care QID  Other Recommendations --      CHL IP FOLLOW UP RECOMMENDATIONS 06/14/2021  Follow up Recommendations (No Data)      CHL IP FREQUENCY AND DURATION 06/14/2021  Speech Therapy Frequency (ACUTE ONLY) min 2x/week  Treatment Duration 2 weeks           CHL IP ORAL PHASE 06/14/2021  Oral Phase Impaired  Oral - Pudding Teaspoon --  Oral - Pudding Cup --  Oral - Honey Teaspoon Premature spillage  Oral - Honey Cup Premature spillage  Oral - Nectar Teaspoon Premature spillage  Oral - Nectar Cup Premature spillage  Oral - Nectar Straw --  Oral - Thin Teaspoon Premature spillage  Oral - Thin Cup --  Oral - Thin Straw --  Oral - Puree WFL  Oral - Mech Soft --  Oral - Regular --  Oral - Multi-Consistency --  Oral - Pill --  Oral Phase - Comment --    CHL IP PHARYNGEAL PHASE 06/14/2021  Pharyngeal Phase Impaired  Pharyngeal- Pudding Teaspoon --  Pharyngeal --  Pharyngeal- Pudding Cup --  Pharyngeal --  Pharyngeal- Honey Teaspoon Delayed swallow initiation-pyriform sinuses;Reduced airway/laryngeal closure;Reduced tongue base retraction;Penetration/Apiration after swallow;Pharyngeal residue - pyriform;Trace aspiration;Pharyngeal residue - cp segment;Inter-arytenoid space residue;Lateral channel residue;Pharyngeal residue - valleculae  Pharyngeal Material enters airway, passes BELOW cords without attempt by patient to eject out (silent  aspiration);Material enters airway, CONTACTS cords and not ejected out  Pharyngeal- Honey Cup Delayed swallow initiation-pyriform sinuses;Reduced laryngeal elevation;Reduced airway/laryngeal closure;Reduced tongue base retraction;Penetration/Aspiration before swallow;Pharyngeal residue - valleculae;Pharyngeal residue - pyriform;Pharyngeal residue - cp segment;Inter-arytenoid space residue;Lateral channel residue  Pharyngeal Material enters airway, passes BELOW cords without attempt by patient to eject out (silent aspiration)  Pharyngeal- Nectar Teaspoon Delayed swallow initiation-vallecula;Reduced airway/laryngeal closure;Reduced tongue base retraction;Penetration/Aspiration before swallow;Pharyngeal residue - valleculae;Pharyngeal residue - pyriform;Pharyngeal residue - cp segment;Inter-arytenoid space residue;Lateral channel residue  Pharyngeal Material enters airway, passes BELOW cords without attempt by patient to eject out (silent aspiration)  Pharyngeal- Nectar Cup --  Pharyngeal --  Pharyngeal- Nectar Straw --  Pharyngeal --  Pharyngeal- Thin Teaspoon Penetration/Aspiration before swallow;Pharyngeal residue - pyriform;Pharyngeal residue - cp segment;Inter-arytenoid space residue;Pharyngeal residue - valleculae;Lateral channel residue;Reduced airway/laryngeal closure;Reduced tongue base retraction  Pharyngeal Material enters airway, CONTACTS cords and not ejected out  Pharyngeal- Thin Cup --  Pharyngeal --  Pharyngeal- Thin Straw --  Pharyngeal --  Pharyngeal- Puree Delayed swallow initiation-vallecula;Reduced pharyngeal peristalsis;Reduced airway/laryngeal closure;Reduced tongue base retraction;Penetration/Apiration after swallow;Pharyngeal residue - valleculae;Pharyngeal residue - pyriform;Pharyngeal residue - posterior pharnyx;Pharyngeal residue - cp segment;Inter-arytenoid space residue;Lateral channel residue  Pharyngeal Material enters airway, remains ABOVE vocal cords and not ejected out   Pharyngeal- Mechanical Soft --  Pharyngeal --  Pharyngeal- Regular --  Pharyngeal --  Pharyngeal- Multi-consistency --  Pharyngeal --  Pharyngeal- Pill --  Pharyngeal --  Pharyngeal Comment --  CHL IP CERVICAL ESOPHAGEAL PHASE 06/14/2021  Cervical Esophageal Phase Impaired  Pudding Teaspoon --  Pudding Cup --  Honey Teaspoon --  Honey Cup --  Nectar Teaspoon --  Nectar Cup --  Nectar Straw --  Thin Teaspoon --  Thin Cup --  Thin Straw --  Puree --  Mechanical Soft --  Regular --  Multi-consistency --  Pill --  Cervical Esophageal Comment --     Celedonio Savage, MA, CCC-SLP Acute Rehabilitation Services Office: [phone]  06/14/2021, 12:12 PM

## 2021-06-14 NOTE — Evaluation (Signed)
Clinical/Bedside Swallow Evaluation Patient Details  Name: Ricky [name] MRN: [id number] Date of Birth: 1923/09/17  Today's Date: 06/14/2021 Time: SLP Start Time (ACUTE ONLY): 4782 SLP Stop Time (ACUTE ONLY): 0948 SLP Time Calculation (min) (ACUTE ONLY): 10 min  Past Medical History: No past medical history on file. Past Surgical History: The histories are not reviewed yet. Please review them in the "History" navigator section and refresh this [place]. HPI:  Ricky [name] is a 87 y.o. male who presented with worsening of generalized weakness, fall this morning.  Pt has had recent weightloss, amount not specified.  During this admission, possible aspiration event observed with CXR confirming "new infiltrate in the left lung correlating with history of aspiration."  Pt reports some dysphagia PTA and must wait a few minutes to an hour for symptoms to resolve before eating again.  Pt endorses both sticking sensation and things going down the wrong way.  Pt with medical history significant of chronic A. fib status post PPM, on Eliquis, HTN, chronic diastolic CHF, hypothyroidism, chronic ambulation dysfunction.    Assessment / Plan / Recommendation  Clinical Impression  Pt presents with clinical indicators of pharyngeal dysphagia.  There were multiple effortful swallows (5-6) with both thin and puree consistencies.  Pt with recent weightloss and son believe he is not eating well d/t symptoms of dysphagia at home, which has contributed to generalized weakness.  CXR confirmed aspiration event during this admission.  Recommend instrumental evaluation prior to initiation of PO diet.  Pt declined MBSS but is agreeable to FEES which will complete later this date.  Recommend pt remain NPO pending results of FEES.  SLP Visit Diagnosis: Dysphagia, unspecified (R13.10)    Aspiration Risk  Moderate aspiration risk    Diet Recommendation NPO   Medication Administration: Via alternative means     Other  Recommendations      Recommendations for follow up therapy are one component of a multi-disciplinary discharge planning process, led by the attending physician.  Recommendations may be updated based on patient status, additional functional criteria and insurance authorization.  Follow up Recommendations  (TBD)      Frequency and Duration  (TBD)          Prognosis Prognosis for Safe Diet Advancement:  (TBD)      Swallow Study   General Date of Onset: 05/22/2021 HPI: Ricky [name] is a 87 y.o. male who presented with worsening of generalized weakness, fall this morning.  Pt has had recent weightloss, amount not specified.  During this admission, possible aspiration event observed with CXR confirming "new infiltrate in the left lung correlating with history of aspiration."  Pt reports some dysphagia PTA and must wait a few minutes to an hour for symptoms to resolve before eating again.  Pt endorses both sticking sensation and things going down the wrong way.  Pt with medical history significant of chronic A. fib status post PPM, on Eliquis, HTN, chronic diastolic CHF, hypothyroidism, chronic ambulation dysfunction. Type of Study: Bedside Swallow Evaluation Diet Prior to this Study: NPO Temperature Spikes Noted: No Respiratory Status: Nasal cannula History of Recent Intubation: No Behavior/Cognition: Alert;Cooperative;Pleasant mood Oral Cavity Assessment: Within Functional Limits Oral Care Completed by SLP: No Oral Cavity - Dentition: Adequate natural dentition Self-Feeding Abilities: Needs assist Patient Positioning: Upright in bed Baseline Vocal Quality: Normal Volitional Cough: Weak Volitional Swallow: Able to elicit    Oral/Motor/Sensory Function Overall Oral Motor/Sensory Function: Mild impairment Facial Symmetry: Abnormal symmetry right Lingual Symmetry: Within Functional Limits  Lingual Strength: Reduced Velum: Within Functional Limits Mandible: Within Functional  Limits   Ice Chips Ice chips: Not tested   Thin Liquid Thin Liquid: Impaired Presentation: Cup Pharyngeal  Phase Impairments: Multiple swallows;Decreased hyoid-laryngeal movement    Nectar Thick Nectar Thick Liquid: Not tested   Honey Thick Honey Thick Liquid: Not tested   Puree Puree: Impaired Pharyngeal Phase Impairments: Decreased hyoid-laryngeal movement;Multiple swallows   Solid     Solid: Not tested      Ricky Savage, MA, [place] Office: [phone] 06/14/2021,10:24 AM

## 2021-06-15 DIAGNOSIS — S51812A Laceration without foreign body of left forearm, initial encounter: Secondary | ICD-10-CM | POA: Diagnosis not present

## 2021-06-15 DIAGNOSIS — S0990XA Unspecified injury of head, initial encounter: Secondary | ICD-10-CM | POA: Diagnosis not present

## 2021-06-15 DIAGNOSIS — R531 Weakness: Secondary | ICD-10-CM | POA: Diagnosis not present

## 2021-06-15 DIAGNOSIS — E440 Moderate protein-calorie malnutrition: Secondary | ICD-10-CM | POA: Insufficient documentation

## 2021-06-15 DIAGNOSIS — W19XXXA Unspecified fall, initial encounter: Secondary | ICD-10-CM | POA: Diagnosis not present

## 2021-06-15 LAB — BASIC METABOLIC PANEL
Anion gap: 9 (ref 5–15)
BUN: 24 mg/dL — ABNORMAL HIGH (ref 8–23)
CO2: 24 mmol/L (ref 22–32)
Calcium: 8.6 mg/dL — ABNORMAL LOW (ref 8.9–10.3)
Chloride: 106 mmol/L (ref 98–111)
Creatinine, Ser: 0.9 mg/dL (ref 0.61–1.24)
GFR, Estimated: >60 mL/min (ref >60)
Glucose, Bld: 104 mg/dL — ABNORMAL HIGH (ref 70–99)
Potassium: 3.8 mmol/L (ref 3.5–5.1)
Sodium: 139 mmol/L (ref 135–145)

## 2021-06-15 LAB — CBC
HCT: 40.1 % (ref 39.0–52.0)
Hemoglobin: 13.4 g/dL (ref 13.0–17.0)
MCH: 34.9 pg — ABNORMAL HIGH (ref 26.0–34.0)
MCHC: 33.4 g/dL (ref 30.0–36.0)
MCV: 104.4 fL — ABNORMAL HIGH (ref 80.0–100.0)
Platelets: 186 10*3/uL (ref 150–400)
RBC: 3.84 MIL/uL — ABNORMAL LOW (ref 4.22–5.81)
RDW: 12.9 % (ref 11.5–15.5)
WBC: 9.9 10*3/uL (ref 4.0–10.5)
nRBC: 0 % (ref 0.0–0.2)

## 2021-06-15 MED ORDER — ADULT MULTIVITAMIN W/MINERALS CH
1.0000 | ORAL_TABLET | Freq: Every day | ORAL | Status: DC
  Administered 2021-06-16: 1 via ORAL
  Filled 2021-06-15: qty 1

## 2021-06-15 MED ORDER — ENSURE ENLIVE PO LIQD
237.0000 mL | Freq: Two times a day (BID) | ORAL | Status: DC
  Administered 2021-06-16: 237 mL via ORAL

## 2021-06-15 NOTE — Progress Notes (Signed)
Pt combative and verbally aggressive to this RN when doing patient care. Pt told RN "Get out of here!" Pt also reportedly seeing a white and gray dog in the room and told RN "Get a shotgun and kill it!". Will continue to monitor pt.

## 2021-06-15 NOTE — Progress Notes (Signed)
PROGRESS NOTE    Ricky [name]  SJG:[id number] DOB: 1923/01/08 DOA: 05/21/2021 PCP: Cassandria Anger, MD  Brief Narrative: 87 year old male with history of chronic A. fib, PPM, on Eliquis, chronic diastolic CHF, hypertension, hypothyroidism, chronic gait disorder/ambulatory dysfunction presented to the ED with generalized weakness and fall yesterday. -According to patient's son he lives with a daughter, ambulates with a walker but for the most part is somewhat independent, patient reports chronic right knee pain, bilateral feet numbness for months, feels unsteady and getting weaker over the last 1 week or so, switched from showers to sponge baths. -9/27 reportedly fell while trying to stand up and hit his head on the monitor. -Also reports history of dysphagia, having to chew his food slowly, intermittent coughing and choking after foods especially liquids ongoing for years but worse in the last week or so. -In the ED work-up was unremarkable, CT head and labs were unrevealing -While in the ED he was noted to have an aspiration event followed by severe hypoxia   Assessment & Plan:   Aspiration pneumonia Dysphagia, acute on chronic -SLP evaluation 9/28 noted severe oropharyngeal dysphagia, suspected to be chronic, likely has been compensating at baseline for a while now but progressively worsening -Discussed poor prognosis with family, given advanced age and frailty, would recommend comfort feeds understanding risks of aspiration, I do not think he is a candidate for PEG tube, discussed this with patient's son -Palliative care consulted for goals of care -Attempt dysphagia diet today as tolerated -Multiple antibiotic allergies noted, continue IV ceftriaxone for 5 to 7 days  Gait disorder Ambulatory dysfunction -Progressive worsening, CT head unremarkable -PT eval completed, assessment was limited, SNF recommended  Moderate protein calorie malnutrition -Supplements as  tolerated  Chronic A. Fib -Continue carvedilol and apixaban  Chronic diastolic CHF -Clinically euvolemic, hold torsemide and Aldactone  Chronic constipation -Continue lactulose  DVT prophylaxis: Eliquis Code Status: DNR Family Communication: Son at bedside Disposition Plan:  Status is: Inpatient  Remains inpatient appropriate because:Inpatient level of care appropriate due to severity of illness  Dispo: The patient is from: Home              Anticipated d/c is to: Home versus long-term care              Patient currently is not medically stable to d/c.   Difficult to place patient No        Consultants:  SLP  Procedures:   Antimicrobials:    Subjective: -Overnight had increased agitation and hallucinations  Objective: Vitals:   06/14/21 2123 06/14/21 2124 06/14/21 2220 06/15/21 0736  BP:  (!) 164/77 (!) 144/73 (!) 157/79  Pulse:  70 70 73  Resp:  18    Temp: 97.9 F (36.6 C)  98.3 F (36.8 C) 97.6 F (36.4 C)  TempSrc: Oral  Oral Oral  SpO2:  98%  94%  Weight:      Height:        Intake/Output Summary (Last 24 hours) at 06/15/2021 1322 Last data filed at 06/15/2021 0630 Gross per 24 hour  Intake 802.74 ml  Output 300 ml  Net 502.74 ml   Filed Weights   05/31/2021 1017  Weight: 65.8 kg    Examination:  General exam: Elderly male laying in bed, awake alert, oriented to self only, cognitive deficits noted CVS: S1-S2, regular rate rhythm Lungs: Decreased breath sounds at the bases, few scattered rhonchi Abdomen: Soft, nontender, bowel sounds present Extremities: No edema  Skin: No  rashes on exposed skin Psychiatry: Poor insight and judgment  Data Reviewed:   CBC: Recent Labs  Lab 05/31/2021 1019 06/14/21 0431 06/15/21 0213  WBC 5.3 15.5* 9.9  NEUTROABS 3.2 13.0*  --   HGB 13.6 14.6 13.4  HCT 42.1 43.4 40.1  MCV 107.4* 104.8* 104.4*  PLT 211 197 322   Basic Metabolic Panel: Recent Labs  Lab 06/05/2021 1019 06/15/21 0213  NA 135 139   K 4.4 3.8  CL 100 106  CO2 28 24  GLUCOSE 92 104*  BUN 28* 24*  CREATININE 1.06 0.90  CALCIUM 9.2 8.6*   GFR: Estimated Creatinine Clearance: 45.7 mL/min (by C-G formula based on SCr of 0.9 mg/dL). Liver Function Tests: No results for input(s): AST, ALT, ALKPHOS, BILITOT, PROT, ALBUMIN in the last 168 hours. No results for input(s): LIPASE, AMYLASE in the last 168 hours. No results for input(s): AMMONIA in the last 168 hours. Coagulation Profile: No results for input(s): INR, PROTIME in the last 168 hours. Cardiac Enzymes: No results for input(s): CKTOTAL, CKMB, CKMBINDEX, TROPONINI in the last 168 hours. BNP (last 3 results) No results for input(s): PROBNP in the last 8760 hours. HbA1C: No results for input(s): HGBA1C in the last 72 hours. CBG: Recent Labs  Lab 06/16/2021 1025  GLUCAP 94   Lipid Profile: No results for input(s): CHOL, HDL, LDLCALC, TRIG, CHOLHDL, LDLDIRECT in the last 72 hours. Thyroid Function Tests: No results for input(s): TSH, T4TOTAL, FREET4, T3FREE, THYROIDAB in the last 72 hours. Anemia Panel: No results for input(s): VITAMINB12, FOLATE, FERRITIN, TIBC, IRON, RETICCTPCT in the last 72 hours. Urine analysis:    Component Value Date/Time   COLORURINE YELLOW 05/27/2021 1256   APPEARANCEUR HAZY (A) 05/24/2021 1256   LABSPEC 1.014 05/31/2021 1256   PHURINE 8.0 05/18/2021 1256   GLUCOSEU NEGATIVE 06/05/2021 1256   HGBUR NEGATIVE 06/14/2021 1256   [place] 05/21/2021 1256   KETONESUR NEGATIVE 05/21/2021 1256   PROTEINUR NEGATIVE 05/28/2021 1256   NITRITE NEGATIVE 06/12/2021 1256   LEUKOCYTESUR NEGATIVE 05/25/2021 1256   Sepsis Labs: @LABRCNTIP (procalcitonin:4,lacticidven:4)  ) Recent Results (from the past 240 hour(s))  SARS CORONAVIRUS 2 (TAT 6-24 HRS) Nasopharyngeal Nasopharyngeal Swab     Status: None   Collection Time: 06/10/2021  5:48 PM   Specimen: Nasopharyngeal Swab  Result Value Ref Range Status   SARS Coronavirus 2  NEGATIVE NEGATIVE Final    Comment: (NOTE) SARS-CoV-2 target nucleic acids are NOT DETECTED.  The SARS-CoV-2 RNA is generally detectable in upper and lower respiratory specimens during the acute phase of infection. Negative results do not preclude SARS-CoV-2 infection, do not rule out co-infections with other pathogens, and should not be used as the sole basis for treatment or other patient management decisions. Negative results must be combined with clinical observations, patient history, and epidemiological information. The expected result is Negative.  Fact Sheet for Patients: SugarRoll.be  Fact Sheet for Healthcare Providers: https://www.woods-mathews.com/  This test is not yet approved or cleared by the [place] FDA and  has been authorized for detection and/or diagnosis of SARS-CoV-2 by FDA under an Emergency Use Authorization (EUA). This EUA will remain  in effect (meaning this test can be used) for the duration of the COVID-19 declaration under Se ction 564(b)(1) of the Act, 21 U.S.C. section 360bbb-3(b)(1), unless the authorization is terminated or revoked sooner.  Performed at [facility], [place] [address], [place], [place] [address]          Radiology Studies: DG Chest  1 View  Result Date: 06/14/2021 CLINICAL DATA:  Aspiration EXAM: CHEST  1 VIEW COMPARISON:  06/14/2021 FINDINGS: Single frontal view of the chest demonstrates stable dual lead pacer. Cardiac silhouette is unremarkable. Small bilateral pleural effusions are again identified. No acute airspace disease or pneumothorax. No acute bony abnormalities. IMPRESSION: 1. Stable small bilateral pleural effusions. No acute airspace disease. Electronically Signed   By: Randa Ngo M.D.   On: 06/02/2021 20:29   CT CERVICAL SPINE WO CONTRAST  Result Date: 06/01/2021 CLINICAL DATA:  Chronic neck pain EXAM: CT CERVICAL SPINE WITHOUT CONTRAST TECHNIQUE:  Multidetector CT imaging of the cervical spine was performed without intravenous contrast. Multiplanar CT image reconstructions were also generated. COMPARISON:  None. FINDINGS: Alignment: Alignment is anatomic. Skull base and vertebrae: No acute fracture. No primary bone lesion or focal pathologic process. Soft tissues and spinal canal: No prevertebral fluid or swelling. No visible canal hematoma. Disc levels: There is diffuse spondylosis, most pronounced at C5-6 and C6-7. Mild diffuse facet hypertrophy. This results in bilateral neural foraminal encroachment from C3-4 through C6-7, most pronounced at C5-6 and C6-7. Upper chest: Central airway is patent. Biapical pleural and parenchymal scarring. Other: Reconstructed images demonstrate no additional findings. IMPRESSION: 1. Extensive multilevel spondylosis and facet hypertrophy as above, greatest at C5-6 and C6-7. 2. No acute cervical spine fracture. Electronically Signed   By: Randa Ngo M.D.   On: 06/09/2021 16:49   DG Chest Port 1 View  Result Date: 06/14/2021 CLINICAL DATA:  Aspiration into airway. EXAM: PORTABLE CHEST 1 VIEW COMPARISON:  Yesterday FINDINGS: New airspace disease in the left mid to lower chest. Stable small pleural effusions. Stable normal heart size. Dual-chamber pacer from the right with looping of the ventricular lead. IMPRESSION: New infiltrate in the left lung correlating with history of aspiration. Electronically Signed   By: Jorje Guild M.D.   On: 06/14/2021 07:08        Scheduled Meds:  apixaban  5 mg Oral BID   carvedilol  12.5 mg Oral BID WC   levothyroxine  75 mcg Oral Q0600   lidocaine  1 patch Transdermal Q24H   Continuous Infusions:  sodium chloride 75 mL/hr at 06/14/21 1725   cefTRIAXone (ROCEPHIN)  IV 2 g (06/14/21 1827)     LOS: 2 days    Time spent: 68min  Domenic Polite, MD [facility]   06/15/2021, 1:22 PM

## 2021-06-15 NOTE — Evaluation (Signed)
Physical Therapy Evaluation Patient Details Name: Ricky [name] MRN: [id number] DOB: 1922-12-18 Today's Date: 06/15/2021  History of Present Illness  Pt is a 87 y.o. male admitted 05/19/2021 as level 2 trauma after fall at home hitting his head; family also reports progressive weakness. Pt with abrasions to L forearm. Head CT negative for acute injury; chronic microvascular disease. No PMH on file.  Clinical Impression   Pt admitted with above diagnosis. Lives at home with his daughter in a house with 2 steps to enter; able to live on main level, using RW for amb, but until very recently independent with bathing, dressing, cooking;  Presents to PT with cognitive deficits, difficulty participating, generalized weakness -- all of which come together to add up to significant functional dependencies; Pt currently with functional limitations due to the deficits listed below (see PT Problem List). Pt will benefit from skilled PT to increase their independence and safety with mobility to allow discharge to the venue listed below.          Recommendations for follow up therapy are one component of a multi-disciplinary discharge planning process, led by the attending physician.  Recommendations may be updated based on patient status, additional functional criteria and insurance authorization.  Follow Up Recommendations SNF    Equipment Recommendations  3in1 (PT);Wheelchair (measurements PT);Wheelchair cushion (measurements PT);Other (comment) (Definitive DME recs will be better made at [facility])    Recommendations for Other Services       Precautions / Restrictions Precautions Precautions: Fall      Mobility  Bed Mobility Overal bed mobility: Needs Assistance Bed Mobility: Rolling;Supine to Sit;Sit to Supine Rolling: Mod assist   Supine to sit: Mod assist;+2 for physical assistance (Heavy mod) Sit to supine: Max assist;+2 for physical assistance   General bed mobility comments: Heavy mod  assist to sit up to EOB on L side; able to shift/scoot so that both feet touched the floor with cues; once up approx 5 seconds, pt stated, "this is for the birds" and made efforts to lay back down; Pt identified difficulty with getting his LEs back onto the bed and requested assist appropriately; Diffiulty with rolling and needing mod assist, but it seems this was more likely a result of pt not fully understanding why he needed to roll (to get wet pads out from under him)    Transfers                    Ambulation/Gait                Stairs            Wheelchair Mobility    Modified Rankin (Stroke Patients Only)       Balance                                             Pertinent Vitals/Pain Pain Assessment: No/denies pain Faces Pain Scale: Hurts a little bit Pain Location: Generalized; grimace with moving to sitting up on EOB -- possibly related to effort Pain Descriptors / Indicators: Grimacing Pain Intervention(s): Repositioned    Home Living Family/patient expects to be discharged to:: Private residence Living Arrangements: Children Available Help at Discharge: Family Type of Home: House Home Access: Stairs to enter Entrance Stairs-Rails: Right Entrance Stairs-Number of Steps: 2 Home Layout: Able to live on main level with bedroom/bathroom Home Equipment:  Walker - 2 wheels      Prior Function Level of Independence: Independent with assistive device(s);Needs assistance   Gait / Transfers Assistance Needed: Independent with RW until recently; Day prior to admission pt's duaghter noted needing a lot of help to rise from sitting to standign  ADL's / Homemaking Assistance Needed: No assist needed with bathing and dressing until recently  Comments: Pt's son, [name] reports quite a precipitous functional decline     Hand Dominance        Extremity/Trunk Assessment   Upper Extremity Assessment Upper Extremity Assessment:  Difficult to assess due to impaired cognition (Held his covers tightly; through out session; unable to get a chance to assess ROM of UEs)    Lower Extremity Assessment Lower Extremity Assessment: Generalized weakness;Difficult to assess due to impaired cognition (ROM hips, knees, ankles adequate for sitting EOB; generalized weakness, needing assist to lift LEs onto bed against gravity)       Communication   Communication: HOH  Cognition Arousal/Alertness: Awake/alert Behavior During Therapy: Restless Overall Cognitive Status: Impaired/Different from baseline Area of Impairment: Orientation;Attention;Memory;Following commands;Safety/judgement;Awareness;Problem solving                 Orientation Level: Place;Time;Situation   Memory: Decreased short-term memory (amnestic to fall) Following Commands: Follows one step commands inconsistently Safety/Judgement: Decreased awareness of safety;Decreased awareness of deficits   Problem Solving: Decreased initiation;Difficulty sequencing;Requires tactile cues General Comments: Benefits from having some input in how to get up ("what side of bed?", instead of "do you want to get up?"); Son tells me he is normally not combative and oppositional      General Comments General comments (skin integrity, edema, etc.): Son, [name], present and helpful; Pt tended to listen and interact more with [name], and so asked [name] to repeat instructions/cues directly to pt; incontinent of urine; might be too restless to effectively use a Higher education careers adviser     Assessment/Plan    PT Assessment Patient needs continued PT services  PT Problem List Decreased strength;Decreased activity tolerance;Decreased balance;Decreased mobility;Decreased coordination;Decreased cognition;Decreased knowledge of use of DME;Decreased knowledge of precautions;Decreased safety awareness;Decreased range of motion;Impaired sensation       PT Treatment Interventions DME  instruction;Gait training;Functional mobility training;Therapeutic activities;Therapeutic exercise;Balance training;Neuromuscular re-education;Cognitive remediation;Patient/family education    PT Goals (Current goals can be found in the Care Plan section)  Acute Rehab PT Goals Patient Stated Goal: to lay back down after sitting EOB PT Goal Formulation: Patient unable to participate in goal setting Time For Goal Achievement: 06/29/21 Potential to Achieve Goals: Good    Frequency Min 2X/week   Barriers to discharge        Co-evaluation               AM-PAC PT "6 Clicks" Mobility  Outcome Measure Help needed turning from your back to your side while in a flat bed without using bedrails?: A Lot Help needed moving from lying on your back to sitting on the side of a flat bed without using bedrails?: A Lot Help needed moving to and from a bed to a chair (including a wheelchair)?: A Lot Help needed standing up from a chair using your arms (e.g., wheelchair or bedside chair)?: A Lot Help needed to walk in hospital room?: Total Help needed climbing 3-5 steps with a railing? : Total 6 Click Score: 10    End of Session Equipment Utilized During Treatment: Other (comment) (bed paS) Activity Tolerance: Patient tolerated treatment well;Other (comment) (  though not wanting to sit up much) Patient left: in bed;with call bell/phone within reach;with bed alarm set Nurse Communication: Mobility status;Other (comment) (incontinent) PT Visit Diagnosis: Unsteadiness on feet (R26.81);Muscle weakness (generalized) (M62.81);Other symptoms and signs involving the nervous system (R29.898)    Time: 2763-9432 PT Time Calculation (min) (ACUTE ONLY): 24 min   Charges:   PT Evaluation $PT Eval Moderate Complexity: 1 Mod PT Treatments $Therapeutic Activity: 8-22 mins        Roney Marion, PT  Acute Rehabilitation Services Pager [phone] Office [phone]   Colletta [place] 06/15/2021, 10:32  AM

## 2021-06-15 NOTE — Progress Notes (Signed)
Speech Language Pathology Treatment: Dysphagia  Patient Details Name: Ricky [name] MRN: [id number] DOB: 14-Jun-1923 Today's Date: 06/15/2021 Time: 8768-1157 SLP Time Calculation (min) (ACUTE ONLY): 28 min  Assessment / Plan / Recommendation Clinical Impression  Pt demonstrates ability to participate in oral care and follow directions for therapy, despite delirium overnight. SLP repositioned pt upright, supported pts head into neutral. Fed pt slow teaspoons of nectar thick liquids and purees. Eventually pt did have a delayed cough. Pt would not do well with tube feeding. He was previously very mobile and tolerated dysphagia. Recommend beginning careful had feeding with ongoing physical therapy to combat weakness and keep lungs healthier. Daughter in agreement. Will start puree/nectar via teaspoon and frequent oral care. Discussed at length with daughter after session and RN.   HPI HPI: Ricky [name] is a 87 y.o. male who presented with worsening of generalized weakness, fall this morning.  Pt has had recent weightloss, amount not specified.  During this admission, possible aspiration event observed with CXR confirming "new infiltrate in the left lung correlating with history of aspiration."  Pt reports some dysphagia PTA and must wait a few minutes to an hour for symptoms to resolve before eating again.  Pt endorses both sticking sensation and things going down the wrong way.  Pt with medical history significant of chronic A. fib status post PPM, on Eliquis, HTN, chronic diastolic CHF, hypothyroidism, chronic ambulation dysfunction.      SLP Plan  Continue with current plan of care      Recommendations for follow up therapy are one component of a multi-disciplinary discharge planning process, led by the attending physician.  Recommendations may be updated based on patient status, additional functional criteria and insurance authorization.    Recommendations  Diet recommendations:  Nectar-thick liquid;Dysphagia 1 (puree) Liquids provided via: Teaspoon Medication Administration: Crushed with puree Compensations: Slow rate;Small sips/bites;Follow solids with liquid;Effortful swallow;Multiple dry swallows after each bite/sip;Clear throat intermittently                Oral Care Recommendations: Oral care QID Follow up Recommendations: Skilled Nursing facility Plan: Continue with current plan of care       GO                Simaya Lumadue, Katherene Ponto  06/15/2021, 1:47 PM

## 2021-06-15 NOTE — Progress Notes (Signed)
Initial Nutrition Assessment  DOCUMENTATION CODES:   Non-severe (moderate) malnutrition in context of chronic illness  INTERVENTION:   -Ensure Enlive po BID, each supplement provides 350 kcal and 20 grams of protein  -Magic cup TID with meals, each supplement provides 290 kcal and 9 grams of protein  -MVI with minerals daily  NUTRITION DIAGNOSIS:   Moderate Malnutrition related to chronic illness (CHF) as evidenced by mild fat depletion, moderate fat depletion, moderate muscle depletion, severe muscle depletion.  GOAL:   Patient will meet greater than or equal to 90% of their needs  MONITOR:   PO intake, Supplement acceptance, Diet advancement, Labs, Weight trends, Skin, I & O's  REASON FOR ASSESSMENT:   Malnutrition Screening Tool    ASSESSMENT:   Ricky [name] is a 87 y.o. male with medical history significant of chronic A. fib status post PPM, on Eliquis, HTN, chronic diastolic CHF, hypothyroidism, chronic ambulation dysfunction, presented with worsening of generalized weakness, fall this morning.  Pt admitted with mechanical fall due to acute on chronic ambulation dysfunction.   9/28- s/p FEES- NPO 9/29- s/p BSE- advanced to dysphagia 1 diet with nectar thick liquids  Reviewed I/O's: +503 ml x 24 hours and +1.2 L since admission  UOP: 300 ml x 24 hours  Case discussed with SLP; plan to advance to dysphagia 1 diet with nectar thick liquids. Per SLP, pt was eating well despite history of dysphagia and is okay with pt consume Ensure.   Spoke with pt and daughter at bedside. Daughter provided most of the history. She shares that pt had a hospitalization around 3 years ago that caused him to lose weight. His UBW is around 161# and he lost about 20 pounds during his hospitalization. She estimates pt now weighs around 140# and has lost approximately 5 pounds over the past year. Despite weight loss, pt has remained active. He continues to do his mobility stretches daily. He  recently stopped using his stationary bike, secondary to fear of losing balance and frequent falls. Pt has knee surgery in December, which has significantly impaired his mobility.   PTA, pt had a very good appetite. He consumes 3 meals and multiple snacks per day. Daughter prepares meals for him, which consist of multiple vegetables. He also consumes fish and an Ensure supplement daily.   Discussed importance of good meal and supplement intake to promote healing. They are amenable to magic Cups and Ensure.   Palliative care consult pending,.   Labs reviewed.   NUTRITION - FOCUSED PHYSICAL EXAM:  Flowsheet Row Most Recent Value  Orbital Region Severe depletion  Upper Arm Region Moderate depletion  Thoracic and Lumbar Region Mild depletion  Buccal Region Moderate depletion  Temple Region Severe depletion  Clavicle Bone Region Severe depletion  Clavicle and Acromion Bone Region Severe depletion  Scapular Bone Region Severe depletion  Dorsal Hand Moderate depletion  Patellar Region Moderate depletion  Anterior Thigh Region Moderate depletion  Posterior Calf Region Moderate depletion  Edema (RD Assessment) None  Hair Reviewed  Eyes Reviewed  Mouth Reviewed  Skin Reviewed  Nails Reviewed       Diet Order:   Diet Order             DIET - DYS 1 Room service appropriate? No; Fluid consistency: Nectar Thick  Diet effective now                   EDUCATION NEEDS:   Education needs have been addressed  Skin:  Skin Assessment: Reviewed RN Assessment  Last BM:  Unknown  Height:   Ht Readings from Last 1 Encounters:  06/05/2021 5\' 10"  (1.778 m)    Weight:   Wt Readings from Last 1 Encounters:  05/26/2021 65.8 kg    Ideal Body Weight:  75.5 kg  BMI:  Body mass index is 20.81 kg/m.  Estimated Nutritional Needs:   Kcal:  1950-2150  Protein:  95-110 grams  Fluid:  > 1.9 L    Loistine Chance, RD, LDN, [place] Registered Dietitian II Certified Diabetes Care and  Education Specialist Please refer to [facility] for RD and/or RD on-call/weekend/after hours pager

## 2021-06-15 NOTE — Plan of Care (Signed)
  Problem: Education: Goal: Knowledge of General Education information will improve Description: Including pain rating scale, medication(s)/side effects and non-pharmacologic comfort measures Outcome: Progressing   Problem: Health Behavior/Discharge Planning: Goal: Ability to manage health-related needs will improve Outcome: Progressing   Problem: Skin Integrity: Goal: Risk for impaired skin integrity will decrease Outcome: Progressing   Problem: Safety: Goal: Ability to remain free from injury will improve Outcome: Progressing

## 2021-06-15 NOTE — TOC Initial Note (Signed)
Transition of Care [facility]) - Initial/Assessment Note    Patient Details  Name: Ricky [name] MRN: [id number] Date of Birth: 09-21-1922  Transition of Care [facility]) CM/SW Contact:    Milinda Antis, [place] Phone Number: 06/15/2021, 4:16 PM  Clinical Narrative:                 CSW received consult for possible SNF placement at time of discharge. CSW spoke with patient's daughter who was at bedside as the patient was disoriented. The patient's daughter, who the patient lived with prior to this hospitalization, expressed understanding of PT recommendation and is agreeable to SNF placement at time of discharge.  The patient's daughter did not have a preference of facility, but would like for the patient to be in [place] if possible. CSW discussed insurance authorization process and will provide Medicare SNF ratings list. Patient has received  3 COVID vaccines.  No further questions reported at this time.  CSW will send out the SNF referral once the patient is out of restraints.   Skilled Nursing Rehab Facilities-   [email or web address] Ratings out of 5 possible    Name Address  Phone # [place] Overall  [facility] [address] [place] 5 1 4 4   [facility]  [address], [place] [phone] 3 1 5 4   [facility] [place], [place] 3 1 1 1   [place], [place] 2 2 4 4   [facility] [address], [place] 3 1 2 1   [place] [facility] [address] [place] [phone] 3 2 4 4   [facility] [address] [place] 5 1 2 2   [facility] [address], [place] 5 2 2 3   [place] [facility] [place] 5 1 2 2   [facility] [phone], [place] [phone] 5 1 2 2   [facility] [address], [facility] [phone] 5 1 2 2   [place], [place] 3 1 1 1            Patient Goals and CMS Choice        Expected Discharge Plan and Services                                                Prior Living Arrangements/Services                       Activities of Daily Living Home Assistive Devices/Equipment: Gilford Rile (specify type) ADL Screening (condition at time of admission) Patient's cognitive ability adequate to safely complete daily activities?: Yes Is the patient deaf or have difficulty hearing?: Yes Does the patient have difficulty seeing, even when wearing glasses/contacts?: No Does the patient have difficulty concentrating, remembering, or making decisions?: No Patient able to express need for assistance with ADLs?: Yes Does the patient have difficulty dressing or bathing?: Yes Independently performs ADLs?: Yes (appropriate for developmental age) Does the patient have difficulty walking or climbing stairs?: Yes Weakness of Legs: Both Weakness of Arms/Hands: Both  Permission Sought/Granted                  Emotional Assessment  Admission diagnosis:  Fall [W19.XXXA] Generalized weakness [R53.1] Minor head injury, initial encounter [K91.79XT] Fall, initial encounter [W19.XXXA] Aspiration into airway [T17.908A] Skin tear of left forearm without complication, initial encounter [S51.812A] Patient Active Problem List   Diagnosis Date Noted   Malnutrition of moderate degree 06/15/2021   Fall at home, initial encounter 06/02/2021   Fall 05/29/2021   Aspiration into airway 05/29/2021   Generalized weakness    PCP:  Plotnikov, Evie Lacks, MD Pharmacy:   [facility] [address] [place]), [place] [address] [place])  [address] Phone: [phone] Fax: [phone]     Social Determinants of Health (SDOH)  Interventions    Readmission Risk Interventions No flowsheet data found.

## 2021-06-15 NOTE — Progress Notes (Addendum)
Pt with increasingly confusion and restlessness, tries to get out of the bed and throws arms and legs to staff when redirecting. Clarene Essex TRH on call notified and ordered soft restraint. Will continue to monitor pt.  [phone] - RN able to redirect patient, now laying on bed with eyes closed. Will continue to monitor patient.

## 2021-06-15 NOTE — Progress Notes (Signed)
Physical Therapy Note  PT eval complete with full note to follow;  Recommend SNF for transition out of hospital;  Would like for him to get PT/OT/ST for rehab at [facility] post-acutely;  To try to get as close to baseline independence as possible;  Family is aware that he will likely need a higher level of care in the long-term;   Will follow acutely,   Roney Marion, [place] Pager [phone] Office [phone]

## 2021-06-16 DIAGNOSIS — E440 Moderate protein-calorie malnutrition: Secondary | ICD-10-CM | POA: Diagnosis not present

## 2021-06-16 DIAGNOSIS — R531 Weakness: Secondary | ICD-10-CM | POA: Diagnosis not present

## 2021-06-16 DIAGNOSIS — W19XXXA Unspecified fall, initial encounter: Secondary | ICD-10-CM | POA: Diagnosis not present

## 2021-06-16 DIAGNOSIS — Z7189 Other specified counseling: Secondary | ICD-10-CM

## 2021-06-16 DIAGNOSIS — Y92009 Unspecified place in unspecified non-institutional (private) residence as the place of occurrence of the external cause: Secondary | ICD-10-CM

## 2021-06-16 DIAGNOSIS — T17908A Unspecified foreign body in respiratory tract, part unspecified causing other injury, initial encounter: Secondary | ICD-10-CM | POA: Diagnosis not present

## 2021-06-16 DIAGNOSIS — S0990XA Unspecified injury of head, initial encounter: Secondary | ICD-10-CM | POA: Diagnosis not present

## 2021-06-16 DIAGNOSIS — Z66 Do not resuscitate: Secondary | ICD-10-CM

## 2021-06-16 DIAGNOSIS — S51812A Laceration without foreign body of left forearm, initial encounter: Secondary | ICD-10-CM | POA: Diagnosis not present

## 2021-06-16 DIAGNOSIS — Z515 Encounter for palliative care: Secondary | ICD-10-CM

## 2021-06-16 LAB — BASIC METABOLIC PANEL
Anion gap: 7 (ref 5–15)
BUN: 25 mg/dL — ABNORMAL HIGH (ref 8–23)
CO2: 27 mmol/L (ref 22–32)
Calcium: 8.7 mg/dL — ABNORMAL LOW (ref 8.9–10.3)
Chloride: 110 mmol/L (ref 98–111)
Creatinine, Ser: 0.92 mg/dL (ref 0.61–1.24)
GFR, Estimated: >60 mL/min (ref >60)
Glucose, Bld: 125 mg/dL — ABNORMAL HIGH (ref 70–99)
Potassium: 3.8 mmol/L (ref 3.5–5.1)
Sodium: 144 mmol/L (ref 135–145)

## 2021-06-16 LAB — CBC
HCT: 40.4 % (ref 39.0–52.0)
Hemoglobin: 13.5 g/dL (ref 13.0–17.0)
MCH: 35.1 pg — ABNORMAL HIGH (ref 26.0–34.0)
MCHC: 33.4 g/dL (ref 30.0–36.0)
MCV: 104.9 fL — ABNORMAL HIGH (ref 80.0–100.0)
Platelets: 183 10*3/uL (ref 150–400)
RBC: 3.85 MIL/uL — ABNORMAL LOW (ref 4.22–5.81)
RDW: 13.1 % (ref 11.5–15.5)
WBC: 9.8 10*3/uL (ref 4.0–10.5)
nRBC: 0 % (ref 0.0–0.2)

## 2021-06-16 MED ORDER — MORPHINE SULFATE (PF) 2 MG/ML IV SOLN
2.0000 mg | INTRAVENOUS | Status: DC | PRN
Start: 2021-06-16 — End: 2021-06-24
  Administered 2021-06-16 – 2021-06-24 (×19): 2 mg via INTRAVENOUS
  Filled 2021-06-16 (×19): qty 1

## 2021-06-16 MED ORDER — MORPHINE SULFATE (PF) 2 MG/ML IV SOLN: 1.0000 mg | INTRAVENOUS | Status: AC

## 2021-06-16 MED ORDER — BIOTENE DRY MOUTH MT LIQD: 15.0000 mL | OROMUCOSAL | Status: DC | PRN

## 2021-06-16 MED ORDER — MORPHINE SULFATE (PF) 2 MG/ML IV SOLN
INTRAVENOUS | Status: AC
  Administered 2021-06-16: 1 mg via INTRAVENOUS
  Filled 2021-06-16: qty 1

## 2021-06-16 MED ORDER — LORAZEPAM 2 MG/ML IJ SOLN
0.5000 mg | INTRAMUSCULAR | Status: DC | PRN
  Administered 2021-06-18 (×2): 0.5 mg via INTRAVENOUS
  Filled 2021-06-16 (×2): qty 1

## 2021-06-16 MED ORDER — POLYVINYL ALCOHOL 1.4 % OP SOLN
1.0000 [drp] | Freq: Four times a day (QID) | OPHTHALMIC | Status: DC | PRN
  Administered 2021-06-20: 1 [drp] via OPHTHALMIC
  Filled 2021-06-16: qty 15

## 2021-06-16 MED ORDER — GLYCOPYRROLATE 0.2 MG/ML IJ SOLN
0.3000 mg | INTRAMUSCULAR | Status: DC | PRN
  Filled 2021-06-16: qty 1.5

## 2021-06-16 MED ORDER — ONDANSETRON HCL 4 MG/2ML IJ SOLN: 4.0000 mg | Freq: Four times a day (QID) | INTRAMUSCULAR | Status: DC | PRN

## 2021-06-16 MED ORDER — MORPHINE SULFATE (PF) 2 MG/ML IV SOLN: 2.0000 mg | INTRAVENOUS | Status: DC | PRN

## 2021-06-16 MED ORDER — HYDRALAZINE HCL 20 MG/ML IJ SOLN
10.0000 mg | Freq: Four times a day (QID) | INTRAMUSCULAR | Status: DC | PRN
  Administered 2021-06-16: 10 mg via INTRAVENOUS
  Filled 2021-06-16: qty 1

## 2021-06-16 NOTE — Progress Notes (Signed)
   Palliative Medicine Inpatient Follow Up Note     Family at the bedside (4 children). Spoke with Festus Holts in presence of her family. Emotional support provided. We discussed patient's poor prognosis. Family has requested all care to focus on his comfort for what time he has left.   Education provided on comfort care measures and expectations at end-of-life. Daughter verbalized understanding and appreciation of all care being provided.   SUMMARY OF RECOMMENDATIONS   Transition all care to focus on comfort. No escalation, anticipated hospital death.  Morphine PRN for pain/air hunger/comfort Robinul PRN for excessive secretions Ativan PRN for agitation/anxiety Zofran PRN for nausea Liquifilm tears PRN for dry eyes Comfort cart for family Unrestricted visitations in the setting of EOL (per policy) Oxygen PRN 2L or less for comfort. No escalation.   PMT will continue to support and follow.   Discussed with Dr. Broadus John and Judson Roch, RN.   Time Total: 20 min.   Visit consisted of counseling and education dealing with the complex and emotionally intense issues of symptom management and palliative care in the setting of serious and potentially life-threatening illness.Greater than 50%  of this time was spent counseling and coordinating care related to the above assessment and plan.  Alda Lea, AGPCNP-BC  Palliative Medicine Team [phone]  Palliative Medicine Team providers are available by phone from 7am to 7pm daily and can be reached through the team cell phone. Should this patient require assistance outside of these hours, please call the patient's attending physician.

## 2021-06-16 NOTE — Progress Notes (Signed)
SLP Cancellation Note  Patient Details Name: Ricky [name] MRN: [id number] DOB: 24-Jan-1923   Cancelled treatment:       Reason Eval/Treat Not Completed: Medical issues which prohibited therapy. Pt in respiratory distress following lunch. Family and RN report pt appeared to do well, was being carefully hand fed, but is desatting following meal. Reviewed swallowing problems with son at bedside. Risk of aspiration ongoing and severe, unlikely to be resolved with interventions in the short term. Will f/u as needed.    [name], MA CCC-SLP  Acute Rehabilitation Services Office [phone]  [name] 06/16/2021, 1:25 PM

## 2021-06-16 NOTE — Progress Notes (Signed)
Addendum, patient developed acute respiratory distress secondary to large aspiration event. -Currently obtunded, minimally responsive, tachypneic, on a nonrebreather mask with O2 sats in the 88-90% range -Significant change from prior -He is DNR, we attempted oropharyngeal and NT suctioning, remains on IV Abx -Added morphine as needed for respiratory distress -High risk of demise today, discussed with son at bedside, palliative NP called and updated patient's daughter as well -Plan to transition to comfort measures after daughter arrives  Domenic Polite, MD

## 2021-06-16 NOTE — Significant Event (Signed)
Rapid Response Event Note   Reason for Call :  Respiratory Distress  Initial Focused Assessment:  Patient is unresponsive. He has very labored breathing with significant accessory muscle use. Lung sounds: rhonchi through out.  Per staff he was alert and spoke with family this am.  He was hand fed lunch. After which he developed respiratory distress an O2 desat on [place]. Placed on NRB  BP 152/63  HR 70  RR 28  O2 sat on NRB 94%  Son and Dr Broadus John at bedside NP spoke with daughter via phone Pt with DNR status    Interventions:  1 mg Morphine IV for increased work of breathing NT suction: mod to large amount of tan secretions.  O2 sats improved to 98% Repositioned patient  Plan of Care:  PRN suction and Morphine as needed for symptom control Additional family coming to bedside   Event Summary:   MD Notified: Dr Broadus John at bedside Call Time: [address] Time: 6244 End Time: [place]  Raliegh Ip, RN

## 2021-06-16 NOTE — Care Management Important Message (Signed)
Important Message  Patient Details  Name: Ricky [name] MRN: [id number] Date of Birth: 1923/07/12   Medicare Important Message Given:  Yes     Ricky [name] 06/16/2021, 3:38 PM

## 2021-06-16 NOTE — Consult Note (Signed)
Palliative Care Consult Note                                  Date: 06/16/2021   Patient Name: Ricky [name]  DOB: 02-23-1923  MRN: [id number]  Age / Sex: 87 y.o., male  PCP: Plotnikov, Evie Lacks, MD Referring Physician: Domenic Polite, MD  Reason for Consultation: Establishing goals of care  HPI/Patient Profile: Palliative Care consult requested for goals of care discussion in this 87 y.o. male  with past medical history of falls, atrial fibrillation (Eliquis) s/p PPM, hypertension, diasatolic CHF, hypothyroidism, and chronic ambulation dysfunction. He was admitted on 05/22/2021 from home with generalized s/p fall. CT head and x-rays negative for acute findings. Patient being followed by SLP in the setting of dysphagia with recommendations for dysphagia nectar thick diet. Aspiration precautions.   History reviewed. No pertinent past medical history.   Subjective:   This NP Osborne Oman reviewed medical records, received report from team, assessed the patient and then met at the patient's bedside with patient and his son, [name] to discuss diagnosis, prognosis, GOC, EOL wishes disposition and options. I also spoke with patient's daughter Festus Holts via phone.   [name] is awake, alert and answers orientation questions correctly. Some confusion noted during discussions.    Concept of Palliative Care was introduced as specialized medical care for people and their families living with serious illness.  It focuses on providing relief from the symptoms and stress of a serious illness.  The goal is to improve quality of life for both the patient and the family. Values and goals of care important to patient and family were attempted to be elicited.  I created space and opportunity for patient and family to explore state of health prior to admission, thoughts, and feelings. Patient lives in the home with his daughter, Festus Holts and her family. He lived by  himself up until about 2.5 years ago. Son shares noticeable decline in health over the past 6-9 months.   Appetite was fair however with noticeable signs of aspiration (cough/choking). Patient was independent of most ADLs. Ambulatory with a walker however with frequent falls most recently and increased weakness.   We discussed His current illness and what it means in the larger context of His on-going co-morbidities. Discussed at length high aspiration risk and comfort feedings. Artificial nutrition discussed including pros and cons. Recommendations against PEG or Coretrak in the setting of confusion, advanced age, and no significant decrease in aspiration risk. Natural disease trajectory and expectations were discussed.  Family verbalized understanding of current illness and co-morbidities. They share patient's decline over the past months and decrease quality of life. They are not interested in artificial feedings/PEG. Wishes to allow patient the opportunity to continue with comfort feeds with recommended aspiration precautions.   I discussed the importance of continued conversation with family and their medical providers regarding overall plan of care and treatment options, ensuring decisions are within the context of the patients values and GOCs.  Questions and concerns were addressed. The family was encouraged to call with questions or concerns.  PMT will continue to support holistically as needed.  1415: Rapid Response called after patient had an aspiration event after eating lunch. Son at the bedside holding father's hand. Patient is minimally responsive, hypoxic, labored with use of accessory muscles, non-rebreather in place. Dr. Broadus John also at the bedside. Patient requiring naso suctioning with fair amount of thick  content in tubing. Spoke with daughter, Festus Holts via phone and provided updates. She is on the way to the hospital. Confirmed DNR/DNI no escalation of care. She and her brother are  realistic in their understanding. Education provided by Dr. Broadus John and myself regarding high risk of sudden death, recommendations to focus on comfort with anticipated hospital death (hours). Daughter verbalized her understanding and agreement to provide PRN medications for dyspnea and minimize distress. Once daughter arrives family will then be prepared to shift care to focus solely on comfort.   Life Review: Patient is widowed. Wife passed away 5 years ago. Patient has lived in the home with his daughter for past 2.5 years. He has 4 children. He served in the [place] [employer] and [id number]. 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 Christian faith.    Objective:   Primary Diagnoses: Present on Admission:  Fall  Aspiration into airway   Scheduled Meds:  apixaban  5 mg Oral BID   carvedilol  12.5 mg Oral BID WC   feeding supplement  237 mL Oral BID BM   levothyroxine  75 mcg Oral Q0600   lidocaine  1 patch Transdermal Q24H   multivitamin with minerals  1 tablet Oral Daily    Continuous Infusions:  cefTRIAXone (ROCEPHIN)  IV Stopped (06/15/21 2038)    PRN Meds: acetaminophen **OR** acetaminophen, hydrALAZINE, ipratropium-albuterol, ondansetron **OR** ondansetron (ZOFRAN) IV  Allergies  Allergen Reactions   Amiodarone     Weakness in Legs   Amoxicillin     unknown   Bee Venom     unknown   Cardizem [Diltiazem]     unknown   Lipitor [Atorvastatin]     unknown    Review of Systems  Constitutional:  Positive for appetite change.  Respiratory:  Positive for cough.   Neurological:  Positive for weakness.  Unless otherwise noted, a complete review of systems is negative.  Physical Exam General: NAD, frail chronically-ill appearing Cardiovascular: regular rate and rhythm Pulmonary: coarse, congested cough  Abdomen: soft, nontender, + bowel sounds Extremities: no edema, no joint deformities Skin: no rashes, warm and dry Neurological: AAO x3, some confusion noted, mood appropriate, follows  commands   Vital Signs:  BP (!) 191/86 (BP Location: Right Arm)   Pulse 70   Temp 97.7 F (36.5 C) (Axillary)   Resp 19   Ht '5\' 10"'  (1.778 m)   Wt 65.8 kg   SpO2 94%   BMI 20.81 kg/m  Pain Scale: 0-10   Pain Score: 0-No pain  SpO2: SpO2: 94 % O2 Device:SpO2: 94 % O2 Flow Rate: .O2 Flow Rate (L/min): 2 L/min  IO: Intake/output summary:  Intake/Output Summary (Last 24 hours) at 06/16/2021 1210 Last data filed at 06/16/2021 1100 Gross per 24 hour  Intake 580 ml  Output 600 ml  Net -20 ml    LBM: Last BM Date: 06/04/2021 Baseline Weight: Weight: 65.8 kg Most recent weight: Weight: 65.8 kg      Palliative Assessment/Data: PPS 20%   Advanced Care Planning:   Primary Decision Maker: HCPOA  Code Status/Advance Care Planning: DNR  A discussion was had today regarding advanced directives. Concepts specific to code status, artifical feeding and hydration, continued IV antibiotics and rehospitalization was had.    Patient has a documented advanced directive. His daughter, Peggyann Shoals is he primary HCPOA. Family confirms DNR/DNI, no artificial feedings.   The difference between a aggressive medical intervention path and a palliative comfort care path was discussed.   Hospice and Palliative Care  services outpatient were explained and offered. Patient and family verbalized their understanding and awareness of both palliative and hospice's goals and philosophy of care. Family is requesting outpatient Palliative support. They are aware they may transition care to focus on comfort and hospice support at any time.   Family clear in expressed goals to continue to treat the treatable. They are realistic in their understanding. No artificial feedings/PEG, allowing patient comfort feeds with aspiration precautions in place. They are not interested in invasive interventions. Open to re-evaluating goals if patient further declines or shows not improvement. Hopeful for SNF placement with  request for outpatient palliative support.   Assessment & Plan:   SUMMARY OF RECOMMENDATIONS   DNR/DNI-as confirmed by family Continue with current plan of care, treat the treatable. Family is realistic in understanding. SNF rehab with Palliative at minimum. No escalation of care. Open to re-evaluating goals if further declines or no improvement.  Outpatient palliative.   Decline in status s/p eating lunch most likely aspiration event. Daughter on the way to the hospital with awareness patient is approaching end-of-life. No escalation of care. PRN morphine for dyspnea/air hunger. Once Festus Holts arrives family will agree to full comfort.  PMT will continue to support and follow as needed. Please call team line with urgent needs.  Symptom Management:  Morphine PRN for air hunger/dyspnea  Palliative Prophylaxis:  Aspiration and Frequent Pain Assessment  Additional Recommendations (Limitations, Scope, Preferences): Minimize Medications and treat the treatable, no escalation, ongoing goals of care discussions   Psycho-social/Spiritual:  Desire for further Chaplaincy support: no Additional Recommendations:  education on end-of-life, emotional support   Prognosis:  Hours - Days  Discharge Planning:  Anticipated Hospital Death   Discussed with: RN and Dr. Broadus John at the bedside.   Family expressed understanding and was in agreement with this plan.   Time: 3568-6168 Time: 3729-0211 Time Total: 70 min.   Visit consisted of counseling and education dealing with the complex and emotionally intense issues of symptom management and palliative care in the setting of serious and potentially life-threatening illness.Greater than 50%  of this time was spent counseling and coordinating care related to the above assessment and plan.  Signed by:  Alda Lea, AGPCNP-BC Palliative Medicine Team  Phone: [phone] Pager: [phone] Amion: Bjorn Pippin   Thank you for allowing the  Palliative Medicine Team to assist in the care of this patient. Please utilize secure chat with additional questions, if there is no response within 30 minutes please call the above phone number. Palliative Medicine Team providers are available by phone from 7am to 5pm daily and can be reached through the team cell phone.  Should this patient require assistance outside of these hours, please call the patient's attending physician.

## 2021-06-16 NOTE — Progress Notes (Signed)
PROGRESS NOTE    Ricky [name]  QQP:[id number] DOB: 02-Nov-1922 DOA: 06/06/2021 PCP: Cassandria Anger, MD  Brief Narrative: 87 year old male with history of chronic A. fib, PPM, on Eliquis, chronic diastolic CHF, hypertension, hypothyroidism, chronic gait disorder/ambulatory dysfunction presented to the ED with generalized weakness and fall yesterday. -According to patient's son he lives with a daughter, ambulates with a walker but for the most part is somewhat independent, patient reports chronic right knee pain, bilateral feet numbness for months, feels unsteady and getting weaker over the last 1 week or so, switched from showers to sponge baths. -9/27 reportedly fell while trying to stand up and hit his head on the monitor. -Also reports history of dysphagia, having to chew his food slowly, intermittent coughing and choking after foods especially liquids ongoing for years but worse in the last week or so. -In the ED work-up was unremarkable, CT head and labs were unrevealing -While in the ED he was noted to have an aspiration event followed by severe hypoxia   Assessment & Plan:   Aspiration pneumonia Dysphagia, acute on chronic -SLP evaluation 9/28 noted severe oropharyngeal dysphagia, suspected to be chronic, likely has been compensating at baseline for a while now but progressively worsening -Discussed poor prognosis with son, given advanced age and frailty, recommended dysphagia diet while understanding and accepting risks of aspiration, I do not think he is a candidate for PEG tube, discussed this with patient's son -Palliative care consulted for goals of care -Started on dysphagia diet yesterday -Multiple antibiotic allergies noted, continue IV ceftriaxone day 3/7 -PT OT eval, SNF recommended  Delirium -In setting of advanced age, pneumonia, hospitalization -Continue to monitor, avoid benzos  Gait disorder Ambulatory dysfunction -Progressive worsening, CT head  unremarkable -PT eval completed, assessment was limited, SNF recommended  Moderate protein calorie malnutrition -Supplements as tolerated  Chronic A. Fib -Continue carvedilol and apixaban  Chronic diastolic CHF -Clinically euvolemic, hold torsemide and Aldactone  Chronic constipation -Continue lactulose  DVT prophylaxis: Eliquis Code Status: DNR Family Communication: Discussed with son at bedside yesterday Disposition Plan:  Status is: Inpatient  Remains inpatient appropriate because:Inpatient level of care appropriate due to severity of illness  Dispo: The patient is from: Home              Anticipated d/c is to: ? Snf/Long term care              Patient currently is not medically stable to d/c.   Difficult to place patient No  Consultants:  SLP  Procedures:   Antimicrobials:    Subjective: -Overnight had increased agitation and hallucinations  Objective: Vitals:   06/15/21 1543 06/15/21 2035 06/16/21 0540 06/16/21 0816  BP: (!) 144/52 (!) 152/90 (!) 182/87 (!) 191/86  Pulse: 98   70  Resp:  16 20 19   Temp: 97.7 F (36.5 C) 97.8 F (36.6 C) 98.1 F (36.7 C) 97.7 F (36.5 C)  TempSrc: Oral Oral Oral Axillary  SpO2: 98% 93% 91% 94%  Weight:      Height:        Intake/Output Summary (Last 24 hours) at 06/16/2021 1136 Last data filed at 06/16/2021 1100 Gross per 24 hour  Intake 580 ml  Output 600 ml  Net -20 ml   Filed Weights   05/31/2021 1017  Weight: 65.8 kg    Examination:  General exam: Chronically ill elderly male, lying in bed, awake alert oriented to self and partly to place only, cognitive deficits noted CVS: S1-S2, regular  rate rhythm Lungs: Decreased breath sounds at the bases, extensive scattered rhonchi and conducted upper airway sounds Abdomen: Soft, nontender, bowel sounds present Extremities: No edema  Skin: No rashes on exposed skin Psychiatry: Poor insight and judgment  Data Reviewed:   CBC: Recent Labs  Lab 05/28/2021 1019  06/14/21 0431 06/15/21 0213 06/16/21 0334  WBC 5.3 15.5* 9.9 9.8  NEUTROABS 3.2 13.0*  --   --   HGB 13.6 14.6 13.4 13.5  HCT 42.1 43.4 40.1 40.4  MCV 107.4* 104.8* 104.4* 104.9*  PLT 211 197 186 466   Basic Metabolic Panel: Recent Labs  Lab 06/04/2021 1019 06/15/21 0213 06/16/21 0334  NA 135 139 144  K 4.4 3.8 3.8  CL 100 106 110  CO2 28 24 27   GLUCOSE 92 104* 125*  BUN 28* 24* 25*  CREATININE 1.06 0.90 0.92  CALCIUM 9.2 8.6* 8.7*   GFR: Estimated Creatinine Clearance: 44.7 mL/min (by C-G formula based on SCr of 0.92 mg/dL). Liver Function Tests: No results for input(s): AST, ALT, ALKPHOS, BILITOT, PROT, ALBUMIN in the last 168 hours. No results for input(s): LIPASE, AMYLASE in the last 168 hours. No results for input(s): AMMONIA in the last 168 hours. Coagulation Profile: No results for input(s): INR, PROTIME in the last 168 hours. Cardiac Enzymes: No results for input(s): CKTOTAL, CKMB, CKMBINDEX, TROPONINI in the last 168 hours. BNP (last 3 results) No results for input(s): PROBNP in the last 8760 hours. HbA1C: No results for input(s): HGBA1C in the last 72 hours. CBG: Recent Labs  Lab 05/26/2021 1025  GLUCAP 94   Lipid Profile: No results for input(s): CHOL, HDL, LDLCALC, TRIG, CHOLHDL, LDLDIRECT in the last 72 hours. Thyroid Function Tests: No results for input(s): TSH, T4TOTAL, FREET4, T3FREE, THYROIDAB in the last 72 hours. Anemia Panel: No results for input(s): VITAMINB12, FOLATE, FERRITIN, TIBC, IRON, RETICCTPCT in the last 72 hours. Urine analysis:    Component Value Date/Time   COLORURINE YELLOW 06/05/2021 1256   APPEARANCEUR HAZY (A) 06/07/2021 1256   LABSPEC 1.014 05/26/2021 1256   PHURINE 8.0 05/30/2021 1256   GLUCOSEU NEGATIVE 06/07/2021 1256   HGBUR NEGATIVE 06/11/2021 1256   [place] 06/04/2021 1256   [place] 06/07/2021 1256   PROTEINUR NEGATIVE 06/08/2021 1256   NITRITE NEGATIVE 05/21/2021 1256   LEUKOCYTESUR  NEGATIVE 06/07/2021 1256   Sepsis Labs: @LABRCNTIP (procalcitonin:4,lacticidven:4)  ) Recent Results (from the past 240 hour(s))  SARS CORONAVIRUS 2 (TAT 6-24 HRS) Nasopharyngeal Nasopharyngeal Swab     Status: None   Collection Time: 05/29/2021  5:48 PM   Specimen: Nasopharyngeal Swab  Result Value Ref Range Status   SARS Coronavirus 2 NEGATIVE NEGATIVE Final    Comment: (NOTE) SARS-CoV-2 target nucleic acids are NOT DETECTED.  The SARS-CoV-2 RNA is generally detectable in upper and lower respiratory specimens during the acute phase of infection. Negative results do not preclude SARS-CoV-2 infection, do not rule out co-infections with other pathogens, and should not be used as the sole basis for treatment or other patient management decisions. Negative results must be combined with clinical observations, patient history, and epidemiological information. The expected result is Negative.  Fact Sheet for Patients: SugarRoll.be  Fact Sheet for Healthcare Providers: https://www.woods-mathews.com/  This test is not yet approved or cleared by the [place] FDA and  has been authorized for detection and/or diagnosis of SARS-CoV-2 by FDA under an Emergency Use Authorization (EUA). This EUA will remain  in effect (meaning this test can be used) for the duration of  the COVID-19 declaration under Se ction 564(b)(1) of the Act, 21 U.S.C. section 360bbb-3(b)(1), unless the authorization is terminated or revoked sooner.  Performed at [facility], [place] [address], [place], [place] [address]      Scheduled Meds:  apixaban  5 mg Oral BID   carvedilol  12.5 mg Oral BID WC   feeding supplement  237 mL Oral BID BM   levothyroxine  75 mcg Oral Q0600   lidocaine  1 patch Transdermal Q24H   multivitamin with minerals  1 tablet Oral Daily   Continuous Infusions:  cefTRIAXone (ROCEPHIN)  IV Stopped (06/15/21 2038)     LOS: 3 days    Time  spent: 41min  Domenic Polite, MD [facility]   06/16/2021, 11:36 AM

## 2021-06-16 NOTE — Evaluation (Signed)
Occupational Therapy Evaluation Patient Details Name: Ricky [name] MRN: [id number] DOB: 1923/08/18 Today's Date: 06/16/2021   History of Present Illness Pt is a 87 y.o. male admitted 05/28/2021 as level 2 trauma after fall at home hitting his head; family also reports progressive weakness. Pt with abrasions to L forearm. Head CT negative for acute injury; chronic microvascular disease. No PMH on file.   Clinical Impression   PTA patients son reports only recent assist for ADLs and mobility (since Tuesday).  Admitted for above and limited by problem list below, including impaired cognition, decreased activity tolerance, generalized weakness. Pt oriented to self only, inconsistently following 1 step commands with increased time and poor awareness.  Patient currently requires max assist to wash mouth/chin given proximal support for weakness, and total assist for all other ADLs. Remained bed level due to lethargy and needing +2 for OOB.  Will follow acutely, recommend SNF at dc.      Recommendations for follow up therapy are one component of a multi-disciplinary discharge planning process, led by the attending physician.  Recommendations may be updated based on patient status, additional functional criteria and insurance authorization.   Follow Up Recommendations  SNF;Supervision/Assistance - 24 hour    Equipment Recommendations  Other (comment) (TBD)    Recommendations for Other Services Other (comment) (palliative care)     Precautions / Restrictions Precautions Precautions: Fall Restrictions Weight Bearing Restrictions: No      Mobility Bed Mobility               General bed mobility comments: maintained bed level due to lethargy and cognition    Transfers                      Balance                                           ADL either performed or assessed with clinical judgement   ADL Overall ADL's : Needs assistance/impaired      Grooming: Maximal assistance;Wash/dry face;Bed level Grooming Details (indicate cue type and reason): max assist to wash mouth with R hand, proximal support required due to weakness                               General ADL Comments: total assist for all other self care     Vision   Additional Comments: keeps eyes closed during session, opens but closes immedately.  Son reports at end of session pt sensitive to light.     Perception     Praxis      Pertinent Vitals/Pain Pain Assessment: Faces Faces Pain Scale: Hurts a little bit Pain Location: generalized Pain Descriptors / Indicators: Grimacing Pain Intervention(s): Limited activity within patient's tolerance;Monitored during session     Hand Dominance     Extremity/Trunk Assessment Upper Extremity Assessment Upper Extremity Assessment: Generalized weakness;Difficult to assess due to impaired cognition (able to squeeze hands and "push" therapist away with increased time)   Lower Extremity Assessment Lower Extremity Assessment: Defer to PT evaluation       Communication Communication Communication: HOH   Cognition Arousal/Alertness: Lethargic Behavior During Therapy: Flat affect Overall Cognitive Status: Difficult to assess Area of Impairment: Orientation;Problem solving;Following commands;Attention  Orientation Level: Disoriented to;Place;Time;Situation Current Attention Level: Focused   Following Commands: Follows one step commands inconsistently     Problem Solving: Slow processing;Decreased initiation;Difficulty sequencing;Requires verbal cues;Requires tactile cues General Comments: pt able to follow simple 1 step commands with repetition but very inconsistent, limited due to lethargy and fatigued   General Comments  son, Ricky [name] arrives at end of session.    Exercises     Shoulder Instructions      Home Living Family/patient expects to be discharged to:: Private  residence Living Arrangements: Children Available Help at Discharge: Family Type of Home: House Home Access: Stairs to enter Technical brewer of Steps: 2 Entrance Stairs-Rails: Right Home Layout: Able to live on main level with bedroom/bathroom     Bathroom Shower/Tub: Occupational psychologist: Standard     Home Equipment: Environmental consultant - 2 wheels          Prior Functioning/Environment Level of Independence: Independent with assistive device(s);Needs assistance  Gait / Transfers Assistance Needed: Independent with RW until recently; Day prior to admission pt's duaghter noted needing a lot of help to rise from sitting to standign ADL's / Homemaking Assistance Needed: No assist needed with bathing and dressing until recently   Comments: Pt's son, Ricky [name] reports quite a precipitous functional decline        OT Problem List: Decreased strength;Decreased activity tolerance;Impaired balance (sitting and/or standing);Impaired vision/perception;Decreased coordination;Decreased cognition;Decreased safety awareness;Decreased knowledge of use of DME or AE;Decreased knowledge of precautions      OT Treatment/Interventions: Self-care/ADL training;DME and/or AE instruction;Therapeutic activities;Cognitive remediation/compensation;Balance training;Patient/family education    OT Goals(Current goals can be found in the care plan section) Acute Rehab OT Goals Patient Stated Goal: none stated OT Goal Formulation: Patient unable to participate in goal setting Time For Goal Achievement: 06/30/21 Potential to Achieve Goals: Good  OT Frequency: Min 2X/week   Barriers to D/C:            Co-evaluation              AM-PAC OT "6 Clicks" Daily Activity     Outcome Measure Help from another person eating meals?: Total Help from another person taking care of personal grooming?: A Lot Help from another person toileting, which includes using toliet, bedpan, or urinal?: Total Help  from another person bathing (including washing, rinsing, drying)?: Total Help from another person to put on and taking off regular upper body clothing?: Total Help from another person to put on and taking off regular lower body clothing?: Total 6 Click Score: 7   End of Session Nurse Communication: Mobility status  Activity Tolerance: Patient limited by lethargy Patient left: in bed;with call bell/phone within reach;with bed alarm set;with family/visitor present  OT Visit Diagnosis: Other abnormalities of gait and mobility (R26.89);Muscle weakness (generalized) (M62.81);History of falling (Z91.81);Other symptoms and signs involving cognitive function                Time: 9381-0175 OT Time Calculation (min): 23 min Charges:  OT General Charges $OT Visit: 1 Visit OT Evaluation $OT Eval Moderate Complexity: 1 Mod OT Treatments $Self Care/Home Management : 8-22 mins  Jolaine Artist, OT Acute Rehabilitation Services Pager [phone] Office [phone]   Delight Stare 06/16/2021, 10:52 AM

## 2021-06-17 DIAGNOSIS — T17908A Unspecified foreign body in respiratory tract, part unspecified causing other injury, initial encounter: Secondary | ICD-10-CM | POA: Diagnosis not present

## 2021-06-17 DIAGNOSIS — Z515 Encounter for palliative care: Secondary | ICD-10-CM

## 2021-06-17 DIAGNOSIS — E440 Moderate protein-calorie malnutrition: Secondary | ICD-10-CM | POA: Diagnosis not present

## 2021-06-17 DIAGNOSIS — W19XXXA Unspecified fall, initial encounter: Secondary | ICD-10-CM | POA: Diagnosis not present

## 2021-06-17 DIAGNOSIS — R531 Weakness: Secondary | ICD-10-CM | POA: Diagnosis not present

## 2021-06-17 MED ORDER — GLYCOPYRROLATE 0.2 MG/ML IJ SOLN
0.4000 mg | INTRAMUSCULAR | Status: DC
  Administered 2021-06-17 – 2021-06-23 (×34): 0.4 mg via INTRAVENOUS
  Filled 2021-06-17 (×35): qty 2
  Filled 2021-06-17: qty 4
  Filled 2021-06-17 (×5): qty 2

## 2021-06-17 MED ORDER — GLYCOPYRROLATE 0.2 MG/ML IJ SOLN
0.3000 mg | INTRAMUSCULAR | Status: DC
  Filled 2021-06-17 (×2): qty 1.5

## 2021-06-17 NOTE — Progress Notes (Signed)
   Palliative Medicine Inpatient Follow Up Note   Subjective: Medical records reviewed. Patient assessed at the bedside. He opens his eyes but does not answer my questions. No family present during my visit.   Physical Exam: General: eldery, frail caucasian M in NAD HEENT: copious secretions heard with breathing Pulm: normal respiratory effort without accessory muscle use Skin: warm and dry Neuro: lethargic, disoriented   Assessment: End of life care  SUMMARY OF RECOMMENDATIONS   Robinul increased to 0.4mg  IV Q4H given persistent secretions Continue comfort medications per [facility] Anticipated hospital death  Oxygen PRN 2L or less for comfort. No escalation.   PMT will continue to support and follow.     Time Total: 15 min.  Greater than 50% of this time was spent in counseling and coordinating care related to the above assessment and plan.  Dorthy Cooler, PA-C Palliative Medicine Team Team phone # [phone]  Thank you for allowing the Palliative Medicine Team to assist in the care of this patient. Please utilize secure chat with additional questions, if there is no response within 30 minutes please call the above phone number.  Palliative Medicine Team providers are available by phone from 7am to 7pm daily and can be reached through the team cell phone.  Should this patient require assistance outside of these hours, please call the patient's attending physician.

## 2021-06-17 NOTE — Progress Notes (Addendum)
PROGRESS NOTE    Ricky [name]  TDD:[id number] DOB: 18-Jun-1923 DOA: 05/22/2021 PCP: Cassandria Anger, MD  Brief Narrative: 87 year old male with history of chronic A. fib, PPM, on Eliquis, chronic diastolic CHF, hypertension, hypothyroidism, chronic gait disorder/ambulatory dysfunction presented to the ED with generalized weakness and fall yesterday. -According to patient's son he lives with a daughter, ambulates with a walker but for the most part is somewhat independent, patient reports chronic right knee pain, bilateral feet numbness for months, feels unsteady and getting weaker over the last 1 week or so, switched from showers to sponge baths. -9/27 reportedly fell while trying to stand up and hit his head on the monitor. -Also reports history of dysphagia, having to chew his food slowly, intermittent coughing and choking after foods especially liquids ongoing for years but worse in the last week or so. -In the ED work-up was unremarkable, CT head and labs were unrevealing -While in the ED he was noted to have an aspiration event followed by severe hypoxia   Assessment & Plan:   Acute hypoxic respiratory failure Aspiration pneumonia Dysphagia, acute on chronic -SLP evaluation 9/28 noted severe oropharyngeal dysphagia, suspected to be chronic, likely has been compensating at baseline for a while now but progressively worsening -Discussed poor prognosis with son, given advanced age and frailty, recommended dysphagia diet while understanding and accepting risks of aspiration, he is not a candidate for PEG tube, discussed this with patient's son -Treated with antibiotics, attempted dysphagia diet -Palliative consulted and following, appreciate input -Yesterday 9/30 in the afternoon patient decompensated significantly after recurrent aspiration, obtunded tachypneic and hypoxic, transition to comfort measures now -Anticipate hospital demise, if stabilizes will need residential  hospice  Delirium -In setting of advanced age, pneumonia, hospitalization -Continue to monitor, avoid benzos  Gait disorder Ambulatory dysfunction -Progressive worsening, CT head unremarkable -PT eval completed  Moderate protein calorie malnutrition -Now comfort care  Chronic A. Fib -Now comfort care  Chronic diastolic CHF -Clinically euvolemic, hold torsemide and Aldactone  Chronic constipation -Comfort care  DVT prophylaxis: none Code Status: DNR Family Communication: Discussed with son at bedside yesterday Disposition Plan:  Status is: Inpatient  Remains inpatient appropriate because:Inpatient level of care appropriate due to severity of illness  Dispo: The patient is from: Home              Anticipated d/c is to: ? Snf/Long term care              Patient currently is not medically stable to d/c.   Difficult to place patient No  Consultants:  SLP Palliative medicine  Procedures:   Antimicrobials:    Subjective: -Now comfort measures, resting in bed, somnolent but arousable  Objective: Vitals:   06/16/21 1420 06/16/21 2000 06/17/21 0000 06/17/21 0500  BP:  139/79  (!) 197/79  Pulse:  95  76  Resp:      Temp:  (!) 97.5 F (36.4 C)    TempSrc:  Axillary    SpO2: 98% 96% 94% 94%  Weight:      Height:        Intake/Output Summary (Last 24 hours) at 06/17/2021 1029 Last data filed at 06/16/2021 1100 Gross per 24 hour  Intake --  Output 600 ml  Net -600 ml   Filed Weights   05/20/2021 1017  Weight: 65.8 kg    Examination:  General exam: Chronically ill debilitated male laying in bed, obtunded but opens eyes and answers few questions somnolent but easily arousable, oriented  to self only, cognitive deficits noted CVS: S1-S2, regular rate rhythm Lungs: Extensive scattered rhonchi and conducted upper airway sounds  Abdomen: Soft, nontender, bowel sounds present Extremities: No edema Skin: No rashes on exposed skin Psychiatry: Poor insight and  judgment  Data Reviewed:   CBC: Recent Labs  Lab 05/23/2021 1019 06/14/21 0431 06/15/21 0213 06/16/21 0334  WBC 5.3 15.5* 9.9 9.8  NEUTROABS 3.2 13.0*  --   --   HGB 13.6 14.6 13.4 13.5  HCT 42.1 43.4 40.1 40.4  MCV 107.4* 104.8* 104.4* 104.9*  PLT 211 197 186 956   Basic Metabolic Panel: Recent Labs  Lab 06/07/2021 1019 06/15/21 0213 06/16/21 0334  NA 135 139 144  K 4.4 3.8 3.8  CL 100 106 110  CO2 28 24 27   GLUCOSE 92 104* 125*  BUN 28* 24* 25*  CREATININE 1.06 0.90 0.92  CALCIUM 9.2 8.6* 8.7*   GFR: Estimated Creatinine Clearance: 44.7 mL/min (by C-G formula based on SCr of 0.92 mg/dL). Liver Function Tests: No results for input(s): AST, ALT, ALKPHOS, BILITOT, PROT, ALBUMIN in the last 168 hours. No results for input(s): LIPASE, AMYLASE in the last 168 hours. No results for input(s): AMMONIA in the last 168 hours. Coagulation Profile: No results for input(s): INR, PROTIME in the last 168 hours. Cardiac Enzymes: No results for input(s): CKTOTAL, CKMB, CKMBINDEX, TROPONINI in the last 168 hours. BNP (last 3 results) No results for input(s): PROBNP in the last 8760 hours. HbA1C: No results for input(s): HGBA1C in the last 72 hours. CBG: Recent Labs  Lab 06/01/2021 1025  GLUCAP 94   Lipid Profile: No results for input(s): CHOL, HDL, LDLCALC, TRIG, CHOLHDL, LDLDIRECT in the last 72 hours. Thyroid Function Tests: No results for input(s): TSH, T4TOTAL, FREET4, T3FREE, THYROIDAB in the last 72 hours. Anemia Panel: No results for input(s): VITAMINB12, FOLATE, FERRITIN, TIBC, IRON, RETICCTPCT in the last 72 hours. Urine analysis:    Component Value Date/Time   COLORURINE YELLOW 06/02/2021 1256   APPEARANCEUR HAZY (A) 06/12/2021 1256   LABSPEC 1.014 05/28/2021 1256   PHURINE 8.0 06/10/2021 1256   GLUCOSEU NEGATIVE 05/21/2021 1256   HGBUR NEGATIVE 05/24/2021 1256   [place] 05/22/2021 1256   KETONESUR NEGATIVE 06/15/2021 1256   PROTEINUR NEGATIVE  05/18/2021 1256   NITRITE NEGATIVE 05/19/2021 1256   LEUKOCYTESUR NEGATIVE 05/25/2021 1256   Sepsis Labs: @LABRCNTIP (procalcitonin:4,lacticidven:4)  ) Recent Results (from the past 240 hour(s))  SARS CORONAVIRUS 2 (TAT 6-24 HRS) Nasopharyngeal Nasopharyngeal Swab     Status: None   Collection Time: 05/21/2021  5:48 PM   Specimen: Nasopharyngeal Swab  Result Value Ref Range Status   SARS Coronavirus 2 NEGATIVE NEGATIVE Final    Comment: (NOTE) SARS-CoV-2 target nucleic acids are NOT DETECTED.  The SARS-CoV-2 RNA is generally detectable in upper and lower respiratory specimens during the acute phase of infection. Negative results do not preclude SARS-CoV-2 infection, do not rule out co-infections with other pathogens, and should not be used as the sole basis for treatment or other patient management decisions. Negative results must be combined with clinical observations, patient history, and epidemiological information. The expected result is Negative.  Fact Sheet for Patients: SugarRoll.be  Fact Sheet for Healthcare Providers: https://www.woods-mathews.com/  This test is not yet approved or cleared by the [place] FDA and  has been authorized for detection and/or diagnosis of SARS-CoV-2 by FDA under an Emergency Use Authorization (EUA). This EUA will remain  in effect (meaning this test can be used) for  the duration of the COVID-19 declaration under Se ction 564(b)(1) of the Act, 21 U.S.C. section 360bbb-3(b)(1), unless the authorization is terminated or revoked sooner.  Performed at [facility], [place] [address] [place], [place] [address]      Scheduled Meds:  lidocaine  1 patch Transdermal Q24H   Continuous Infusions:     LOS: 4 days    Time spent: 49min  Domenic Polite, MD [facility]   06/17/2021, 10:29 AM

## 2021-06-17 NOTE — Plan of Care (Signed)

## 2021-06-17 DEATH — deceased

## 2021-06-18 MED ORDER — LORAZEPAM 2 MG/ML IJ SOLN
1.0000 mg | INTRAMUSCULAR | Status: DC | PRN
  Administered 2021-06-18 – 2021-06-20 (×3): 1 mg via INTRAVENOUS
  Filled 2021-06-18 (×4): qty 1

## 2021-06-18 NOTE — Progress Notes (Signed)
Patient seen and examined, remains obtunded, minimally responsive, vitals are stable -Continue comfort measures -Anticipate hospital demise, otherwise if he stabilizes may need to consider residential hospice  Domenic Polite, MD

## 2021-06-19 DIAGNOSIS — W19XXXA Unspecified fall, initial encounter: Secondary | ICD-10-CM | POA: Diagnosis not present

## 2021-06-19 DIAGNOSIS — T17908D Unspecified foreign body in respiratory tract, part unspecified causing other injury, subsequent encounter: Secondary | ICD-10-CM

## 2021-06-19 DIAGNOSIS — S0990XA Unspecified injury of head, initial encounter: Secondary | ICD-10-CM | POA: Diagnosis not present

## 2021-06-19 DIAGNOSIS — S51812A Laceration without foreign body of left forearm, initial encounter: Secondary | ICD-10-CM | POA: Diagnosis not present

## 2021-06-19 DIAGNOSIS — R531 Weakness: Secondary | ICD-10-CM | POA: Diagnosis not present

## 2021-06-19 MED ORDER — SCOPOLAMINE 1 MG/3DAYS TD PT72
1.0000 | MEDICATED_PATCH | TRANSDERMAL | Status: DC
  Administered 2021-06-19 – 2021-06-22 (×2): 1.5 mg via TRANSDERMAL
  Filled 2021-06-19 (×2): qty 1

## 2021-06-19 MED ORDER — LORAZEPAM 2 MG/ML IJ SOLN
1.0000 mg | Freq: Three times a day (TID) | INTRAMUSCULAR | Status: DC
  Administered 2021-06-19 – 2021-06-22 (×11): 1 mg via INTRAVENOUS
  Filled 2021-06-19 (×10): qty 1

## 2021-06-19 NOTE — Progress Notes (Signed)
   Palliative Medicine Inpatient Follow Up Note      Chart Reviewed. Patient assessed at the bedside. Unresponsive. Appears comfortable. Some audible congestion. Daughters are at the bedside. Updates and support provided.   Eduction provided to family on end-of-life expectations and changes. Daughters verbalized understanding and appreciation. Patient may be appropriate for residential hospice however will continue to observe symptom burden and stability over the next 24 hours and further discuss with family if transfer is most appropriate.    Questions addressed and support provided.    Objective Assessment: Vital Signs Vitals:   06/19/21 0345 06/19/21 0744  BP:  (!) 162/81  Pulse:  70  Resp:  14  Temp:  98.4 F (36.9 C)  SpO2: 97% 93%   No intake or output data in the 24 hours ending 06/19/21 1038 Last Weight  Most recent update: 06/10/2021 10:18 AM    Weight  65.8 kg (145 lb)            Gen:  Obtunded, cachectic, ill appearing  CV: RRR PULM: coarse bilaterally, 2L/[place]  ABD: soft/nontender/nondistended/hypoactive  Neuro: Unresponsive   SUMMARY OF RECOMMENDATIONS   Continue comfort focused care Education provided to family regarding ongoing symptom management and EOL expectations. Will continue to evaluate over the next 24 hrs for appropriateness of hospice home consideration.  Scopolamine patch for secretions   PMT will continue to support and follow on as needed basis. Please secure chat for urgent needs.   Time Total: 40 min.   Visit consisted of counseling and education dealing with the complex and emotionally intense issues of symptom management and palliative care in the setting of serious and potentially life-threatening illness.Greater than 50%  of this time was spent counseling and coordinating care related to the above assessment and plan.  Alda Lea, AGPCNP-BC  Palliative Medicine Team [phone]  Palliative Medicine Team providers are  available by phone from 7am to 7pm daily and can be reached through the team cell phone. Should this patient require assistance outside of these hours, please call the patient's attending physician.

## 2021-06-19 NOTE — Progress Notes (Signed)
Patient seen and examined, remains obtunded minimally responsive, vital signs are stable, both daughters at bedside -Continue comfort measures -Continue to anticipate hospital demise, may need to consider residential hospice later this week if his vitals continue to be stable  Domenic Polite, MD

## 2021-06-20 DIAGNOSIS — W19XXXA Unspecified fall, initial encounter: Secondary | ICD-10-CM | POA: Diagnosis not present

## 2021-06-20 DIAGNOSIS — Z515 Encounter for palliative care: Secondary | ICD-10-CM | POA: Diagnosis not present

## 2021-06-20 DIAGNOSIS — E440 Moderate protein-calorie malnutrition: Secondary | ICD-10-CM | POA: Diagnosis not present

## 2021-06-20 DIAGNOSIS — S0990XA Unspecified injury of head, initial encounter: Secondary | ICD-10-CM | POA: Diagnosis not present

## 2021-06-20 DIAGNOSIS — R531 Weakness: Secondary | ICD-10-CM | POA: Diagnosis not present

## 2021-06-20 DIAGNOSIS — S51812A Laceration without foreign body of left forearm, initial encounter: Secondary | ICD-10-CM | POA: Diagnosis not present

## 2021-06-20 NOTE — TOC Progression Note (Signed)
Transition of Care [facility]) - Progression Note    Patient Details  Name: Ricky [name] MRN: [id number] Date of Birth: 08-23-1923  Transition of Care [facility]) CM/SW Contact  Sharin Mons, RN Phone Number: 06/20/2021, 12:58 PM  Clinical Narrative:    Consult received :Daughter is requesting [employer].  NCM spoke with  Festus Holts (daughter) @ bedside. Festus Holts confirmed transition plan for [employer].  Referral made with [facility] for residential hospice.  TOC team will continue to monitor and assist with needs.  Expected Discharge Plan: Hospice Medical Facility Barriers to Discharge: Other (must enter comment) (bed availability and approval)  Expected Discharge Plan and Services Expected Discharge Plan: [place]                                               Social Determinants of Health (SDOH) Interventions    Readmission Risk Interventions No flowsheet data found.

## 2021-06-20 NOTE — Progress Notes (Signed)
PROGRESS NOTE    Ricky [name]  ZOX:[id number] DOB: 08/06/1923 DOA: 05/24/2021 PCP: Cassandria Anger, MD  Brief Narrative: 87 year old male with history of chronic A. fib, PPM, on Eliquis, chronic diastolic CHF, hypertension, hypothyroidism, chronic gait disorder/ambulatory dysfunction presented to the ED with generalized weakness and fall yesterday. -According to patient's son he lives with a daughter, ambulates with a walker but for the most part is somewhat independent, patient reports chronic right knee pain, bilateral feet numbness for months, feels unsteady and getting weaker over the last 1 week or so, switched from showers to sponge baths. -9/27 reportedly fell while trying to stand up and hit his head on the monitor. -Also reports history of dysphagia, having to chew his food slowly, intermittent coughing and choking after foods especially liquids ongoing for years but worse in the last week or so. -In the ED work-up was unremarkable, CT head and labs were unrevealing -While in the ED he was noted to have an aspiration event followed by severe hypoxia   Assessment & Plan:   Acute hypoxic respiratory failure Aspiration pneumonia Dysphagia, acute on chronic -SLP evaluation 9/28 noted severe oropharyngeal dysphagia, suspected to be chronic, likely has been compensating at baseline for a while now but progressively worsening -Had 2 episodes of respiratory distress and profound hypoxia following aspiration -Followed by palliative care this admission, now comfort care -Continue comfort measures, plan for transition to residential hospice noted  Delirium -In setting of advanced age, pneumonia, hospitalization  Gait disorder Ambulatory dysfunction -Progressive worsening, CT head unremarkable  Moderate protein calorie malnutrition -Now comfort care  Chronic A. Fib -Now comfort care  Chronic diastolic CHF -Discontinued torsemide and Aldactone  Chronic  constipation -Comfort care  DVT prophylaxis: none Code Status: DNR Family Communication: Discussed with daughter earlier Disposition Plan:  Status is: Inpatient  Remains inpatient appropriate because:Inpatient level of care appropriate due to severity of illness  Dispo: The patient is from: Home              Anticipated d/c is to: [facility]              Patient currently is medically stable to d/c.   Difficult to place patient No  Consultants:  SLP Palliative medicine  Procedures:   Antimicrobials:    Subjective: -No events overnight,, vitals remained stable  Objective: Vitals:   06/18/21 2042 06/19/21 0345 06/19/21 0744 06/19/21 2200  BP: (!) 193/77  (!) 162/81 (!) 150/86  Pulse: 66  70 67  Resp: (!) 28  14 14   Temp: (!) 97.5 F (36.4 C)  98.4 F (36.9 C) 98.5 F (36.9 C)  TempSrc: Axillary  Oral Axillary  SpO2: 94% 97% 93% 95%  Weight:      Height:        Intake/Output Summary (Last 24 hours) at 06/20/2021 1347 Last data filed at 06/20/2021 0600 Gross per 24 hour  Intake --  Output 300 ml  Net -300 ml   Filed Weights   06/02/2021 1017  Weight: 65.8 kg    Examination:  General exam: Chronically ill debilitated male laying in bed, obtunded, minimally responsive, resting comfortably  Physical exam not performed   Data Reviewed:   CBC: Recent Labs  Lab 06/14/21 0431 06/15/21 0213 06/16/21 0334  WBC 15.5* 9.9 9.8  NEUTROABS 13.0*  --   --   HGB 14.6 13.4 13.5  HCT 43.4 40.1 40.4  MCV 104.8* 104.4* 104.9*  PLT 197 186 811   Basic Metabolic Panel:  Recent Labs  Lab 06/15/21 0213 06/16/21 0334  NA 139 144  K 3.8 3.8  CL 106 110  CO2 24 27  GLUCOSE 104* 125*  BUN 24* 25*  CREATININE 0.90 0.92  CALCIUM 8.6* 8.7*   GFR: Estimated Creatinine Clearance: 44.7 mL/min (by C-G formula based on SCr of 0.92 mg/dL). Liver Function Tests: No results for input(s): AST, ALT, ALKPHOS, BILITOT, PROT, ALBUMIN in the last 168 hours. No results for  input(s): LIPASE, AMYLASE in the last 168 hours. No results for input(s): AMMONIA in the last 168 hours. Coagulation Profile: No results for input(s): INR, PROTIME in the last 168 hours. Cardiac Enzymes: No results for input(s): CKTOTAL, CKMB, CKMBINDEX, TROPONINI in the last 168 hours. BNP (last 3 results) No results for input(s): PROBNP in the last 8760 hours. HbA1C: No results for input(s): HGBA1C in the last 72 hours. CBG: No results for input(s): GLUCAP in the last 168 hours.  Lipid Profile: No results for input(s): CHOL, HDL, LDLCALC, TRIG, CHOLHDL, LDLDIRECT in the last 72 hours. Thyroid Function Tests: No results for input(s): TSH, T4TOTAL, FREET4, T3FREE, THYROIDAB in the last 72 hours. Anemia Panel: No results for input(s): VITAMINB12, FOLATE, FERRITIN, TIBC, IRON, RETICCTPCT in the last 72 hours. Urine analysis:    Component Value Date/Time   COLORURINE YELLOW 06/12/2021 1256   APPEARANCEUR HAZY (A) 06/11/2021 1256   LABSPEC 1.014 06/04/2021 1256   PHURINE 8.0 06/07/2021 1256   GLUCOSEU NEGATIVE 06/03/2021 1256   HGBUR NEGATIVE 05/25/2021 1256   [place] 06/06/2021 1256   KETONESUR NEGATIVE 05/20/2021 1256   PROTEINUR NEGATIVE 06/02/2021 1256   NITRITE NEGATIVE 05/23/2021 1256   LEUKOCYTESUR NEGATIVE 06/14/2021 1256   Sepsis Labs: @LABRCNTIP (procalcitonin:4,lacticidven:4)  ) Recent Results (from the past 240 hour(s))  SARS CORONAVIRUS 2 (TAT 6-24 HRS) Nasopharyngeal Nasopharyngeal Swab     Status: None   Collection Time: 05/20/2021  5:48 PM   Specimen: Nasopharyngeal Swab  Result Value Ref Range Status   SARS Coronavirus 2 NEGATIVE NEGATIVE Final    Comment: (NOTE) SARS-CoV-2 target nucleic acids are NOT DETECTED.  The SARS-CoV-2 RNA is generally detectable in upper and lower respiratory specimens during the acute phase of infection. Negative results do not preclude SARS-CoV-2 infection, do not rule out co-infections with other pathogens, and  should not be used as the sole basis for treatment or other patient management decisions. Negative results must be combined with clinical observations, patient history, and epidemiological information. The expected result is Negative.  Fact Sheet for Patients: SugarRoll.be  Fact Sheet for Healthcare Providers: https://www.woods-mathews.com/  This test is not yet approved or cleared by the [place] FDA and  has been authorized for detection and/or diagnosis of SARS-CoV-2 by FDA under an Emergency Use Authorization (EUA). This EUA will remain  in effect (meaning this test can be used) for the duration of the COVID-19 declaration under Se ction 564(b)(1) of the Act, 21 U.S.C. section 360bbb-3(b)(1), unless the authorization is terminated or revoked sooner.  Performed at [facility], [place] [address], [place], [place] [address]      Scheduled Meds:  glycopyrrolate  0.4 mg Intravenous Q4H   lidocaine  1 patch Transdermal Q24H   LORazepam  1 mg Intravenous TID   scopolamine  1 patch Transdermal Q72H   Continuous Infusions:     LOS: 7 days    Time spent: 99min  Domenic Polite, MD [facility]   06/20/2021, 1:47 PM

## 2021-06-20 NOTE — Progress Notes (Signed)
   Palliative Medicine Inpatient Follow Up Note      Chart Reviewed. Patient assessed at the bedside.   Continued comfort care measures. Obtunded. Daughter is at the bedside. Updates and support provided.   Discussed at length patient's stability for hospice home consideration. She verbalized understanding sharing previous experiences with her mother at [facility]. Support provided.   Education provided on hospice's goals of care and continued support to patient and family in addition to referral process.   Ella verbalized appreciation of care received. She is appreciative of symptom management and patient's much calmer and peaceful state. She confirms wishes for [facility] referral as preferred location to continue her father's care during his last days.     Questions addressed and support provided.    Objective Assessment: Vital Signs Vitals:   06/19/21 0744 06/19/21 2200  BP: (!) 162/81 (!) 150/86  Pulse: 70 67  Resp: 14 14  Temp: 98.4 F (36.9 C) 98.5 F (36.9 C)  SpO2: 93% 95%    Intake/Output Summary (Last 24 hours) at 06/20/2021 1244 Last data filed at 06/20/2021 0600 Gross per 24 hour  Intake --  Output 300 ml  Net -300 ml   Last Weight  Most recent update: 06/12/2021 10:18 AM    Weight  65.8 kg (145 lb)            Gen:  Obtunded, cachectic, comfort care   CV: RRR PULM: diminished, room air  ABD: soft/nontender/nondistended/hypoactive  Neuro: Unresponsive   SUMMARY OF RECOMMENDATIONS   Continue comfort focused care Education provided to family regarding ongoing symptom management, EOL expectations, and appropriateness for consideration of residential hospice. Daughter, Festus Holts requested referral to [facility].  Levada Dy, RN CM aware of hospice home referral and is communicating with family and [facility].  Continued ongoing symptom management.  PMT will continue to support and follow on as needed basis. Please secure chat for urgent needs.   Time Total: 35  min.   Visit consisted of counseling and education dealing with the complex and emotionally intense issues of symptom management and palliative care in the setting of serious and potentially life-threatening illness.Greater than 50%  of this time was spent counseling and coordinating care related to the above assessment and plan.  Alda Lea, AGPCNP-BC  Palliative Medicine Team [phone]   Palliative Medicine Team providers are available by phone from 7am to 7pm daily and can be reached through the team cell phone. Should this patient require assistance outside of these hours, please call the patient's attending physician.

## 2021-06-20 NOTE — Progress Notes (Signed)
Nutrition Brief Note  Chart reviewed. Pt now transitioning to comfort care.  No further nutrition interventions planned at this time.  Please re-consult as needed.   [name], RD, LDN, CDCES Registered Dietitian II Certified Diabetes Care and Education Specialist Please refer to [facility] for RD and/or RD on-call/weekend/after hours pager   

## 2021-06-20 NOTE — Plan of Care (Signed)
  Problem: Pain Managment: Goal: General experience of comfort will improve Outcome: Progressing   

## 2021-06-20 NOTE — Progress Notes (Signed)
Manufacturing engineer [facility]) Liaison Note  Received request from [facility] for family interest in [place]. Chart and pt information have been reviewed by [facility] physician.  [facility] eligibility confirmed.  [place] is unable to offer a room today. Hospital Liaison will follow up tomorrow or sooner if a room becomes available. Please do not hesitate to call with questions.    Thank you for the opportunity to participate in this patient's care.  Domenic Moras, BSN, RN [facility] Liaison (listed on [place] under Hospice/[facility])    [phone] (24h on call)

## 2021-06-21 DIAGNOSIS — Z515 Encounter for palliative care: Secondary | ICD-10-CM | POA: Diagnosis not present

## 2021-06-21 DIAGNOSIS — R531 Weakness: Secondary | ICD-10-CM | POA: Diagnosis not present

## 2021-06-21 DIAGNOSIS — Z7189 Other specified counseling: Secondary | ICD-10-CM | POA: Diagnosis not present

## 2021-06-21 DIAGNOSIS — S0990XA Unspecified injury of head, initial encounter: Secondary | ICD-10-CM | POA: Diagnosis not present

## 2021-06-21 DIAGNOSIS — W19XXXA Unspecified fall, initial encounter: Secondary | ICD-10-CM | POA: Diagnosis not present

## 2021-06-21 DIAGNOSIS — E440 Moderate protein-calorie malnutrition: Secondary | ICD-10-CM | POA: Diagnosis not present

## 2021-06-21 NOTE — Progress Notes (Signed)
Daily Progress Note   Patient Name: Ricky [name]       Date: 06/21/2021 DOB: 03-15-1923  Age: 87 y.o. MRN#: [id number] Attending Physician: Domenic Polite, MD Primary Care Physician: Cassandria Anger, MD Admit Date: 06/06/2021 Length of Stay: 8 days  Reason for Consultation/Follow-up: Establishing goals of care  HPI/Patient Profile:  Palliative Care consult requested for goals of care discussion in this 87 y.o. male  with past medical history of falls, atrial fibrillation (Eliquis) s/p PPM, hypertension, diasatolic CHF, hypothyroidism, and chronic ambulation dysfunction. He was admitted on 05/22/2021 from home with generalized s/p fall. CT head and x-rays negative for acute findings. Patient being followed by SLP in the setting of dysphagia with recommendations for dysphagia nectar thick diet. Aspiration precautions. Given patient decline, he was made comfort care.  Subjective:   Subjective: Chart Reviewed. Updates received. Patient Assessed. Created space and opportunity for patient  and family to explore thoughts and feelings regarding current medical situation.  Today's Discussion: Saw the patient at the bedside, unresponsive. Spoke with nursing staff about the patient's current state and how he has been during the shift. Confirms that he remains unresponsive. No overt signs that he is having any pain, dyspnea, anxiety. Nursing has been giving him scheduled medications for pain/dyspnea and anxiety given that he is not able to communicate, to ensure comfort.  Reviewed [facility] note and patient's family's wishes to not transport to [facility] in his current state, high risk for intra-transport demise.  Review of Systems  Unable to perform ROS: Patient unresponsive   Objective:   Vital Signs:  BP 138/64 (BP Location: Left Arm)   Pulse 68   Temp (!) 97.5 F (36.4 C) (Tympanic)   Resp (!) 25   Ht 5\' 10"  (1.778 m)   Wt 65.8 kg   SpO2 97%   BMI 20.81 kg/m   Physical  Exam: Physical Exam Vitals and nursing note reviewed.  Constitutional:      Appearance: He is ill-appearing.  HENT:     Head: Normocephalic and atraumatic.  Cardiovascular:     Rate and Rhythm: Normal rate.  Pulmonary:     Effort: Pulmonary effort is normal. No respiratory distress.  Abdominal:     General: Abdomen is flat.     Palpations: Abdomen is soft.  Skin:    General: Skin is warm and dry.  Neurological:     Mental Status: He is unresponsive.    Palliative Assessment/Data: 10%   Assessment & Plan:   Impression: Present on Admission:  Fall  Aspiration into airway  He is a 87 year old male at end of life, actively dying. No unresponsive, on comfort care. They have declined placement at [facility] in his current state given risk for intra-transport demise; family prefer for him to complete his end of life journey as an inpatient, which I feel is reasonable. Is getting scheduled pain/dyspnea and anxiety medications to ensure comfort given that he is unresponsive. Continue comfort care.  SUMMARY OF RECOMMENDATIONS   Continue comfort care Nursing to notify PMT if any signs of distress or discomfort PMT to continue to follow  Code Status: DNR  Prognosis: Hours - Days  Discharge Planning: Anticipated Hospital Death  Discussed with: Nursing staff, medical team  Thank you for allowing [place] to participate in the care of KEMONTAE DUNKLEE PMT will continue to support holistically.  Time Total: 25 min  Visit consisted of counseling and education dealing with the complex and emotionally intense issues  of symptom management and palliative care in the setting of serious and potentially life-threatening illness. Greater than 50%  of this time was spent counseling and coordinating care related to the above assessment and plan.  Walden Field, NP Palliative Medicine Team  Team Phone # [phone] (Nights/Weekends)  05/16/2021, 8:17 AM

## 2021-06-21 NOTE — Progress Notes (Signed)
Manufacturing engineer [facility]) Hospital Liaison note  Chart reviewed and eligibility confirmed for [facility]. Spoke with family to explain services at [facility] and to offer a bed for transport today.  Family discussed the pros and cons of moving pt and decided they did not wish to transport him at this stage.  Family wishes for pt to remain in place for end of life.  TOC made aware.  Thank you for the opportunity to participate in this patient's care.  Domenic Moras, BSN, RN [facility] Liaison (listed on [place] under Hospice/[facility])    [phone] (24h on call)

## 2021-06-21 NOTE — Progress Notes (Signed)
PROGRESS NOTE    Ricky [name]  ENI:[id number] DOB: [birth year] DOA: 06/16/2021 PCP: Cassandria Anger, MD  Brief Narrative: 87 year old male with history of chronic A. fib, PPM, on Eliquis, chronic diastolic CHF, hypertension, hypothyroidism, chronic gait disorder/ambulatory dysfunction presented to the ED with generalized weakness and fall yesterday. -According to patient's son he lives with a daughter, ambulates with a walker but for the most part is somewhat independent, patient reports chronic right knee pain, bilateral feet numbness for months, feels unsteady and getting weaker over the last 1 week or so, switched from showers to sponge baths. -9/27 reportedly fell while trying to stand up and hit his head on the monitor. -Also reports history of dysphagia, having to chew his food slowly, intermittent coughing and choking after foods especially liquids ongoing for years but worse in the last week or so. -In the ED work-up was unremarkable, CT head and labs were unrevealing -While in the ED he was noted to have an aspiration event followed by severe hypoxia   Assessment & Plan:   Acute hypoxic respiratory failure Aspiration pneumonia Dysphagia, acute on chronic -SLP evaluation 9/28 noted severe oropharyngeal dysphagia, suspected to be chronic, likely has been compensating at baseline for a while now but progressively worsening -During this admission had 2 episodes of respiratory distress and profound hypoxia following aspiration -Followed by palliative care this admission, now comfort care -Continue comfort measures, residential hospice versus inpatient demise  Delirium -In setting of advanced age, pneumonia, hospitalization  Gait disorder Ambulatory dysfunction -Progressive worsening, CT head unremarkable  Moderate protein calorie malnutrition -Now comfort care  Chronic A. Fib -Now comfort care  Chronic diastolic CHF -Discontinued torsemide and Aldactone  Chronic  constipation -Comfort care  DVT prophylaxis: none Code Status: DNR Family Communication: Discussed with daughter 2 days ago Disposition Plan:  Status is: Inpatient  Remains inpatient appropriate because:Inpatient level of care appropriate due to severity of illness  Dispo: The patient is from: Home              Anticipated d/c is to: [facility]              Patient currently is medically stable to d/c.   Difficult to place patient No  Consultants:  SLP Palliative medicine  Procedures:   Antimicrobials:    Subjective: -No events overnight, completely unresponsive now  Objective: Vitals:   06/19/21 2200 06/20/21 2121 06/20/21 2216 06/21/21 0400  BP: (!) 150/86 126/63 138/64   Pulse: 67 70 68   Resp: 14 16 (!) 25   Temp: 98.5 F (36.9 C) 98.8 F (37.1 C) (!) 97.5 F (36.4 C)   TempSrc: Axillary Oral Tympanic   SpO2: 95% 97% 98% 97%  Weight:      Height:        Intake/Output Summary (Last 24 hours) at 06/21/2021 1402 Last data filed at 06/21/2021 0900 Gross per 24 hour  Intake 0 ml  Output --  Net 0 ml   Filed Weights   05/29/2021 1017  Weight: 65.8 kg    Examination:  General exam: Chronically ill debilitated male laying in bed, unresponsive and pale Comprehensive physical exam not performed  Data Reviewed:   CBC: Recent Labs  Lab 06/15/21 0213 06/16/21 0334  WBC 9.9 9.8  HGB 13.4 13.5  HCT 40.1 40.4  MCV 104.4* 104.9*  PLT 186 614   Basic Metabolic Panel: Recent Labs  Lab 06/15/21 0213 06/16/21 0334  NA 139 144  K 3.8 3.8  CL  106 110  CO2 24 27  GLUCOSE 104* 125*  BUN 24* 25*  CREATININE 0.90 0.92  CALCIUM 8.6* 8.7*   GFR: Estimated Creatinine Clearance: 44.7 mL/min (by C-G formula based on SCr of 0.92 mg/dL). Liver Function Tests: No results for input(s): AST, ALT, ALKPHOS, BILITOT, PROT, ALBUMIN in the last 168 hours. No results for input(s): LIPASE, AMYLASE in the last 168 hours. No results for input(s): AMMONIA in the last  168 hours. Coagulation Profile: No results for input(s): INR, PROTIME in the last 168 hours. Cardiac Enzymes: No results for input(s): CKTOTAL, CKMB, CKMBINDEX, TROPONINI in the last 168 hours. BNP (last 3 results) No results for input(s): PROBNP in the last 8760 hours. HbA1C: No results for input(s): HGBA1C in the last 72 hours. CBG: No results for input(s): GLUCAP in the last 168 hours.  Lipid Profile: No results for input(s): CHOL, HDL, LDLCALC, TRIG, CHOLHDL, LDLDIRECT in the last 72 hours. Thyroid Function Tests: No results for input(s): TSH, T4TOTAL, FREET4, T3FREE, THYROIDAB in the last 72 hours. Anemia Panel: No results for input(s): VITAMINB12, FOLATE, FERRITIN, TIBC, IRON, RETICCTPCT in the last 72 hours. Urine analysis:    Component Value Date/Time   COLORURINE YELLOW 05/30/2021 1256   APPEARANCEUR HAZY (A) 05/19/2021 1256   LABSPEC 1.014 06/12/2021 1256   PHURINE 8.0 05/31/2021 1256   GLUCOSEU NEGATIVE 05/18/2021 1256   HGBUR NEGATIVE 06/08/2021 1256   [place] 06/12/2021 1256   KETONESUR NEGATIVE 05/26/2021 1256   PROTEINUR NEGATIVE 06/10/2021 1256   NITRITE NEGATIVE 06/12/2021 1256   LEUKOCYTESUR NEGATIVE 06/14/2021 1256   Sepsis Labs: @LABRCNTIP (procalcitonin:4,lacticidven:4)  ) Recent Results (from the past 240 hour(s))  SARS CORONAVIRUS 2 (TAT 6-24 HRS) Nasopharyngeal Nasopharyngeal Swab     Status: None   Collection Time: 06/12/2021  5:48 PM   Specimen: Nasopharyngeal Swab  Result Value Ref Range Status   SARS Coronavirus 2 NEGATIVE NEGATIVE Final    Comment: (NOTE) SARS-CoV-2 target nucleic acids are NOT DETECTED.  The SARS-CoV-2 RNA is generally detectable in upper and lower respiratory specimens during the acute phase of infection. Negative results do not preclude SARS-CoV-2 infection, do not rule out co-infections with other pathogens, and should not be used as the sole basis for treatment or other patient management  decisions. Negative results must be combined with clinical observations, patient history, and epidemiological information. The expected result is Negative.  Fact Sheet for Patients: SugarRoll.be  Fact Sheet for Healthcare Providers: https://www.woods-mathews.com/  This test is not yet approved or cleared by the [place] FDA and  has been authorized for detection and/or diagnosis of SARS-CoV-2 by FDA under an Emergency Use Authorization (EUA). This EUA will remain  in effect (meaning this test can be used) for the duration of the COVID-19 declaration under Se ction 564(b)(1) of the Act, 21 U.S.C. section 360bbb-3(b)(1), unless the authorization is terminated or revoked sooner.  Performed at [facility], [place] [address] [place], [place] [address]      Scheduled Meds:  glycopyrrolate  0.4 mg Intravenous Q4H   lidocaine  1 patch Transdermal Q24H   LORazepam  1 mg Intravenous TID   scopolamine  1 patch Transdermal Q72H   Continuous Infusions:     LOS: 8 days    Time spent: 61min  Domenic Polite, MD [facility]   06/21/2021, 2:02 PM

## 2021-06-22 DIAGNOSIS — Z515 Encounter for palliative care: Secondary | ICD-10-CM | POA: Diagnosis not present

## 2021-06-22 DIAGNOSIS — T17908D Unspecified foreign body in respiratory tract, part unspecified causing other injury, subsequent encounter: Secondary | ICD-10-CM | POA: Diagnosis not present

## 2021-06-22 NOTE — Progress Notes (Signed)
[facility]  PROGRESS NOTE  KANO HECKMANN WNU:[id number] DOB: 1923-07-01 DOA: 06/02/2021 PCP: Cassandria Anger, MD   Brief HPI:   87 year old male with history of chronic A. fib, PPM, on Eliquis, chronic diastolic CHF, hypertension, hypothyroidism, chronic gait disorder/ambulatory dysfunction presented to the ED with generalized weakness and fall yesterday. -According to patient's son he lives with a daughter, ambulates with a walker but for the most part is somewhat independent, patient reports chronic right knee pain, bilateral feet numbness for months, feels unsteady and getting weaker over the last 1 week or so, switched from showers to sponge baths. -9/27 reportedly fell while trying to stand up and hit his head on the monitor. -Also reports history of dysphagia, having to chew his food slowly, intermittent coughing and choking after foods especially liquids ongoing for years but worse in the last week or so. -In the ED work-up was unremarkable, CT head and labs were unrevealing -While in the ED he was noted to have an aspiration event followed by severe hypoxia    Subjective   Patient seen, unresponsive.   Assessment/Plan:     Acute hypoxemic respiratory failure -Secondary to aspiration pneumonia -Acute on chronic dysphagia -Swallow evaluation performed on 9/28 showed severe oropharyngeal dysphagia, likely chronic -Has been compensating at baseline but now getting worse -He had 2 episodes of respiratory distress and profound hypoxemia following escalation -Palliative care was consulted -Patient made full comfort care  Moderate protein calorie malnutrition -Now comfort care  Chronic atrial fibrillation -On comfort measures  Chronic diastolic CHF -Aldactone and torsemide discontinued    Scheduled medications:    glycopyrrolate  0.4 mg Intravenous Q4H   lidocaine  1 patch Transdermal Q24H   LORazepam  1 mg Intravenous TID   scopolamine  1 patch  Transdermal Q72H     Data Reviewed:   CBG:  No results for input(s): GLUCAP in the last 168 hours.  SpO2: 94 % O2 Flow Rate (L/min): 2 L/min FiO2 (%): 100 %    Vitals:   06/20/21 2216 06/21/21 0400 06/22/21 0400 06/22/21 1234  BP: 138/64   114/62  Pulse: 68   70  Resp: (!) 25     Temp: (!) 97.5 F (36.4 C)   97.6 F (36.4 C)  TempSrc: Tympanic   Oral  SpO2: 98% 97% 96% 94%  Weight:      Height:        No intake or output data in the 24 hours ending 06/22/21 2011  No intake/output data recorded.  Filed Weights   06/12/2021 1017  Weight: 65.8 kg    Data Reviewed: Basic Metabolic Panel: Recent Labs  Lab 06/16/21 0334  NA 144  K 3.8  CL 110  CO2 27  GLUCOSE 125*  BUN 25*  CREATININE 0.92  CALCIUM 8.7*   Liver Function Tests: No results for input(s): AST, ALT, ALKPHOS, BILITOT, PROT, ALBUMIN in the last 168 hours. No results for input(s): LIPASE, AMYLASE in the last 168 hours. No results for input(s): AMMONIA in the last 168 hours. CBC: Recent Labs  Lab 06/16/21 0334  WBC 9.8  HGB 13.5  HCT 40.4  MCV 104.9*  PLT 183   Cardiac Enzymes: No results for input(s): CKTOTAL, CKMB, CKMBINDEX, TROPONINI in the last 168 hours. BNP (last 3 results) No results for input(s): BNP in the last 8760 hours.  ProBNP (last 3 results) No results for input(s): PROBNP in the last 8760 hours.  CBG: No results for input(s): GLUCAP in the  last 168 hours.     Radiology Reports  No results found.     Antibiotics: Anti-infectives (From admission, onward)    Start     Dose/Rate Route Frequency Ordered Stop   06/12/2021 1945  cefTRIAXone (ROCEPHIN) 2 g in sodium chloride 0.9 % 100 mL IVPB  Status:  Discontinued        2 g 200 mL/hr over 30 Minutes Intravenous Every 24 hours 05/22/2021 1934 06/16/21 1620         DVT prophylaxis: None, comfort measures  Code Status: Full code  Family Communication: Discussed with patient daughter at  bedside   Consultants: Palliative care  Procedures:     Objective    Physical Examination:   General-unresponsive Heart-S1-S2, regular, no murmur auscultated Lungs-clear to auscultation bilaterally, no wheezing or crackles auscultated Abdomen-soft, nontender, no organomegaly Extremities-no edema in the lower extremities Neuro-unresponsive  Status is: Inpatient  Dispo: The patient is from: Home              Anticipated d/c is to: Anticipate hospital death              Anticipated d/c date is: Anticipate hospital death                COVID-19 Labs  No results for input(s): DDIMER, FERRITIN, LDH, CRP in the last 72 hours.  Lab Results  Component Value Date   [place] NEGATIVE 06/11/2021            Recent Results (from the past 240 hour(s))  SARS CORONAVIRUS 2 (TAT 6-24 HRS) Nasopharyngeal Nasopharyngeal Swab     Status: None   Collection Time: 06/10/2021  5:48 PM   Specimen: Nasopharyngeal Swab  Result Value Ref Range Status   SARS Coronavirus 2 NEGATIVE NEGATIVE Final    Comment: (NOTE) SARS-CoV-2 target nucleic acids are NOT DETECTED.  The SARS-CoV-2 RNA is generally detectable in upper and lower respiratory specimens during the acute phase of infection. Negative results do not preclude SARS-CoV-2 infection, do not rule out co-infections with other pathogens, and should not be used as the sole basis for treatment or other patient management decisions. Negative results must be combined with clinical observations, patient history, and epidemiological information. The expected result is Negative.  Fact Sheet for Patients: SugarRoll.be  Fact Sheet for Healthcare Providers: https://www.woods-mathews.com/  This test is not yet approved or cleared by the [place] FDA and  has been authorized for detection and/or diagnosis of SARS-CoV-2 by FDA under an Emergency Use Authorization (EUA). This EUA will remain   in effect (meaning this test can be used) for the duration of the COVID-19 declaration under Se ction 564(b)(1) of the Act, 21 U.S.C. section 360bbb-3(b)(1), unless the authorization is terminated or revoked sooner.  Performed at [facility], [place] [address], [place], [place] [address]     [facility] If 7PM-7AM, please contact night-coverage at [email or web address], Office  [phone]   06/22/2021, 8:11 PM  LOS: 9 days

## 2021-06-23 DIAGNOSIS — W19XXXA Unspecified fall, initial encounter: Secondary | ICD-10-CM | POA: Diagnosis not present

## 2021-06-23 DIAGNOSIS — T17908D Unspecified foreign body in respiratory tract, part unspecified causing other injury, subsequent encounter: Secondary | ICD-10-CM | POA: Diagnosis not present

## 2021-06-23 DIAGNOSIS — R531 Weakness: Secondary | ICD-10-CM | POA: Diagnosis not present

## 2021-06-23 DIAGNOSIS — E440 Moderate protein-calorie malnutrition: Secondary | ICD-10-CM | POA: Diagnosis not present

## 2021-06-23 DIAGNOSIS — Z515 Encounter for palliative care: Secondary | ICD-10-CM | POA: Diagnosis not present

## 2021-06-23 MED ORDER — GLYCOPYRROLATE 0.2 MG/ML IJ SOLN
0.4000 mg | INTRAMUSCULAR | Status: DC | PRN
  Filled 2021-06-23: qty 2

## 2021-06-23 MED ORDER — LORAZEPAM 2 MG/ML IJ SOLN
1.0000 mg | Freq: Two times a day (BID) | INTRAMUSCULAR | Status: DC
  Administered 2021-06-23: 1 mg via INTRAVENOUS
  Filled 2021-06-23: qty 1

## 2021-06-23 NOTE — Progress Notes (Signed)
PROGRESS NOTE    Ricky [name]  OAC:[id number] DOB: Jun 05, 1923 DOA: 06/01/2021 PCP: Cassandria Anger, MD   Chief Complaint  Patient presents with   Level 2 fall   Brief Narrative:  87 yo m with hx atrial fib on eliquis, HFpEF, HTN, hypothyroidism, chronic gait disorder/ambulatory dysfunction who presented to the ED with generalized weakness and Alyssa Rotondo fall.  At baseline, lives with daughter and ambulates with walker, somewhat independent.  He's been getting weaker over the last week or so prior to admission.  His hospitalization was complicated by recurrent aspiration events with respiratory distress and profound hypoxia and Reginal Wojcicki decision was made to transition to comfort measures.    Assessment & Plan:   Active Problems:   Fall at home, initial encounter   Fall   Aspiration into airway   Malnutrition of moderate degree   End of life care  Acute hypoxemic respiratory failure -Secondary to aspiration pneumonia -Acute on chronic dysphagia -Swallow evaluation performed on 9/28 showed severe oropharyngeal dysphagia, likely chronic -Has been compensating at baseline but now getting worse -He had 2 episodes of respiratory distress and profound hypoxemia following escalation -Palliative care was consulted -Patient has been transitioned to comfort measures only - Avelyn Touch bit tachypneic today on my evaluation - discussed with daughter, will try prn morphine - if persistent need, may need gtt - discussed with RN (let family know overnight if change in status or he looks like he's declining)    Moderate protein calorie malnutrition -Now comfort care   Chronic atrial fibrillation -On comfort measures   Chronic diastolic CHF -Aldactone and torsemide discontinued  DVT prophylaxis:comfort Code Status: DNR Family Communication:daughter Disposition:   Status is: Inpatient  Remains inpatient appropriate because:Inpatient level of care appropriate due to severity of illness  Dispo:                Anticipated d/c is to:  anticipate in hospital death              Patient currently is not medically stable to d/c.   Difficult to place patient No       Consultants:  Palliative care  Procedures:  none  Antimicrobials:  Anti-infectives (From admission, onward)    Start     Dose/Rate Route Frequency Ordered Stop   05/23/2021 1945  cefTRIAXone (ROCEPHIN) 2 g in sodium chloride 0.9 % 100 mL IVPB  Status:  Discontinued        2 g 200 mL/hr over 30 Minutes Intravenous Every 24 hours 05/31/2021 1934 06/16/21 1620          Subjective: unresponsive  Objective: Vitals:   06/22/21 1234 06/22/21 2016 06/23/21 1500 06/23/21 1503  BP: 114/62 (!) 105/57 (!) 114/58 (!) 114/58  Pulse: 70 69 68 68  Resp:  (!) 22 17 17   Temp: 97.6 F (36.4 C) 97.8 F (36.6 C) 97.6 F (36.4 C) 97.6 F (36.4 C)  TempSrc: Oral Oral Axillary Oral  SpO2: 94% 96%  (!) 72%  Weight:      Height:        Intake/Output Summary (Last 24 hours) at 06/23/2021 1928 Last data filed at 06/23/2021 0900 Gross per 24 hour  Intake --  Output 300 ml  Net -300 ml   Filed Weights   06/12/2021 1017  Weight: 65.8 kg    Examination:   General exam: Appears calm, mildly tachypneic Respiratory system: mild tachypnea Central nervous system: unresponsive Extremities: no LEE Skin: No visible rashes, lesions or ulcers  Data Reviewed: I have personally reviewed following labs and imaging studies  CBC: No results for input(s): WBC, NEUTROABS, HGB, HCT, MCV, PLT in the last 168 hours.  Basic Metabolic Panel: No results for input(s): NA, K, CL, CO2, GLUCOSE, BUN, CREATININE, CALCIUM, MG, PHOS in the last 168 hours.  GFR: Estimated Creatinine Clearance: 44.7 mL/min (by C-G formula based on SCr of 0.92 mg/dL).  Liver Function Tests: No results for input(s): AST, ALT, ALKPHOS, BILITOT, PROT, ALBUMIN in the last 168 hours.  CBG: No results for input(s): GLUCAP in the last 168 hours.   No results found for  this or any previous visit (from the past 240 hour(s)).       Radiology Studies: No results found.      Scheduled Meds:  LORazepam  1 mg Intravenous BID   scopolamine  1 patch Transdermal Q72H   Continuous Infusions:   LOS: 10 days    Time spent: over 30 min    Fayrene Helper, MD [facility]   To contact the attending provider between 7A-7P or the covering provider during after hours 7P-7A, please log into the web site [email or web address] and access using universal [place] password for that web site. If you do not have the password, please call the hospital operator.  06/23/2021, 7:28 PM

## 2021-06-23 NOTE — Progress Notes (Signed)
   Palliative Medicine Inpatient Follow Up Note     Chart Reviewed. Patient assessed at the bedside.   Patient remains unresponsive. Mouth breathing. Some changes in breathing pattern compared to previous assessments. Daughters are at the bedside.   Ongoing discussions regarding symptom management and expectations at end-of-life. We discussed continued care versus residential hospice. Family is realistically concerned about transferring to residential hospice with fear of death during transport.   Questions addressed and support provided.    Objective Assessment: Vital Signs Vitals:   06/22/21 1234 06/22/21 2016  BP: 114/62 (!) 105/57  Pulse: 70 69  Resp:  (!) 22  Temp: 97.6 F (36.4 C) 97.8 F (36.6 C)  SpO2: 94% 96%    Intake/Output Summary (Last 24 hours) at 06/23/2021 1547 Last data filed at 06/23/2021 0900 Gross per 24 hour  Intake --  Output 300 ml  Net -300 ml   Last Weight  Most recent update: 06/12/2021 10:18 AM    Weight  65.8 kg (145 lb)            Gen:  unresponsive, comfort care HEENT: dry mucous membranes CV: Irregular  PULM: diminished, changes in breathing pattern, mouth breathing   SUMMARY OF RECOMMENDATIONS   Continue comfort focused care Will discontinued scheduled Robinul and make available as needed Continued scheduled ativan twice a day  PMT will continue to support and follow on as needed basis. Please secure chat for urgent needs.    Time Total: 25 min.   Visit consisted of counseling and education dealing with the complex and emotionally intense issues of symptom management and palliative care in the setting of serious and potentially life-threatening illness.Greater than 50%  of this time was spent counseling and coordinating care related to the above assessment and plan.  Alda Lea, AGPCNP-BC  Palliative Medicine Team [phone]  Palliative Medicine Team providers are available by phone from 7am to 7pm daily and can  be reached through the team cell phone. Should this patient require assistance outside of these hours, please call the patient's attending physician.

## 2021-06-26 ENCOUNTER — Encounter: Payer: Self-pay | Admitting: Internal Medicine

## 2021-07-11 ENCOUNTER — Telehealth: Payer: Medicare HMO

## 2021-07-18 NOTE — Progress Notes (Signed)
Patient expired at [place] am. Son at bedside, waiting for other family members to arrive. Post mortem care completed and informed day shift nurse of the rest of checklist that needs to be completed.

## 2021-07-18 NOTE — Death Summary Note (Signed)
DEATH SUMMARY   Patient Details  Name: Ricky [name] MRN: [id number] DOB: May 03, 1923  Admission/Discharge Information   Admit Date:  07-01-21  Date of Death: Date of Death: 07/12/2021  Time of Death: Time of Death: 0645  Length of Stay: Dec 14, 2022  Referring Physician: Cassandria Anger, MD   Reason(s) for Hospitalization  Generalized weakness and fall, aspiration pneumonia  Diagnoses  Preliminary cause of death: acute hypoxic respiratory failure secondary to aspiration pneumonia  Secondary Diagnoses (including complications and co-morbidities):  Active Problems:   Fall at home, initial encounter   Fall   Aspiration into airway   Malnutrition of moderate degree   End of life care   Brief Hospital Course (including significant findings, care, treatment, and services provided and events leading to death)  Ricky [name] is Ricky [name] 87 y.o. year old male with Ricky [name] hx atrial fibrillation on eliquis, HFpEF, HTN, hypothyroidism, chronic gait disorder/ambulatory dysfunction who presented to the ED with generalized weakness and Ricky [name] fall.  His hospitalization was complicated by recurrent aspiration events and hypoxia and he was transitioned to comfort measures.  He passed away on 07/13/23 AM.  Called daughter to express condolences.    Pertinent Labs and Studies  Significant Diagnostic Studies DG Chest 1 View  Result Date: 01-Jul-2021 CLINICAL DATA:  Aspiration EXAM: CHEST  1 VIEW COMPARISON:  2021/07/01 FINDINGS: Single frontal view of the chest demonstrates stable dual lead pacer. Cardiac silhouette is unremarkable. Small bilateral pleural effusions are again identified. No acute airspace disease or pneumothorax. No acute bony abnormalities. IMPRESSION: 1. Stable small bilateral pleural effusions. No acute airspace disease. Electronically Signed   By: Randa Ngo M.D.   On: 01-Jul-2021 20:29   DG Chest 2 View  Result Date: 07/01/21 CLINICAL DATA:  Weakness, fall EXAM: CHEST - 2 VIEW COMPARISON:   None. FINDINGS: Right pacer in place with leads in the right atrium and right ventricle. Heart is normal size. Biapical and bibasilar scarring. Small bilateral effusions suspected. No acute confluent opacities. IMPRESSION: Small bilateral effusions. Chronic changes in the lungs. Electronically Signed   By: Rolm Baptise M.D.   On: 07/01/2021 11:38   DG Forearm Left  Result Date: 07/01/21 CLINICAL DATA:  Fall forearm pain EXAM: LEFT FOREARM - 2 VIEW COMPARISON:  None. FINDINGS: There is no evidence of fracture or other focal bone lesions. Soft tissues are unremarkable. IMPRESSION: Negative. Electronically Signed   By: Rolm Baptise M.D.   On: 07-01-21 11:38   CT HEAD WO CONTRAST (5MM)  Result Date: 2021/07/01 CLINICAL DATA:  Level 2 trauma, fall.  On blood thinners. EXAM: CT HEAD WITHOUT CONTRAST TECHNIQUE: Contiguous axial images were obtained from the base of the skull through the vertex without intravenous contrast. COMPARISON:  None. FINDINGS: Brain: There is atrophy and chronic small vessel disease changes. Bilateral basal ganglia calcifications. No acute intracranial abnormality. Specifically, no hemorrhage, hydrocephalus, mass lesion, acute infarction, or significant intracranial injury. Vascular: No hyperdense vessel or unexpected calcification. Skull: No acute calvarial abnormality. Sinuses/Orbits: No acute findings Other: None IMPRESSION: Atrophy, chronic microvascular disease. No acute intracranial abnormality. Electronically Signed   By: Rolm Baptise M.D.   On: 07/01/2021 11:04   CT CERVICAL SPINE WO CONTRAST  Result Date: 2021/07/01 CLINICAL DATA:  Chronic neck pain EXAM: CT CERVICAL SPINE WITHOUT CONTRAST TECHNIQUE: Multidetector CT imaging of the cervical spine was performed without intravenous contrast. Multiplanar CT image reconstructions were also generated. COMPARISON:  None. FINDINGS: Alignment: Alignment is anatomic. Skull base and vertebrae: No acute  fracture. No primary bone  lesion or focal pathologic process. Soft tissues and spinal canal: No prevertebral fluid or swelling. No visible canal hematoma. Disc levels: There is diffuse spondylosis, most pronounced at C5-6 and C6-7. Mild diffuse facet hypertrophy. This results in bilateral neural foraminal encroachment from C3-4 through C6-7, most pronounced at C5-6 and C6-7. Upper chest: Central airway is patent. Biapical pleural and parenchymal scarring. Other: Reconstructed images demonstrate no additional findings. IMPRESSION: 1. Extensive multilevel spondylosis and facet hypertrophy as above, greatest at C5-6 and C6-7. 2. No acute cervical spine fracture. Electronically Signed   By: Randa Ngo M.D.   On: 05/31/2021 16:49   DG Chest Port 1 View  Result Date: 06/14/2021 CLINICAL DATA:  Aspiration into airway. EXAM: PORTABLE CHEST 1 VIEW COMPARISON:  Yesterday FINDINGS: New airspace disease in the left mid to lower chest. Stable small pleural effusions. Stable normal heart size. Dual-chamber pacer from the right with looping of the ventricular lead. IMPRESSION: New infiltrate in the left lung correlating with history of aspiration. Electronically Signed   By: Jorje Guild M.D.   On: 06/14/2021 07:08    Microbiology No results found for this or any previous visit (from the past 240 hour(s)).  Lab Basic Metabolic Panel: No results for input(s): NA, K, CL, CO2, GLUCOSE, BUN, CREATININE, CALCIUM, MG, PHOS in the last 168 hours. Liver Function Tests: No results for input(s): AST, ALT, ALKPHOS, BILITOT, PROT, ALBUMIN in the last 168 hours. No results for input(s): LIPASE, AMYLASE in the last 168 hours. No results for input(s): AMMONIA in the last 168 hours. CBC: No results for input(s): WBC, NEUTROABS, HGB, HCT, MCV, PLT in the last 168 hours. Cardiac Enzymes: No results for input(s): CKTOTAL, CKMB, CKMBINDEX, TROPONINI in the last 168 hours. Sepsis Labs: No results for input(s): PROCALCITON, WBC, LATICACIDVEN in the  last 168 hours.  Procedures/Operations  See prior notes   Fayrene Helper 25-Jun-2021, 8:31 AM

## 2021-07-18 DEATH — deceased

## 2021-11-02 ENCOUNTER — Ambulatory Visit: Payer: Medicare HMO | Admitting: Internal Medicine
